# Patient Record
Sex: Male | Born: 1950 | Race: Black or African American | Hispanic: No | Smoking: Current some day smoker
Health system: Southern US, Community
[De-identification: ages and names within clinical notes are randomized; demographics above are authoritative.]

## PROBLEM LIST (undated history)

## (undated) DIAGNOSIS — G629 Polyneuropathy, unspecified: Secondary | ICD-10-CM

## (undated) DIAGNOSIS — D472 Monoclonal gammopathy: Secondary | ICD-10-CM

## (undated) DIAGNOSIS — K219 Gastro-esophageal reflux disease without esophagitis: Secondary | ICD-10-CM

## (undated) DIAGNOSIS — K759 Inflammatory liver disease, unspecified: Secondary | ICD-10-CM

## (undated) DIAGNOSIS — I10 Essential (primary) hypertension: Secondary | ICD-10-CM

## (undated) DIAGNOSIS — T4145XA Adverse effect of unspecified anesthetic, initial encounter: Secondary | ICD-10-CM

## (undated) DIAGNOSIS — F32A Depression, unspecified: Secondary | ICD-10-CM

## (undated) DIAGNOSIS — J449 Chronic obstructive pulmonary disease, unspecified: Secondary | ICD-10-CM

## (undated) DIAGNOSIS — E7800 Pure hypercholesterolemia, unspecified: Secondary | ICD-10-CM

## (undated) DIAGNOSIS — D649 Anemia, unspecified: Secondary | ICD-10-CM

## (undated) DIAGNOSIS — M1990 Unspecified osteoarthritis, unspecified site: Secondary | ICD-10-CM

## (undated) DIAGNOSIS — F329 Major depressive disorder, single episode, unspecified: Secondary | ICD-10-CM

## (undated) DIAGNOSIS — I82409 Acute embolism and thrombosis of unspecified deep veins of unspecified lower extremity: Secondary | ICD-10-CM

## (undated) DIAGNOSIS — T8859XA Other complications of anesthesia, initial encounter: Secondary | ICD-10-CM

## (undated) HISTORY — DX: Pure hypercholesterolemia, unspecified: E78.00

## (undated) HISTORY — PX: TONSILLECTOMY: SUR1361

## (undated) HISTORY — DX: Essential (primary) hypertension: I10

## (undated) HISTORY — DX: Polyneuropathy, unspecified: G62.9

---

## 2007-06-15 ENCOUNTER — Emergency Department (HOSPITAL_COMMUNITY): Admission: EM | Admit: 2007-06-15 | Discharge: 2007-06-15 | Payer: Self-pay | Admitting: Emergency Medicine

## 2007-08-16 ENCOUNTER — Ambulatory Visit: Payer: Self-pay | Admitting: Nurse Practitioner

## 2007-08-16 ENCOUNTER — Ambulatory Visit: Payer: Self-pay | Admitting: *Deleted

## 2007-08-16 DIAGNOSIS — F1021 Alcohol dependence, in remission: Secondary | ICD-10-CM

## 2007-08-16 DIAGNOSIS — G5790 Unspecified mononeuropathy of unspecified lower limb: Secondary | ICD-10-CM | POA: Insufficient documentation

## 2007-08-16 DIAGNOSIS — L84 Corns and callosities: Secondary | ICD-10-CM

## 2007-08-16 DIAGNOSIS — F17200 Nicotine dependence, unspecified, uncomplicated: Secondary | ICD-10-CM | POA: Insufficient documentation

## 2007-09-16 ENCOUNTER — Ambulatory Visit: Payer: Self-pay | Admitting: Nurse Practitioner

## 2007-10-04 ENCOUNTER — Ambulatory Visit: Payer: Self-pay | Admitting: Nurse Practitioner

## 2007-10-15 ENCOUNTER — Ambulatory Visit: Payer: Self-pay | Admitting: Nurse Practitioner

## 2007-10-15 DIAGNOSIS — F528 Other sexual dysfunction not due to a substance or known physiological condition: Secondary | ICD-10-CM | POA: Insufficient documentation

## 2008-01-13 ENCOUNTER — Ambulatory Visit: Payer: Self-pay | Admitting: Nurse Practitioner

## 2008-01-13 DIAGNOSIS — R609 Edema, unspecified: Secondary | ICD-10-CM | POA: Insufficient documentation

## 2008-04-19 ENCOUNTER — Encounter (INDEPENDENT_AMBULATORY_CARE_PROVIDER_SITE_OTHER): Payer: Self-pay | Admitting: Nurse Practitioner

## 2008-05-30 ENCOUNTER — Ambulatory Visit: Payer: Self-pay | Admitting: Nurse Practitioner

## 2008-07-13 ENCOUNTER — Encounter (INDEPENDENT_AMBULATORY_CARE_PROVIDER_SITE_OTHER): Payer: Self-pay | Admitting: Nurse Practitioner

## 2008-08-03 ENCOUNTER — Telehealth (INDEPENDENT_AMBULATORY_CARE_PROVIDER_SITE_OTHER): Payer: Self-pay | Admitting: Nurse Practitioner

## 2008-08-18 ENCOUNTER — Ambulatory Visit: Payer: Self-pay | Admitting: Nurse Practitioner

## 2008-08-18 DIAGNOSIS — T148XXA Other injury of unspecified body region, initial encounter: Secondary | ICD-10-CM | POA: Insufficient documentation

## 2008-08-30 ENCOUNTER — Ambulatory Visit: Payer: Self-pay | Admitting: Nurse Practitioner

## 2008-09-05 ENCOUNTER — Encounter (INDEPENDENT_AMBULATORY_CARE_PROVIDER_SITE_OTHER): Payer: Self-pay | Admitting: Nurse Practitioner

## 2008-09-05 DIAGNOSIS — B182 Chronic viral hepatitis C: Secondary | ICD-10-CM

## 2008-09-05 LAB — CONVERTED CEMR LAB
ALT: 24 units/L (ref 0–53)
Albumin: 4.3 g/dL (ref 3.5–5.2)
Basophils Absolute: 0 10*3/uL (ref 0.0–0.1)
CO2: 26 meq/L (ref 19–32)
Cholesterol: 177 mg/dL (ref 0–200)
Creatinine, Ser: 1.29 mg/dL (ref 0.40–1.50)
Glucose, Bld: 93 mg/dL (ref 70–99)
HCT: 42.6 % (ref 39.0–52.0)
HCV Ab: REACTIVE — AB
Hep B Core Total Ab: POSITIVE — AB
Hep B E Ab: NEGATIVE
Hep B S Ab: NEGATIVE
Lymphs Abs: 2.3 10*3/uL (ref 0.7–4.0)
MCHC: 32.9 g/dL (ref 30.0–36.0)
Monocytes Absolute: 0.4 10*3/uL (ref 0.1–1.0)
Neutro Abs: 2.3 10*3/uL (ref 1.7–7.7)
Neutrophils Relative %: 44 % (ref 43–77)
Platelets: 227 10*3/uL (ref 150–400)
Potassium: 4.4 meq/L (ref 3.5–5.3)
RBC: 4.7 M/uL (ref 4.22–5.81)
RDW: 13.8 % (ref 11.5–15.5)
Total Bilirubin: 1.1 mg/dL (ref 0.3–1.2)
Total Protein: 7 g/dL (ref 6.0–8.3)

## 2008-09-20 ENCOUNTER — Ambulatory Visit: Payer: Self-pay | Admitting: Nurse Practitioner

## 2008-09-21 ENCOUNTER — Encounter (INDEPENDENT_AMBULATORY_CARE_PROVIDER_SITE_OTHER): Payer: Self-pay | Admitting: Nurse Practitioner

## 2008-09-26 ENCOUNTER — Ambulatory Visit (HOSPITAL_COMMUNITY): Admission: RE | Admit: 2008-09-26 | Discharge: 2008-09-26 | Payer: Self-pay | Admitting: Family Medicine

## 2008-09-26 ENCOUNTER — Encounter (INDEPENDENT_AMBULATORY_CARE_PROVIDER_SITE_OTHER): Payer: Self-pay | Admitting: Nurse Practitioner

## 2008-09-27 ENCOUNTER — Encounter (INDEPENDENT_AMBULATORY_CARE_PROVIDER_SITE_OTHER): Payer: Self-pay | Admitting: Nurse Practitioner

## 2008-09-27 LAB — CONVERTED CEMR LAB: HCV Quantitative: 5890000 intl units/mL — ABNORMAL HIGH (ref <43)

## 2008-10-06 ENCOUNTER — Encounter (INDEPENDENT_AMBULATORY_CARE_PROVIDER_SITE_OTHER): Payer: Self-pay | Admitting: Nurse Practitioner

## 2008-10-26 ENCOUNTER — Encounter (INDEPENDENT_AMBULATORY_CARE_PROVIDER_SITE_OTHER): Payer: Self-pay | Admitting: Nurse Practitioner

## 2008-10-26 ENCOUNTER — Ambulatory Visit: Payer: Self-pay | Admitting: Gastroenterology

## 2008-11-09 ENCOUNTER — Encounter (INDEPENDENT_AMBULATORY_CARE_PROVIDER_SITE_OTHER): Payer: Self-pay | Admitting: Interventional Radiology

## 2008-11-09 ENCOUNTER — Ambulatory Visit (HOSPITAL_COMMUNITY): Admission: RE | Admit: 2008-11-09 | Discharge: 2008-11-09 | Payer: Self-pay | Admitting: Gastroenterology

## 2008-12-14 ENCOUNTER — Encounter (INDEPENDENT_AMBULATORY_CARE_PROVIDER_SITE_OTHER): Payer: Self-pay | Admitting: Nurse Practitioner

## 2008-12-14 ENCOUNTER — Ambulatory Visit: Payer: Self-pay | Admitting: Gastroenterology

## 2008-12-21 ENCOUNTER — Encounter (INDEPENDENT_AMBULATORY_CARE_PROVIDER_SITE_OTHER): Payer: Self-pay | Admitting: Nurse Practitioner

## 2009-01-26 ENCOUNTER — Ambulatory Visit: Payer: Self-pay | Admitting: Nurse Practitioner

## 2009-01-26 DIAGNOSIS — F329 Major depressive disorder, single episode, unspecified: Secondary | ICD-10-CM

## 2009-01-26 DIAGNOSIS — F3289 Other specified depressive episodes: Secondary | ICD-10-CM | POA: Insufficient documentation

## 2009-02-01 ENCOUNTER — Telehealth (INDEPENDENT_AMBULATORY_CARE_PROVIDER_SITE_OTHER): Payer: Self-pay | Admitting: *Deleted

## 2009-02-05 ENCOUNTER — Encounter (INDEPENDENT_AMBULATORY_CARE_PROVIDER_SITE_OTHER): Payer: Self-pay | Admitting: Nurse Practitioner

## 2009-06-22 ENCOUNTER — Ambulatory Visit: Payer: Self-pay | Admitting: Nurse Practitioner

## 2009-06-22 ENCOUNTER — Telehealth (INDEPENDENT_AMBULATORY_CARE_PROVIDER_SITE_OTHER): Payer: Self-pay | Admitting: Nurse Practitioner

## 2009-06-27 ENCOUNTER — Ambulatory Visit: Payer: Self-pay | Admitting: Nurse Practitioner

## 2009-06-27 DIAGNOSIS — M25569 Pain in unspecified knee: Secondary | ICD-10-CM | POA: Insufficient documentation

## 2009-06-27 DIAGNOSIS — R252 Cramp and spasm: Secondary | ICD-10-CM

## 2009-06-28 ENCOUNTER — Telehealth (INDEPENDENT_AMBULATORY_CARE_PROVIDER_SITE_OTHER): Payer: Self-pay | Admitting: Nurse Practitioner

## 2009-07-16 ENCOUNTER — Telehealth (INDEPENDENT_AMBULATORY_CARE_PROVIDER_SITE_OTHER): Payer: Self-pay | Admitting: *Deleted

## 2009-07-25 ENCOUNTER — Encounter (INDEPENDENT_AMBULATORY_CARE_PROVIDER_SITE_OTHER): Payer: Self-pay | Admitting: Nurse Practitioner

## 2009-10-26 ENCOUNTER — Emergency Department (HOSPITAL_COMMUNITY)
Admission: EM | Admit: 2009-10-26 | Discharge: 2009-10-26 | Payer: Self-pay | Source: Home / Self Care | Admitting: Family Medicine

## 2009-10-29 ENCOUNTER — Emergency Department (HOSPITAL_COMMUNITY): Admission: EM | Admit: 2009-10-29 | Discharge: 2009-10-29 | Payer: Self-pay | Admitting: Family Medicine

## 2009-10-31 ENCOUNTER — Encounter (HOSPITAL_BASED_OUTPATIENT_CLINIC_OR_DEPARTMENT_OTHER): Admission: RE | Admit: 2009-10-31 | Discharge: 2010-01-29 | Payer: Self-pay | Admitting: Internal Medicine

## 2009-11-02 ENCOUNTER — Ambulatory Visit (HOSPITAL_COMMUNITY): Admission: RE | Admit: 2009-11-02 | Discharge: 2009-11-02 | Payer: Self-pay | Admitting: Internal Medicine

## 2009-11-13 ENCOUNTER — Ambulatory Visit: Payer: Self-pay | Admitting: Vascular Surgery

## 2010-03-05 ENCOUNTER — Emergency Department (HOSPITAL_COMMUNITY): Admission: EM | Admit: 2010-03-05 | Discharge: 2010-03-05 | Payer: Self-pay | Admitting: Emergency Medicine

## 2010-05-09 ENCOUNTER — Telehealth (INDEPENDENT_AMBULATORY_CARE_PROVIDER_SITE_OTHER): Payer: Self-pay | Admitting: Nurse Practitioner

## 2010-05-09 ENCOUNTER — Ambulatory Visit: Payer: Self-pay | Admitting: Nurse Practitioner

## 2010-05-09 DIAGNOSIS — B351 Tinea unguium: Secondary | ICD-10-CM | POA: Insufficient documentation

## 2010-05-09 LAB — CONVERTED CEMR LAB
Bilirubin Urine: NEGATIVE
Glucose, Urine, Semiquant: NEGATIVE
Nitrite: NEGATIVE
OCCULT 1: NEGATIVE
Protein, U semiquant: NEGATIVE
Rapid HIV Screen: NEGATIVE

## 2010-05-13 LAB — CONVERTED CEMR LAB
ALT: 116 units/L — ABNORMAL HIGH (ref 0–53)
AST: 85 units/L — ABNORMAL HIGH (ref 0–37)
Albumin: 4.6 g/dL (ref 3.5–5.2)
BUN: 16 mg/dL (ref 6–23)
Basophils Absolute: 0 10*3/uL (ref 0.0–0.1)
Basophils Relative: 0 % (ref 0–1)
CO2: 28 meq/L (ref 19–32)
Creatinine, Ser: 1.28 mg/dL (ref 0.40–1.50)
Eosinophils Absolute: 0.4 10*3/uL (ref 0.0–0.7)
Eosinophils Relative: 5 % (ref 0–5)
HCT: 45.3 % (ref 39.0–52.0)
Lymphocytes Relative: 50 % — ABNORMAL HIGH (ref 12–46)
MCHC: 34.2 g/dL (ref 30.0–36.0)
MCV: 89.5 fL (ref 78.0–100.0)
Monocytes Relative: 7 % (ref 3–12)
Neutrophils Relative %: 38 % — ABNORMAL LOW (ref 43–77)
PSA: 1.71 ng/mL (ref <=4.00)
Potassium: 4.1 meq/L (ref 3.5–5.3)
RDW: 14 % (ref 11.5–15.5)
Sodium: 143 meq/L (ref 135–145)

## 2010-05-20 ENCOUNTER — Telehealth (INDEPENDENT_AMBULATORY_CARE_PROVIDER_SITE_OTHER): Payer: Self-pay | Admitting: Nurse Practitioner

## 2010-05-21 ENCOUNTER — Ambulatory Visit: Payer: Self-pay | Admitting: Nurse Practitioner

## 2010-05-21 DIAGNOSIS — M25519 Pain in unspecified shoulder: Secondary | ICD-10-CM | POA: Insufficient documentation

## 2010-05-27 ENCOUNTER — Ambulatory Visit: Payer: Self-pay | Admitting: Nurse Practitioner

## 2010-05-27 DIAGNOSIS — K219 Gastro-esophageal reflux disease without esophagitis: Secondary | ICD-10-CM | POA: Insufficient documentation

## 2010-06-04 ENCOUNTER — Ambulatory Visit
Admission: RE | Admit: 2010-06-04 | Discharge: 2010-06-04 | Payer: Self-pay | Source: Home / Self Care | Attending: Nurse Practitioner | Admitting: Nurse Practitioner

## 2010-06-05 ENCOUNTER — Encounter
Admission: RE | Admit: 2010-06-05 | Discharge: 2010-07-09 | Payer: Self-pay | Source: Home / Self Care | Attending: Internal Medicine | Admitting: Internal Medicine

## 2010-06-05 ENCOUNTER — Encounter (INDEPENDENT_AMBULATORY_CARE_PROVIDER_SITE_OTHER): Payer: Self-pay | Admitting: Nurse Practitioner

## 2010-06-21 ENCOUNTER — Ambulatory Visit
Admission: RE | Admit: 2010-06-21 | Discharge: 2010-06-21 | Payer: Self-pay | Source: Home / Self Care | Attending: Nurse Practitioner | Admitting: Nurse Practitioner

## 2010-06-30 ENCOUNTER — Encounter: Payer: Self-pay | Admitting: Family Medicine

## 2010-07-03 ENCOUNTER — Encounter: Payer: Self-pay | Admitting: Physical Therapy

## 2010-07-07 LAB — CONVERTED CEMR LAB
ALT: 26 units/L (ref 0–53)
AST: 27 units/L (ref 0–37)
Albumin: 4.3 g/dL (ref 3.5–5.2)
BUN: 14 mg/dL (ref 6–23)
Basophils Absolute: 0 10*3/uL (ref 0.0–0.1)
Basophils Relative: 1 % (ref 0–1)
CO2: 26 meq/L (ref 19–32)
Chloride: 110 meq/L (ref 96–112)
Eosinophils Absolute: 0.5 10*3/uL (ref 0.0–0.7)
Eosinophils Relative: 8 % — ABNORMAL HIGH (ref 0–5)
Glucose, Bld: 94 mg/dL (ref 70–99)
Hemoglobin: 15.6 g/dL (ref 13.0–17.0)
Neutro Abs: 3.2 10*3/uL (ref 1.7–7.7)
Neutrophils Relative %: 52 % (ref 43–77)
PSA: 2.04 ng/mL (ref 0.10–4.00)
Potassium: 4.4 meq/L (ref 3.5–5.3)
Saturation Ratios: 37 % (ref 20–55)
Sodium: 145 meq/L (ref 135–145)
Total Bilirubin: 1.1 mg/dL (ref 0.3–1.2)
Triglycerides: 134 mg/dL (ref <150)
UIBC: 186 ug/dL

## 2010-07-10 NOTE — Progress Notes (Signed)
Summary: medication refills  Phone Note Call from Patient   Summary of Call: pt is asking if he can get a Rx for vit b6, vit b12, and a multivitamin. He says he takes daily and wanted to know if it would be cheaper to get a [place] pharmacy. Initial call taken by: Levon Hedger,  June 22, 2009 12:05 PM  Follow-up for Phone Call        Will send RX for multivitamin to [facility] pharmacy. He will still need to get the others over the counter, however the multivitamin should include a daily dose of vitamin B6 and 12.  Follow-up by: Lehman Prom FNP,  June 22, 2009 12:20 PM  Additional Follow-up for Phone Call Additional follow up Details #1::        Pt informed of above information. Additional Follow-up by: Levon Hedger,  June 26, 2009 3:35 PM    Prescriptions: MULTIVITAMINS   TABS (MULTIPLE VITAMIN) 1 tablet by mouth daily  #30 x 11   Entered and Authorized by:   Lehman Prom FNP   Signed by:   Lehman Prom FNP on 06/22/2009   Method used:   Faxed to ...       [facility] (retail)       [address] [place], [place]  [address]       Ph: [phone]       Fax: [phone]   RxID:   [id number]
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
## 2010-07-10 NOTE — Progress Notes (Signed)
Summary: Form  Phone Note Call from Patient Call back at Home Phone [phone]   Summary of Call: Mr. Birdsall stopped by today and dropped off a form for the provider to filled out ([facility] Michel Bickers FNP  Initial call taken by: Manon Hilding,  July 16, 2009 3:24 PM  Follow-up for Phone Call        PUT IN YOUR REFILL SLOT Follow-up by: Arta Bruce,  July 16, 2009 3:29 PM  Additional Follow-up for Phone Call Additional follow up Details #1::        notify pt that form is completed make copy for the chart original to pt or it can be faxed back Additional Follow-up by: Lehman Prom FNP,  July 24, 2009 3:43 PM    Additional Follow-up for Phone Call Additional follow up Details #2::    CALLED PT FOR PICK UP Follow-up by: Arta Bruce,  July 25, 2009 9:47 AM

## 2010-07-10 NOTE — Progress Notes (Signed)
Summary: requesting refill  Phone Note Call from Patient Call back at [phone]   Summary of Call: Thomas Mcneil calling stating that yesterday Thomas Mcneil didn't write anything for his sinus problems. He stated that he talked about it with provider but he realized he only had 2 rx's. He statest he uses [employer] and he can be reached at [phone]. Initial call taken by: Mikey College CMA,  June 28, 2009 9:27 AM  Follow-up for Phone Call        pt is absolutely correct. I forgot. Rx for allegra has been sent to the [facility] pharmacy advise pt to call to check when it will be ready Follow-up by: Lehman Prom FNP,  June 28, 2009 10:27 AM  Additional Follow-up for Phone Call Additional follow up Details #1::        pt informed of above information. Additional Follow-up by: Levon Hedger,  June 28, 2009 10:40 AM    New/Updated Medications: ALLEGRA 180 MG TABS (FEXOFENADINE HCL) One tablet by mouth daily for allergies Prescriptions: ALLEGRA 180 MG TABS (FEXOFENADINE HCL) One tablet by mouth daily for allergies  #30 x 3   Entered and Authorized by:   Lehman Prom FNP   Signed by:   Lehman Prom FNP on 06/28/2009   Method used:   Faxed to ...       [facility] (retail)       [address] [place], [place]  [address]       Ph: [phone]       Fax: [phone]   RxID:   [id number]

## 2010-07-10 NOTE — Letter (Signed)
Summary: MENTAL RESIDUAL FUNCTIONAL CAPACITY FORM  MENTAL RESIDUAL FUNCTIONAL CAPACITY FORM   Imported By: Arta Bruce 07/25/2009 09:34:57  _____________________________________________________________________  External Attachment:    Type:   Image     Comment:   External Document

## 2010-07-10 NOTE — Letter (Signed)
Summary: PHYSICAL RESIDUAL FUNCTIONAL CAPACITY FORM  PHYSICAL RESIDUAL FUNCTIONAL CAPACITY FORM   Imported By: Arta Bruce 07/25/2009 09:38:17  _____________________________________________________________________  External Attachment:    Type:   Image     Comment:   External Document

## 2010-07-10 NOTE — Progress Notes (Signed)
Summary: Referrals  Phone Note Outgoing Call   Summary of Call: Pt needs 3 referrals: -Podiatry -Medical Specialty -GI for colonscopy Printed referrals  Initial call taken by: Lehman Prom FNP,  May 09, 2010 4:31 PM  Follow-up for Phone Call        1. PODIATRY: I SCHEDULE AN APPT 05-16-10 @ 3:30PM [facility] MAYER  2.MEDICAL SPECIALTY SEND REFERRAL WAITING FOR AN APPT. 3. GI SEND REFERRAL TO [facility] THEY WILL CALL THE PT TO SCHEDULE AN APPT Follow-up by: Cheryll Dessert,  May 10, 2010 4:26 PM

## 2010-07-10 NOTE — Assessment & Plan Note (Signed)
Summary: Peripheral Neuropathy   Vital Signs:  Patient profile:   60 year old male Weight:      209.8 pounds BMI:     28.56 BSA:     2.18 Temp:     97.8 degrees F oral Pulse rate:   93 / minute Pulse rhythm:   regular Resp:     20 per minute BP sitting:   127 / 81  (left arm) Cuff size:   regular  Vitals Entered By: Levon Hedger (June 27, 2009 2:26 PM) CC: sinus issues having headaches....pain in joints and knees has alot of pain squating and climbing stairs, Depression Is Patient Diabetic? No Pain Assessment Patient in pain? no       Does patient need assistance? Functional Status Self care Ambulation Normal  Diabetic Foot Exam Foot Inspection Are the toenails long?                Yes Are the toenails thick?                Yes  Diabetic Foot Care Education Pulse Check          Right Foot          Left Foot Dorsalis Pedis:        diminished            diminished    Primary Care Provider:  Lehman Prom  CC:  sinus issues having headaches....pain in joints and knees has alot of pain squating and climbing stairs and Depression.  History of Present Illness:  Pt into the office for follow up.  Pt has some questions about his multivitamin.  He has been ETOH free from 1 year and 4 months.  He was started on B6 and B12 and has been taking it for 12 months.  He is also taking a multivitamin Tobacco use - quit about 6 weeks ago  Cramps increasing in frequency.  starts from buttocks down to his toes.  Usually only at night. Pain in both his legs that has been increasing.  Never with ambulation or in calf muscles  Knees - aches.  If he walks up steps or hills then his knees hurt. Weight gain of 10 pounds since his last visit which has increased the pain in his knees.  Allergy symptoms -  +sneezing +nasal congestions +slight headache (but needs an eye exam) Used nasal spray in the past without resolution Comments:  still going to [facility].    Habits & Providers  Alcohol-Tobacco-Diet     Alcohol drinks/day: <1     Alcohol Counseling: to STOP drinking     Alcohol type: alcohol     Tobacco Status: quit < 6 months     Tobacco Counseling: to quit use of tobacco products  Exercise-Depression-Behavior     Does Patient Exercise: no     Type of exercise: ride bike     Drug Use: no     Sun Exposure: occasionally  Medications Prior to Update: 1)  Neurontin 300 Mg  Caps (Gabapentin) .... 2 Tablet By Mouth Two Times A Day 2)  Vitamin B-6 100 Mg  Tabs (Pyridoxine Hcl) .Marland Kitchen.. 1 Tablet By Mouth Daily 3)  Multivitamins   Tabs (Multiple Vitamin) .Marland Kitchen.. 1 Tablet By Mouth Daily 4)  Aspirin Ec Lo-Dose 81 Mg  Tbec (Aspirin) .Marland Kitchen.. 1 Tablet By Mouth Daily For Circulation 5)  Viagra 100 Mg  Tabs (Sildenafil Citrate) .Marland Kitchen.. 1 Tablet By Mouth 30 Minutes To Sexual  Activity 6)  Ultram 50 Mg  Tabs (Tramadol Hcl) .Marland Kitchen.. 1 Tablet By Mouth Two Times A Day As Needed For Pain 7)  Lexapro 10 Mg Tabs (Escitalopram Oxalate) .Marland Kitchen.. 1 Tablet By Mouth Daily  **rx By [facility]** 8)  Trazodone Hcl 100 Mg Tabs (Trazodone Hcl) .... One Tablet By Mouth At Bedtime **rx By [facility]**  Allergies (verified): No Known Drug Allergies  Social History: Smoking Status:  quit < 6 months  Review of Systems General:  Denies fever. ENT:  Complains of postnasal drainage. CV:  Complains of swelling of feet; denies chest pain or discomfort, leg cramps with exertion, and palpitations. Resp:  Denies cough. Neuro:  Complains of headaches.  Physical Exam  General:  alert.   Eyes:  glasses Lungs:  normal breath sounds.   Heart:  normal rate and regular rhythm.   Neurologic:  alert & oriented X3.     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 will continue gabapentin start lortab as needed   Problem # 2:  KNEE PAIN, BILATERAL  (ICD-719.46) anti-inflammatories. also increased due to weight gain The following medications were removed from the medication list:    Ultram 50 Mg Tabs (Tramadol hcl) .Marland Kitchen... 1 tablet by mouth two times a day as needed for pain His updated medication list for this problem includes:    Aspirin Ec Lo-dose 81 Mg Tbec (Aspirin) .Marland Kitchen... 1 tablet by mouth daily for circulation    Diclofenac Sodium 75 Mg Tbec (Diclofenac sodium) ..... One tablet by mouth two times a day for knees *take with food**    Lortab 5 5-500 Mg Tabs (Hydrocodone-acetaminophen) ..... One tablet by mouth nighty as needed for leg pain  Problem # 3:  Hx of TOBACCO ABUSE (ICD-305.1) congrats on cessation  Complete Medication List: 1)  Neurontin 300 Mg Caps (Gabapentin) .... 2 tablet by mouth two times a day 2)  Vitamin B-6 100 Mg Tabs (Pyridoxine hcl) .Marland Kitchen.. 1 tablet by mouth daily 3)  Multivitamins Tabs (Multiple vitamin) .Marland Kitchen.. 1 tablet by mouth daily 4)  Aspirin Ec Lo-dose 81 Mg Tbec (Aspirin) .Marland Kitchen.. 1 tablet by mouth daily for circulation 5)  Viagra 100 Mg Tabs (Sildenafil citrate) .Marland Kitchen.. 1 tablet by mouth 30 minutes to sexual activity 6)  Lexapro 10 Mg Tabs (Escitalopram oxalate) .Marland Kitchen.. 1 tablet by mouth daily  **rx by [facility]** 7)  Trazodone Hcl 100 Mg Tabs (Trazodone hcl) .... One tablet by mouth at bedtime **rx by [facility]** 8)  Diclofenac Sodium 75 Mg Tbec (Diclofenac sodium) .... One tablet by mouth two times a day for knees *take with food** 9)  Lortab 5 5-500 Mg Tabs (Hydrocodone-acetaminophen) .... One tablet by mouth nighty as needed for leg pain  Patient Instructions: 1)  Congrats on quitting smoking. 2)  [facility] [place] [phone] OR 3)  [facility] [phone] 4)  Knees - arthritis.  Start diclofenac 75mg  by mouth two times a day for knees.  Take with food. 5)  Neuropathy - continue to take the gabapentin at night. 6)  May take lortab as needed for pain (extreme) Prescriptions: DICLOFENAC  SODIUM 75 MG TBEC (DICLOFENAC SODIUM) One tablet by mouth two times a day for knees *Take with food**  #60 x 3   Entered and Authorized by:   Lehman Prom FNP   Signed by:   Lehman Prom FNP on 06/27/2009   Method used:  Print then Give to Patient   RxID:   [id number] LORTAB 5 5-500 MG TABS (HYDROCODONE-ACETAMINOPHEN) One tablet by mouth nighty as needed for leg pain  #30 x 0   Entered and Authorized by:   Lehman Prom FNP   Signed by:   Lehman Prom FNP on 06/27/2009   Method used:   Print then Give to Patient   RxID:   [id number]

## 2010-07-11 ENCOUNTER — Ambulatory Visit: Payer: Self-pay | Admitting: Physical Therapy
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
## 2010-07-11 NOTE — Assessment & Plan Note (Signed)
Summary: assess abdominal pain//[facility]  Nurse Visit   Vital Signs:  Patient profile:   60 year old male Weight:      200.2 pounds Temp:     97.1 degrees F oral Pulse rate:   72 / minute Pulse rhythm:   regular Resp:     20 per minute BP sitting:   146 / 84  (left arm) Cuff size:   regular  Vitals Entered By: Dutch Quint RN (June 04, 2010 9:17 AM)  Patient Instructions: 1)  Continue taking medications and following diet for your reflux. 2)  It's good that you've stopped drinking. 3)  Return in two weeks to see triage nurse to recheck abdominal pain. 4)  Keep schedule appointment in March with your Jeilyn Reznik. 5)  Call if anything changes of if you have any questions.   Primary Care Jahlil Ziller:  Lehman Prom  CC:  reassess abdominal pain.  History of Present Illness: 05/27/10 - f/u abdominal pain.  dx with GERD, started on protonix.  Started taking protonix the same day, has been taking daily since.  Has been effective.  No reoccurrence, has been following diet. States has stopped drinking.   Review of Systems       States he is always tired, energy level is down. GI:  States asymptomatic.Marland Kitchen   Physical Exam  General:  alert, well-developed, well-nourished, and well-hydrated.     Impression & Recommendations:  Problem # 1:  GERD (ICD-530.81) No further c/o abdominal pain Has stopped drinking Taking protonix Return in 2 weeks with triage nurse to reassess abdominal pain  His updated medication list for this problem includes:    Pantoprazole Sodium 40 Mg Tbec (Pantoprazole sodium) ..... One tablet by mouth daily before breakfast  Complete Medication List: 1)  Neurontin 300 Mg Caps (Gabapentin) .... 2 tablet by mouth two times a day 2)  Vitamin B-6 100 Mg Tabs (Pyridoxine hcl) .Marland Kitchen.. 1 tablet by mouth daily 3)  Multivitamins Tabs (Multiple vitamin) .Marland Kitchen.. 1 tablet by mouth daily 4)  Aspirin Ec Lo-dose 81 Mg Tbec (Aspirin) .Marland Kitchen.. 1 tablet by mouth daily for  circulation 5)  Lexapro 10 Mg Tabs (Escitalopram oxalate) .Marland Kitchen.. 1 tablet by mouth daily  **rx by [facility]** 6)  Trazodone Hcl 100 Mg Tabs (Trazodone hcl) .... One tablet by mouth at bedtime **rx by [facility]** 7)  Xyzal 5 Mg Tabs (Levocetirizine dihydrochloride) .... One tablet by mouth daily for allergies 8)  Naproxen 500 Mg Tabs (Naproxen) .... One tablet by mouth two times a day for pain 9)  Pantoprazole Sodium 40 Mg Tbec (Pantoprazole sodium) .... One tablet by mouth daily before breakfast  CC: reassess abdominal pain Is Patient Diabetic? No Pain Assessment Patient in pain? no       Does patient need assistance? Functional Status Self care Ambulation Normal   Allergies: No Known Drug Allergies  Orders Added: 1)  Est. Patient Level I [04540]

## 2010-07-11 NOTE — Assessment & Plan Note (Signed)
Summary: Acute - Gastritis   Vital Signs:  Patient profile:   60 year old male Weight:      201.56 pounds Temp:     97.3 degrees F oral Pulse rate:   80 / minute Pulse rhythm:   regular Resp:     16 per minute BP sitting:   138 / 84  (left arm) Cuff size:   regular  Vitals Entered By: Hale Drone CMA (May 27, 2010 3:38 PM) CC: Here for a f/u from [facility] urgent care. Stomach pains. Was diagnosed w/gastroenteritis. Out of meds. and would like more. , Abdominal Pain, Depression Is Patient Diabetic? No Pain Assessment Patient in pain? yes     Location: abdomen Intensity: 2 Type: dull/aching Onset of pain  Intermittent  Does patient need assistance? Functional Status Self care Ambulation Normal   Primary Care Provider:  Lehman Prom  CC:  Here for a f/u from [facility] urgent care. Stomach pains. Was diagnosed w/gastroenteritis. Out of meds. and would like more. , Abdominal Pain, and Depression.  History of Present Illness:  Pt into the office with c/o abdominal pain Reports that he started drinking a few month back (which he admitted to on last visit) Pt has since quit drinking He was given meds from Urgent care but he does not know the name of the medication.    The patient denies that he feels like life is not worth living, denies that he wishes that he were dead, and denies that he has thought about ending his life.         Dyspepsia History:      He has no alarm features of dyspepsia including no history of melena, hematochezia, dysphagia, persistent vomiting, or involuntary weight loss > 5%.  There is no prior history of GERD.  The patient does not have a prior history of documented ulcer disease.  The dominant symptom is heartburn or acid reflux.  An H-2 blocker medication is not currently being taken.  He has no history of a positive H. Pylori serology.  No previous upper endoscopy has been done.      Habits & Providers  Alcohol-Tobacco-Diet     Alcohol drinks/day:  <1     Alcohol Counseling: to STOP drinking     Alcohol type: alcohol     Tobacco Status: quit < 6 months     Tobacco Counseling: to quit use of tobacco products  Exercise-Depression-Behavior     Does Patient Exercise: no     Type of exercise: ride bike     Have you felt down or hopeless? yes     Have you felt little pleasure in things? yes     Depression Counseling: further diagnostic testing and/or other treatment is indicated     Drug Use: no     Sun Exposure: occasionally  Current Medications (verified): 1)  Neurontin 300 Mg  Caps (Gabapentin) .... 2 Tablet By Mouth Two Times A Day 2)  Vitamin B-6 100 Mg  Tabs (Pyridoxine Hcl) .Marland Kitchen.. 1 Tablet By Mouth Daily 3)  Multivitamins   Tabs (Multiple Vitamin) .Marland Kitchen.. 1 Tablet By Mouth Daily 4)  Aspirin Ec Lo-Dose 81 Mg  Tbec (Aspirin) .Marland Kitchen.. 1 Tablet By Mouth Daily For Circulation 5)  Lexapro 10 Mg Tabs (Escitalopram Oxalate) .Marland Kitchen.. 1 Tablet By Mouth Daily  **rx By [facility]** 6)  Trazodone Hcl 100 Mg Tabs (Trazodone Hcl) .... One Tablet By Mouth At Bedtime **rx By [facility]** 7)  Xyzal 5 Mg Tabs (  Levocetirizine Dihydrochloride) .... One Tablet By Mouth Daily For Allergies 8)  Naproxen 500 Mg Tabs (Naproxen) .... One Tablet By Mouth Two Times A Day For Pain  Allergies (verified): No Known Drug Allergies  Review of Systems CV:  Denies chest pain or discomfort. Resp:  Denies cough. GI:  Complains of abdominal pain and indigestion; denies nausea and vomiting.  Physical Exam  General:  alert.   Head:  normocephalic.   Eyes:  glasses Lungs:  normal breath sounds.   Heart:  normal rate and regular rhythm.   Abdomen:  midepigastric tenderness Msk:  normal ROM.   Neurologic:  alert & oriented X3.     Impression & Recommendations:  Problem # 1:  GERD (ICD-530.81)  handout given to pt will start protonix  His updated medication list for this problem includes:    Pantoprazole Sodium 40 Mg Tbec (Pantoprazole sodium) .....  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 He went 3 years sober and recently had a relapse but pt acknowledges the need for cessation  Complete Medication List: 1)  Neurontin 300 Mg Caps (Gabapentin) .... 2 tablet by mouth two times a day 2)  Vitamin B-6 100 Mg Tabs (Pyridoxine hcl) .Marland Kitchen.. 1 tablet by mouth daily 3)  Multivitamins Tabs (Multiple vitamin) .Marland Kitchen.. 1 tablet by mouth daily 4)  Aspirin Ec Lo-dose 81 Mg Tbec (Aspirin) .Marland Kitchen.. 1 tablet by mouth daily for circulation 5)  Lexapro 10 Mg Tabs (Escitalopram oxalate) .Marland Kitchen.. 1 tablet by mouth daily  **rx by [facility]** 6)  Trazodone Hcl 100 Mg Tabs (Trazodone hcl) .... One tablet by mouth at bedtime **rx by [facility]** 7)  Xyzal 5 Mg Tabs (Levocetirizine dihydrochloride) .... One tablet by mouth daily for allergies 8)  Naproxen 500 Mg Tabs (Naproxen) .... One tablet by mouth two times a day for pain 9)  Pantoprazole Sodium 40 Mg Tbec (Pantoprazole sodium) .... One tablet by mouth daily before breakfast  Patient Instructions: 1)  Read the handout to find out foods to avoid 2)  Start the protonix 40mg  by mouth daily before breakfast. 3)  Take this medication before food or your other medications. 4)  Keep your appointment for therapy on December 28th at 1:30PM 5)  Follow up in 1 week with Triage nurse - assess abdominal pain. Prescriptions: PANTOPRAZOLE SODIUM 40 MG TBEC (PANTOPRAZOLE SODIUM) One tablet by mouth daily before breakfast  #30 x 3   Entered and Authorized by:   Lehman Prom FNP   Signed by:   Lehman Prom FNP on 05/27/2010   Method used:   Print then Give to Patient   RxID:   [id number] PANTOPRAZOLE SODIUM 40 MG TBEC (PANTOPRAZOLE SODIUM) One tablet by mouth daily before breakfast  #30 x 3   Entered and Authorized by:   Lehman Prom FNP   Signed by:   Lehman Prom FNP on 05/27/2010   Method  used:   Print then Give to Patient   RxID:   [id number]    Orders Added: 1)  Est. Patient Level III [95284]

## 2010-07-11 NOTE — Assessment & Plan Note (Signed)
Summary: 2 WEEK FU FOR ABDOMIONAL PAIN/KT  Nurse Visit   Vital Signs:  Patient profile:   60 year old male Weight:      192.3 pounds Temp:     98.3 degrees F oral Pulse rate:   96 / minute Pulse rhythm:   regular Resp:     20 per minute BP sitting:   118 / 80  (left arm) Cuff size:   regular  Vitals Entered By: Dutch Quint RN (June 21, 2010 2:25 PM)  Patient Instructions: 1)  Continue to take protonix as ordered - glad you're doing so well. 2)  Continue monitoring diet and lifestyle changes. 3)  Return in one month for follow-up with triage nurse to assess your stomach pains. 4)  Call if anything changes or if you have any questions.  CC: f/u stomach pain Is Patient Diabetic? No Pain Assessment Patient in pain? no       Does patient need assistance? Functional Status Self care Ambulation Normal   Primary Care Provider:  Lehman Prom  CC:  f/u stomach pain.  History of Present Illness: 12/19 dx with GERD, started protonix, f/u 12/27, was doing well, had stopped drinking.  Continuing protonix as ordered, states no more stomach/abdominal pain.  Smokes cigars once in a while, cut back on junk food, trying to avoid spicy foods, no caffeine.  Not drinking.     Review of Systems GI:  States asymptomatic, denies reflux.Marland Kitchen   Physical Exam  General:  alert, well-developed, well-nourished, and well-hydrated.  Has lost 8 lbs. since last visit.   Impression & Recommendations:  Problem # 1:  GERD (ICD-530.81) Denies pain Still not drinking Monitoring diet, limiting contributing foods/liquids Has lost 8 lbs. Return in one month with triage nurse for reassessment.  His updated medication list for this problem includes:    Pantoprazole Sodium 40 Mg Tbec (Pantoprazole sodium) ..... One tablet by mouth daily before breakfast  Complete Medication List: 1)  Neurontin 300 Mg Caps (Gabapentin) .... 2 tablet by mouth two times a day 2)  Vitamin B-6 100 Mg Tabs  (Pyridoxine hcl) .Marland Kitchen.. 1 tablet by mouth daily 3)  Multivitamins Tabs (Multiple vitamin) .Marland Kitchen.. 1 tablet by mouth daily 4)  Aspirin Ec Lo-dose 81 Mg Tbec (Aspirin) .Marland Kitchen.. 1 tablet by mouth daily for circulation 5)  Lexapro 10 Mg Tabs (Escitalopram oxalate) .Marland Kitchen.. 1 tablet by mouth daily  **rx by [facility]** 6)  Trazodone Hcl 100 Mg Tabs (Trazodone hcl) .... One tablet by mouth at bedtime **rx by [facility]** 7)  Xyzal 5 Mg Tabs (Levocetirizine dihydrochloride) .... One tablet by mouth daily for allergies 8)  Naproxen 500 Mg Tabs (Naproxen) .... One tablet by mouth two times a day for pain 9)  Pantoprazole Sodium 40 Mg Tbec (Pantoprazole sodium) .... One tablet by mouth daily before breakfast   Allergies: No Known Drug Allergies  Orders Added: 1)  Est. Patient Level I [56213]

## 2010-07-11 NOTE — Letter (Signed)
Summary: Handout Printed  Printed Handout:  - Tendinitis 

## 2010-07-11 NOTE — Progress Notes (Signed)
Summary: need to speak with you  Phone Note Call from Patient   Summary of Call: please call patient he is calling today because his left shoulder got in a lot of pain he ask for an appointment but we don't have availiable appointment he is asking to see [facility] soon. please call him at [phone] Initial call taken by: Domenic Polite,  May 20, 2010 2:20 PM  Follow-up for Phone Call        Thomas Mcneil CALLED THIS MORNING AND GOT AN APPT TO COME IN TODAY. Follow-up by: Leodis Rains,  May 21, 2010 10:08 AM

## 2010-07-11 NOTE — Miscellaneous (Signed)
Summary: Rehab Report//INITIAL SUMMARY  Rehab Report//INITIAL SUMMARY   Imported By: Arta Bruce 06/21/2010 10:27:14  _____________________________________________________________________  External Attachment:    Type:   Image     Comment:   External Document

## 2010-07-15 ENCOUNTER — Encounter: Payer: Self-pay | Admitting: Physical Therapy

## 2010-07-23 ENCOUNTER — Encounter (INDEPENDENT_AMBULATORY_CARE_PROVIDER_SITE_OTHER): Payer: Self-pay | Admitting: Nurse Practitioner

## 2010-07-23 ENCOUNTER — Encounter: Payer: Self-pay | Admitting: Nurse Practitioner

## 2010-07-30 NOTE — Progress Notes (Signed)
  NAME:  Thomas Mcneil, Thomas Mcneil                 ACCOUNT NO.:  [id number]  MEDICAL RECORD NO.:  [id number]          PATIENT TYPE:  REC  LOCATION:  [facility]                         FACILITY:  [facility]  PHYSICIAN:  Ifeanyichukwu Wickham G. Renard Matter, MD   DATE OF BIRTH:  09/25/1950  DATE OF PROCEDURE:  07/22/2010 DATE OF DISCHARGE:  07/09/2010                                PROGRESS NOTE   SUBJECTIVE:  This patient was admitted with exacerbation of COPD.  He remains on BiPAP.  He is on antibiotics and steroids and inhaled bronchodilators.  OBJECTIVE:  VITAL SIGNS:  Blood pressure 143/69, respirations 26, pulse 86, temperature 97.0.  Current blood gases:  PH 7.312 with a pCO2 of 72.2, and pO2 60.7.  LUNGS:  Rhonchi bilaterally.  HEART:  Regular rhythm.  ABDOMEN:  No palpable organs or masses.  ASSESSMENT:  The patient was admitted to the hospital with exacerbation of chronic obstructive pulmonary disease.  Does have type 2 diabetes, acute maxillary sinusitis, and hyperlipidemia.  PLAN:  To continue current regimen.  The patient to remain on BiPAP. Continue IV steroids, Rocephin, and Zithromax intravenously.     Elmire Amrein G. Renard Matter, MD     AGM/MEDQ  D:  07/22/2010  T:  07/22/2010  Job:  [id number]  Electronically Signed by Butch Penny MD on 07/30/2010 06:42:13 AM

## 2010-07-31 NOTE — Assessment & Plan Note (Signed)
Summary: Assess stomach pain  Nurse Visit   Vital Signs:  Patient profile:   60 year old male Weight:      195.7 pounds Temp:     98.3 degrees F oral Pulse rate:   68 / minute Pulse rhythm:   regular Resp:     24 per minute BP sitting:   130 / 82  (left arm) Cuff size:   regular  Vitals Entered By: Dutch Quint RN (July 23, 2010 2:12 PM) CC: Reassess stomach/abdominal pain Is Patient Diabetic? No Pain Assessment Patient in pain? no       Does patient need assistance? Functional Status Self care Ambulation Normal   Primary Care Provider:  Lehman Prom  CC:  Reassess stomach/abdominal pain.  History of Present Illness: 07/09/10  BP 118/80  P96,  Came to have reassess of stomach pain s/p taking Protonix and change in lifestyle and diet.  Had stopped drinking, pain had gone.  Here to monitor changes, f/u pain.    Continuing with Protonix daily, still not drinking, states no further pain until he ran out of medication this month.  Had not taken for about a week, then restarted med and pain resolved.   Physical Exam  General:  alert, well-developed, well-nourished, and well-hydrated.     Review of Systems GI:  Denies nausea, vomiting, stomach/abdominal pain when on medication.  Denies change in bowel habits or in stools..   Patient Instructions: 1)  Continue taking medication as ordered -- make sure you call for refills in time to avoid running out again. 2)  Continue with your positive lifestyle and diet changes -- it's obviously working for you! 3)  Keep scheduled appointment with provider next month -- we will be  at the [facility]. location then. 4)  Call if anything changes or if you have any questions.   Impression & Recommendations:  Problem # 1:  GERD (ICD-530.81) Continuing med -- ran out briefly, pain returned, resolved after restarting Still not drinking, continuing positive lifestyle changes Keep f/u appt. with provider  His updated medication  list for this problem includes:    Pantoprazole Sodium 40 Mg Tbec (Pantoprazole sodium) ..... One tablet by mouth daily before breakfast  Complete Medication List: 1)  Neurontin 300 Mg Caps (Gabapentin) .... 2 tablet by mouth two times a day 2)  Vitamin B-6 100 Mg Tabs (Pyridoxine hcl) .Marland Kitchen.. 1 tablet by mouth daily 3)  Multivitamins Tabs (Multiple vitamin) .Marland Kitchen.. 1 tablet by mouth daily 4)  Aspirin Ec Lo-dose 81 Mg Tbec (Aspirin) .Marland Kitchen.. 1 tablet by mouth daily for circulation 5)  Lexapro 10 Mg Tabs (Escitalopram oxalate) .Marland Kitchen.. 1 tablet by mouth daily  **rx by [facility]** 6)  Trazodone Hcl 100 Mg Tabs (Trazodone hcl) .... One tablet by mouth at bedtime **rx by [facility]** 7)  Xyzal 5 Mg Tabs (Levocetirizine dihydrochloride) .... One tablet by mouth daily for allergies 8)  Naproxen 500 Mg Tabs (Naproxen) .... One tablet by mouth two times a day for pain 9)  Pantoprazole Sodium 40 Mg Tbec (Pantoprazole sodium) .... One tablet by mouth daily before breakfast   Allergies: No Known Drug Allergies  Orders Added: 1)  Est. Patient Level I [16109]

## 2010-08-09 ENCOUNTER — Telehealth (INDEPENDENT_AMBULATORY_CARE_PROVIDER_SITE_OTHER): Payer: Self-pay | Admitting: Nurse Practitioner

## 2010-08-16 ENCOUNTER — Encounter: Payer: Self-pay | Admitting: Nurse Practitioner

## 2010-08-16 ENCOUNTER — Encounter (INDEPENDENT_AMBULATORY_CARE_PROVIDER_SITE_OTHER): Payer: Self-pay | Admitting: Nurse Practitioner

## 2010-08-20 NOTE — Assessment & Plan Note (Signed)
Summary: GERD   Vital Signs:  Patient profile:   60 year old male Weight:      201.0 pounds Temp:     98.1 degrees F oral Pulse rate:   90 / minute Pulse rhythm:   regular Resp:     20 per minute BP sitting:   140 / 85  (left arm) Cuff size:   regular  Vitals Entered By: Levon Hedger (August 16, 2010 10:51 AM) CC: follow-up visit...pt states he needs treatment for Hep C , he never got in touch with [facility]. Is Patient Diabetic? No Pain Assessment Patient in pain? no       Does patient need assistance? Functional Status Self care Ambulation Normal   Primary Care Provider:  Lehman Prom  CC:  follow-up visit...pt states he needs treatment for Hep C  and he never got in touch with [facility].Marland Kitchen  History of Present Illness:  Pt into the office for routine f/u  Colonscopy - scheduled for next week. He already has the prep  Hepatitis C - pt had referral to Medical Special[facility] but he has not been contacted.   All medications present today during the visit.  No acute problems today  Habits & Providers  Alcohol-Tobacco-Diet     Alcohol drinks/day: <1     Alcohol Counseling: to STOP drinking     Alcohol type: alcohol     Tobacco Status: quit < 6 months     Tobacco Counseling: to quit use of tobacco products  Exercise-Depression-Behavior     Does Patient Exercise: no     Type of exercise: ride bike     Depression Counseling: further diagnostic testing and/or other treatment is indicated     Drug Use: no     Sun Exposure: occasionally  Comments: Pt is still going through treatment and is a program for alcohol  Diabetic Foot Exam Foot Inspection Is there a history of a foot ulcer?              No Is there a foot ulcer now?              No Can the patient see the bottom of their feet?          No Are the shoes appropriate in style and fit?          Yes Is there swelling or an abnormal foot shape?          No Are the toenails long?                Yes Are the toenails  thick?                Yes Are the toenails ingrown?              No Is there heavy callous build-up?              No Is there pain in the calf muscle (Intermittent claudication) when walking?    NoIs there a claw toe deformity?              No Is there elevated skin temperature?            No Is there limited ankle dorsiflexion?            No Is there foot or ankle muscle weakness?            No  Diabetic Foot Care Education Pulse Check  Right Foot          Left Foot Dorsalis Pedis:        normal            normal Comments: left great toe - s/p removal and is growing back  Medications Prior to Update: 1)  Neurontin 300 Mg  Caps (Gabapentin) .... 2 Tablet By Mouth Two Times A Day 2)  Vitamin B-6 100 Mg  Tabs (Pyridoxine Hcl) .Marland Kitchen.. 1 Tablet By Mouth Daily 3)  Multivitamins   Tabs (Multiple Vitamin) .Marland Kitchen.. 1 Tablet By Mouth Daily 4)  Aspirin Ec Lo-Dose 81 Mg  Tbec (Aspirin) .Marland Kitchen.. 1 Tablet By Mouth Daily For Circulation 5)  Lexapro 10 Mg Tabs (Escitalopram Oxalate) .Marland Kitchen.. 1 Tablet By Mouth Daily  **rx By [facility]** 6)  Trazodone Hcl 100 Mg Tabs (Trazodone Hcl) .... One Tablet By Mouth At Bedtime **rx By [facility]** 7)  Xyzal 5 Mg Tabs (Levocetirizine Dihydrochloride) .... One Tablet By Mouth Daily For Allergies 8)  Naproxen 500 Mg Tabs (Naproxen) .... One Tablet By Mouth Two Times A Day For Pain 9)  Pantoprazole Sodium 40 Mg Tbec (Pantoprazole Sodium) .... One Tablet By Mouth Daily Before Breakfast  Current Medications (verified): 1)  Neurontin 300 Mg  Caps (Gabapentin) .... 2 Tablet By Mouth Two Times A Day 2)  Vitamin B-6 100 Mg  Tabs (Pyridoxine Hcl) .Marland Kitchen.. 1 Tablet By Mouth Daily 3)  Multivitamins   Tabs (Multiple Vitamin) .Marland Kitchen.. 1 Tablet By Mouth Daily 4)  Aspirin Ec Lo-Dose 81 Mg  Tbec (Aspirin) .Marland Kitchen.. 1 Tablet By Mouth Daily For Circulation 5)  Lexapro 10 Mg Tabs (Escitalopram Oxalate) .Marland Kitchen.. 1 Tablet By Mouth Daily  **rx By [facility]** 6)  Trazodone Hcl 100 Mg  Tabs (Trazodone Hcl) .... One Tablet By Mouth At Bedtime **rx By [facility]** 7)  Xyzal 5 Mg Tabs (Levocetirizine Dihydrochloride) .... One Tablet By Mouth Daily For Allergies 8)  Naproxen 500 Mg Tabs (Naproxen) .... One Tablet By Mouth Two Times A Day For Pain 9)  Pantoprazole Sodium 40 Mg Tbec (Pantoprazole Sodium) .... One Tablet By Mouth Daily Before Breakfast  Allergies (verified): No Known Drug Allergies  Review of Systems General:  Denies fever. CV:  Denies chest pain or discomfort. Resp:  Denies cough. GI:  Denies abdominal pain, indigestion, nausea, and vomiting. Neuro:  Complains of tingling; bil feet - though stable with gabapentin.  Physical Exam  General:  alert.   Head:  normocephalic.   Ears:  ear piercing(s) noted.   Lungs:  normal breath sounds.   Heart:  normal rate and regular rhythm.   Abdomen:  normal bowel sounds.   Msk:  normal ROM.   up to the exam table Neurologic:  alert & oriented X3.   Skin:  color normal.   Psych:  Oriented X3.     Impression & Recommendations:  Problem # 1:  GERD (ICD-530.81) stable  now that pt has been off his protonix His updated medication list for this problem includes:    Pantoprazole Sodium 40 Mg Tbec (Pantoprazole sodium) ..... One tablet by mouth daily before breakfast  Problem # 2:  SCREENING, COLON CANCER (ICD-V76.51) pt will go for next week  Problem # 3:  PERIPHERAL NEUROPATHY, FEET (ICD-355.8) stable  pt to keep taking neurontin as ordered  Problem # 4:  HEPATITIS C (ICD-070.51) will f/u with [facility] and see where pt is in the referral process referral sent 05/2010  Problem # 5:  ALCOHOL ABUSE, HX OF (ICD-V11.3) advised pt continued cessation  Complete Medication List: 1)  Neurontin 300 Mg Caps (Gabapentin) .... 2 tablet by mouth two times a day 2)  Vitamin B-6 100 Mg Tabs (Pyridoxine hcl) .Marland Kitchen.. 1 tablet by mouth daily 3)  Multivitamins Tabs (Multiple vitamin) .Marland Kitchen.. 1 tablet by mouth daily 4)  Aspirin  Ec Lo-dose 81 Mg Tbec (Aspirin) .Marland Kitchen.. 1 tablet by mouth daily for circulation 5)  Lexapro 10 Mg Tabs (Escitalopram oxalate) .Marland Kitchen.. 1 tablet by mouth daily  **rx by [facility]** 6)  Trazodone Hcl 100 Mg Tabs (Trazodone hcl) .... One tablet by mouth at bedtime **rx by [facility]** 7)  Xyzal 5 Mg Tabs (Levocetirizine dihydrochloride) .... One tablet by mouth daily for allergies 8)  Naproxen 500 Mg Tabs (Naproxen) .... One tablet by mouth two times a day for pain 9)  Pantoprazole Sodium 40 Mg Tbec (Pantoprazole sodium) .... One tablet by mouth daily before breakfast  Patient Instructions: 1)  Schedule your appointment in 4 months for stomach and feet. 2)  Come fasting to this office for lipids.  No food after midnight before this visit 3)  This office will call medical specialty services and check on your referral to the liver specialist.   4)  Keep your appointment for a colonscopy as ordered Prescriptions: NEURONTIN 300 MG  CAPS (GABAPENTIN) 2 tablet by mouth two times a day  #120 x 11   Entered and Authorized by:   Lehman Prom FNP   Signed by:   Lehman Prom FNP on 08/16/2010   Method used:   Electronically to        [facility] (retail)       [address]       [place], [place]  [address]       Ph: [phone]       Fax: [phone]   RxID:   [id number]    Orders Added: 1)  Est. Patient Level III [44010]

## 2010-08-20 NOTE — Progress Notes (Signed)
Summary: REFILL ON NAPROXEN  Phone Note Call from Patient Call back at Home Phone [phone]   Reason for Call: Refill Medication Summary of Call: Thomas Mcneil PT. [name] CALLED TO SEE IF HE CAN GET A REFILL ON HIS NAPORXEN. HE [facility]. Initial call taken by: Leodis Rains,  August 09, 2010 2:39 PM  Follow-up for Phone Call        Out of pains, can't wait until Friday's appt.  [name],  August 12, 2010 9:10 AM  Naproxen refilled.  Pt. notified.  Dutch Quint RN  August 12, 2010 5:38 PM     Prescriptions: NAPROXEN 500 MG TABS (NAPROXEN) One tablet by mouth two times a day for pain  #50 x 0   Entered by:   Dutch Quint RN   Authorized by:   Lehman Prom FNP   Signed by:   Dutch Quint RN on 08/12/2010   Method used:   Electronically to        [facility] (retail)       [address]       [place], [place]  [address]       Ph: [phone]       Fax: [phone]   RxID:   [id number]

## 2010-08-22 LAB — HEMOCCULT GUIAC POC 1CARD (OFFICE): Fecal Occult Bld: NEGATIVE

## 2010-08-23 ENCOUNTER — Encounter (INDEPENDENT_AMBULATORY_CARE_PROVIDER_SITE_OTHER): Payer: Self-pay | Admitting: Nurse Practitioner

## 2010-08-27 NOTE — Miscellaneous (Signed)
Summary: Colonscopy Results  Clinical Lists Changes  Observations: Added new observation of COLONNXTDUE: 08/2020 (08/23/2010 17:41) Added new observation of COLONOSCOPY: no colorectal neoplasia diverticulosis - scattered, few intermal  hemorrhoids - medium (08/19/2010 17:42)      Colonoscopy  Procedure date:  08/19/2010  Findings:      no colorectal neoplasia diverticulosis - scattered, few intermal  hemorrhoids - medium  Procedures Next Due Date:    Colonoscopy: 08/2020

## 2010-09-05 NOTE — Letter (Signed)
Summary: [facility]  [facility]   Imported By: Arta Bruce 08/29/2010 16:52:42  _____________________________________________________________________  External Attachment:    Type:   Image     Comment:   External Document

## 2010-09-16 LAB — CBC
Hemoglobin: 14.5 g/dL (ref 13.0–17.0)
MCHC: 33 g/dL (ref 30.0–36.0)
MCV: 91.6 fL (ref 78.0–100.0)
Platelets: 208 10*3/uL (ref 150–400)
RDW: 13.1 % (ref 11.5–15.5)

## 2010-09-16 LAB — PROTIME-INR
INR: 1 (ref 0.00–1.49)
Prothrombin Time: 13.6 seconds (ref 11.6–15.2)

## 2010-10-22 NOTE — Procedures (Signed)
DUPLEX DEEP VENOUS EXAM - LOWER EXTREMITY   INDICATION:  Questionable venous insufficiency/ulcer.   HISTORY:  Edema:  No.  Trauma/Surgery:  Ulcer on right calf.  Pain:  No.  PE:  No.  Previous DVT:  No.  Anticoagulants:  No.  Other:  No.   DUPLEX EXAM:                CFV   SFV   PopV  PTV    GSV                R  L  R  L  R  L  R   L  R  L  Thrombosis    o  o  o  o  o  o  o   o  o  o  Spontaneous   +  +  +  +  +  +  +   +  +  +  Phasic        +  +  +  +  +  +  +   +  +  +  Augmentation  +  +  +  +  +  +  +   +  +  +  Compressible  +  +  +  +  +  +  +   +  +  +  Competent     +  +  +  +  +  +  +   +  +  +   Legend:  + - yes  o - no  p - partial  D - decreased   IMPRESSION:  There is no evidence suggesting deep venous thrombus in  bilateral legs.  There is no evidence of venous insufficiency.    _____________________________  Quita Skye Hart [place], M.D.   CB/MEDQ  D:  11/13/2009  T:  11/13/2009  Job:  [id number]

## 2010-12-06 ENCOUNTER — Emergency Department (HOSPITAL_COMMUNITY)
Admission: EM | Admit: 2010-12-06 | Discharge: 2010-12-06 | Disposition: A | Discharge status: Home / Self Care | Payer: Medicare Other | Attending: General Surgery | Admitting: General Surgery

## 2010-12-06 DIAGNOSIS — M79609 Pain in unspecified limb: Secondary | ICD-10-CM | POA: Insufficient documentation

## 2010-12-06 DIAGNOSIS — H5789 Other specified disorders of eye and adnexa: Secondary | ICD-10-CM | POA: Insufficient documentation

## 2010-12-06 DIAGNOSIS — G8929 Other chronic pain: Secondary | ICD-10-CM | POA: Insufficient documentation

## 2010-12-06 DIAGNOSIS — IMO0002 Reserved for concepts with insufficient information to code with codable children: Secondary | ICD-10-CM | POA: Insufficient documentation

## 2011-03-03 ENCOUNTER — Inpatient Hospital Stay (INDEPENDENT_AMBULATORY_CARE_PROVIDER_SITE_OTHER)
Admission: RE | Admit: 2011-03-03 | Discharge: 2011-03-03 | Disposition: A | Discharge status: Home / Self Care | Payer: Medicare Other | Source: Ambulatory Visit | Attending: Family Medicine | Admitting: Family Medicine

## 2011-03-03 DIAGNOSIS — M79609 Pain in unspecified limb: Secondary | ICD-10-CM

## 2011-03-03 DIAGNOSIS — M5430 Sciatica, unspecified side: Secondary | ICD-10-CM

## 2011-10-06 ENCOUNTER — Emergency Department (INDEPENDENT_AMBULATORY_CARE_PROVIDER_SITE_OTHER)
Admission: EM | Admit: 2011-10-06 | Discharge: 2011-10-06 | Disposition: A | Discharge status: Home / Self Care | Payer: Medicare Other | Source: Home / Self Care | Attending: Family Medicine | Admitting: Family Medicine

## 2011-10-06 ENCOUNTER — Encounter (HOSPITAL_COMMUNITY): Payer: Self-pay

## 2011-10-06 DIAGNOSIS — J040 Acute laryngitis: Secondary | ICD-10-CM

## 2011-10-06 HISTORY — DX: Polyneuropathy, unspecified: G62.9

## 2011-10-06 MED ORDER — PREDNISONE (PAK) 10 MG PO TABS: ORAL_TABLET | ORAL | Status: AC

## 2011-10-06 NOTE — Discharge Instructions (Signed)
Take medication as directed. Please follow up with Dr. Avel Sensor ENT practice as discussed. We have placed a referral. Please call the office to arrange an appointment, discussing your visit here and your symptoms. Return to care should your symptoms not improve, or worsen in any way, such as shortness of breath or inability to swallow liquids and food.

## 2011-10-06 NOTE — ED Provider Notes (Signed)
History     CSN: [id number]  Arrival date & time 10/06/11  4098   First MD Initiated Contact with Patient 10/06/11 1012      Chief Complaint  Patient presents with  . Hoarse    (Consider location/radiation/quality/duration/timing/severity/associated sxs/prior treatment) HPI Comments: Thomas Mcneil presents for evaluation of 2 months of hoarseness. He denies any other symptoms; he denies any cough, fever, nasal congestion, rhinorrhea, or ear fullness. He does report a history of seasonal allergies for which he takes an antihistamine which he cannot remember. He also reports a history of GERD for which he takes Nexium. He reports a social history of tobacco abuse, ethanol abuse, and other substance abuse. He reports that he has reduced his tobacco use from one pack a day to one pack every 3 days. He denies any trouble swallowing. Patient is a 61 y.o. male presenting with general illness.  Illness  The current episode started more than 2 weeks ago. The problem has been unchanged. The symptoms are relieved by nothing. The symptoms are aggravated by nothing. Pertinent negatives include no fever, no nausea, no vomiting, no congestion, no rhinorrhea, no sore throat, no swollen glands, no neck pain, no neck stiffness and no cough. He has been eating and drinking normally.    Past Medical History  Diagnosis Date  . Peripheral neuropathy     History reviewed. No pertinent past surgical history.  No family history on file.  History  Substance Use Topics  . Smoking status: Current Everyday Smoker  . Smokeless tobacco: Not on file  . Alcohol Use: Yes      Review of Systems  Constitutional: Negative.  Negative for fever.  HENT: Positive for voice change. Negative for congestion, sore throat, rhinorrhea, trouble swallowing and neck pain.   Eyes: Negative.   Respiratory: Negative.  Negative for cough.   Cardiovascular: Negative.   Gastrointestinal: Negative.  Negative for nausea and vomiting.    Genitourinary: Negative.   Musculoskeletal: Negative.   Skin: Negative.   Neurological: Negative.     Allergies  Review of patient's allergies indicates no known allergies.  Home Medications   Current Outpatient Rx  Name Route Sig Dispense Refill  . LEXAPRO PO Oral Take by mouth.    . NEURONTIN PO Oral Take by mouth.    Marland Kitchen NAPROXEN PO Oral Take by mouth.    . TRAZODONE HCL PO Oral Take by mouth.    Marland Kitchen PREDNISONE (PAK) 10 MG PO TABS  Take 6 tablets on day 1, 5 tablets on day 2, 4 tablets on day 3, 3 tablets on day 4, 2 tablets on day 5, 1 tablet on day 6 21 tablet 0    BP 139/85  Pulse 71  Temp(Src) 97.8 F (36.6 C) (Oral)  Resp 18  SpO2 97%  Physical Exam  Nursing note and vitals reviewed. Constitutional: He is oriented to person, place, and time. He appears well-developed and well-nourished.  HENT:  Head: Normocephalic and atraumatic.  Right Ear: Tympanic membrane normal.  Left Ear: Tympanic membrane normal.  Mouth/Throat: Uvula is midline, oropharynx is clear and moist and mucous membranes are normal.  Eyes: EOM are normal.  Neck: Normal range of motion.  Cardiovascular: Normal rate, regular rhythm, S1 normal, S2 normal and normal heart sounds.   Pulmonary/Chest: Effort normal and breath sounds normal. He has no decreased breath sounds. He has no wheezes. He has no rhonchi. He has no rales.  Musculoskeletal: Normal range of motion.  Lymphadenopathy:    He  has no cervical adenopathy.  Neurological: He is alert and oriented to person, place, and time.  Skin: Skin is warm and dry.  Psychiatric: His behavior is normal.    ED Course  Procedures (including critical care time)  Labs Reviewed - No data to display No results found.   1. Laryngitis acute       MDM  Given prednisone dosepak; referred to ENT on call, Dr. Illene Silver.        Renaee Munda, MD 10/06/11 1240

## 2011-10-06 NOTE — ED Notes (Signed)
C/o hoarseness for 2 months. Denies sore throat.  States he thought it would get better.

## 2011-12-19 ENCOUNTER — Other Ambulatory Visit (HOSPITAL_COMMUNITY): Payer: Self-pay | Admitting: Otolaryngology

## 2011-12-22 ENCOUNTER — Encounter (HOSPITAL_COMMUNITY): Payer: Self-pay | Admitting: Pharmacy Technician

## 2011-12-23 ENCOUNTER — Encounter (HOSPITAL_COMMUNITY)
Admission: RE | Admit: 2011-12-23 | Discharge: 2011-12-23 | Disposition: A | Discharge status: Home / Self Care | Payer: Medicare Other | Source: Ambulatory Visit | Attending: Otolaryngology | Admitting: Otolaryngology

## 2011-12-23 ENCOUNTER — Encounter (HOSPITAL_COMMUNITY): Payer: Self-pay

## 2011-12-23 HISTORY — DX: Major depressive disorder, single episode, unspecified: F32.9

## 2011-12-23 HISTORY — DX: Gastro-esophageal reflux disease without esophagitis: K21.9

## 2011-12-23 HISTORY — DX: Depression, unspecified: F32.A

## 2011-12-23 LAB — BASIC METABOLIC PANEL WITH GFR
BUN: 14 mg/dL (ref 6–23)
CO2: 26 meq/L (ref 19–32)
Calcium: 9.2 mg/dL (ref 8.4–10.5)
Chloride: 107 meq/L (ref 96–112)
Creatinine, Ser: 1.58 mg/dL — ABNORMAL HIGH (ref 0.50–1.35)
GFR calc Af Amer: 53 mL/min — ABNORMAL LOW
GFR calc non Af Amer: 46 mL/min — ABNORMAL LOW
Glucose, Bld: 126 mg/dL — ABNORMAL HIGH (ref 70–99)
Potassium: 4.2 meq/L (ref 3.5–5.1)
Sodium: 141 meq/L (ref 135–145)

## 2011-12-23 LAB — CBC
MCH: 30.9 pg (ref 26.0–34.0)
MCHC: 34.8 g/dL (ref 30.0–36.0)
MCV: 88.9 fL (ref 78.0–100.0)
RBC: 4.5 MIL/uL (ref 4.22–5.81)

## 2011-12-23 LAB — SURGICAL PCR SCREEN
MRSA, PCR: NEGATIVE
Staphylococcus aureus: POSITIVE — AB

## 2011-12-23 NOTE — Pre-Procedure Instructions (Signed)
20 Thomas Mcneil  12/23/2011   Your procedure is scheduled on:  12/26/11  Report to [facility] at 700 AM.  Call this number if you have problems the morning of surgery: [phone]   Remember:   Do not eat food:After Midnight.  May have clear liquids:until Midnight .  Clear liquids include soda, tea, black coffee, apple or grape juice, broth.  Take these medicines the morning of surgery with A SIP OF WATER: lexapro,flonase,neurontin,prilosec   Do not wear jewelry, make-up or nail polish.  Do not wear lotions, powders, or perfumes. You may wear deodorant.  Do not shave 48 hours prior to surgery. Men may shave face and neck.  Do not bring valuables to the hospital.  Contacts, dentures or bridgework may not be worn into surgery.  Leave suitcase in the car. After surgery it may be brought to your room.  For patients admitted to the hospital, checkout time is 11:00 AM the day of discharge.   Patients discharged the day of surgery will not be allowed to drive home.  Name and phone number of your driver: family  Special Instructions: CHG Shower Use Special Wash: 1/2 bottle night before surgery and 1/2 bottle morning of surgery.   Please read over the following fact sheets that you were given: Pain Booklet, Coughing and Deep Breathing, MRSA Information and Surgical Site Infection Prevention

## 2011-12-26 ENCOUNTER — Ambulatory Visit (HOSPITAL_COMMUNITY): Payer: Medicare Other | Admitting: Anesthesiology

## 2011-12-26 ENCOUNTER — Encounter (HOSPITAL_COMMUNITY): Payer: Self-pay | Admitting: *Deleted

## 2011-12-26 ENCOUNTER — Ambulatory Visit (HOSPITAL_COMMUNITY)
Admission: RE | Admit: 2011-12-26 | Discharge: 2011-12-26 | Disposition: A | Discharge status: Home / Self Care | Payer: Medicare Other | Source: Ambulatory Visit | Attending: Otolaryngology | Admitting: Otolaryngology

## 2011-12-26 ENCOUNTER — Encounter (HOSPITAL_COMMUNITY): Payer: Self-pay | Admitting: Anesthesiology

## 2011-12-26 ENCOUNTER — Encounter (HOSPITAL_COMMUNITY)
Admission: RE | Disposition: A | Discharge status: Home / Self Care | Payer: Self-pay | Source: Ambulatory Visit | Attending: Otolaryngology

## 2011-12-26 DIAGNOSIS — F329 Major depressive disorder, single episode, unspecified: Secondary | ICD-10-CM | POA: Insufficient documentation

## 2011-12-26 DIAGNOSIS — G609 Hereditary and idiopathic neuropathy, unspecified: Secondary | ICD-10-CM | POA: Insufficient documentation

## 2011-12-26 DIAGNOSIS — F17200 Nicotine dependence, unspecified, uncomplicated: Secondary | ICD-10-CM | POA: Insufficient documentation

## 2011-12-26 DIAGNOSIS — K219 Gastro-esophageal reflux disease without esophagitis: Secondary | ICD-10-CM | POA: Insufficient documentation

## 2011-12-26 DIAGNOSIS — F3289 Other specified depressive episodes: Secondary | ICD-10-CM | POA: Insufficient documentation

## 2011-12-26 DIAGNOSIS — Z01812 Encounter for preprocedural laboratory examination: Secondary | ICD-10-CM | POA: Insufficient documentation

## 2011-12-26 DIAGNOSIS — J383 Other diseases of vocal cords: Secondary | ICD-10-CM | POA: Insufficient documentation

## 2011-12-26 DIAGNOSIS — N289 Disorder of kidney and ureter, unspecified: Secondary | ICD-10-CM | POA: Insufficient documentation

## 2011-12-26 HISTORY — PX: DIRECT LARYNGOSCOPY: SHX5326

## 2011-12-26 SURGERY — LARYNGOSCOPY, DIRECT
Anesthesia: General | Site: Mouth | Wound class: Clean Contaminated

## 2011-12-26 MED ORDER — OXYMETAZOLINE HCL 0.05 % NA SOLN
NASAL | Status: AC
  Filled 2011-12-26: qty 15

## 2011-12-26 MED ORDER — OXYCODONE HCL 5 MG PO TABS: 5.0000 mg | ORAL_TABLET | Freq: Once | ORAL | Status: DC | PRN

## 2011-12-26 MED ORDER — LABETALOL HCL 5 MG/ML IV SOLN
INTRAVENOUS | Status: DC | PRN
  Administered 2011-12-26: 5 mg via INTRAVENOUS

## 2011-12-26 MED ORDER — OXYMETAZOLINE HCL 0.05 % NA SOLN
NASAL | Status: DC | PRN
  Administered 2011-12-26: 1

## 2011-12-26 MED ORDER — HYDROMORPHONE HCL PF 1 MG/ML IJ SOLN: 0.2500 mg | INTRAMUSCULAR | Status: DC | PRN

## 2011-12-26 MED ORDER — FENTANYL CITRATE 0.05 MG/ML IJ SOLN
INTRAMUSCULAR | Status: DC | PRN
  Administered 2011-12-26: 100 ug via INTRAVENOUS
  Administered 2011-12-26: 50 ug via INTRAVENOUS

## 2011-12-26 MED ORDER — 0.9 % SODIUM CHLORIDE (POUR BTL) OPTIME
TOPICAL | Status: DC | PRN
  Administered 2011-12-26: 1000 mL

## 2011-12-26 MED ORDER — PROMETHAZINE HCL 25 MG/ML IJ SOLN: 6.2500 mg | INTRAMUSCULAR | Status: DC | PRN

## 2011-12-26 MED ORDER — LACTATED RINGERS IV SOLN: INTRAVENOUS | Status: DC

## 2011-12-26 MED ORDER — KETOROLAC TROMETHAMINE 30 MG/ML IJ SOLN
INTRAMUSCULAR | Status: AC
  Administered 2011-12-26: 30 mg via INTRAVENOUS
  Filled 2011-12-26: qty 1

## 2011-12-26 MED ORDER — LACTATED RINGERS IV SOLN
INTRAVENOUS | Status: DC | PRN
  Administered 2011-12-26: 1000 mL
  Administered 2011-12-26: 08:00:00 via INTRAVENOUS

## 2011-12-26 MED ORDER — ARTIFICIAL TEARS OP OINT
TOPICAL_OINTMENT | OPHTHALMIC | Status: DC | PRN
  Administered 2011-12-26: 1 via OPHTHALMIC

## 2011-12-26 MED ORDER — LIDOCAINE HCL 4 % MT SOLN
OROMUCOSAL | Status: DC | PRN
  Administered 2011-12-26: 4 mL via TOPICAL

## 2011-12-26 MED ORDER — OXYCODONE HCL 5 MG/5ML PO SOLN: 5.0000 mg | Freq: Once | ORAL | Status: DC | PRN

## 2011-12-26 MED ORDER — PROPOFOL 10 MG/ML IV EMUL
INTRAVENOUS | Status: DC | PRN
  Administered 2011-12-26: 70 mg via INTRAVENOUS
  Administered 2011-12-26: 200 mg via INTRAVENOUS
  Administered 2011-12-26: 40 mg via INTRAVENOUS
  Administered 2011-12-26: 30 mg via INTRAVENOUS

## 2011-12-26 MED ORDER — DEXAMETHASONE SODIUM PHOSPHATE 10 MG/ML IJ SOLN
INTRAMUSCULAR | Status: DC | PRN
  Administered 2011-12-26: 10 mg via INTRAVENOUS

## 2011-12-26 MED ORDER — MIDAZOLAM HCL 5 MG/5ML IJ SOLN
INTRAMUSCULAR | Status: DC | PRN
  Administered 2011-12-26: 2 mg via INTRAVENOUS

## 2011-12-26 MED ORDER — SUCCINYLCHOLINE CHLORIDE 20 MG/ML IJ SOLN
INTRAMUSCULAR | Status: DC | PRN
  Administered 2011-12-26: 40 mg via INTRAVENOUS
  Administered 2011-12-26: 60 mg via INTRAVENOUS
  Administered 2011-12-26: 100 mg via INTRAVENOUS
  Administered 2011-12-26: 20 mg via INTRAVENOUS

## 2011-12-26 MED ORDER — KETOROLAC TROMETHAMINE 30 MG/ML IJ SOLN
15.0000 mg | Freq: Once | INTRAMUSCULAR | Status: AC | PRN
  Administered 2011-12-26: 30 mg via INTRAVENOUS

## 2011-12-26 MED ORDER — LIDOCAINE HCL 1 % IJ SOLN
INTRAMUSCULAR | Status: DC | PRN
  Administered 2011-12-26 (×2): 100 mg via INTRADERMAL

## 2011-12-26 MED ORDER — DEXTROSE 5 % IV SOLN
2.0000 g | Freq: Once | INTRAVENOUS | Status: AC
  Administered 2011-12-26: 2 g via INTRAVENOUS

## 2011-12-26 MED ORDER — ONDANSETRON HCL 4 MG/2ML IJ SOLN
INTRAMUSCULAR | Status: DC | PRN
  Administered 2011-12-26: 4 mg via INTRAVENOUS

## 2011-12-26 MED ORDER — DEXAMETHASONE SODIUM PHOSPHATE 10 MG/ML IJ SOLN: 10.0000 mg | Freq: Once | INTRAMUSCULAR | Status: DC

## 2011-12-26 SURGICAL SUPPLY — 25 items
BALLN PULM 15 16.5 18X75 (BALLOONS)
BALLOON PULM 15 16.5 18X75 (BALLOONS) IMPLANT
CANISTER SUCTION 2500CC (MISCELLANEOUS) ×2 IMPLANT
CONT SPEC 4OZ CLIKSEAL STRL BL (MISCELLANEOUS) ×6 IMPLANT
COVER TABLE BACK 60X90 (DRAPES) ×2 IMPLANT
DRAPE PROXIMA HALF (DRAPES) ×4 IMPLANT
GLOVE BIOGEL PI IND STRL 6.5 (GLOVE) ×1 IMPLANT
GLOVE BIOGEL PI INDICATOR 6.5 (GLOVE) ×1
GLOVE SURG SS PI 6.5 STRL IVOR (GLOVE) ×2 IMPLANT
GLOVE SURG SS PI 7.5 STRL IVOR (GLOVE) ×2 IMPLANT
GOWN STRL NON-REIN LRG LVL3 (GOWN DISPOSABLE) ×4 IMPLANT
GUARD TEETH (MISCELLANEOUS) ×2 IMPLANT
KIT BASIN OR (CUSTOM PROCEDURE TRAY) ×2 IMPLANT
MARKER SKIN DUAL TIP RULER LAB (MISCELLANEOUS) IMPLANT
NS IRRIG 1000ML POUR BTL (IV SOLUTION) ×2 IMPLANT
PAD ARMBOARD 7.5X6 YLW CONV (MISCELLANEOUS) ×4 IMPLANT
PATTIES SURGICAL .5 X1 (DISPOSABLE) ×2 IMPLANT
SOLUTION ANTI FOG 6CC (MISCELLANEOUS) ×2 IMPLANT
SPONGE GAUZE 4X4 12PLY (GAUZE/BANDAGES/DRESSINGS) IMPLANT
SURGILUBE 2OZ TUBE FLIPTOP (MISCELLANEOUS) IMPLANT
SYR BULB 3OZ (MISCELLANEOUS) ×2 IMPLANT
SYR INFLATE BILIARY GAUGE (MISCELLANEOUS) IMPLANT
TOWEL OR 17X24 6PK STRL BLUE (TOWEL DISPOSABLE) ×4 IMPLANT
TUBE CONNECTING 12X1/4 (SUCTIONS) ×2 IMPLANT
WATER STERILE IRR 1000ML POUR (IV SOLUTION) IMPLANT

## 2011-12-26 NOTE — Anesthesia Postprocedure Evaluation (Signed)
  Anesthesia Post-op Note  Patient: Thomas Mcneil  Procedure(s) Performed: Procedure(s) (LRB): DIRECT LARYNGOSCOPY (N/A)  Patient Location: PACU  Anesthesia Type: General  Level of Consciousness: awake  Airway and Oxygen Therapy: Patient Spontanous Breathing and Patient connected to face mask oxygen  Post-op Pain: mild  Post-op Assessment: Post-op Vital signs reviewed  Post-op Vital Signs: stable  Complications: No apparent anesthesia complications

## 2011-12-26 NOTE — H&P (Signed)
12/26/11  Thomas Mcneil  PREOPERATIVE HISTORY AND PHYSICAL  CHIEF COMPLAINT: true vocal fold lesion  HISTORY: This is a 61 year old smoker who presents with hoarseness and laryngoscopy showing irregular true vocal fold lesions.  He now presents for Direct laryngoscopy with biopsy.  Dr. Emeline Darling, [name] has discussed the risks(including dental injury, bleeding, infection, airway injury), benefits, and alternatives of this procedure. The patient understands the risks and would like to proceed with the procedure. The chances of success of the procedure are >50% and the patient understands this. I personally performed an examination of the patient within 24 hours of the procedure.  PAST MEDICAL HISTORY: Past Medical History  Diagnosis Date  . Peripheral neuropathy   . Cough   . Depression   . GERD (gastroesophageal reflux disease)     PAST SURGICAL HISTORY: Past Surgical History  Procedure Date  . Tonsillectomy     MEDICATIONS: No current facility-administered medications on file prior to encounter.   Current Outpatient Prescriptions on File Prior to Encounter  Medication Sig Dispense Refill  . fluticasone (FLONASE) 50 MCG/ACT nasal spray Place 2 sprays into the nose daily.      Marland Kitchen omeprazole (PRILOSEC) 40 MG capsule Take 40 mg by mouth daily.        ALLERGIES: No Known Allergies    SOCIAL HISTORY: History   Social History  . Marital Status: Widowed    Spouse Name: N/A    Number of Children: N/A  . Years of Education: N/A   Occupational History  . Not on file.   Social History Main Topics  . Smoking status: Current Everyday Smoker -- 1.0 packs/day for 45 years    Types: Cigarettes  . Smokeless tobacco: Not on file  . Alcohol Use: No     quit x 2 mos  . Drug Use: Yes     "Crack"  . Sexually Active:    Other Topics Concern  . Not on file   Social History Narrative  . No narrative on file    FAMILY HISTORY:No family history on file.  REVIEW OF SYSTEMS:    HEENT:+ hoarseness, otherwise negative x 10 systems except per HPI   PHYSICAL EXAM:  GENERAL:  Hoarse voice, NAD VITAL SIGNS:  There were no vitals filed for this visit.  SKIN:  Warm, dry HEENT:  Oral cavity clear, uvula midline NECK:  Supple, trachea midline LYMPH:  No LAD LUNGS:  Grossly clear CARDIOVASCULAR:  RRR ABDOMEN:  Soft , NT MUSCULOSKELETAL: normal strength PSYCH:  Awake, alert NEUROLOGIC:  CN 2-12 intact and symmetric   ASSESSMENT AND PLAN: Plan to proceed with direct laryngoscopy with biopsy. Patient understands the risks, benefits, and alternatives.  12/26/11 2:13 AM Thomas Mcneil

## 2011-12-26 NOTE — Anesthesia Procedure Notes (Signed)
Procedure Name: Intubation Date/Time: 12/26/2011 9:04 AM Performed by: Leona Singleton A Pre-anesthesia Checklist: Patient identified Patient Re-evaluated:Patient Re-evaluated prior to inductionOxygen Delivery Method: Circle system utilized Preoxygenation: Pre-oxygenation with 100% oxygen Intubation Type: IV induction Ventilation: Mask ventilation without difficulty and Oral airway inserted - appropriate to patient size Laryngoscope Size: Miller and 3 Grade View: Grade III Tube type: Oral Tube size: 7.5 mm Number of attempts: 2 Airway Equipment and Method: Stylet and LTA kit utilized Placement Confirmation: positive ETCO2 and breath sounds checked- equal and bilateral Secured at: 23 cm Tube secured with: Tape Dental Injury: Teeth and Oropharynx as per pre-operative assessment  Comments: DL x2; grade 3 view. Large amount of supraglottic tissue noted-difficultly with visualizing glottic opening. Dr Jacklynn Bue pass ETT blindly with stylet through glottic opening. AOI. +ETCO2, =BBS. VSS.

## 2011-12-26 NOTE — Op Note (Signed)
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  ESTIMATED BLOOD LOSS: Approximately 10 mL.  DRAINS: none SPECIMENS: anterior commissure biopsy and left false vocal fold biopsy for frozen INDICATIONS: The patient is a 60yo smoker with a history of hoarseness for 3 months and laryngeal edema and irregularity on flexible laryngoscopy. DESCRIPTION OF OPERATION: The patient was brought to the operating room and was placed in the supine position. Direct laryngoscopy was attempted by anesthesia which was unsuccessful on first attempt. He was then intubated by the anesthesiologist by blind transoral intubation. The tube was secured to the left lower lip. During the initial laryngoscopy with the Dedo laryngoscope the true vocal folds could not be visualized, so I switched to the anterior commissure scope. The tube backed out through the glottis and had to be advanced back through the glottis with the balloon below the true vocal folds.  Direct laryngoscopy was then performed using the anterior commissure laryngoscope. The false vocal folds and supraglottis were edematous and showed some redundant tissue. I advance the scope and with cricoid pressure I was abel to visualize the true vocal folds. No focal suspicious mass was seen. I suspended the patient with the suspension system and visualized the glottis using the 0 degree Hopkins Rod. Next I took biopsies of the left false vocal fold and the anterior commissure using the straight biopsy forceps and 0 degree Hopkins Rod. The frozen sections showed benign edematous squamous tissue for the false vocal fold and benign edematous squamous tissue with moderate dysplasia for the anterior  commissure of the true vocal folds. Once the frozen sections came back benign I removed the Hopkins Rod, laryngoscope, and tooth guard after placing Afrin pledgets for 5 minutes and confirming hemostasis of the biopsy sites.  The patient was turned back to anesthesia and awakened from anesthesia and extubated. He had some coughing and laryngospasm that anesthesia treated with positive pressure ventilation, succinylcholine, and IV lidocaine. The laryngospasm then resolved. The patient tolerated the procedure well with no immediate complications and was taken to the postoperative recovery area in stable condition.   Dr. Melvenia Beam was present and performed the entire procedure. 12/26/11 11:07 AM Melvenia Beam

## 2011-12-26 NOTE — Transfer of Care (Signed)
Immediate Anesthesia Transfer of Care Note  Patient: Thomas Mcneil  Procedure(s) Performed: Procedure(s) (LRB): DIRECT LARYNGOSCOPY (N/A)  Patient Location: PACU  Anesthesia Type: General  Level of Consciousness: awake, alert , oriented and patient cooperative  Airway & Oxygen Therapy: Patient Spontanous Breathing and Patient connected to face mask oxygen  Post-op Assessment: Report given to PACU RN and Post -op Vital signs reviewed and stable  Post vital signs: Reviewed and stable  Complications: No apparent anesthesia complications

## 2011-12-26 NOTE — Anesthesia Preprocedure Evaluation (Addendum)
Anesthesia Evaluation  Patient identified by MRN, date of birth, ID band Patient awake    Reviewed: Allergy & Precautions, H&P , NPO status , Patient's Chart, lab work & pertinent test results  History of Anesthesia Complications Negative for: history of anesthetic complications  Airway Mallampati: I  Neck ROM: Full    Dental  (+) Teeth Intact, Caps and Dental Advisory Given   Pulmonary Current Smoker,  breath sounds clear to auscultation        Cardiovascular negative cardio ROS  Rhythm:Regular Rate:Normal     Neuro/Psych PSYCHIATRIC DISORDERS Depression  Neuromuscular disease (peripheral neuropathy)    GI/Hepatic GERD-  Medicated and Controlled,(+)     substance abuse  alcohol use, Hepatitis -, C  Endo/Other  negative endocrine ROS  Renal/GU Renal InsufficiencyRenal disease (Cr 1.58)     Musculoskeletal negative musculoskeletal ROS (+)   Abdominal (+) + obese,   Peds  Hematology negative hematology ROS (+)   Anesthesia Other Findings   Reproductive/Obstetrics                         Anesthesia Physical Anesthesia Plan  ASA: II  Anesthesia Plan: General   Post-op Pain Management:    Induction:   Airway Management Planned: Oral ETT  Additional Equipment:   Intra-op Plan:   Post-operative Plan: Extubation in OR  Informed Consent: I have reviewed the patients History and Physical, chart, labs and discussed the procedure including the risks, benefits and alternatives for the proposed anesthesia with the patient or authorized representative who has indicated his/her understanding and acceptance.   Dental advisory given  Plan Discussed with: CRNA and Surgeon  Anesthesia Plan Comments:         Anesthesia Quick Evaluation

## 2011-12-26 NOTE — Preoperative (Signed)
Beta Blockers   Reason not to administer Beta Blockers:Not Applicable 

## 2011-12-29 ENCOUNTER — Encounter (HOSPITAL_COMMUNITY): Payer: Self-pay | Admitting: Otolaryngology

## 2012-10-04 ENCOUNTER — Other Ambulatory Visit (HOSPITAL_COMMUNITY): Payer: Self-pay | Admitting: Otolaryngology

## 2012-10-12 ENCOUNTER — Encounter (HOSPITAL_COMMUNITY): Payer: Self-pay | Admitting: Pharmacy Technician

## 2012-10-13 ENCOUNTER — Encounter (HOSPITAL_COMMUNITY): Payer: Self-pay

## 2012-10-13 ENCOUNTER — Encounter (HOSPITAL_COMMUNITY)
Admission: RE | Admit: 2012-10-13 | Discharge: 2012-10-13 | Disposition: A | Discharge status: Home / Self Care | Payer: Medicare Other | Source: Ambulatory Visit | Attending: Otolaryngology | Admitting: Otolaryngology

## 2012-10-13 HISTORY — DX: Other complications of anesthesia, initial encounter: T88.59XA

## 2012-10-13 HISTORY — DX: Adverse effect of unspecified anesthetic, initial encounter: T41.45XA

## 2012-10-13 HISTORY — DX: Inflammatory liver disease, unspecified: K75.9

## 2012-10-13 HISTORY — DX: Unspecified osteoarthritis, unspecified site: M19.90

## 2012-10-13 LAB — COMPREHENSIVE METABOLIC PANEL
AST: 26 U/L (ref 0–37)
Albumin: 3.7 g/dL (ref 3.5–5.2)
Alkaline Phosphatase: 93 U/L (ref 39–117)
Chloride: 108 mEq/L (ref 96–112)
Potassium: 4.1 mEq/L (ref 3.5–5.1)
Sodium: 142 mEq/L (ref 135–145)
Total Bilirubin: 0.8 mg/dL (ref 0.3–1.2)
Total Protein: 6.9 g/dL (ref 6.0–8.3)

## 2012-10-13 LAB — CBC
MCHC: 35.9 g/dL (ref 30.0–36.0)
Platelets: 175 10*3/uL (ref 150–400)
RDW: 13.5 % (ref 11.5–15.5)
WBC: 6 10*3/uL (ref 4.0–10.5)

## 2012-10-13 LAB — SURGICAL PCR SCREEN
MRSA, PCR: NEGATIVE
Staphylococcus aureus: POSITIVE — AB

## 2012-10-13 NOTE — Pre-Procedure Instructions (Signed)
Thomas Mcneil  10/13/2012   Your procedure is scheduled on:  10-21-2012  Thursday   Report to [facility] at 5:30 AM. ]  Call this number if you have problems the morning of surgery: [phone]   Remember:   Do not eat food or drink liquids after midnight.    Take these medicines the morning of surgery with A SIP OF WATER: lexapro,flonase nasal spray,omeprazole(Prilosec)   Do not wear jewelry  Do not wear lotions, powders, or perfumes.   Do not shave 48 hours prior to surgery. Men may shave face and neck.  Do not bring valuables to the hospital.  Contacts, dentures or bridgework may not be worn into surgery.      Patients discharged the day of surgery will not be allowed to drive home.   Name and phone number of your driver: ______________________   Special Instructions: Shower using CHG 2 nights before surgery and the night before surgery.  If you shower the day of surgery use CHG.  Use special wash - you have one bottle of CHG for all showers.  You should use approximately 1/3 of the bottle for each shower.   Please read over the following fact sheets that you were given: Pain Booklet and Surgical Site Infection Prevention

## 2012-10-21 ENCOUNTER — Ambulatory Visit (HOSPITAL_COMMUNITY): Payer: Medicare Other | Admitting: Anesthesiology

## 2012-10-21 ENCOUNTER — Ambulatory Visit (HOSPITAL_COMMUNITY)
Admission: RE | Admit: 2012-10-21 | Discharge: 2012-10-21 | Disposition: A | Discharge status: Home / Self Care | Payer: Medicare Other | Source: Ambulatory Visit | Attending: Otolaryngology | Admitting: Otolaryngology

## 2012-10-21 ENCOUNTER — Encounter (HOSPITAL_COMMUNITY): Payer: Self-pay | Admitting: *Deleted

## 2012-10-21 ENCOUNTER — Encounter (HOSPITAL_COMMUNITY)
Admission: RE | Disposition: A | Discharge status: Home / Self Care | Payer: Self-pay | Source: Ambulatory Visit | Attending: Otolaryngology

## 2012-10-21 ENCOUNTER — Encounter (HOSPITAL_COMMUNITY): Payer: Self-pay | Admitting: Anesthesiology

## 2012-10-21 DIAGNOSIS — K921 Melena: Secondary | ICD-10-CM | POA: Diagnosis not present

## 2012-10-21 DIAGNOSIS — F1021 Alcohol dependence, in remission: Secondary | ICD-10-CM | POA: Diagnosis present

## 2012-10-21 DIAGNOSIS — R490 Dysphonia: Secondary | ICD-10-CM | POA: Diagnosis present

## 2012-10-21 DIAGNOSIS — M129 Arthropathy, unspecified: Secondary | ICD-10-CM | POA: Diagnosis present

## 2012-10-21 DIAGNOSIS — F17200 Nicotine dependence, unspecified, uncomplicated: Secondary | ICD-10-CM | POA: Diagnosis present

## 2012-10-21 DIAGNOSIS — J381 Polyp of vocal cord and larynx: Principal | ICD-10-CM | POA: Diagnosis present

## 2012-10-21 DIAGNOSIS — F3289 Other specified depressive episodes: Secondary | ICD-10-CM | POA: Diagnosis present

## 2012-10-21 DIAGNOSIS — G609 Hereditary and idiopathic neuropathy, unspecified: Secondary | ICD-10-CM | POA: Diagnosis present

## 2012-10-21 DIAGNOSIS — R1013 Epigastric pain: Secondary | ICD-10-CM | POA: Diagnosis not present

## 2012-10-21 DIAGNOSIS — F329 Major depressive disorder, single episode, unspecified: Secondary | ICD-10-CM | POA: Diagnosis present

## 2012-10-21 DIAGNOSIS — B1920 Unspecified viral hepatitis C without hepatic coma: Secondary | ICD-10-CM | POA: Diagnosis present

## 2012-10-21 DIAGNOSIS — Z79899 Other long term (current) drug therapy: Secondary | ICD-10-CM

## 2012-10-21 DIAGNOSIS — N179 Acute kidney failure, unspecified: Secondary | ICD-10-CM | POA: Diagnosis not present

## 2012-10-21 DIAGNOSIS — Z01812 Encounter for preprocedural laboratory examination: Secondary | ICD-10-CM

## 2012-10-21 DIAGNOSIS — K219 Gastro-esophageal reflux disease without esophagitis: Secondary | ICD-10-CM | POA: Diagnosis present

## 2012-10-21 HISTORY — PX: DIRECT LARYNGOSCOPY: SHX5326

## 2012-10-21 SURGERY — LARYNGOSCOPY, DIRECT
Anesthesia: General | Site: Mouth | Wound class: Clean Contaminated

## 2012-10-21 MED ORDER — SUCCINYLCHOLINE CHLORIDE 20 MG/ML IJ SOLN: INTRAMUSCULAR | Status: DC | PRN

## 2012-10-21 MED ORDER — ONDANSETRON HCL 4 MG/2ML IJ SOLN: 4.0000 mg | Freq: Once | INTRAMUSCULAR | Status: DC | PRN

## 2012-10-21 MED ORDER — 0.9 % SODIUM CHLORIDE (POUR BTL) OPTIME
TOPICAL | Status: DC | PRN
  Administered 2012-10-21: 1000 mL

## 2012-10-21 MED ORDER — FENTANYL CITRATE 0.05 MG/ML IJ SOLN
INTRAMUSCULAR | Status: DC | PRN
  Administered 2012-10-21: 25 ug via INTRAVENOUS
  Administered 2012-10-21: 125 ug via INTRAVENOUS

## 2012-10-21 MED ORDER — MIDAZOLAM HCL 5 MG/5ML IJ SOLN
INTRAMUSCULAR | Status: DC | PRN
  Administered 2012-10-21: 2 mg via INTRAVENOUS

## 2012-10-21 MED ORDER — OXYCODONE HCL 5 MG/5ML PO SOLN: 5.0000 mg | Freq: Once | ORAL | Status: AC | PRN

## 2012-10-21 MED ORDER — HYDROMORPHONE HCL PF 1 MG/ML IJ SOLN
INTRAMUSCULAR | Status: AC
  Filled 2012-10-21: qty 1

## 2012-10-21 MED ORDER — OXYCODONE HCL 5 MG PO TABS
ORAL_TABLET | ORAL | Status: AC
  Filled 2012-10-21: qty 1

## 2012-10-21 MED ORDER — MEPERIDINE HCL 25 MG/ML IJ SOLN: 6.2500 mg | INTRAMUSCULAR | Status: DC | PRN

## 2012-10-21 MED ORDER — ONDANSETRON HCL 4 MG/2ML IJ SOLN
INTRAMUSCULAR | Status: DC | PRN
  Administered 2012-10-21: 4 mg via INTRAVENOUS

## 2012-10-21 MED ORDER — DEXTROSE 5 % IV SOLN
INTRAVENOUS | Status: DC | PRN
  Administered 2012-10-21: 08:00:00 via INTRAVENOUS

## 2012-10-21 MED ORDER — PROPOFOL 10 MG/ML IV BOLUS
INTRAVENOUS | Status: DC | PRN
  Administered 2012-10-21: 20 mg via INTRAVENOUS
  Administered 2012-10-21: 120 mg via INTRAVENOUS
  Administered 2012-10-21: 10 mg via INTRAVENOUS

## 2012-10-21 MED ORDER — CEFAZOLIN SODIUM-DEXTROSE 2-3 GM-% IV SOLR
2.0000 g | Freq: Once | INTRAVENOUS | Status: AC
  Administered 2012-10-21: 2 g via INTRAVENOUS
  Filled 2012-10-21: qty 50

## 2012-10-21 MED ORDER — DEXAMETHASONE SODIUM PHOSPHATE 10 MG/ML IJ SOLN
10.0000 mg | INTRAMUSCULAR | Status: AC
  Administered 2012-10-21: 10 mg via INTRAVENOUS
  Filled 2012-10-21: qty 1

## 2012-10-21 MED ORDER — OXYMETAZOLINE HCL 0.05 % NA SOLN
NASAL | Status: DC | PRN
  Administered 2012-10-21: 1 via NASAL

## 2012-10-21 MED ORDER — HYDROMORPHONE HCL PF 1 MG/ML IJ SOLN
0.2500 mg | INTRAMUSCULAR | Status: DC | PRN
  Administered 2012-10-21: 0.25 mg via INTRAVENOUS
  Administered 2012-10-21: 0.5 mg via INTRAVENOUS
  Administered 2012-10-21: 0.25 mg via INTRAVENOUS

## 2012-10-21 MED ORDER — MIDAZOLAM HCL 5 MG/5ML IJ SOLN: INTRAMUSCULAR | Status: DC | PRN

## 2012-10-21 MED ORDER — SUCCINYLCHOLINE CHLORIDE 20 MG/ML IJ SOLN
INTRAMUSCULAR | Status: DC | PRN
  Administered 2012-10-21: 120 mg via INTRAVENOUS

## 2012-10-21 MED ORDER — LIDOCAINE HCL (CARDIAC) 20 MG/ML IV SOLN
INTRAVENOUS | Status: DC | PRN
  Administered 2012-10-21: 100 mg via INTRAVENOUS

## 2012-10-21 MED ORDER — LACTATED RINGERS IV SOLN
INTRAVENOUS | Status: DC | PRN
  Administered 2012-10-21 (×2): via INTRAVENOUS

## 2012-10-21 MED ORDER — OXYCODONE HCL 5 MG PO TABS
5.0000 mg | ORAL_TABLET | Freq: Once | ORAL | Status: AC | PRN
  Administered 2012-10-21: 5 mg via ORAL

## 2012-10-21 MED ORDER — LIDOCAINE HCL (CARDIAC) 20 MG/ML IV SOLN: INTRAVENOUS | Status: DC | PRN

## 2012-10-21 MED ORDER — CEFAZOLIN SODIUM-DEXTROSE 2-3 GM-% IV SOLR
INTRAVENOUS | Status: AC
  Filled 2012-10-21: qty 50

## 2012-10-21 MED ORDER — OXYMETAZOLINE HCL 0.05 % NA SOLN
NASAL | Status: AC
  Filled 2012-10-21: qty 15

## 2012-10-21 SURGICAL SUPPLY — 20 items
CANISTER SUCTION 2500CC (MISCELLANEOUS) ×2 IMPLANT
CONT SPEC 4OZ CLIKSEAL STRL BL (MISCELLANEOUS) ×2 IMPLANT
COVER MAYO STAND STRL (DRAPES) ×2 IMPLANT
COVER TABLE BACK 60X90 (DRAPES) ×2 IMPLANT
DRAPE PROXIMA HALF (DRAPES) ×2 IMPLANT
GLOVE BIOGEL PI IND STRL 8 (GLOVE) ×1 IMPLANT
GLOVE BIOGEL PI INDICATOR 8 (GLOVE) ×1
GLOVE SURG SS PI 6.5 STRL IVOR (GLOVE) ×2 IMPLANT
GLOVE SURG SS PI 7.5 STRL IVOR (GLOVE) ×2 IMPLANT
GOWN STRL NON-REIN LRG LVL3 (GOWN DISPOSABLE) ×4 IMPLANT
GUARD TEETH (MISCELLANEOUS) ×2 IMPLANT
MARKER SKIN DUAL TIP RULER LAB (MISCELLANEOUS) ×2 IMPLANT
NS IRRIG 1000ML POUR BTL (IV SOLUTION) ×2 IMPLANT
PAD ARMBOARD 7.5X6 YLW CONV (MISCELLANEOUS) ×4 IMPLANT
PATTIES SURGICAL .5 X1 (DISPOSABLE) ×2 IMPLANT
SOLUTION ANTI FOG 6CC (MISCELLANEOUS) ×2 IMPLANT
SPONGE GAUZE 4X4 12PLY (GAUZE/BANDAGES/DRESSINGS) ×2 IMPLANT
TOWEL OR 17X24 6PK STRL BLUE (TOWEL DISPOSABLE) ×4 IMPLANT
TUBE CONNECTING 12X1/4 (SUCTIONS) ×2 IMPLANT
WATER STERILE IRR 1000ML POUR (IV SOLUTION) ×2 IMPLANT

## 2012-10-21 NOTE — H&P (Signed)
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  He now presents for direct laryngoscopy and biopsy of a laryngeal mass.  Dr. Emeline Darling, [name] has discussed the risks (tooth injury, bleeding, airway injury, risks of general anesthesia, etc.), benefits, and alternatives of this procedure. The patient understands the risks and would like to proceed with the procedure. The chances of success of the procedure are >50% and the patient understands this. I personally performed an examination of the patient within 24 hours of the procedure.  PAST MEDICAL HISTORY: Past Medical History  Diagnosis Date  . Depression   . GERD (gastroesophageal reflux disease)   . Complication of anesthesia     slow to wake up after last surgery  . Peripheral neuropathy     feet and hands  . Arthritis   . Hepatitis     hep c    PAST SURGICAL HISTORY: Past Surgical History  Procedure Laterality Date  . Tonsillectomy    . Direct laryngoscopy  12/26/2011    Procedure: DIRECT LARYNGOSCOPY;  Surgeon: Thomas Beam, MD;  Location: [facility] OR;  Service: ENT;  Laterality: N/A;  Directy Laryngoscopy with biopsy [phone]    MEDICATIONS: No current facility-administered medications on file prior to encounter.   Current Outpatient Prescriptions on File Prior to Encounter  Medication Sig Dispense Refill  . escitalopram (LEXAPRO) 20 MG tablet Take 20 mg by mouth daily.      . fluticasone (FLONASE) 50 MCG/ACT nasal spray Place 2 sprays into the nose daily.      Marland Kitchen gabapentin (NEURONTIN) 600 MG tablet Take 600 mg by mouth 3 (three) times daily.      . hydrOXYzine (ATARAX/VISTARIL) 25 MG tablet Take 25 mg by mouth daily as needed  for itching (for allergies). For allergies      . Multiple Vitamin (MULTIVITAMIN WITH MINERALS) TABS Take 1 tablet by mouth daily.      . naproxen (NAPROSYN) 500 MG tablet Take 500 mg by mouth 2 (two) times daily with a meal.      . omeprazole (PRILOSEC) 40 MG capsule Take 40 mg by mouth daily.      . traZODone (DESYREL) 100 MG tablet Take 200 mg by mouth at bedtime.        ALLERGIES: No Known Allergies   SOCIAL HISTORY: History   Social History  . Marital Status: Widowed    Spouse Name: N/A    Number of Children: N/A  . Years of Education: N/A   Occupational History  . Not on file.   Social History Main Topics  . Smoking status: Current Every Day Smoker -- 0.25 packs/day for 45 years    Types: Cigarettes  . Smokeless tobacco: Not on file  . Alcohol Use: No     Comment: quit   . Drug Use: Yes     Comment: "Crack" none in 1 year  . Sexually Active: Not on file   Other Topics Concern  . Not on file   Social History Narrative  . No narrative on file    FAMILY HISTORY:History reviewed. No pertinent family history.  REVIEW OF SYSTEMS:  HEENT: hoarseness/dysphonia, otherwise negative x 10 systems except per HPI   PHYSICAL EXAM:  GENERAL:  NAD, hoarse voice  VITAL SIGNS:   Filed Vitals:   10/21/12 0630  BP: 151/90  Pulse: 103  Temp: 97.3 F (36.3 C)  Resp: 20    SKIN:  Warm, dry HEENT:  Oral cavity clear NECK:  Supple, trachea midline LYMPH:  No LAD LUNGS:  Grossly clear CARDIOVASCULAR:  RRR ABDOMEN:  Soft, NT MUSCULOSKELETAL: normal strength PSYCH:  Normal affect NEUROLOGIC:  CN 2-12 intact and symmetric   ASSESSMENT AND PLAN: Plan to proceed with direct laryngoscopy with biopsy. Patient understands the risks, benefits, and alternatives. Informed written consent on chart. 10/21/2012  7:08 AM Thomas Mcneil

## 2012-10-21 NOTE — Anesthesia Preprocedure Evaluation (Addendum)
Anesthesia Evaluation  Patient identified by MRN, date of birth, ID band Patient awake    Reviewed: Allergy & Precautions, H&P , NPO status , Patient's Chart, lab work & pertinent test results, reviewed documented beta blocker date and time   History of Anesthesia Complications (+) PROLONGED EMERGENCE  Airway Mallampati: I TM Distance: >3 FB Neck ROM: Full    Dental  (+) Teeth Intact, Caps and Dental Advisory Given   Pulmonary Current Smoker,          Cardiovascular     Neuro/Psych    GI/Hepatic GERD-  Medicated and Controlled,(+) Hepatitis -, C  Endo/Other    Renal/GU      Musculoskeletal   Abdominal   Peds  Hematology   Anesthesia Other Findings   Reproductive/Obstetrics                          Anesthesia Physical Anesthesia Plan  ASA: III  Anesthesia Plan: General   Post-op Pain Management:    Induction: Intravenous  Airway Management Planned: Oral ETT  Additional Equipment:   Intra-op Plan:   Post-operative Plan: Extubation in OR  Informed Consent: I have reviewed the patients History and Physical, chart, labs and discussed the procedure including the risks, benefits and alternatives for the proposed anesthesia with the patient or authorized representative who has indicated his/her understanding and acceptance.     Plan Discussed with: CRNA and Surgeon  Anesthesia Plan Comments:         Anesthesia Quick Evaluation

## 2012-10-21 NOTE — Anesthesia Postprocedure Evaluation (Signed)
Anesthesia Post Note  Patient: Thomas Mcneil  Procedure(s) Performed: Procedure(s) (LRB): DIRECT LARYNGOSCOPY (N/A)  Anesthesia type: general  Patient location: PACU  Post pain: Pain level controlled  Post assessment: Patient's Cardiovascular Status Stable  Last Vitals:  Filed Vitals:   10/21/12 1028  BP: 164/94  Pulse: 62  Temp: 36.1 C  Resp: 18    Post vital signs: Reviewed and stable  Level of consciousness: sedated  Complications: No apparent anesthesia complications

## 2012-10-21 NOTE — Transfer of Care (Signed)
Immediate Anesthesia Transfer of Care Note  Patient: Thomas Mcneil  Procedure(s) Performed: Procedure(s): DIRECT LARYNGOSCOPY (N/A)  Patient Location: PACU  Anesthesia Type:General  Level of Consciousness: awake and patient cooperative  Airway & Oxygen Therapy: Patient Spontanous Breathing and Patient connected to face mask oxygen  Post-op Assessment: Report given to PACU RN, Post -op Vital signs reviewed and stable and Patient moving all extremities  Post vital signs: Reviewed and stable  Complications: No apparent anesthesia complications

## 2012-10-21 NOTE — Op Note (Signed)
DATE OF OPERATION: 10/21/2012 Surgeon: Melvenia Beam Procedure Performed: direct laryngoscopy with biopsy with telescope [id number]  PREOPERATIVE DIAGNOSIS: recurrent laryngeal mass POSTOPERATIVE DIAGNOSIS: benign right laryngeal mass  SURGEON: Melvenia Beam ANESTHESIA: General endotracheal.  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 DESCRIPTION OF OPERATION: The patient was brought to the operating room and was placed in the supine position and was placed under general endotracheal anesthesia by anesthesiology. The teeth were protected with a tooth guard.  Direct laryngoscopy was performed using the anterior commisure laryngoscope and the patient was suspended in the standard fashion from the [employer] stand. The tongue base and posterior pharynx were normal. The true vocal folds showed a pedunculated, smooth-walled ~0.5cm right anterior true vocal fold mass. Excisional biopsy of this was performed using the 3mm 0 degree telescope and camera and the upbiting biopsy forceps. Frozen section pathology demonstrated a benign laryngeal mass. Hemostasis was achieved with topical afrin pledgets which were then removed, and the subglottis and larynx were suctioned out. No additional masses or lesions were seen.  The anterior commissure scope and suspension and tooth guard were removed and the patient was turned back to anesthesiology.   The patient was turned back to anesthesia and awakened from anesthesia and extubated without difficulty. The patient tolerated the procedure well with no immediate complications and was taken to the postoperative recovery area in good  condition.   Dr. Melvenia Beam was present and performed the entire procedure. 10/21/2012  8:34 AM Melvenia Beam

## 2012-10-21 NOTE — Preoperative (Signed)
Beta Blockers   Reason not to administer Beta Blockers:Not Applicable 

## 2012-10-22 ENCOUNTER — Encounter (HOSPITAL_COMMUNITY): Payer: Self-pay | Admitting: Otolaryngology

## 2012-10-23 ENCOUNTER — Encounter (HOSPITAL_COMMUNITY): Payer: Self-pay | Admitting: Adult Health

## 2012-10-23 ENCOUNTER — Inpatient Hospital Stay (HOSPITAL_COMMUNITY)
Admission: RE | Admit: 2012-10-23 | Discharge: 2012-10-26 | DRG: 133 | Disposition: A | Discharge status: Home / Self Care | Payer: Medicare Other | Source: Ambulatory Visit | Attending: Otolaryngology | Admitting: Otolaryngology

## 2012-10-23 DIAGNOSIS — B1710 Acute hepatitis C without hepatic coma: Secondary | ICD-10-CM

## 2012-10-23 DIAGNOSIS — K922 Gastrointestinal hemorrhage, unspecified: Secondary | ICD-10-CM

## 2012-10-23 DIAGNOSIS — G609 Hereditary and idiopathic neuropathy, unspecified: Secondary | ICD-10-CM | POA: Diagnosis present

## 2012-10-23 DIAGNOSIS — K219 Gastro-esophageal reflux disease without esophagitis: Secondary | ICD-10-CM

## 2012-10-23 DIAGNOSIS — M129 Arthropathy, unspecified: Secondary | ICD-10-CM | POA: Diagnosis present

## 2012-10-23 DIAGNOSIS — F17200 Nicotine dependence, unspecified, uncomplicated: Secondary | ICD-10-CM | POA: Diagnosis present

## 2012-10-23 DIAGNOSIS — F329 Major depressive disorder, single episode, unspecified: Secondary | ICD-10-CM | POA: Diagnosis present

## 2012-10-23 DIAGNOSIS — K449 Diaphragmatic hernia without obstruction or gangrene: Secondary | ICD-10-CM | POA: Diagnosis present

## 2012-10-23 DIAGNOSIS — N179 Acute kidney failure, unspecified: Secondary | ICD-10-CM | POA: Diagnosis not present

## 2012-10-23 DIAGNOSIS — B1920 Unspecified viral hepatitis C without hepatic coma: Secondary | ICD-10-CM | POA: Diagnosis present

## 2012-10-23 DIAGNOSIS — F3289 Other specified depressive episodes: Secondary | ICD-10-CM | POA: Diagnosis present

## 2012-10-23 DIAGNOSIS — K921 Melena: Secondary | ICD-10-CM | POA: Diagnosis present

## 2012-10-23 DIAGNOSIS — F1020 Alcohol dependence, uncomplicated: Secondary | ICD-10-CM | POA: Diagnosis present

## 2012-10-23 DIAGNOSIS — K625 Hemorrhage of anus and rectum: Secondary | ICD-10-CM | POA: Diagnosis present

## 2012-10-23 DIAGNOSIS — K2980 Duodenitis without bleeding: Secondary | ICD-10-CM | POA: Diagnosis present

## 2012-10-23 DIAGNOSIS — B182 Chronic viral hepatitis C: Secondary | ICD-10-CM | POA: Diagnosis present

## 2012-10-23 DIAGNOSIS — K2970 Gastritis, unspecified, without bleeding: Secondary | ICD-10-CM | POA: Diagnosis present

## 2012-10-23 LAB — CBC
HCT: 42.5 % (ref 39.0–52.0)
MCH: 30.2 pg (ref 26.0–34.0)
MCHC: 34.8 g/dL (ref 30.0–36.0)
MCV: 86.7 fL (ref 78.0–100.0)
RDW: 13.1 % (ref 11.5–15.5)

## 2012-10-23 LAB — COMPREHENSIVE METABOLIC PANEL
Albumin: 4 g/dL (ref 3.5–5.2)
BUN: 27 mg/dL — ABNORMAL HIGH (ref 6–23)
Calcium: 10 mg/dL (ref 8.4–10.5)
Creatinine, Ser: 1.5 mg/dL — ABNORMAL HIGH (ref 0.50–1.35)
Total Protein: 7.5 g/dL (ref 6.0–8.3)

## 2012-10-23 LAB — PROTIME-INR
INR: 1 (ref 0.00–1.49)
Prothrombin Time: 13.1 seconds (ref 11.6–15.2)

## 2012-10-23 LAB — TYPE AND SCREEN
ABO/RH(D): A NEG
Antibody Screen: NEGATIVE

## 2012-10-23 MED ORDER — MORPHINE SULFATE 4 MG/ML IJ SOLN
4.0000 mg | Freq: Once | INTRAMUSCULAR | Status: AC
  Administered 2012-10-23: 4 mg via INTRAVENOUS
  Filled 2012-10-23: qty 1

## 2012-10-23 MED ORDER — SODIUM CHLORIDE 0.9 % IV SOLN: INTRAVENOUS | Status: DC

## 2012-10-23 MED ORDER — PANTOPRAZOLE SODIUM 40 MG IV SOLR
40.0000 mg | Freq: Once | INTRAVENOUS | Status: AC
  Administered 2012-10-23: 40 mg via INTRAVENOUS
  Filled 2012-10-23: qty 40

## 2012-10-23 MED ORDER — SODIUM CHLORIDE 0.9 % IV SOLN
INTRAVENOUS | Status: DC
  Administered 2012-10-23: 20:00:00 via INTRAVENOUS

## 2012-10-23 NOTE — ED Notes (Signed)
New and old EKG given to Dr. Jacubowitz. Copies placed in pt chart. 

## 2012-10-23 NOTE — ED Notes (Addendum)
Presents post biopsy from vocal chords yesterday, today began feeling "worse and worse. I can't stop shaking and trembling" pt is diaphoretic. Reports 3-4 episodes of black tarry stools today and weakness all over. Denies use of blood thinners. Last BM at 7 pm tonight, dark tarry stools

## 2012-10-23 NOTE — H&P (Signed)
PCP: Vertell [facility] Melvenia Beam, MD    Chief Complaint:  Black stool  HPI: Thomas Mcneil is a 62 y.o. male   has a past medical history of Depression; GERD (gastroesophageal reflux disease); Complication of anesthesia; Peripheral neuropathy; Arthritis; and Hepatitis.   Presented with  2 days ago patient had vocal chord polyps removal. After the procedure he started to have melena. He have been feeling weak and unwell reports some epigastric pain as well. He denies any nausea or vomiting. Have never had any GI bleeding in the past. He has hx of Hep C but have not required any treatment his LFT's, albumin and  INR wnl. He has been taking naproxen twice a day for 6-7 years for chronic arthritis and neuropathy pain, also has been on chronic PPI as well.  Denies any lightheadedness, no SOB, no Chest pain. No bright red blood per rectum no history of hematemesis.  He states that he had a colonoscopy done 1 year ago but he is not sure who did that.    Review of Systems:     Pertinent positives include: fatigue,  melena,  Constitutional:  No weight loss, night sweats, Fevers, chills,  weight loss  HEENT:  No headaches, Difficulty swallowing,Tooth/dental problems,Sore throat,  No sneezing, itching, ear ache, nasal congestion, post nasal drip,  Cardio-vascular:  No chest pain, Orthopnea, PND, anasarca, dizziness, palpitations.no Bilateral lower extremity swelling  GI:  No heartburn, indigestion, abdominal pain, nausea, vomiting, diarrhea, change in bowel habits, loss of appetite, blood in stool, hematemesis Resp:  no shortness of breath at rest. No dyspnea on exertion, No excess mucus, no productive cough, No non-productive cough, No coughing up of blood.No change in color of mucus.No wheezing. Skin:  no rash or lesions. No jaundice GU:  no dysuria, change in color of urine, no urgency or frequency. No straining to urinate.  No flank pain.  Musculoskeletal:  No joint  pain or no joint swelling. No decreased range of motion. No back pain.  Psych:  No change in mood or affect. No depression or anxiety. No memory loss.  Neuro: no localizing neurological complaints, no tingling, no weakness, no double vision, no gait abnormality, no slurred speech, no confusion  Otherwise ROS are negative except for above, 10 systems were reviewed  Past Medical History: Past Medical History  Diagnosis Date  . Depression   . GERD (gastroesophageal reflux disease)   . Complication of anesthesia     slow to wake up after last surgery  . Peripheral neuropathy     feet and hands  . Arthritis   . Hepatitis     hep c   Past Surgical History  Procedure Laterality Date  . Tonsillectomy    . Direct laryngoscopy  12/26/2011    Procedure: DIRECT LARYNGOSCOPY;  Surgeon: Melvenia Beam, MD;  Location: [facility] OR;  Service: ENT;  Laterality: N/A;  Directy Laryngoscopy with biopsy [phone]  . Direct laryngoscopy N/A 10/21/2012    Procedure: DIRECT LARYNGOSCOPY;  Surgeon: Melvenia Beam, MD;  Location: [facility] OR;  Service: ENT;  Laterality: N/A;     Medications: Prior to Admission medications   Medication Sig Start Date End Date Taking? Authorizing Provider  escitalopram (LEXAPRO) 20 MG tablet Take 20 mg by mouth daily.   Yes Historical Provider, MD  fluticasone (FLONASE) 50 MCG/ACT nasal spray Place 2 sprays into the nose daily.   Yes Historical Provider, MD  gabapentin (NEURONTIN) 600 MG tablet Take 600 mg by mouth  3 (three) times daily.   Yes Historical Provider, MD  hydrOXYzine (ATARAX/VISTARIL) 25 MG tablet Take 25 mg by mouth daily as needed for itching (for allergies). For allergies   Yes Historical Provider, MD  lovastatin (MEVACOR) 20 MG tablet Take 20 mg by mouth at bedtime.   Yes Historical Provider, MD  Multiple Vitamin (MULTIVITAMIN WITH MINERALS) TABS Take 1 tablet by mouth daily.   Yes Historical Provider, MD  naproxen (NAPROSYN) 500 MG tablet Take 500 mg by mouth 2 (two) times  daily with a meal.   Yes Historical Provider, MD  omeprazole (PRILOSEC) 40 MG capsule Take 40 mg by mouth daily.   Yes Historical Provider, MD  traZODone (DESYREL) 100 MG tablet Take 200 mg by mouth at bedtime.   Yes Historical Provider, MD  vitamin B-12 (CYANOCOBALAMIN) 1000 MCG tablet Take 1,000 mcg by mouth daily.   Yes Historical Provider, MD    Allergies:  No Known Allergies  Social History:  Ambulatory  independently   Lives at home alone   reports that he has been smoking Cigarettes.  He has a 11.25 pack-year smoking history. He does not have any smokeless tobacco history on file. He reports that he uses illicit drugs. He reports that he does not drink alcohol.   Family History: family history includes Hypertension in his mother.    Physical Exam: Patient Vitals for the past 24 hrs:  BP Temp Temp src Pulse Resp SpO2  10/23/12 2230 162/99 mmHg - - 65 19 95 %  10/23/12 2047 165/108 mmHg - - 92 - -  10/23/12 2045 173/107 mmHg - - 71 - -  10/23/12 2042 173/95 mmHg - - 63 - -  10/23/12 1933 181/106 mmHg 98 F (36.7 C) Oral 76 18 97 %    1. General:  in No Acute distress 2. Psychological: Alert and   Oriented 3. Head/ENT:   Moist   Mucous Membranes                          Head Non traumatic, neck supple                          Normal  Dentition 4. SKIN:   decreased Skin turgor,  Skin clean Dry and intact no rash 5. Heart: Regular rate and rhythm no Murmur, Rub or gallop 6. Lungs:   no wheezes occasional mild crackles   7. Abdomen: Soft, non-tender, Non distended, no epigastric tenderness. 8. Lower extremities: no clubbing, cyanosis, or edema 9. Neurologically Grossly intact, moving all 4 extremities equally 10. MSK: Normal range of motion  body mass index is unknown because there is no weight on file.   Labs on Admission:   Recent Labs  10/23/12 1941  NA 142  K 4.0  CL 107  CO2 26  GLUCOSE 121*  BUN 27*  CREATININE 1.50*  CALCIUM 10.0    Recent Labs   10/23/12 1941  AST 20  ALT 18  ALKPHOS 93  BILITOT 0.8  PROT 7.5  ALBUMIN 4.0   No results found for this basename: LIPASE, AMYLASE,  in the last 72 hours  Recent Labs  10/23/12 1941  WBC 9.1  HGB 14.8  HCT 42.5  MCV 86.7  PLT 197   No results found for this basename: CKTOTAL, CKMB, CKMBINDEX, TROPONINI,  in the last 72 hours No results found for this basename: TSH, T4TOTAL, FREET3, T3FREE, THYROIDAB,  in the last 72 hours No results found for this basename: VITAMINB12, FOLATE, FERRITIN, TIBC, IRON, RETICCTPCT,  in the last 72 hours No results found for this basename: HGBA1C    The CrCl is unknown because both a height and weight (above a minimum accepted value) are required for this calculation. ABG No results found for this basename: phart, pco2, po2, hco3, tco2, acidbasedef, o2sat     No results found for this basename: DDIMER     Other results:  I have pearsonaly reviewed this: ECG REPORT  Rate:62  Rhythm: NSR ST&T Change: no ischemia Fecal Occult Bld POSITIVE (A)   Cultures: No results found for this basename: sdes, specrequest, cult, reptstatus       Radiological Exams on Admission: No results found.  Chart has been reviewed  Assessment/Plan  62 year old gentleman with history of hepatitis C and alcoholism but no evidence of liver failure presents with melena x3 days stable hemoglobin and vitals.  Present on Admission:   . Melena -  Possible upper Gi bleed but given recent procedure ENT should be notified.  Monitor in step down, Gi is aware and will see in AM. Protonix gtt. Serial CBC. Monitor vitals.  Tobacco abuse - nicotine patch.  Hx of Hepatitis C - no evidence of liver disease.    Prophylaxis: SCD   Protonix  CODE STATUS: FULL CODE  Other plan as per orders.  I have spent a total of 65 min on this admission - time taken to discuss care with GI  Eira Alpert 10/23/2012, 10:45 PM

## 2012-10-23 NOTE — ED Provider Notes (Signed)
History     CSN: [id number]  Arrival date & time 10/23/12  Thomas Mcneil   First MD Initiated Contact with Patient 10/23/12 1941      Chief Complaint  Patient presents with  . Rectal Bleeding    (Consider location/radiation/quality/duration/timing/severity/associated sxs/prior treatment) HPI Complains of rectal bleeding onset proximate 2 days ago accompanied by diffuse chest pain and diffuse abdominal pain. Pain is moderate Nothing makes symptoms better or worse. Stools been black for 2 days. Denies shortness of breath denies other complaint. No treatment prior to coming here. No fever no other associated symptoms. Past Medical History  Diagnosis Date  . Depression   . GERD (gastroesophageal reflux disease)   . Complication of anesthesia     slow to wake up after last surgery  . Peripheral neuropathy     feet and hands  . Arthritis   . Hepatitis     hep c    Past Surgical History  Procedure Laterality Date  . Tonsillectomy    . Direct laryngoscopy  12/26/2011    Procedure: DIRECT LARYNGOSCOPY;  Surgeon: Melvenia Beam, MD;  Location: [facility] OR;  Service: ENT;  Laterality: N/A;  Directy Laryngoscopy with biopsy [phone]  . Direct laryngoscopy N/A 10/21/2012    Procedure: DIRECT LARYNGOSCOPY;  Surgeon: Melvenia Beam, MD;  Location: [facility] OR;  Service: ENT;  Laterality: N/A;    History reviewed. No pertinent family history.  History  Substance Use Topics  . Smoking status: Current Every Day Smoker -- 0.25 packs/day for 45 years    Types: Cigarettes  . Smokeless tobacco: Not on file  . Alcohol Use: No     Comment: quit       Review of Systems  Constitutional: Negative.   HENT: Negative.   Respiratory: Negative.   Cardiovascular: Positive for chest pain.  Gastrointestinal: Positive for abdominal pain and blood in stool.  Musculoskeletal: Negative.   Skin: Negative.   Neurological: Negative.   Psychiatric/Behavioral: Negative.   All other systems reviewed and are  negative.    Allergies  Review of patient's allergies indicates no known allergies.  Home Medications   Current Outpatient Rx  Name  Route  Sig  Dispense  Refill  . escitalopram (LEXAPRO) 20 MG tablet   Oral   Take 20 mg by mouth daily.         . fluticasone (FLONASE) 50 MCG/ACT nasal spray   Nasal   Place 2 sprays into the nose daily.         Marland Kitchen gabapentin (NEURONTIN) 600 MG tablet   Oral   Take 600 mg by mouth 3 (three) times daily.         . hydrOXYzine (ATARAX/VISTARIL) 25 MG tablet   Oral   Take 25 mg by mouth daily as needed for itching (for allergies). For allergies         . lovastatin (MEVACOR) 20 MG tablet   Oral   Take 20 mg by mouth at bedtime.         . Multiple Vitamin (MULTIVITAMIN WITH MINERALS) TABS   Oral   Take 1 tablet by mouth daily.         . naproxen (NAPROSYN) 500 MG tablet   Oral   Take 500 mg by mouth 2 (two) times daily with a meal.         . omeprazole (PRILOSEC) 40 MG capsule   Oral   Take 40 mg by mouth daily.         Marland Kitchen  traZODone (DESYREL) 100 MG tablet   Oral   Take 200 mg by mouth at bedtime.         . vitamin B-12 (CYANOCOBALAMIN) 1000 MCG tablet   Oral   Take 1,000 mcg by mouth daily.           BP 181/106  Pulse 76  Temp(Src) 98 F (36.7 C) (Oral)  Resp 18  SpO2 97%  Physical Exam  Nursing note and vitals reviewed. Constitutional: He appears well-developed and well-nourished.  HENT:  Head: Normocephalic and atraumatic.  Eyes: Conjunctivae are normal. Pupils are equal, round, and reactive to light.  Neck: Neck supple. No tracheal deviation present. No thyromegaly present.  Cardiovascular: Normal rate and regular rhythm.   No murmur heard. Pulmonary/Chest: Effort normal and breath sounds normal.  Abdominal: Soft. Bowel sounds are normal. He exhibits no distension. There is no tenderness.  Genitourinary: Guaiac positive stool.  Normal tone black stool Hemoccult positive  Musculoskeletal: Normal  range of motion. He exhibits no edema and no tenderness.  Neurological: He is alert. Coordination normal.  Skin: Skin is warm and dry. No rash noted.  Psychiatric: He has a normal mood and affect.    ED Course  Procedures (including critical care time)  Labs Reviewed  CBC  COMPREHENSIVE METABOLIC PANEL  TYPE AND SCREEN   No results found.   No diagnosis found.  Date: 10/23/2012  Rate: 65  Rhythm: normal sinus rhythm  QRS Axis: normal  Intervals: normal  ST/T Wave abnormalities: early repolarization  Conduction Disutrbances:none  Narrative Interpretation:   Old EKG Reviewed: Unchanged from 03/05/2010 interpreted by me  Results for orders placed during the hospital encounter of 10/23/12  CBC      Result Value Range   WBC 9.1  4.0 - 10.5 K/uL   RBC 4.90  4.22 - 5.81 MIL/uL   Hemoglobin 14.8  13.0 - 17.0 g/dL   HCT 40.9  81.1 - 91.4 %   MCV 86.7  78.0 - 100.0 fL   MCH 30.2  26.0 - 34.0 pg   MCHC 34.8  30.0 - 36.0 g/dL   RDW 78.2  95.6 - 21.3 %   Platelets 197  150 - 400 K/uL  COMPREHENSIVE METABOLIC PANEL      Result Value Range   Sodium 142  135 - 145 mEq/L   Potassium 4.0  3.5 - 5.1 mEq/L   Chloride 107  96 - 112 mEq/L   CO2 26  19 - 32 mEq/L   Glucose, Bld 121 (*) 70 - 99 mg/dL   BUN 27 (*) 6 - 23 mg/dL   Creatinine, Ser 0.86 (*) 0.50 - 1.35 mg/dL   Calcium 57.8  8.4 - 46.9 mg/dL   Total Protein 7.5  6.0 - 8.3 g/dL   Albumin 4.0  3.5 - 5.2 g/dL   AST 20  0 - 37 U/L   ALT 18  0 - 53 U/L   Alkaline Phosphatase 93  39 - 117 U/L   Total Bilirubin 0.8  0.3 - 1.2 mg/dL   GFR calc non Af Amer 48 (*) >90 mL/min   GFR calc Af Amer 56 (*) >90 mL/min  PROTIME-INR      Result Value Range   Prothrombin Time 13.1  11.6 - 15.2 seconds   INR 1.00  0.00 - 1.49  OCCULT BLOOD, POC DEVICE      Result Value Range   Fecal Occult Bld POSITIVE (*) NEGATIVE  POCT I-STAT TROPONIN I  Result Value Range   Troponin i, poc 0.00  0.00 - 0.08 ng/mL   Comment 3           TYPE  AND SCREEN      Result Value Range   ABO/RH(D) A NEG     Antibody Screen NEG     Sample Expiration 10/26/2012    ABO/RH      Result Value Range   ABO/RH(D) A NEG     No results found.  21:20 PM pain improved after treatment with intravenous morphine. Additional morphine ordered patient alert Glasgow Coma Score 15. MDM  Case discussed with Dr.Doutova plan admit step down unit Diagnosis #1 acute gastrointestinal bleed 2 chest pain 3 abdominal pain 4 renal insufficiency        Doug Sou, MD 10/23/12 2210

## 2012-10-24 DIAGNOSIS — B1710 Acute hepatitis C without hepatic coma: Secondary | ICD-10-CM

## 2012-10-24 DIAGNOSIS — K921 Melena: Secondary | ICD-10-CM

## 2012-10-24 DIAGNOSIS — K922 Gastrointestinal hemorrhage, unspecified: Secondary | ICD-10-CM

## 2012-10-24 DIAGNOSIS — K219 Gastro-esophageal reflux disease without esophagitis: Secondary | ICD-10-CM

## 2012-10-24 LAB — CBC
MCH: 30.6 pg (ref 26.0–34.0)
MCHC: 35.8 g/dL (ref 30.0–36.0)
Platelets: 168 10*3/uL (ref 150–400)
Platelets: 172 10*3/uL (ref 150–400)
RDW: 13.1 % (ref 11.5–15.5)
WBC: 8.7 10*3/uL (ref 4.0–10.5)

## 2012-10-24 LAB — COMPREHENSIVE METABOLIC PANEL
Albumin: 3.4 g/dL — ABNORMAL LOW (ref 3.5–5.2)
BUN: 21 mg/dL (ref 6–23)
Calcium: 9.4 mg/dL (ref 8.4–10.5)
GFR calc non Af Amer: 50 mL/min — ABNORMAL LOW (ref >90)
Glucose, Bld: 97 mg/dL (ref 70–99)
Potassium: 3.9 mEq/L (ref 3.5–5.1)
Total Bilirubin: 1 mg/dL (ref 0.3–1.2)
Total Protein: 6.7 g/dL (ref 6.0–8.3)

## 2012-10-24 LAB — PHOSPHORUS: Phosphorus: 3.3 mg/dL (ref 2.3–4.6)

## 2012-10-24 LAB — MAGNESIUM: Magnesium: 1.9 mg/dL (ref 1.5–2.5)

## 2012-10-24 MED ORDER — ACETAMINOPHEN 325 MG PO TABS: 650.0000 mg | ORAL_TABLET | Freq: Four times a day (QID) | ORAL | Status: DC | PRN

## 2012-10-24 MED ORDER — SODIUM CHLORIDE 0.9 % IV SOLN
INTRAVENOUS | Status: AC
  Administered 2012-10-24: 01:00:00 via INTRAVENOUS

## 2012-10-24 MED ORDER — HYDROXYZINE HCL 25 MG PO TABS
25.0000 mg | ORAL_TABLET | Freq: Every day | ORAL | Status: DC | PRN
  Filled 2012-10-24: qty 1

## 2012-10-24 MED ORDER — ONDANSETRON HCL 4 MG/2ML IJ SOLN: 4.0000 mg | Freq: Four times a day (QID) | INTRAMUSCULAR | Status: DC | PRN

## 2012-10-24 MED ORDER — TRAZODONE HCL 100 MG PO TABS
200.0000 mg | ORAL_TABLET | Freq: Every day | ORAL | Status: DC
  Administered 2012-10-24 – 2012-10-25 (×3): 200 mg via ORAL
  Filled 2012-10-24 (×4): qty 2

## 2012-10-24 MED ORDER — SODIUM CHLORIDE 0.9 % IV SOLN
8.0000 mg/h | INTRAVENOUS | Status: DC
  Administered 2012-10-24: 8 mg/h via INTRAVENOUS
  Filled 2012-10-24 (×3): qty 80

## 2012-10-24 MED ORDER — SODIUM CHLORIDE 0.9 % IJ SOLN
3.0000 mL | Freq: Two times a day (BID) | INTRAMUSCULAR | Status: DC
  Administered 2012-10-24 – 2012-10-25 (×4): 3 mL via INTRAVENOUS

## 2012-10-24 MED ORDER — SODIUM CHLORIDE 0.9 % IV SOLN
INTRAVENOUS | Status: DC
  Administered 2012-10-24 (×2): via INTRAVENOUS

## 2012-10-24 MED ORDER — PANTOPRAZOLE SODIUM 40 MG IV SOLR
40.0000 mg | Freq: Once | INTRAVENOUS | Status: AC
  Administered 2012-10-24: 40 mg via INTRAVENOUS
  Filled 2012-10-24: qty 40

## 2012-10-24 MED ORDER — PANTOPRAZOLE SODIUM 40 MG IV SOLR
40.0000 mg | Freq: Two times a day (BID) | INTRAVENOUS | Status: DC
  Administered 2012-10-24 – 2012-10-26 (×5): 40 mg via INTRAVENOUS
  Filled 2012-10-24 (×6): qty 40

## 2012-10-24 MED ORDER — HYDROCODONE-ACETAMINOPHEN 5-325 MG PO TABS
1.0000 | ORAL_TABLET | ORAL | Status: DC | PRN
  Administered 2012-10-24: 2 via ORAL
  Filled 2012-10-24: qty 2

## 2012-10-24 MED ORDER — ESCITALOPRAM OXALATE 20 MG PO TABS
20.0000 mg | ORAL_TABLET | Freq: Every day | ORAL | Status: DC
  Administered 2012-10-24 – 2012-10-26 (×3): 20 mg via ORAL
  Filled 2012-10-24 (×3): qty 1

## 2012-10-24 MED ORDER — DOCUSATE SODIUM 100 MG PO CAPS
100.0000 mg | ORAL_CAPSULE | Freq: Two times a day (BID) | ORAL | Status: DC
  Administered 2012-10-24 – 2012-10-26 (×5): 100 mg via ORAL
  Filled 2012-10-24 (×6): qty 1

## 2012-10-24 MED ORDER — NICOTINE 21 MG/24HR TD PT24
21.0000 mg | MEDICATED_PATCH | Freq: Every day | TRANSDERMAL | Status: DC
  Administered 2012-10-24 – 2012-10-26 (×3): 21 mg via TRANSDERMAL
  Filled 2012-10-24 (×3): qty 1

## 2012-10-24 MED ORDER — MORPHINE SULFATE 4 MG/ML IJ SOLN: 4.0000 mg | INTRAMUSCULAR | Status: DC | PRN

## 2012-10-24 MED ORDER — ONDANSETRON HCL 4 MG PO TABS: 4.0000 mg | ORAL_TABLET | Freq: Four times a day (QID) | ORAL | Status: DC | PRN

## 2012-10-24 MED ORDER — ACETAMINOPHEN 650 MG RE SUPP: 650.0000 mg | Freq: Four times a day (QID) | RECTAL | Status: DC | PRN

## 2012-10-24 MED ORDER — SIMVASTATIN 10 MG PO TABS
10.0000 mg | ORAL_TABLET | Freq: Every day | ORAL | Status: DC
  Administered 2012-10-24: 10 mg via ORAL
  Filled 2012-10-24 (×3): qty 1

## 2012-10-24 MED ORDER — FLUTICASONE PROPIONATE 50 MCG/ACT NA SUSP
2.0000 | Freq: Every day | NASAL | Status: DC
  Administered 2012-10-24 – 2012-10-26 (×3): 2 via NASAL
  Filled 2012-10-24: qty 16

## 2012-10-24 MED ORDER — GABAPENTIN 600 MG PO TABS
600.0000 mg | ORAL_TABLET | Freq: Three times a day (TID) | ORAL | Status: DC
  Administered 2012-10-24 – 2012-10-26 (×6): 600 mg via ORAL
  Filled 2012-10-24 (×9): qty 1

## 2012-10-24 NOTE — Consult Note (Signed)
[facility] Consultation Covering for Drs. Mann/Hung  Referring Provider:   [facility]   Primary Care Physician:  Melvenia Beam, MD Primary Gastroenterologist:    Unknown      Reason for Consultation :    GI bleed          HPI:   Patient is a 62 year old male presenting admitted last night with melena and epigastric pain. He has been taking Naproxen for arthritis and neuropathic pain but takes a PPI (for GERD) as well. Patient had a vocal cord biopsy a few days ago. Following that he developed black stools x 3. He reports more of "hunger feeling" in upper abdomen rather than actual pain and loss of appetite. No nausea.   Past Medical History  Diagnosis Date  . Depression   . GERD (gastroesophageal reflux disease)   . Complication of anesthesia     slow to wake up after last surgery  . Peripheral neuropathy     feet and hands  . Arthritis   . Hepatitis     hep c    Past Surgical History  Procedure Laterality Date  . Tonsillectomy    . Direct laryngoscopy  12/26/2011    Procedure: DIRECT LARYNGOSCOPY;  Surgeon: Melvenia Beam, MD;  Location: [facility] OR;  Service: ENT;  Laterality: N/A;  Directy Laryngoscopy with biopsy [phone]  . Direct laryngoscopy N/A 10/21/2012    Procedure: DIRECT LARYNGOSCOPY;  Surgeon: Melvenia Beam, MD;  Location: [facility] OR;  Service: ENT;  Laterality: N/A;    Family History  Problem Relation Age of Onset  . Hypertension Mother    No colon cancer or other GI illnesssess  History  Substance Use Topics  . Smoking status: Current Every Day Smoker -- 0.25 packs/day for 45 years    Types: Cigarettes  . Smokeless tobacco: Not on file  . Alcohol Use: No     Comment: Quit EtOH 1 year ago, used to be a heavy drinker    Prior to Admission medications   Medication Sig Start Date End Date Taking? Authorizing Provider  escitalopram (LEXAPRO) 20 MG tablet Take 20 mg by mouth daily.   Yes Historical Provider, MD  fluticasone (FLONASE) 50 MCG/ACT nasal  spray Place 2 sprays into the nose daily.   Yes Historical Provider, MD  gabapentin (NEURONTIN) 600 MG tablet Take 600 mg by mouth 3 (three) times daily.   Yes Historical Provider, MD  hydrOXYzine (ATARAX/VISTARIL) 25 MG tablet Take 25 mg by mouth daily as needed for itching (for allergies). For allergies   Yes Historical Provider, MD  lovastatin (MEVACOR) 20 MG tablet Take 20 mg by mouth at bedtime.   Yes Historical Provider, MD  Multiple Vitamin (MULTIVITAMIN WITH MINERALS) TABS Take 1 tablet by mouth daily.   Yes Historical Provider, MD  naproxen (NAPROSYN) 500 MG tablet Take 500 mg by mouth 2 (two) times daily with a meal.   Yes Historical Provider, MD  omeprazole (PRILOSEC) 40 MG capsule Take 40 mg by mouth daily.   Yes Historical Provider, MD  traZODone (DESYREL) 100 MG tablet Take 200 mg by mouth at bedtime.   Yes Historical Provider, MD  vitamin B-12 (CYANOCOBALAMIN) 1000 MCG tablet Take 1,000 mcg by mouth daily.   Yes Historical Provider, MD    Current Facility-Administered Medications  Medication Dose Route Frequency Provider Last Rate Last Dose  . 0.9 %  sodium chloride infusion   Intravenous Continuous Therisa Doyne, MD 75 mL/hr at 10/24/12 0047    .  acetaminophen (TYLENOL) tablet 650 mg  650 mg Oral Q6H PRN Therisa Doyne, MD       Or  . acetaminophen (TYLENOL) suppository 650 mg  650 mg Rectal Q6H PRN Therisa Doyne, MD      . docusate sodium (COLACE) capsule 100 mg  100 mg Oral BID Therisa Doyne, MD      . escitalopram (LEXAPRO) tablet 20 mg  20 mg Oral Daily Therisa Doyne, MD      . fluticasone (FLONASE) 50 MCG/ACT nasal spray 2 spray  2 spray Each Nare Daily Therisa Doyne, MD      . gabapentin (NEURONTIN) tablet 600 mg  600 mg Oral TID Therisa Doyne, MD      . HYDROcodone-acetaminophen (NORCO/VICODIN) 5-325 MG per tablet 1-2 tablet  1-2 tablet Oral Q4H PRN Therisa Doyne, MD   2 tablet at 10/24/12 0051  . hydrOXYzine (ATARAX/VISTARIL) tablet  25 mg  25 mg Oral Daily PRN Therisa Doyne, MD      . morphine 4 MG/ML injection 4 mg  4 mg Intravenous Q4H PRN Therisa Doyne, MD      . nicotine (NICODERM CQ - dosed in mg/24 hours) patch 21 mg  21 mg Transdermal Daily Therisa Doyne, MD   21 mg at 10/24/12 0235  . ondansetron (ZOFRAN) tablet 4 mg  4 mg Oral Q6H PRN Therisa Doyne, MD       Or  . ondansetron (ZOFRAN) injection 4 mg  4 mg Intravenous Q6H PRN Therisa Doyne, MD      . pantoprazole (PROTONIX) 80 mg in sodium chloride 0.9 % 250 mL infusion  8 mg/hr Intravenous Continuous Therisa Doyne, MD 25 mL/hr at 10/24/12 0102 8 mg/hr at 10/24/12 0102  . simvastatin (ZOCOR) tablet 10 mg  10 mg Oral q1800 Therisa Doyne, MD      . sodium chloride 0.9 % injection 3 mL  3 mL Intravenous Q12H Therisa Doyne, MD   3 mL at 10/24/12 0047  . traZODone (DESYREL) tablet 200 mg  200 mg Oral QHS Therisa Doyne, MD   200 mg at 10/24/12 0106    Allergies as of 10/23/2012  . (No Known Allergies)   Review of Systems:    All systems reviewed and negative except where noted in HPI.    Physical Exam:  Vital signs in last 24 hours: Temp:  [97.9 F (36.6 C)-98.6 F (37 C)] 97.9 F (36.6 C) (05/18 0713) Pulse Rate:  [57-92] 57 (05/18 0713) Resp:  [12-20] 18 (05/18 0713) BP: (137-195)/(71-108) 140/77 mmHg (05/18 0713) SpO2:  [93 %-98 %] 96 % (05/18 0713) Weight:  [199 lb 8.3 oz (90.5 kg)] 199 lb 8.3 oz (90.5 kg) (05/17 2345) Last BM Date: 10/23/12 General:   Pleasant black male in NAD Head:  Normocephalic and atraumatic. Eyes:   No icterus.   Conjunctiva pink. Ears:  Normal auditory acuity. Neck:  Supple; no masses felt Lungs:  Respirations even and unlabored. Lungs clear to auscultation bilaterally.   No wheezes, crackles, or rhonchi.  Heart:  Regular rate and rhythm. Abdomen:  Soft, nondistended, nontender. Normal bowel sounds. No appreciable masses or hepatomegaly.  Rectal:  Flecks of black debris, heme  positiveNot  Msk:  Symmetrical without gross deformities.  Extremities:  Without edema. Neurologic:  Alert and  oriented x4;  grossly normal neurologically. Skin:  Intact without significant lesions or rashes. Extensive tatoos Cervical Nodes:  No significant cervical adenopathy. Psych:  Alert and cooperative. Normal affect.  LAB RESULTS:  Recent Labs  10/23/12 1941  10/24/12 0125 10/24/12 0550  WBC 9.1 9.1 8.7  HGB 14.8 13.8 13.4  HCT 42.5 38.6* 37.3*  PLT 197 168 172   BMET  Recent Labs  10/23/12 1941 10/24/12 0550  NA 142 144  K 4.0 3.9  CL 107 110  CO2 26 23  GLUCOSE 121* 97  BUN 27* 21  CREATININE 1.50* 1.45*  CALCIUM 10.0 9.4   LFT  Recent Labs  10/24/12 0550  PROT 6.7  ALBUMIN 3.4*  AST 16  ALT 13  ALKPHOS 82  BILITOT 1.0   PT/INR  Recent Labs  10/23/12 2010  LABPROT 13.1  INR 1.00    PREVIOUS ENDOSCOPIES:            reports normal colonoscopy one year ago with unknown group    Impression / Plan:   1. Melena. Bleeding possibly from erosive disease or PUD from NSAIDS. Bleeding started after vocal cord biopsy but not sure that would be source of bleeding.  His hgb is stable at13.4 with normal BUN and he hasn't had BM today. GI bleed seems to be low volume at this point. Will schedule for EGD tomorrow. For now he can have clears, will d/c PPI drip and give IV BID PPI.    2. History of HCV, untreated. LFTs , INR okay. Platelets normal. Liver biopsy 2010 c/w minimal activity, Grade I, no increased fibrosis, stage 0, 1+ siderosis.  Thanks   LOS: 1 day   Willette Cluster  10/24/2012, 8:54 AM    Attending physician's note   I have taken a history, examined the patient and reviewed the chart. I agree with the Advanced Practitioner's note, impression and recommendations. Melena and anorexia in a patient on naprosyn. R/O ulcer, gastritis. IV PPI, DC NSAIDs, clear liquids today. Monitor Hb. EGD tomorrow with Drs Mann/Hung.  Meryl Dare, MD  Clementeen Graham

## 2012-10-24 NOTE — Progress Notes (Signed)
[facility] Progress Note [facility] TEAM 1 - Stepdown/ICU TEAM   Celeste Candelas XLK:[id number] DOB: 19-Jul-1950 DOA: 10/23/2012 PCP: Melvenia Beam, MD  Brief narrative: 62 y.o. male w/ medical history of Depression; GERD; Peripheral neuropathy; Arthritis; and Hepatitis who presented with c/o melena.  2 days prior to presentation (10/21/2012) the patient had a vocal chord polyp removed (path was benign). He had been feeling weak and unwell adn reported some epigastric pain. He denied nausea or vomiting. Had never had any GI bleeding in the past. He hx of Hep C but had not required any treatment - his LFT's, albumin and INR wnl.  He had been taking naproxen twice a day for 6-7 years for chronic arthritis and neuropathy pain, along with chronic PPI.  Denies any lightheadedness, no SOB, no Chest pain. No bright red blood per rectum no history of hematemesis.  He stated he had a colonoscopy done 1 year ago but he is not sure of the results or the performing MD.  Assessment/Plan:  Melena w/ epigastric pain  Despite melena, pt is NOT anemic - he is HD stable - doubt ENT procedure linked as pt has had not hemoptysis - suspect NSAID related ulcer or gastritis or even esoph disease - GI to pursue EGD in AM - agree protonix gtt is not indicated  Mild acute renal failure Most c/w pre-renal etiolgy - hydrate and follow trend - noted baseline crt in July 2013 was elevated at 1.6  Hepatitis C Liver biopsy 2010 c/w minimal activity, Grade I, no increased fibrosis, stage 0, 1+ siderosis  Tobacco abuse Counseled on need for abstinence  Prior hx of EtOH abuse Not at risk for DTs as last drink was >1 year ago  Code Status: FULL Family Communication: no family present at time of exam Disposition Plan: stable for transfer to medical bed  Consultants: GI - [facility]  Procedures: none  Antibiotics: none  DVT prophylaxis: SCDs  HPI/Subjective: Pt is resting comfortably in bed.  He has not had a  BM since his admit.  He denies hemoptysis or hematemesis.  His epigastric pain has resolved.  He is very hungry.   Objective: Blood pressure 140/77, pulse 57, temperature 97.9 F (36.6 C), temperature source Oral, resp. rate 18, height 6' (1.829 m), weight 90.5 kg (199 lb 8.3 oz), SpO2 96.00%.  Intake/Output Summary (Last 24 hours) at 10/24/12 1108 Last data filed at 10/24/12 0600  Gross per 24 hour  Intake 575.42 ml  Output    600 ml  Net -24.58 ml   Exam: General: No acute respiratory distress Lungs: Clear to auscultation bilaterally without wheezes or crackles Cardiovascular: Regular rate and rhythm without murmur gallop or rub normal S1 and S2 Abdomen: Nontender, nondistended, soft, bowel sounds positive, no rebound, no ascites, no appreciable mass Extremities: No significant cyanosis, clubbing, or edema bilateral lower extremities  Data Reviewed: Basic Metabolic Panel:  Recent Labs Lab 10/23/12 1941 10/24/12 0550  NA 142 144  K 4.0 3.9  CL 107 110  CO2 26 23  GLUCOSE 121* 97  BUN 27* 21  CREATININE 1.50* 1.45*  CALCIUM 10.0 9.4  MG  --  1.9  PHOS  --  3.3   Liver Function Tests:  Recent Labs Lab 10/23/12 1941 10/24/12 0550  AST 20 16  ALT 18 13  ALKPHOS 93 82  BILITOT 0.8 1.0  PROT 7.5 6.7  ALBUMIN 4.0 3.4*   CBC:  Recent Labs Lab 10/23/12 1941 10/24/12 0125 10/24/12 0550  WBC  9.1 9.1 8.7  HGB 14.8 13.8 13.4  HCT 42.5 38.6* 37.3*  MCV 86.7 85.6 85.9  PLT 197 168 172    Recent Results (from the past 240 hour(s))  MRSA PCR SCREENING     Status: None   Collection Time    10/23/12 11:33 PM      Result Value Range Status   MRSA by PCR NEGATIVE  NEGATIVE Final   Comment:            The GeneXpert MRSA Assay (FDA     approved for NASAL specimens     only), is one component of a     comprehensive MRSA colonization     surveillance program. It is not     intended to diagnose MRSA     infection nor to guide or     monitor treatment for      MRSA infections.     Studies:  Recent x-ray studies have been reviewed in detail by the Attending Physician  Scheduled Meds:  Scheduled Meds: . docusate sodium  100 mg Oral BID  . escitalopram  20 mg Oral Daily  . fluticasone  2 spray Each Nare Daily  . gabapentin  600 mg Oral TID  . nicotine  21 mg Transdermal Daily  . pantoprazole (PROTONIX) IV  40 mg Intravenous Q12H  . simvastatin  10 mg Oral q1800  . sodium chloride  3 mL Intravenous Q12H  . traZODone  200 mg Oral QHS     Time spent on care of this patient:   [facility] T  [facility] Office  [phone] Pager - Text Page per Loretha Stapler as per below:  On-Call/Text Page:      [email or web address]      password [email or web address]  If 7PM-7AM, please contact night-coverage [email or web address] Password [email or web address] 10/24/2012, 11:08 AM   LOS: 1 day

## 2012-10-24 NOTE — Progress Notes (Signed)
NURSING PROGRESS NOTE  Thomas Mcneil [id number] Transfer Data: 10/24/2012 3:38 PM Attending Provider: Lonia Blood, MD WUJ:WJXB, [name], MD Code Status: full   Thomas Mcneil is a 62 y.o. male patient transferred from 2900  -No acute distress noted.  -No complaints of shortness of breath.  -No complaints of chest pain.    Blood pressure 154/96, pulse 58, temperature 98.1 F (36.7 C), temperature source Oral, resp. rate 18, height 6' (1.829 m), weight 90.5 kg (199 lb 8.3 oz), SpO2 98.00%.   IV Fluids:  IV in place, occlusive dsg intact without redness, IV cath forearm left, condition patent and no redness and antecubital left, condition patent and no redness normal saline.   Allergies:  Review of patient's allergies indicates no known allergies.  Past Medical History:   has a past medical history of Depression; GERD (gastroesophageal reflux disease); Complication of anesthesia; Peripheral neuropathy; Arthritis; and Hepatitis.  Past Surgical History:   has past surgical history that includes Tonsillectomy; Direct laryngoscopy (12/26/2011); and Direct laryngoscopy (N/A, 10/21/2012).  Social History:   reports that he has been smoking Cigarettes.  He has a 11.25 pack-year smoking history. He does not have any smokeless tobacco history on file. He reports that he uses illicit drugs. He reports that he does not drink alcohol.  Skin: intact  Patient/Family orientated to room. Information packet given to patient/family. Admission inpatient armband information verified with patient/family to include name and date of birth and placed on patient arm. Side rails up x 2, fall assessment and education completed with patient/family. Patient/family able to verbalize understanding of risk associated with falls and verbalized understanding to call for assistance before getting out of bed. Call light within reach. Patient/family able to voice and demonstrate understanding of unit orientation instructions.     Will continue to evaluate and treat per MD orders.  Madelin Rear, MSN, RN, [employer]

## 2012-10-25 ENCOUNTER — Encounter (HOSPITAL_COMMUNITY): Payer: Self-pay | Admitting: *Deleted

## 2012-10-25 ENCOUNTER — Encounter (HOSPITAL_COMMUNITY)
Admission: RE | Disposition: A | Discharge status: Home / Self Care | Payer: Self-pay | Source: Ambulatory Visit | Attending: Internal Medicine

## 2012-10-25 HISTORY — PX: ESOPHAGOGASTRODUODENOSCOPY: SHX5428

## 2012-10-25 LAB — CBC
Hemoglobin: 13.4 g/dL (ref 13.0–17.0)
MCH: 30 pg (ref 26.0–34.0)
MCHC: 34.5 g/dL (ref 30.0–36.0)

## 2012-10-25 LAB — BASIC METABOLIC PANEL
Calcium: 9.3 mg/dL (ref 8.4–10.5)
GFR calc non Af Amer: 48 mL/min — ABNORMAL LOW (ref >90)
Glucose, Bld: 91 mg/dL (ref 70–99)
Sodium: 140 mEq/L (ref 135–145)

## 2012-10-25 SURGERY — EGD (ESOPHAGOGASTRODUODENOSCOPY)
Anesthesia: Moderate Sedation

## 2012-10-25 MED ORDER — MIDAZOLAM HCL 10 MG/2ML IJ SOLN
INTRAMUSCULAR | Status: DC | PRN
  Administered 2012-10-25 (×3): 2 mg via INTRAVENOUS

## 2012-10-25 MED ORDER — LIDOCAINE VISCOUS 2 % MT SOLN
OROMUCOSAL | Status: DC | PRN
  Administered 2012-10-25: 10 mL via OROMUCOSAL

## 2012-10-25 MED ORDER — BIOTENE DRY MOUTH MT LIQD: 15.0000 mL | Freq: Two times a day (BID) | OROMUCOSAL | Status: DC

## 2012-10-25 MED ORDER — SODIUM CHLORIDE 0.9 % IV SOLN
INTRAVENOUS | Status: DC
  Administered 2012-10-25: 500 mL via INTRAVENOUS

## 2012-10-25 MED ORDER — MIDAZOLAM HCL 5 MG/ML IJ SOLN
INTRAMUSCULAR | Status: AC
  Filled 2012-10-25: qty 2

## 2012-10-25 MED ORDER — LIDOCAINE VISCOUS 2 % MT SOLN
OROMUCOSAL | Status: AC
  Filled 2012-10-25: qty 15

## 2012-10-25 MED ORDER — FENTANYL CITRATE 0.05 MG/ML IJ SOLN
INTRAMUSCULAR | Status: DC | PRN
  Administered 2012-10-25 (×3): 25 ug via INTRAVENOUS

## 2012-10-25 MED ORDER — CHLORHEXIDINE GLUCONATE 0.12 % MT SOLN
15.0000 mL | Freq: Two times a day (BID) | OROMUCOSAL | Status: DC
  Administered 2012-10-25: 15 mL via OROMUCOSAL
  Filled 2012-10-25 (×3): qty 15

## 2012-10-25 MED ORDER — FENTANYL CITRATE 0.05 MG/ML IJ SOLN
INTRAMUSCULAR | Status: AC
  Filled 2012-10-25: qty 4

## 2012-10-25 NOTE — Op Note (Addendum)
[facility] [address] [place], [address]   OPERATIVE PROCEDURE REPORT  PATIENT :Thomas, Mcneil  MR#: [id number] BIRTHDATE :November 24, 1950 GENDER: Male ENDOSCOPIST: Dr.  Lorenza Burton, MD ASSISTANT:   Beryle Beams, technician Jamal Maes, RN PROCEDURE DATE: 11-14-12 PRE-PROCEDURE PREPERATION: Patient fasted for 4 hours prior to procedure. PRE-PROCEDURE PHYSICAL: Patient has stable vital signs.  Neck is supple.  There is no JVD, thyromegaly or LAD.  Chest clear to auscultation.  S1 and S2 regular.  Abdomen soft, non-distended, non-tender with NABS. PROCEDURE:     EGD w/ biopsy ASA CLASS:     Class III INDICATIONS:     Melena, GERD, Hepatiotis C. MEDICATIONS:     Fentanyl 75 mcg IV and Versed 5 mg IV TOPICAL ANESTHETIC:   Viscous Xylocaine-15 cc PO.  DESCRIPTION OF PROCEDURE: After the risks benefits and alternatives of the procedure were thoroughly explained, informed consent was obtained.  The Pentax Gastroscope [id number]  was introduced through the mouth and advanced to the second portion of the duodenum , without limitations. The instrument was slowly withdrawn as the mucosa was fully examined.   The esophagus and the GEJ appeared normal. Moderate diffuse gastritis was noted with a fleshy antral fold that was biopseid x 3. Antral gastritis was noted as well. Mild duodenitis was also noted in the proximal small bowel. There were no ulcers, masses or polyps noted. Retroflexed views revealed a small hiatl hernia. The scope was then withdrawn from the patient and the procedure terminated. The patient tolerated the procedure without immediate complications.  IMPRESSION:  1) Normal esophagus and GEJ. 2) Moderate diffuse gastritis. 3) Small hiatal hernia. 4) Fleshy antral fold with antral gastritis. 5) Mild duodenitis in the bulb.  RECOMMENDATIONS      1.  Await pathology results 2.  Avoid NSAIDS for two weeks 3.  Anti-reflux regimen to be  follow 4.  Continue PPI 's.  REPEAT EXAM:  No recall planned for now.  DISCHARGE INSTRUCTIONS: standard discharge _______________________________ eSigned:  Dr. Lorenza Burton, MD Nov 14, 2012 [id number] Revised: 2012-11-14 18:10:25.943  CPT CODES:     [phone], EGD with biopsy  DIAGNOSIS CODES: ,      792.1 Heme Positive Stool 530.81 GERD, Hepatitis C   CC: THP  PATIENT NAME:  Thomas, Mcneil MR#: [id number]

## 2012-10-25 NOTE — Care Management Note (Signed)
    Page 1 of 1   10/26/2012     12:19:46 PM   CARE MANAGEMENT NOTE 10/26/2012  Patient:  Thomas Mcneil   Account Number:  [id number]  Date Initiated:  10/25/2012  Documentation initiated by:  Letha Cape  Subjective/Objective Assessment:   dx hepatitis, melantic stool  admit-lives alone, pta indep.     Action/Plan:   Anticipated DC Date:  10/26/2012   Anticipated DC Plan:  HOME/SELF CARE      DC Planning Services  CM consult      Choice offered to / List presented to:             Status of service:  Completed, signed off Medicare Important Message given?   (If response is "NO", the following Medicare IM given date fields will be blank) Date Medicare IM given:   Date Additional Medicare IM given:    Discharge Disposition:  HOME/SELF CARE  Per UR Regulation:  Reviewed for med. necessity/level of care/duration of stay  If discussed at Long Length of Stay Meetings, dates discussed:    Comments:  10/26/12 12:18 Letha Cape RN, BSN [phone] patient is for dc today, no needs anticipated.  10/25/12 12:00 Letha Cape RN, BSN [phone] patient lives alone, pta indep.  Patient had medication coverage and transportation.  No needs anticipated.

## 2012-10-25 NOTE — Progress Notes (Signed)
[facility] Progress Note    Shanon Becvar ZOX:[id number] DOB: 1950-07-18 DOA: 10/23/2012 PCP: Melvenia Beam, MD  HPI/Subjective: For EGD today.  Brief narrative: 62 y.o. male w/ medical history of Depression; GERD; Peripheral neuropathy; Arthritis; and Hepatitis who presented with c/o melena.  2 days prior to presentation (10/21/2012) the patient had a vocal chord polyp removed (path was benign). He had been feeling weak and unwell adn reported some epigastric pain. He denied nausea or vomiting. Had never had any GI bleeding in the past. He hx of Hep C but had not required any treatment - his LFT's, albumin and INR wnl.  He had been taking naproxen twice a day for 6-7 years for chronic arthritis and neuropathy pain, along with chronic PPI.  Denies any lightheadedness, no SOB, no Chest pain. No bright red blood per rectum no history of hematemesis.  He stated he had a colonoscopy done 1 year ago but he is not sure of the results or the performing MD.  Assessment/Plan:  Melena w/ epigastric pain  Despite melena, pt is NOT anemic - he is HD stable - doubt ENT procedure linked as pt has had not hemoptysis - suspect NSAID related ulcer or gastritis or even esoph disease - GI to pursue EGD in AM - agree protonix gtt is not indicated  Mild acute renal failure Most c/w pre-renal etiolgy - hydrate and follow trend - noted baseline crt in July 2013 was elevated at 1.6  Hepatitis C Liver biopsy 2010 c/w minimal activity, Grade I, no increased fibrosis, stage 0, 1+ siderosis  Tobacco abuse Counseled on need for abstinence  Prior hx of EtOH abuse Not at risk for DTs as last drink was >1 year ago  Code Status: FULL Family Communication: no family present at time of exam Disposition Plan: stable for transfer to medical bed  Consultants: GI - [place]  Procedures: none  Antibiotics: none  DVT prophylaxis: SCDs     Objective: Blood pressure 143/88, pulse 76, temperature 97.4 F  (36.3 C), temperature source Oral, resp. rate 18, height 6' (1.829 m), weight 90.5 kg (199 lb 8.3 oz), SpO2 95.00%.  Intake/Output Summary (Last 24 hours) at 10/25/12 1317 Last data filed at 10/24/12 2150  Gross per 24 hour  Intake    203 ml  Output      0 ml  Net    203 ml   Exam: General: No acute respiratory distress Lungs: Clear to auscultation bilaterally without wheezes or crackles Cardiovascular: Regular rate and rhythm without murmur gallop or rub normal S1 and S2 Abdomen: Nontender, nondistended, soft, bowel sounds positive, no rebound, no ascites, no appreciable mass Extremities: No significant cyanosis, clubbing, or edema bilateral lower extremities  Data Reviewed: Basic Metabolic Panel:  Recent Labs Lab 10/23/12 1941 10/24/12 0550 10/25/12 0500  NA 142 144 140  K 4.0 3.9 3.5  CL 107 110 107  CO2 26 23 25   GLUCOSE 121* 97 91  BUN 27* 21 15  CREATININE 1.50* 1.45* 1.50*  CALCIUM 10.0 9.4 9.3  MG  --  1.9  --   PHOS  --  3.3  --    Liver Function Tests:  Recent Labs Lab 10/23/12 1941 10/24/12 0550  AST 20 16  ALT 18 13  ALKPHOS 93 82  BILITOT 0.8 1.0  PROT 7.5 6.7  ALBUMIN 4.0 3.4*   CBC:  Recent Labs Lab 10/23/12 1941 10/24/12 0125 10/24/12 0550 10/25/12 0500  WBC 9.1 9.1 8.7 6.8  HGB 14.8 13.8  13.4 13.4  HCT 42.5 38.6* 37.3* 38.8*  MCV 86.7 85.6 85.9 86.8  PLT 197 168 172 164    Recent Results (from the past 240 hour(s))  MRSA PCR SCREENING     Status: None   Collection Time    10/23/12 11:33 PM      Result Value Range Status   MRSA by PCR NEGATIVE  NEGATIVE Final   Comment:            The GeneXpert MRSA Assay (FDA     approved for NASAL specimens     only), is one component of a     comprehensive MRSA colonization     surveillance program. It is not     intended to diagnose MRSA     infection nor to guide or     monitor treatment for     MRSA infections.     Studies:  Recent x-ray studies have been reviewed in detail by  the Attending Physician  Scheduled Meds:  Scheduled Meds: . antiseptic oral rinse  15 mL Mouth Rinse q12n4p  . chlorhexidine  15 mL Mouth Rinse BID  . docusate sodium  100 mg Oral BID  . escitalopram  20 mg Oral Daily  . fluticasone  2 spray Each Nare Daily  . gabapentin  600 mg Oral TID  . nicotine  21 mg Transdermal Daily  . pantoprazole (PROTONIX) IV  40 mg Intravenous Q12H  . simvastatin  10 mg Oral q1800  . sodium chloride  3 mL Intravenous Q12H  . traZODone  200 mg Oral QHS     Time spent on care of this patient:   [facility]  [facility] Office  [phone] Pager - Text Page per Loretha Stapler as per below:  On-Call/Text Page:      [email or web address]      password [email or web address]  If 7PM-7AM, please contact night-coverage [email or web address] Password [email or web address] 10/25/2012, 1:17 PM   LOS: 2 days

## 2012-10-26 ENCOUNTER — Encounter (HOSPITAL_COMMUNITY): Payer: Self-pay | Admitting: Gastroenterology

## 2012-10-26 LAB — BASIC METABOLIC PANEL
BUN: 16 mg/dL (ref 6–23)
Creatinine, Ser: 1.56 mg/dL — ABNORMAL HIGH (ref 0.50–1.35)
GFR calc Af Amer: 53 mL/min — ABNORMAL LOW (ref >90)
GFR calc non Af Amer: 46 mL/min — ABNORMAL LOW (ref >90)
Potassium: 4 mEq/L (ref 3.5–5.1)

## 2012-10-26 LAB — CBC
Hemoglobin: 12.9 g/dL — ABNORMAL LOW (ref 13.0–17.0)
MCHC: 34.9 g/dL (ref 30.0–36.0)
Platelets: 180 10*3/uL (ref 150–400)
RDW: 13.2 % (ref 11.5–15.5)

## 2012-10-26 NOTE — Discharge Summary (Signed)
Physician Discharge Summary  Thomas Mcneil EAV:[id number] DOB: 1950-07-05 DOA: 10/23/2012  PCP: Quitman Livings, MD  Admit date: 10/23/2012 Discharge date: 10/26/2012  Time spent: 40 minutes  Recommendations for Outpatient Follow-up:  1. Followup with primary care physician.  Discharge Diagnoses:  Principal Problem:   Melena Active Problems:   HEPATITIS C   TOBACCO ABUSE   Discharge Condition: Stable  Diet recommendation: Regular diet  Filed Weights   10/23/12 2345 10/25/12 1650  Weight: 90.5 kg (199 lb 8.3 oz) 90.719 kg (200 lb)    History of present illness:  Thomas Mcneil is a 62 y.o. male  has a past medical history of Depression; GERD (gastroesophageal reflux disease); Complication of anesthesia; Peripheral neuropathy; Arthritis; and Hepatitis.  Presented with  2 days ago patient had vocal chord polyps removal. After the procedure he started to have melena. He have been feeling weak and unwell reports some epigastric pain as well. He denies any nausea or vomiting. Have never had any GI bleeding in the past. He has hx of Hep C but have not required any treatment his LFT's, albumin and INR wnl.  He has been taking naproxen twice a day for 6-7 years for chronic arthritis and neuropathy pain, also has been on chronic PPI as well.  Denies any lightheadedness, no SOB, no Chest pain. No bright red blood per rectum no history of hematemesis.  He states that he had a colonoscopy done 1 year ago but he is not sure who did that.   Hospital Course:  1. Melena: Despite the melena patient is not anemic, please note that patient has recent ENT procedure with focal cord polyp resection. Patient probably had some minor bleeding after that and his will the blood which resulted in the melena. Gastroenterology was called because patient has history of GERD and NSAID use, EGD was done and showed moderate diffuse gastritis but no evidence of bleeding. GI recommended to continue PPIs, avoid acidic food and  followup as needed as outpatient. For blood counts see CBC at the end of this report.  2. Mild renal sufficiency: The patient presented with creatinine of 1.45, he has normal BUN. Hydration with IV fluids did not change the creatinine very much, on the day of discharge is 1.56, this is suggesting chronicity of his renal insufficiency. Patient will follow as his primary care physician.  3. Hepatitis C: Liver biopsy in 2010 consistent with minimal activity, grade 1 with no increased fibrosis stage 0 cirrhosis.  4. Tobacco abuse: Counseled.  Procedures:  EGD done by Dr. Loreta Ave showed moderate diffuse gastritis, small hiatal hernia with flexion to fold with antral gastritis. Patient has mild duodenitis apparent on the duodenal bulb  Consultations:  Dr. Loreta Ave of gastroenterology service.  Discharge Exam: Filed Vitals:   10/25/12 1830 10/25/12 1858 10/25/12 2042 10/26/12 0500  BP: 131/77 167/88 177/98 124/78  Pulse:  68 78 87  Temp:  97.7 F (36.5 C) 99 F (37.2 C) 98.2 F (36.8 C)  TempSrc:  Oral Oral Oral  Resp: 16 18 19 20   Height:      Weight:      SpO2: 96% 99% 100% 97%   General: Alert and awake, oriented x3, not in any acute distress. HEENT: anicteric sclera, pupils reactive to light and accommodation, EOMI CVS: S1-S2 clear, no murmur rubs or gallops Chest: clear to auscultation bilaterally, no wheezing, rales or rhonchi Abdomen: soft nontender, nondistended, normal bowel sounds, no organomegaly Extremities: no cyanosis, clubbing or edema noted bilaterally Neuro: Cranial nerves  II-XII intact, no focal neurological deficits  Discharge Instructions  Discharge Orders   Future Orders Complete By Expires     Increase activity slowly  As directed         Medication List    STOP taking these medications       naproxen 500 MG tablet  Commonly known as:  NAPROSYN      TAKE these medications       escitalopram 20 MG tablet  Commonly known as:  LEXAPRO  Take 20 mg by  mouth daily.     fluticasone 50 MCG/ACT nasal spray  Commonly known as:  FLONASE  Place 2 sprays into the nose daily.     gabapentin 600 MG tablet  Commonly known as:  NEURONTIN  Take 600 mg by mouth 3 (three) times daily.     hydrOXYzine 25 MG tablet  Commonly known as:  ATARAX/VISTARIL  Take 25 mg by mouth daily as needed for itching (for allergies). For allergies     lovastatin 20 MG tablet  Commonly known as:  MEVACOR  Take 20 mg by mouth at bedtime.     multivitamin with minerals Tabs  Take 1 tablet by mouth daily.     omeprazole 40 MG capsule  Commonly known as:  PRILOSEC  Take 40 mg by mouth daily.     traZODone 100 MG tablet  Commonly known as:  DESYREL  Take 200 mg by mouth at bedtime.     vitamin B-12 1000 MCG tablet  Commonly known as:  CYANOCOBALAMIN  Take 1,000 mcg by mouth daily.       No Known Allergies    The results of significant diagnostics from this hospitalization (including imaging, microbiology, ancillary and laboratory) are listed below for reference.    Significant Diagnostic Studies: No results found.  Microbiology: Recent Results (from the past 240 hour(s))  MRSA PCR SCREENING     Status: None   Collection Time    10/23/12 11:33 PM      Result Value Range Status   MRSA by PCR NEGATIVE  NEGATIVE Final   Comment:            The GeneXpert MRSA Assay (FDA     approved for NASAL specimens     only), is one component of a     comprehensive MRSA colonization     surveillance program. It is not     intended to diagnose MRSA     infection nor to guide or     monitor treatment for     MRSA infections.     Labs: Basic Metabolic Panel:  Recent Labs Lab 10/23/12 1941 10/24/12 0550 10/25/12 0500 10/26/12 0430  NA 142 144 140 141  K 4.0 3.9 3.5 4.0  CL 107 110 107 108  CO2 26 23 25 23   GLUCOSE 121* 97 91 96  BUN 27* 21 15 16   CREATININE 1.50* 1.45* 1.50* 1.56*  CALCIUM 10.0 9.4 9.3 9.1  MG  --  1.9  --   --   PHOS  --  3.3   --   --    Liver Function Tests:  Recent Labs Lab 10/23/12 1941 10/24/12 0550  AST 20 16  ALT 18 13  ALKPHOS 93 82  BILITOT 0.8 1.0  PROT 7.5 6.7  ALBUMIN 4.0 3.4*   No results found for this basename: LIPASE, AMYLASE,  in the last 168 hours No results found for this basename: AMMONIA,  in the last 168  hours CBC:  Recent Labs Lab 10/23/12 1941 10/24/12 0125 10/24/12 0550 10/25/12 0500 10/26/12 0430  WBC 9.1 9.1 8.7 6.8 6.8  HGB 14.8 13.8 13.4 13.4 12.9*  HCT 42.5 38.6* 37.3* 38.8* 37.0*  MCV 86.7 85.6 85.9 86.8 86.2  PLT 197 168 172 164 180   Cardiac Enzymes: No results found for this basename: CKTOTAL, CKMB, CKMBINDEX, TROPONINI,  in the last 168 hours BNP: BNP (last 3 results) No results found for this basename: PROBNP,  in the last 8760 hours CBG: No results found for this basename: GLUCAP,  in the last 168 hours     Signed:  Seydina Holliman A  [facility] 10/26/2012, 11:12 AM

## 2012-10-26 NOTE — Progress Notes (Signed)
Thomas Mcneil to be D/C'd Home per MD order.  Discussed with the patient and all questions fully answered.    Medication List    STOP taking these medications       naproxen 500 MG tablet  Commonly known as:  NAPROSYN      TAKE these medications       escitalopram 20 MG tablet  Commonly known as:  LEXAPRO  Take 20 mg by mouth daily.     fluticasone 50 MCG/ACT nasal spray  Commonly known as:  FLONASE  Place 2 sprays into the nose daily.     gabapentin 600 MG tablet  Commonly known as:  NEURONTIN  Take 600 mg by mouth 3 (three) times daily.     hydrOXYzine 25 MG tablet  Commonly known as:  ATARAX/VISTARIL  Take 25 mg by mouth daily as needed for itching (for allergies). For allergies     lovastatin 20 MG tablet  Commonly known as:  MEVACOR  Take 20 mg by mouth at bedtime.     multivitamin with minerals Tabs  Take 1 tablet by mouth daily.     omeprazole 40 MG capsule  Commonly known as:  PRILOSEC  Take 40 mg by mouth daily.     traZODone 100 MG tablet  Commonly known as:  DESYREL  Take 200 mg by mouth at bedtime.     vitamin B-12 1000 MCG tablet  Commonly known as:  CYANOCOBALAMIN  Take 1,000 mcg by mouth daily.        VVS, Skin clean, dry and intact without evidence of skin break down, no evidence of skin tears noted. IV catheter discontinued intact. Site without signs and symptoms of complications. Dressing and pressure applied.  An After Visit Summary was printed and given to the patient. Follow up appointments , new prescriptions and medication administration times given. Printouts on gastritis and GI bleed given and pt teachback Patient escorted via WC, and D/C home via private auto.  Cindra Eves, RN 10/26/2012 4:15 PM

## 2012-11-17 ENCOUNTER — Ambulatory Visit: Payer: Medicare Other | Attending: Otolaryngology

## 2012-11-17 DIAGNOSIS — R490 Dysphonia: Secondary | ICD-10-CM | POA: Insufficient documentation

## 2012-11-17 DIAGNOSIS — IMO0001 Reserved for inherently not codable concepts without codable children: Secondary | ICD-10-CM | POA: Insufficient documentation

## 2012-11-24 ENCOUNTER — Ambulatory Visit: Payer: Medicare Other | Admitting: Speech Pathology

## 2012-12-06 ENCOUNTER — Ambulatory Visit: Payer: Medicare Other

## 2012-12-13 ENCOUNTER — Ambulatory Visit: Payer: Medicare Other | Attending: Otolaryngology | Admitting: Speech Pathology

## 2012-12-13 DIAGNOSIS — IMO0001 Reserved for inherently not codable concepts without codable children: Secondary | ICD-10-CM | POA: Insufficient documentation

## 2012-12-13 DIAGNOSIS — R490 Dysphonia: Secondary | ICD-10-CM | POA: Insufficient documentation

## 2012-12-20 ENCOUNTER — Ambulatory Visit: Payer: Medicare Other

## 2013-03-17 ENCOUNTER — Encounter: Payer: Self-pay | Admitting: Neurology

## 2013-03-17 ENCOUNTER — Ambulatory Visit (INDEPENDENT_AMBULATORY_CARE_PROVIDER_SITE_OTHER): Payer: 59 | Admitting: Neurology

## 2013-03-17 VITALS — BP 176/97 | HR 66 | Temp 98.5°F | Ht 73.0 in | Wt 232.0 lb

## 2013-03-17 DIAGNOSIS — R259 Unspecified abnormal involuntary movements: Secondary | ICD-10-CM

## 2013-03-17 DIAGNOSIS — R251 Tremor, unspecified: Secondary | ICD-10-CM

## 2013-03-17 DIAGNOSIS — G5790 Unspecified mononeuropathy of unspecified lower limb: Secondary | ICD-10-CM

## 2013-03-17 NOTE — Progress Notes (Signed)
Subjective:    Patient ID: Thomas Mcneil is a 62 y.o. male.  HPI  Huston Foley, MD, PhD [facility] [address], [address] [address] [place], [place] [address]  Dear [name],   I saw your patient, [name], upon your kind request in my neurologic clinic today for initial consultation of his tremors. The patient is unaccompanied today. As you know, [name] is a very pleasant 62 year old right-handed gentleman with an underlying medical history of reflux disease, hepatitis C, hypertension, smoking, prior alcohol abuse and neuropathy (deemed secondary to alcohol abuse), depression and insomnia, who has been experiencing a bilateral upper extremity tremor for the past year, worse in the last 6 months. He quit smoking a few months ago on 10/31/12 and he quit alcohol 18 months. He had no recent medication changes and he has never been treated with a neuroleptic medication nor lithium or VPA. He has been on Lexapro for the past few years. He notices primarily a postural and action tremor, which impairs his handwriting and his fine motor skills. He was started on propranolol on 02/02/13, which has helped the tremor a little bit.  He had blood work through your office on 12/23/2012 which I reviewed. He had a normal CBC, CMP was normal with the exception of a creatinine of 1.52, LDL was 99, triglycerides were 113, HDL was 37, TSH was normal, free T4 was normal, PSA was normal, vitamin D was on the low end of the spectrum at 39. There is no FHx of PD, or ET.  His Past Medical History Is Significant For: Past Medical History  Diagnosis Date  . Depression   . GERD (gastroesophageal reflux disease)   . Complication of anesthesia     slow to wake up after last surgery  . Peripheral neuropathy     feet and hands  . Arthritis   . Hepatitis     hep c    His Past Surgical History Is Significant For: Past Surgical History  Procedure Laterality Date  . Tonsillectomy    .  Direct laryngoscopy  12/26/2011    Procedure: DIRECT LARYNGOSCOPY;  Surgeon: Melvenia Beam, MD;  Location: [facility] OR;  Service: ENT;  Laterality: N/A;  Directy Laryngoscopy with biopsy [phone]  . Direct laryngoscopy N/A 10/21/2012    Procedure: DIRECT LARYNGOSCOPY;  Surgeon: Melvenia Beam, MD;  Location: [facility] OR;  Service: ENT;  Laterality: N/A;  . Esophagogastroduodenoscopy N/A 10/25/2012    Procedure: ESOPHAGOGASTRODUODENOSCOPY (EGD);  Surgeon: Charna Elizabeth, MD;  Location: [facility] ENDOSCOPY;  Service: Endoscopy;  Laterality: N/A;    His Family History Is Significant For: Family History  Problem Relation Age of Onset  . Hypertension Mother     His Social History Is Significant For: History   Social History  . Marital Status: Widowed    Spouse Name: N/A    Number of Children: N/A  . Years of Education: N/A   Social History Main Topics  . Smoking status: Current Every Day Smoker -- 0.25 packs/day for 45 years    Types: Cigarettes  . Smokeless tobacco: None  . Alcohol Use: No     Comment: Quit EtOH 1 year ago, used to be a heavy drinker  . Drug Use: Yes     Comment: "Crack" none in 1 year  . Sexual Activity: None   Other Topics Concern  . None   Social History Narrative  . None    His Allergies Are:  No Known Allergies:   His  Current Medications Are:  Outpatient Encounter Prescriptions as of 03/17/2013  Medication Sig Dispense Refill  . escitalopram (LEXAPRO) 20 MG tablet Take 20 mg by mouth daily.      . fluticasone (FLONASE) 50 MCG/ACT nasal spray Place 2 sprays into the nose daily.      Marland Kitchen gabapentin (NEURONTIN) 600 MG tablet Take 600 mg by mouth 3 (three) times daily.      . hydrOXYzine (ATARAX/VISTARIL) 25 MG tablet Take 25 mg by mouth daily as needed for itching (for allergies). For allergies      . Multiple Vitamin (MULTIVITAMIN WITH MINERALS) TABS Take 1 tablet by mouth daily.      . naproxen (NAPROSYN) 500 MG tablet Take 500 mg by mouth 2 (two) times daily with a meal.      .  omeprazole (PRILOSEC) 40 MG capsule Take 40 mg by mouth daily.      . traZODone (DESYREL) 100 MG tablet Take 200 mg by mouth at bedtime.      . lovastatin (MEVACOR) 20 MG tablet Take 20 mg by mouth at bedtime.      . propranolol (INDERAL) 40 MG tablet Take 1 tablet by mouth 2 (two) times daily.      . vitamin B-12 (CYANOCOBALAMIN) 1000 MCG tablet Take 1,000 mcg by mouth daily.       No facility-administered encounter medications on file as of 03/17/2013.   Review of Systems:  Out of a complete 14 point review of systems, all are reviewed and negative with the exception of these symptoms as listed below:  Review of Systems  Constitutional: Positive for activity change and unexpected weight change (gain).  Respiratory: Positive for cough and wheezing.   Cardiovascular: Positive for leg swelling.  Genitourinary:       Impotence   Musculoskeletal: Positive for arthralgias and myalgias.  Neurological: Positive for tremors and numbness.  Psychiatric/Behavioral: Positive for sleep disturbance.    Objective:  Neurologic Exam  Physical Exam Physical Examination:   Filed Vitals:   03/17/13 1402  BP: 176/97  Pulse: 66  Temp: 98.5 F (36.9 C)    General Examination: The patient is a very pleasant 62 y.o. male in no acute distress. He appears well-developed and well-nourished and adequately groomed.   HEENT: Normocephalic, atraumatic, pupils are equal, round and reactive to light and accommodation. Extraocular tracking is good without limitation to gaze excursion or nystagmus noted. Normal smooth pursuit is noted. Hearing is grossly intact. Face is symmetric with normal facial animation and normal facial sensation. Speech is clear with no dysarthria noted. There is no hypophonia. There is no lip, neck/head, jaw or voice tremor. Neck is supple with full range of passive and active motion. There are no carotid bruits on auscultation. Oropharynx exam reveals: moderate mouth dryness, adequate  dental hygiene and moderate airway crowding, due to large tongue. Mallampati is class III. Tongue protrudes centrally and palate elevates symmetrically.   Chest: Clear to auscultation without wheezing, rhonchi or crackles noted.  Heart: S1+S2+0, regular and normal without murmurs, rubs or gallops noted.   Abdomen: Soft, non-tender and non-distended with normal bowel sounds appreciated on auscultation.  Extremities: There is trace pitting edema in the distal lower extremities bilaterally. Pedal pulses are intact.  Skin: Warm and dry without trophic changes noted. There are no varicose veins.  Musculoskeletal: exam reveals no obvious joint deformities, tenderness or joint swelling or erythema.   Neurologically:  Mental status: The patient is awake, alert and oriented in all 4 spheres.  His memory, attention, language and knowledge are appropriate. There is no aphasia, agnosia, apraxia or anomia. Speech is clear with normal prosody and enunciation. Thought process is linear. Mood is congruent and affect is normal.  Cranial nerves are as described above under HEENT exam. In addition, shoulder shrug is normal with equal shoulder height noted. Motor exam: Normal bulk, strength and tone is noted. There is no drift, or rebound. There is no resting tremor. There is a bilateral upper extremity postural and action tremor, which is minimal in degree. There tremor frequency is fairly fast and the amplitude is small. On Archimedes spiral drawing there is moderate tremulousness noted. Handwriting is moderately tremulousness, but legible. There is no evidence of micrographia.  Romberg is positive. Reflexes are trace in the UEs and absent in the LEs. Fine motor skills are mildly impaired in the UEs, and mild to moderately impaired in the LEs.   Cerebellar testing shows no dysmetria or intention tremor on finger to nose testing. Heel to shin is unremarkable bilaterally. There is no truncal ataxia.  Sensory exam is  intact to light touch, pinprick, vibration, temperature sense in the upper extremities, but decreased to all modalities in the lower extremities up to mid-calves.  Gait, station and balance: He stands up with mild difficulty and stands wide based. No veering to one side is noted. No leaning to one side is noted. Posture is age-appropriate. He walks wide based insecurity with turning. Tandem walk is not possible.   Assessment and Plan:   In summary, Lamount Bankson is a very pleasant 62 y.o.-year old male with a history of reflux disease, hepatitis C, hypertension, smoking, prior alcohol abuse, neuropathy, depression and insomnia, who has been experiencing a bilateral upper extremity tremor for the past year, worse in the last 6 months. His physical exam is c/w with a isolated UE tremor with a DDx of ET vs. Medication induced tremor (from the SSRI) vs. Enhanced physiological tremor. He does not have parkinsonian findings and I reassured the patient in that regard. Furthermore, he has evidence of neuropathy and sensory ataxia.  I had a long chat with the patient about my findings and his symptoms the diagnosis of tremor, the prognosis and treatment options. He is advised that there is no cure for tremors unless it is medication induced. We talked about medical treatments and non-pharmacological approaches. We talked about a healthy lifestyle in general. I encouraged the patient to eat healthy, exercise daily and keep well hydrated, to keep a scheduled bedtime and wake time routine, to not skip any meals and eat healthy snacks in between meals and to have protein with every meal. As far as symptomatic treatment, I would recommend continuation of propranolol, which you can increase gradually.   As far as further diagnostic testing is concerned, I suggested the following today: if you have not checked recently, I would advise B12 check, ammonia level, LFTs and monitor HbA1c and TSH.  I also explained common tremor  triggers: tremors may be exacerbated by anxiety, anger, nervousness, excitement, dehydration, sleep deprivation, by caffeine, and low blood sugar values or blood sugar fluctuations. Some medications, especially some antidepressants and lithium can cause or exacerbate tremors.  I answered all his questions today and the patient was in agreement with the above outlined plan. I can see him back on an as needed basis.   Thank you very much for allowing me to participate in the care of this nice patient. If I can be of any further  assistance to you please do not hesitate to call me at [phone].  Sincerely,   Star Age, MD, PhD

## 2013-03-17 NOTE — Patient Instructions (Addendum)
  Please remember, that any kind of tremor may be exacerbated by anxiety, anger, nervousness, excitement, dehydration, sleep deprivation, by caffeine, and low blood sugar values or blood sugar fluctuations. Some medications, especially some antidepressants and lithium can cause or exacerbate tremors. Tremors may temporarily calm down her subside with the use of a benzodiazepine such as Valium or related medications and with alcohol. Be aware however that drinking alcohol is not an approved treatment or appropriate treatment for tremor control and long-term use of benzodiazepines such as Valium, lorazepam, alprazolam, or clonazepam can cause habit formation, physical and psychological addiction.  I recommend, that you talk to your PCP about potentially increasing your new medication, propranolol gradually. There is room to increase. I do not need to follow you on a regular basis and will give my recommendation to your referring provider.

## 2013-03-18 ENCOUNTER — Other Ambulatory Visit (HOSPITAL_COMMUNITY): Payer: Self-pay | Admitting: Nurse Practitioner

## 2013-03-18 ENCOUNTER — Ambulatory Visit (HOSPITAL_COMMUNITY)
Admission: RE | Admit: 2013-03-18 | Discharge: 2013-03-18 | Disposition: A | Discharge status: Home / Self Care | Payer: Medicare HMO | Source: Ambulatory Visit | Attending: Nurse Practitioner | Admitting: Nurse Practitioner

## 2013-03-18 DIAGNOSIS — R05 Cough: Secondary | ICD-10-CM

## 2013-03-18 DIAGNOSIS — R0602 Shortness of breath: Secondary | ICD-10-CM | POA: Insufficient documentation

## 2013-03-18 DIAGNOSIS — R059 Cough, unspecified: Secondary | ICD-10-CM | POA: Insufficient documentation

## 2013-04-14 ENCOUNTER — Other Ambulatory Visit: Payer: Self-pay

## 2013-04-18 ENCOUNTER — Ambulatory Visit (HOSPITAL_COMMUNITY)
Admission: RE | Admit: 2013-04-18 | Discharge: 2013-04-18 | Disposition: A | Discharge status: Home / Self Care | Payer: Medicare HMO | Source: Ambulatory Visit | Attending: Nurse Practitioner | Admitting: Nurse Practitioner

## 2013-04-18 ENCOUNTER — Other Ambulatory Visit (HOSPITAL_COMMUNITY): Payer: Self-pay | Admitting: Nurse Practitioner

## 2013-04-18 DIAGNOSIS — R059 Cough, unspecified: Secondary | ICD-10-CM | POA: Insufficient documentation

## 2013-04-18 DIAGNOSIS — R05 Cough: Secondary | ICD-10-CM

## 2013-05-19 ENCOUNTER — Ambulatory Visit: Payer: Self-pay | Admitting: Internal Medicine

## 2014-01-04 ENCOUNTER — Ambulatory Visit (INDEPENDENT_AMBULATORY_CARE_PROVIDER_SITE_OTHER): Payer: Medicare HMO | Admitting: Internal Medicine

## 2014-01-04 ENCOUNTER — Encounter: Payer: Self-pay | Admitting: Internal Medicine

## 2014-01-04 VITALS — BP 126/82 | HR 58 | Temp 97.8°F | Resp 10 | Ht 73.5 in | Wt 202.0 lb

## 2014-01-04 DIAGNOSIS — B1710 Acute hepatitis C without hepatic coma: Secondary | ICD-10-CM

## 2014-01-04 DIAGNOSIS — F329 Major depressive disorder, single episode, unspecified: Secondary | ICD-10-CM

## 2014-01-04 DIAGNOSIS — G252 Other specified forms of tremor: Secondary | ICD-10-CM

## 2014-01-04 DIAGNOSIS — K219 Gastro-esophageal reflux disease without esophagitis: Secondary | ICD-10-CM

## 2014-01-04 DIAGNOSIS — G5790 Unspecified mononeuropathy of unspecified lower limb: Secondary | ICD-10-CM

## 2014-01-04 DIAGNOSIS — E785 Hyperlipidemia, unspecified: Secondary | ICD-10-CM

## 2014-01-04 DIAGNOSIS — F3289 Other specified depressive episodes: Secondary | ICD-10-CM

## 2014-01-04 DIAGNOSIS — F1021 Alcohol dependence, in remission: Secondary | ICD-10-CM

## 2014-01-04 DIAGNOSIS — F17200 Nicotine dependence, unspecified, uncomplicated: Secondary | ICD-10-CM

## 2014-01-04 DIAGNOSIS — G250 Essential tremor: Secondary | ICD-10-CM | POA: Insufficient documentation

## 2014-01-04 DIAGNOSIS — E538 Deficiency of other specified B group vitamins: Secondary | ICD-10-CM

## 2014-01-04 MED ORDER — PRIMIDONE 50 MG PO TABS: 50.0000 mg | ORAL_TABLET | Freq: Every day | ORAL | Status: DC

## 2014-01-04 NOTE — Progress Notes (Signed)
Patient ID: Thomas Mcneil, male   DOB: May 10, 1951, 63 y.o.   MRN: [id number]    Chief Complaint  Patient presents with  . Establish Care    New Patient Establish care   . Tremors    x 1 year patient with tremors    No Known Allergies  HPI 63 y/o male pt is here to establish care He has history of chronic hepatitis c, GERD, peripheral neuropathy, alcohol abuse (stopped over 2 years back), tobacco use (stopped a year back) and depression He has been on propranolol for almost a year for his tremor and this has not helped him- on 120 mg bid He has lost desire to do things he liked. He feels tired easily and has lost pleasure in things. He feels he is just doing things to get done He is frustrated with the tremors as this is affecting his daily activities like tying shoelaces, buttoning shirts, holding pen/ glasses/ mugs, writing has been limited. He sees a psychiatrist but is lost to follow up. He does not want any of his psych meds changes at present  Review of Systems  Constitutional: Negative for fever, chills, weight loss, diaphoresis.  HENT: Negative for congestion, hearing loss and sore throat.   Eyes: Negative for blurred vision, double vision and discharge. wears glasses Respiratory: Negative for cough, sputum production, shortness of breath and wheezing.   Cardiovascular: Negative for chest pain, palpitations, orthopnea and leg swelling.  Gastrointestinal: Negative for heartburn, nausea, vomiting, abdominal pain, diarrhea, melena, rectal bleed and constipation.  Genitourinary: Negative for dysuria, urgency, frequency and flank pain.  Musculoskeletal: Negative for back pain, falls. Has pain in his knee joint occasionally Skin: Negative for itching and rash.  Neurological: Negative for dizziness, tingling, focal weakness and headaches. tends to lose balance at times Psychiatric/Behavioral: Negative for memory loss. The patient is not nervous/anxious.  negative for insomnia  Wt  Readings from Last 3 Encounters:  01/04/14 202 lb (91.627 kg)  03/17/13 232 lb (105.235 kg)  10/25/12 200 lb (90.719 kg)   Past Medical History  Diagnosis Date  . Depression   . GERD (gastroesophageal reflux disease)   . Complication of anesthesia     slow to wake up after last surgery  . Peripheral neuropathy     feet and hands  . Arthritis   . Hepatitis     hep c  . High cholesterol   . High blood pressure    Past Surgical History  Procedure Laterality Date  . Tonsillectomy    . Direct laryngoscopy  12/26/2011    Procedure: DIRECT LARYNGOSCOPY;  Surgeon: Melvenia Beam, MD;  Location: [facility] OR;  Service: ENT;  Laterality: N/A;  Directy Laryngoscopy with biopsy [phone]  . Direct laryngoscopy N/A 10/21/2012    Procedure: DIRECT LARYNGOSCOPY;  Surgeon: Melvenia Beam, MD;  Location: [facility] OR;  Service: ENT;  Laterality: N/A;  . Esophagogastroduodenoscopy N/A 10/25/2012    Procedure: ESOPHAGOGASTRODUODENOSCOPY (EGD);  Surgeon: Charna Elizabeth, MD;  Location: [facility] ENDOSCOPY;  Service: Endoscopy;  Laterality: N/A;   Current Outpatient Prescriptions on File Prior to Visit  Medication Sig Dispense Refill  . escitalopram (LEXAPRO) 20 MG tablet Take 20 mg by mouth daily.      . fluticasone (FLONASE) 50 MCG/ACT nasal spray Place 2 sprays into the nose daily.      . hydrOXYzine (ATARAX/VISTARIL) 25 MG tablet Take 25 mg by mouth daily as needed for itching (for allergies). For allergies      . lovastatin (MEVACOR) 20  MG tablet Take 20 mg by mouth at bedtime. For High Cholesterol      . Multiple Vitamin (MULTIVITAMIN WITH MINERALS) TABS Take 1 tablet by mouth daily.      Marland Kitchen omeprazole (PRILOSEC) 40 MG capsule Take 40 mg by mouth daily.      . traZODone (DESYREL) 100 MG tablet Take 200 mg by mouth at bedtime.      . vitamin B-12 (CYANOCOBALAMIN) 1000 MCG tablet Take 1,000 mcg by mouth daily.       No current facility-administered medications on file prior to visit.   Family History  Problem Relation Age of  Onset  . Hypertension Mother   . Diverticulosis Mother   . GER disease Mother   . Lung disease Sister   . Polymyositis Sister    History   Social History  . Marital Status: Widowed    Spouse Name: N/A    Number of Children: N/A  . Years of Education: N/A   Occupational History  . Not on file.   Social History Main Topics  . Smoking status: Former Smoker -- 0.25 packs/day for 45 years    Types: Cigarettes    Start date: 06/09/2012  . Smokeless tobacco: Not on file  . Alcohol Use: No     Comment: Quit EtOH 1 year ago, used to be a heavy drinker  . Drug Use: Yes     Comment: "Crack" none in 1 year  . Sexual Activity: Not on file   Other Topics Concern  . Not on file   Social History Narrative  . No narrative on file   Physical exam BP 126/82  Pulse 58  Temp(Src) 97.8 F (36.6 C) (Oral)  Resp 10  Ht 6' 1.5" (1.867 m)  Wt 202 lb (91.627 kg)  BMI 26.29 kg/m2  SpO2 95%  General- elderly male in no acute distress Head- atraumatic, normocephalic Eyes- PERRLA, EOMI, no pallor, no icterus, no discharge Neck- no lymphadenopathy, no thyromegaly, no jugular vein distension, no carotid bruit Nose- normal nasal mucosa, no maxillary sinus tenderness, no frontal sinus tenderness Mouth- normal mucus membrane, no oral thrush, normal oropharynx Chest- no chest wall deformities, no chest wall tenderness Cardiovascular- normal s1,s2, no murmurs/ rubs/ gallops, normal distal pulses Respiratory- bilateral clear to auscultation, no wheeze, no rhonchi, no crackles Abdomen- bowel sounds present, soft, non tender, no organomegaly, no guarding or rigidity, no CVA tenderness Musculoskeletal- able to move all 4 extremities, no spinal and paraspinal tenderness, wide based gait, normal range of motion, no leg edema Neurological- no focal deficit,upper extremity tremors with activities, no resting tremors Skin- warm and dry, tattoo in both arms Psychiatry- alert and oriented to person, place  and time, normal mood and affect  Labs- CBC Latest Ref Rng 10/26/2012 10/25/2012 10/24/2012  WBC 4.0 - 10.5 K/uL 6.8 6.8 8.7  Hemoglobin 13.0 - 17.0 g/dL 12.9(L) 13.4 13.4  Hematocrit 39.0 - 52.0 % 37.0(L) 38.8(L) 37.3(L)  Platelets 150 - 400 K/uL 180 164 172   CMP     Component Value Date/Time   NA 141 10/26/2012 0430   K 4.0 10/26/2012 0430   CL 108 10/26/2012 0430   CO2 23 10/26/2012 0430   GLUCOSE 96 10/26/2012 0430   BUN 16 10/26/2012 0430   CREATININE 1.56* 10/26/2012 0430   CALCIUM 9.1 10/26/2012 0430   PROT 6.7 10/24/2012 0550   ALBUMIN 3.4* 10/24/2012 0550   AST 16 10/24/2012 0550   ALT 13 10/24/2012 0550   ALKPHOS 82 10/24/2012  0550   BILITOT 1.0 10/24/2012 0550   GFRNONAA 46* 10/26/2012 0430   GFRAA 53* 10/26/2012 0430   Lipid Panel     Component Value Date/Time   CHOL 177 08/30/2008 2201   TRIG 91 08/30/2008 2201   HDL 51 08/30/2008 2201   CHOLHDL 3.5 Ratio 08/30/2008 2201   VLDL 18 08/30/2008 2201   LDLCALC 108* 08/30/2008 2201   Lab Results  Component Value Date   TSH 1.115 10/24/2012   Assessment/plan  1. HEPATITIS C - Hep C Antibody; Future  2. Gastroesophageal reflux disease, esophagitis presence not specified Continue prilosec current regimen - CMP; Future - TSH; Future  3. PERIPHERAL NEUROPATHY, FEET Continue gabapentin current regimen  4. ALCOHOL ABUSE, HX OF Not taking alcohol at present, has neuropathy and is on gabapentin for now. Fall precautions  5. DEPRESSION Continue lexapro and abilify for now with his trazodone current regimen. Pt to set a follow up with psychiatrist  6. TOBACCO ABUSE Not smoking currently, encouraged to abstain  7. Essential tremor Check thyroid function. Will decrease propranolol to 80 mg bid for now and introduce mysoline 50 mg daily, reassess - TSH; Future  8. Other and unspecified hyperlipidemia On lovastatin, check lipid prior to next visit - Lipid Panel; Future  9. B12 deficiency Continue current b12 supplement -  CBC with Differential; Future - TSH; Future

## 2014-03-07 ENCOUNTER — Ambulatory Visit (INDEPENDENT_AMBULATORY_CARE_PROVIDER_SITE_OTHER): Payer: Medicare HMO

## 2014-03-07 DIAGNOSIS — Z23 Encounter for immunization: Secondary | ICD-10-CM

## 2014-03-29 ENCOUNTER — Other Ambulatory Visit: Payer: Medicare HMO

## 2014-03-29 ENCOUNTER — Other Ambulatory Visit: Payer: Self-pay | Admitting: *Deleted

## 2014-03-29 DIAGNOSIS — Z1329 Encounter for screening for other suspected endocrine disorder: Secondary | ICD-10-CM

## 2014-03-29 DIAGNOSIS — R609 Edema, unspecified: Secondary | ICD-10-CM

## 2014-03-29 DIAGNOSIS — E785 Hyperlipidemia, unspecified: Secondary | ICD-10-CM

## 2014-03-30 LAB — CBC WITH DIFFERENTIAL/PLATELET
BASOS ABS: 0 10*3/uL (ref 0.0–0.2)
Basos: 0 %
EOS: 3 %
Eosinophils Absolute: 0.2 10*3/uL (ref 0.0–0.4)
HEMATOCRIT: 47.3 % (ref 37.5–51.0)
Hemoglobin: 16.8 g/dL (ref 12.6–17.7)
IMMATURE GRANS (ABS): 0 10*3/uL (ref 0.0–0.1)
IMMATURE GRANULOCYTES: 0 %
LYMPHS: 49 %
Lymphocytes Absolute: 3.3 10*3/uL — ABNORMAL HIGH (ref 0.7–3.1)
MCH: 32.1 pg (ref 26.6–33.0)
MCHC: 35.5 g/dL (ref 31.5–35.7)
MCV: 90 fL (ref 79–97)
MONOCYTES: 7 %
Monocytes Absolute: 0.5 10*3/uL (ref 0.1–0.9)
NEUTROS PCT: 41 %
Neutrophils Absolute: 2.9 10*3/uL (ref 1.4–7.0)
RBC: 5.24 x10E6/uL (ref 4.14–5.80)
RDW: 14 % (ref 12.3–15.4)
WBC: 6.9 10*3/uL (ref 3.4–10.8)

## 2014-03-30 LAB — COMPREHENSIVE METABOLIC PANEL
ALK PHOS: 74 IU/L (ref 39–117)
ALT: 16 IU/L (ref 0–44)
AST: 21 IU/L (ref 0–40)
Albumin/Globulin Ratio: 1.3 (ref 1.1–2.5)
Albumin: 4.1 g/dL (ref 3.6–4.8)
BILIRUBIN TOTAL: 1.2 mg/dL (ref 0.0–1.2)
BUN / CREAT RATIO: 8 — AB (ref 10–22)
BUN: 10 mg/dL (ref 8–27)
CHLORIDE: 103 mmol/L (ref 97–108)
CO2: 23 mmol/L (ref 18–29)
Calcium: 9.8 mg/dL (ref 8.6–10.2)
Creatinine, Ser: 1.25 mg/dL (ref 0.76–1.27)
GFR calc non Af Amer: 60 mL/min/{1.73_m2} (ref >59)
GFR, EST AFRICAN AMERICAN: 70 mL/min/{1.73_m2} (ref >59)
GLUCOSE: 97 mg/dL (ref 65–99)
Globulin, Total: 3.1 g/dL (ref 1.5–4.5)
POTASSIUM: 4.2 mmol/L (ref 3.5–5.2)
Sodium: 142 mmol/L (ref 134–144)
TOTAL PROTEIN: 7.2 g/dL (ref 6.0–8.5)

## 2014-03-30 LAB — LIPID PANEL
CHOL/HDL RATIO: 3.6 ratio (ref 0.0–5.0)
Cholesterol, Total: 146 mg/dL (ref 100–199)
HDL: 41 mg/dL (ref >39)
LDL CALC: 86 mg/dL (ref 0–99)
Triglycerides: 97 mg/dL (ref 0–149)
VLDL CHOLESTEROL CAL: 19 mg/dL (ref 5–40)

## 2014-03-30 LAB — TSH: TSH: 1.28 u[IU]/mL (ref 0.450–4.500)

## 2014-03-30 LAB — HEPATITIS C ANTIBODY: Hep C Virus Ab: >11 s/co ratio — ABNORMAL HIGH (ref 0.0–0.9)

## 2014-04-05 ENCOUNTER — Encounter: Payer: Self-pay | Admitting: Internal Medicine

## 2014-04-05 ENCOUNTER — Ambulatory Visit (INDEPENDENT_AMBULATORY_CARE_PROVIDER_SITE_OTHER): Payer: Medicare HMO | Admitting: Internal Medicine

## 2014-04-05 VITALS — BP 130/82 | HR 70 | Temp 99.1°F | Resp 10 | Ht 73.5 in | Wt 190.0 lb

## 2014-04-05 DIAGNOSIS — F1010 Alcohol abuse, uncomplicated: Secondary | ICD-10-CM

## 2014-04-05 DIAGNOSIS — E785 Hyperlipidemia, unspecified: Secondary | ICD-10-CM

## 2014-04-05 DIAGNOSIS — F17200 Nicotine dependence, unspecified, uncomplicated: Secondary | ICD-10-CM

## 2014-04-05 DIAGNOSIS — G5790 Unspecified mononeuropathy of unspecified lower limb: Secondary | ICD-10-CM

## 2014-04-05 DIAGNOSIS — B351 Tinea unguium: Secondary | ICD-10-CM

## 2014-04-05 DIAGNOSIS — Z720 Tobacco use: Secondary | ICD-10-CM

## 2014-04-05 DIAGNOSIS — K219 Gastro-esophageal reflux disease without esophagitis: Secondary | ICD-10-CM

## 2014-04-05 DIAGNOSIS — Z1211 Encounter for screening for malignant neoplasm of colon: Secondary | ICD-10-CM

## 2014-04-05 DIAGNOSIS — Z136 Encounter for screening for cardiovascular disorders: Secondary | ICD-10-CM

## 2014-04-05 DIAGNOSIS — Z0000 Encounter for general adult medical examination without abnormal findings: Secondary | ICD-10-CM

## 2014-04-05 DIAGNOSIS — G250 Essential tremor: Secondary | ICD-10-CM

## 2014-04-05 DIAGNOSIS — R768 Other specified abnormal immunological findings in serum: Secondary | ICD-10-CM

## 2014-04-05 DIAGNOSIS — Z23 Encounter for immunization: Secondary | ICD-10-CM

## 2014-04-05 DIAGNOSIS — R894 Abnormal immunological findings in specimens from other organs, systems and tissues: Secondary | ICD-10-CM

## 2014-04-05 MED ORDER — TETANUS-DIPHTH-ACELL PERTUSSIS 5-2.5-18.5 LF-MCG/0.5 IM SUSP: 0.5000 mL | Freq: Once | INTRAMUSCULAR | Status: DC

## 2014-04-05 MED ORDER — FOLIC ACID 1 MG PO TABS: 1.0000 mg | ORAL_TABLET | Freq: Every day | ORAL | Status: DC

## 2014-04-05 MED ORDER — VITAMIN B-1 100 MG PO TABS: 100.0000 mg | ORAL_TABLET | Freq: Every day | ORAL | Status: DC

## 2014-04-05 NOTE — Progress Notes (Signed)
Patient ID: Thomas Mcneil, male   DOB: 07/06/1950, 63 y.o.   MRN: [id number]    Chief Complaint  Patient presents with  . Annual Exam    Yearly check-up, discuss labs (copy printed)   . Tremors    Ongoing concern   . MMSE    27/30, passed clock drawing   No Known Allergies  HPI 63 y/o male patient is here for annual exam. He has hx of reflux disease, hep c, alcohol and tobacco use, tremors, peripheral neuropathy among others. He has thickened and ingrown toe nails in left foot and would like this removed. His tremor is persistent, he is seen by neurology and is on medications without improvement. He would like to get a second opinion He has started smoking and drinking alcohol Complaint with his medications No other concerns this visit  Review of Systems  Constitutional: Negative for fever, chills, malaise/fatigue and diaphoresis.  HENT: Negative for congestion, hearing loss and sore throat.   Eyes: Negative for blurred vision, double vision and discharge. wears glasses. Has not seen an eye doctor in 3 years Respiratory: Negative for cough, sputum production, shortness of breath and wheezing. Has started smoking 2-3 cigarettes a day  Cardiovascular: Negative for chest pain, palpitations, orthopnea and leg swelling.  Gastrointestinal: Negative for heartburn, nausea, vomiting, abdominal pain, diarrhea, melena, rectal bleed and constipation.  Genitourinary: Negative for dysuria, urgency, frequency, nocturia, hematuria and flank pain.  Musculoskeletal: Negative for back pain, falls, joint pain and myalgias.  Skin: Negative for itching and rash.  Neurological: Negative for dizziness, tingling, focal weakness and headaches. has ongoing tremors Psychiatric/Behavioral: Negative for depression and memory loss. The patient is not nervous/anxious.    Past Medical History  Diagnosis Date  . Depression   . GERD (gastroesophageal reflux disease)   . Complication of anesthesia     slow to wake  up after last surgery  . Peripheral neuropathy     feet and hands  . Arthritis   . Hepatitis     hep c  . High cholesterol   . High blood pressure    Past Surgical History  Procedure Laterality Date  . Tonsillectomy    . Direct laryngoscopy  12/26/2011    Procedure: DIRECT LARYNGOSCOPY;  Surgeon: Melvenia BeamMitchell Gore, MD;  Location[facility] OR;  Service: ENT;  Laterality: N/A;  Directy Laryngoscopy with biopsy [phone]  . Direct laryngoscopy N/A 10/21/2012    Procedure: DIRECT LARYNGOSCOPY;  Surgeon: Melvenia BeamMitchell Gore, MD;  [facility] OR;  Service: ENT;  Laterality: N/A;  . Esophagogastroduodenoscopy N/A 10/25/2012    Procedure: ESOPHAGOGASTRODUODENOSCOPY (EGD);  Surgeon: Charna ElizabethJyothi Mann, MD;  Location[facility] ENDOSCOPY;  Service: Endoscopy;  Laterality: N/A;   Immunization History  Administered Date(s) Administered  . H1N1 05/30/2008  . Influenza Whole 05/30/2008, 06/25/2009, 05/09/2010  . Influenza,inj,Quad PF,36+ Mos 03/07/2014  . Td 01/26/2009   Current Outpatient Prescriptions on File Prior to Visit  Medication Sig Dispense Refill  . AMBULATORY NON FORMULARY MEDICATION Medication Name: Kre-Alkalyn 1500 mg 2 by mouth daily      . ARIPiprazole (ABILIFY) 2 MG tablet Take 2 mg by mouth daily.       . Ascorbic Acid (VITAMIN C) 1000 MG tablet Take 1,000 mg by mouth daily.      Marland Kitchen. escitalopram (LEXAPRO) 20 MG tablet Take 20 mg by mouth daily.      . fluticasone (FLONASE) 50 MCG/ACT nasal spray Place 2 sprays into the nose daily.      Marland Kitchen. gabapentin (NEURONTIN)  300 MG capsule Take 300 mg by mouth 2 (two) times daily.      . hydrOXYzine (ATARAX/VISTARIL) 25 MG tablet Take 25 mg by mouth daily as needed for itching (for allergies). For allergies      . lovastatin (MEVACOR) 20 MG tablet Take 20 mg by mouth at bedtime. For High Cholesterol      . Multiple Vitamin (MULTIVITAMIN WITH MINERALS) TABS Take 1 tablet by mouth daily.      Marland Kitchen. omeprazole (PRILOSEC) 40 MG capsule Take 40 mg by mouth daily.      . primidone  (MYSOLINE) 50 MG tablet Take 1 tablet (50 mg total) by mouth at bedtime.  30 tablet  3  . propranolol (INDERAL) 80 MG tablet Take 80 mg by mouth 2 (two) times daily.      . traZODone (DESYREL) 100 MG tablet Take 200 mg by mouth at bedtime.      . vitamin B-12 (CYANOCOBALAMIN) 1000 MCG tablet Take 1,000 mcg by mouth daily.       No current facility-administered medications on file prior to visit.   History   Social History  . Marital Status: Widowed    Spouse Name: N/A    Number of Children: N/A  . Years of Education: N/A   Occupational History  . Not on file.   Social History Main Topics  . Smoking status: Current Some Day Smoker -- 0.25 packs/day for 45 years    Types: Cigarettes    Start date: 06/09/2012  . Smokeless tobacco: Not on file  . Alcohol Use: Yes     Comment: Relaspe  . Drug Use: Yes     Comment: "Crack" none in 1 year  . Sexual Activity: Not on file   Other Topics Concern  . Not on file   Social History Narrative  . No narrative on file   Family History  Problem Relation Age of Onset  . Hypertension Mother   . Diverticulosis Mother   . GER disease Mother   . Lung disease Sister   . Polymyositis Sister    Physical exam  BP 130/82  Pulse 70  Temp(Src) 99.1 F (37.3 C) (Oral)  Resp 10  Ht 6' 1.5" (1.867 m)  Wt 190 lb (86.183 kg)  BMI 24.72 kg/m2  SpO2 95%  General- elderly male in no acute distress Head- atraumatic, normocephalic Eyes- PERRLA, EOMI, no pallor, no icterus, no discharge ENT- has cerumen in both ears, normal tympanic membrane Neck- no lymphadenopathy, no thyromegaly, no jugular vein distension, no carotid bruit Nose- normal nasal mucosa, no maxillary sinus tenderness, no frontal sinus tenderness Mouth- normal mucus membrane, no oral thrush, normal oropharynx, poor dentition Chest- no chest wall deformities, no chest wall tenderness Cardiovascular- normal s1,s2, no murmurs/ rubs/ gallops, normal distal pulses Respiratory-  bilateral clear to auscultation, no wheeze, no rhonchi, no crackles Abdomen- bowel sounds present, soft, non tender, no organomegaly, no guarding or rigidity, no CVA tenderness Musculoskeletal- able to move all 4 extremities, no spinal and paraspinal tenderness, wide based gait, normal range of motion, no leg edema Neurological- no focal deficit,upper extremity tremors with activities, no resting tremors, has decreased microfilament sensation, normal vibration sensation, normal reflexes Skin- warm and dry, tattoo in both arms Nails- loss of right great toe nail, has hypertrophied mycotic left great toe nail Psychiatry- alert and oriented to person, place and time, normal mood and affect   Labs-  CBC Latest Ref Rng 03/29/2014 10/26/2012 10/25/2012  WBC 3.4 - 10.8 x10E3/uL  6.9 6.8 6.8  Hemoglobin 12.6 - 17.7 g/dL 09.8 12.9(L) 13.4  Hematocrit 37.5 - 51.0 % 47.3 37.0(L) 38.8(L)  Platelets 150 - 400 K/uL - 180 164    CMP     Component Value Date/Time   NA 142 03/29/2014 0958   NA 141 10/26/2012 0430   K 4.2 03/29/2014 0958   CL 103 03/29/2014 0958   CO2 23 03/29/2014 0958   GLUCOSE 97 03/29/2014 0958   GLUCOSE 96 10/26/2012 0430   BUN 10 03/29/2014 0958   BUN 16 10/26/2012 0430   CREATININE 1.25 03/29/2014 0958   CALCIUM 9.8 03/29/2014 0958   PROT 7.2 03/29/2014 0958   PROT 6.7 10/24/2012 0550   ALBUMIN 3.4* 10/24/2012 0550   AST 21 03/29/2014 0958   ALT 16 03/29/2014 0958   ALKPHOS 74 03/29/2014 0958   BILITOT 1.2 03/29/2014 0958   GFRNONAA 60 03/29/2014 0958   GFRAA 70 03/29/2014 0958   Lipid Panel     Component Value Date/Time   CHOL 177 08/30/2008 2201   TRIG 97 03/29/2014 0958   HDL 41 03/29/2014 0958   HDL 51 08/30/2008 2201   CHOLHDL 3.6 03/29/2014 0958   CHOLHDL 3.5 Ratio 08/30/2008 2201   VLDL 18 08/30/2008 2201   LDLCALC 86 03/29/2014 0958   LDLCALC 108* 08/30/2008 2201   Lab Results  Component Value Date   TSH 1.280 03/29/2014   hepC virus ab  >11.0  Assessment/plan  1. Colon cancer screening Pt does not want colonoscopy - Fecal occult blood, imunochemical; Future  2. Need for prophylactic vaccination against Streptococcus pneumoniae (pneumococcus) - Pneumococcal conjugate vaccine 13-valent  3. Gastroesophageal reflux disease, esophagitis presence not specified Continue prilosec and monitor  4. Essential tremor Has been seen by neurology, has ET along with possible med induced tremor and from alcohol intake. Continue primidone and propranolol  5. 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 continue neurontin for now. Add thiamine and folic acid supplement with hx of alcoholism  6. ONYCHOMYCOSIS, TOENAILS - Ambulatory referral to Podiatry  7. TOBACCO ABUSE Screening for AAA, smoking cessation counselling provided - US Aorta; Future  8. Alcohol abuse counselling provided. Start thiamine and folic acid. Has cognitive impairment on MMSE likely from alcohol abuse and could be having some vascular component. Advised on abstainence  9. Hyperlipidemia Continue lovastatin, normal lipid panel  10. Hepatitis C antibody test positive - Ambulatory referral to Gastroenterology as pt prefers treatment. Discussed with patient about needing to abstain from etoh  11. Annual physical exam the patient was counseled regarding the appropriate use of alcohol,prevention of dental and periodontal disease, diet, regular sustained exercise for at least 30 minutes 5 times per week, the proper use of sunscreen and protective clothing, tobacco use, and recommended schedule for GI hemoccult testing, colonoscopy, cholesterol, thyroid and diabetes screening. Vaccination provided. Advance directive literature provided. FOBT card provided  12. Screening for AAA (abdominal aortic aneurysm) - US Aorta; Future

## 2014-04-05 NOTE — Progress Notes (Signed)
Passed clock drawing 

## 2014-04-06 ENCOUNTER — Telehealth: Payer: Self-pay | Admitting: Internal Medicine

## 2014-04-06 NOTE — Telephone Encounter (Signed)
You entered a Gi referral for patient for Hep C treatment, GI says they do not treat Hep C patients. Synetta Failnita said you could try referring patient to the Hepatitis Clinic but I would need a new order for that.     There is an issue with the patients insurance however, so I really can't do anything for the patient until he gets you changed to his PCP with [employer]. I have tried to contact the patient several times and the phone message states he is not taking calls at this time and to try back later.

## 2014-04-07 NOTE — Telephone Encounter (Signed)
Noted, can you send out a letter asking him to change his pcp. thanks

## 2014-04-11 ENCOUNTER — Other Ambulatory Visit: Payer: Medicare Other

## 2014-04-18 NOTE — Telephone Encounter (Signed)
Spoke with patient, patient is aware that he needs contact his insurance company. Once patient contacts insurance company he will call and let us know.

## 2014-04-24 ENCOUNTER — Ambulatory Visit: Payer: 59 | Admitting: Internal Medicine

## 2014-04-27 ENCOUNTER — Other Ambulatory Visit: Payer: Medicare Other

## 2014-05-01 ENCOUNTER — Ambulatory Visit
Admission: RE | Admit: 2014-05-01 | Discharge: 2014-05-01 | Disposition: A | Discharge status: Home / Self Care | Payer: Medicare PPO | Source: Ambulatory Visit | Attending: Internal Medicine | Admitting: Internal Medicine

## 2014-05-01 DIAGNOSIS — F17200 Nicotine dependence, unspecified, uncomplicated: Secondary | ICD-10-CM

## 2014-05-01 DIAGNOSIS — Z136 Encounter for screening for cardiovascular disorders: Secondary | ICD-10-CM

## 2014-05-03 ENCOUNTER — Encounter: Payer: Self-pay | Admitting: *Deleted

## 2014-05-16 ENCOUNTER — Ambulatory Visit: Payer: Medicare HMO | Admitting: Podiatry

## 2014-06-20 ENCOUNTER — Ambulatory Visit: Payer: Medicare HMO | Admitting: Podiatry

## 2014-06-23 ENCOUNTER — Other Ambulatory Visit: Payer: Self-pay | Admitting: Internal Medicine

## 2014-07-04 ENCOUNTER — Telehealth: Payer: Self-pay | Admitting: Internal Medicine

## 2014-07-04 DIAGNOSIS — K759 Inflammatory liver disease, unspecified: Secondary | ICD-10-CM

## 2014-07-04 NOTE — Telephone Encounter (Signed)
Yes please provide one

## 2014-07-04 NOTE — Telephone Encounter (Signed)
Patient called and now has Dr[facility] Glade LloydPandey [facility] and would like to know if he can get his referral for the Hep C Clinic.

## 2014-07-05 DIAGNOSIS — F1420 Cocaine dependence, uncomplicated: Secondary | ICD-10-CM | POA: Diagnosis not present

## 2014-07-05 DIAGNOSIS — F1020 Alcohol dependence, uncomplicated: Secondary | ICD-10-CM | POA: Diagnosis not present

## 2014-07-05 NOTE — Telephone Encounter (Signed)
Referral Placed 

## 2014-07-07 ENCOUNTER — Telehealth: Payer: Self-pay | Admitting: *Deleted

## 2014-07-07 NOTE — Telephone Encounter (Signed)
Patient notified of lab appointment on 07/13/14 at 2:30 pm. Provided address and phone number. Thomas Mcneil

## 2014-07-13 ENCOUNTER — Other Ambulatory Visit: Payer: Medicare PPO

## 2014-07-13 DIAGNOSIS — B182 Chronic viral hepatitis C: Secondary | ICD-10-CM | POA: Diagnosis not present

## 2014-07-13 LAB — PROTIME-INR
INR: 1.11 (ref <1.50)
Prothrombin Time: 14.3 seconds (ref 11.6–15.2)

## 2014-07-13 LAB — IRON: Iron: 146 ug/dL (ref 42–165)

## 2014-07-14 DIAGNOSIS — F1020 Alcohol dependence, uncomplicated: Secondary | ICD-10-CM | POA: Diagnosis not present

## 2014-07-14 DIAGNOSIS — F1420 Cocaine dependence, uncomplicated: Secondary | ICD-10-CM | POA: Diagnosis not present

## 2014-07-14 LAB — HIV ANTIBODY (ROUTINE TESTING W REFLEX): HIV 1&2 Ab, 4th Generation: NONREACTIVE

## 2014-07-14 LAB — HEPATITIS A ANTIBODY, TOTAL: HEP A TOTAL AB: NONREACTIVE

## 2014-07-14 LAB — ANA: Anti Nuclear Antibody(ANA): NEGATIVE

## 2014-07-14 LAB — HEPATITIS B SURFACE ANTIGEN: HEP B S AG: NEGATIVE

## 2014-07-14 LAB — HEPATITIS B CORE ANTIBODY, TOTAL: Hep B Core Total Ab: REACTIVE — AB

## 2014-07-14 LAB — HEPATITIS B SURFACE ANTIBODY,QUALITATIVE: Hep B S Ab: NEGATIVE

## 2014-07-18 LAB — HEPATITIS C RNA QUANTITATIVE
HCV QUANT LOG: 6.58 {Log} — AB (ref <1.18)
HCV QUANT: 3809018 [IU]/mL — AB (ref <15)

## 2014-07-19 LAB — HEPATITIS C GENOTYPE

## 2014-07-25 ENCOUNTER — Encounter: Payer: Self-pay | Admitting: Podiatry

## 2014-07-25 ENCOUNTER — Ambulatory Visit (INDEPENDENT_AMBULATORY_CARE_PROVIDER_SITE_OTHER): Payer: Commercial Managed Care - HMO | Admitting: Podiatry

## 2014-07-25 VITALS — BP 118/64 | HR 61 | Resp 16 | Ht 73.5 in | Wt 202.0 lb

## 2014-07-25 DIAGNOSIS — G629 Polyneuropathy, unspecified: Secondary | ICD-10-CM | POA: Diagnosis not present

## 2014-07-25 DIAGNOSIS — L600 Ingrowing nail: Secondary | ICD-10-CM

## 2014-07-25 MED ORDER — NEOMYCIN-POLYMYXIN-HC 3.5-10000-1 OT SOLN: OTIC | Status: DC

## 2014-07-25 NOTE — Progress Notes (Signed)
   Subjective:    Patient ID: Thomas Mcneil, male    DOB: 11/18/1950, 64 y.o.   MRN: [id number]  HPI Comments: Left great toenail , thick and painful, have had cut in past it just grows back the same way      Review of Systems  All other systems reviewed and are negative.      Objective:   Physical Exam I have reviewed his past medical history medications allergies surgery social history and review of systems. Pulses are strongly palpable left foot. Neurologic sensorium is decreased persons once the monofilament to the level of the ankles. Deep tendon reflexes are intact bilateral muscle strength is 5 over 5 dorsiflexion plantar flexors and inverters everters all just musculatures intact. Orthopedic evaluation of straight pes planus bilateral otherwise all joints distal to the ankle for range of motion without crepitation. Cutaneous evaluation demonstrates supple well-hydrated cutis severely thickened dystrophic painful nail hallux left the remainder of the nail plates appear to be perfectly normal with exception of the hallux right which has been removed in the past.        Assessment & Plan:  Assessment: Ingrown nail paronychia painful dystrophic nail hallux left.  Plan: Total matrixectomy hallux left after total nail avulsion. He tolerated this procedure well with local anesthetic hallux left. He was given both oral and written home-going instructions for the care of his toe we also dispensed NuRemedy for the neuropathy. I will follow-up with him in 1 week

## 2014-07-25 NOTE — Patient Instructions (Signed)

## 2014-07-31 DIAGNOSIS — F1120 Opioid dependence, uncomplicated: Secondary | ICD-10-CM | POA: Diagnosis not present

## 2014-08-01 ENCOUNTER — Encounter: Payer: Self-pay | Admitting: Internal Medicine

## 2014-08-01 DIAGNOSIS — F1020 Alcohol dependence, uncomplicated: Secondary | ICD-10-CM | POA: Diagnosis not present

## 2014-08-03 ENCOUNTER — Encounter: Payer: Self-pay | Admitting: Podiatry

## 2014-08-03 ENCOUNTER — Ambulatory Visit (INDEPENDENT_AMBULATORY_CARE_PROVIDER_SITE_OTHER): Payer: Medicare HMO | Admitting: Podiatry

## 2014-08-03 VITALS — BP 140/93 | HR 63 | Resp 18

## 2014-08-03 DIAGNOSIS — L600 Ingrowing nail: Secondary | ICD-10-CM

## 2014-08-04 NOTE — Progress Notes (Signed)
He presents today for 1 week follow-up of total nail avulsion hallux left. He continues to soak twice daily and Betadine and warm water and cover the toe with a Band-Aid.  Objective: Vital signs are stable he is alert and oriented 3. Hallux appears to be healing uneventfully without erythema cellulitis drainage or odor.  Assessment: Well healing surgical to hallux left.  Plan: Discontinue Betadine and sterile with Epsom salts and warm water soaks. Continue to soak twice daily and covered during the day and leave open at night until completely healed. I will follow-up with him with any questions or concerns.

## 2014-08-09 ENCOUNTER — Telehealth: Payer: Self-pay | Admitting: *Deleted

## 2014-08-09 ENCOUNTER — Ambulatory Visit (INDEPENDENT_AMBULATORY_CARE_PROVIDER_SITE_OTHER): Payer: Commercial Managed Care - HMO | Admitting: Internal Medicine

## 2014-08-09 ENCOUNTER — Encounter: Payer: Self-pay | Admitting: Internal Medicine

## 2014-08-09 VITALS — BP 146/83 | HR 62 | Temp 98.1°F | Ht 72.0 in | Wt 193.0 lb

## 2014-08-09 DIAGNOSIS — F1021 Alcohol dependence, in remission: Secondary | ICD-10-CM | POA: Diagnosis not present

## 2014-08-09 DIAGNOSIS — Z23 Encounter for immunization: Secondary | ICD-10-CM

## 2014-08-09 DIAGNOSIS — B182 Chronic viral hepatitis C: Secondary | ICD-10-CM

## 2014-08-09 MED ORDER — LEDIPASVIR-SOFOSBUVIR 90-400 MG PO TABS: 1.0000 | ORAL_TABLET | Freq: Every day | ORAL | Status: DC

## 2014-08-09 NOTE — Telephone Encounter (Signed)
Due to patient's insurance, order for Ultrasound abdomen complete with elastography needs to come from PCP, Dr. Volney PresserPandey's office.  This RN spoke with referral coordinator, Nicole CellaDorothy to relay the information.  CPT code for the test is 91200.  Dr. Luciana Axeomer would prefer for the patient to have this study completed at [facility].  Patient is to be NPO (nothing to eat/drink) for 6 hours prior to the procedure. RN left message with this information on Dorothy's voicemail, asked her to call back and confirm. Andree CossHowell, Jayde Mcallister M, RN

## 2014-08-09 NOTE — Progress Notes (Signed)
+Thomas Mcneil is a 64 y.o. male who presents for initial evaluation and management of a positive Hepatitis C antibody test.  Patient tested positive more than 10 years ago. Hepatitis C risk factors present are: IV drug abuse (details: remote, more than 10 years ago). Patient denies history of blood transfusion, sexual contact with person with liver disease, tattoos. Patient has had other studies performed. Results: hepatitis C RNA by PCR, result: positive. Patient has not had prior treatment for Hepatitis C. Patient does not have a past history of liver disease. Patient does not have a family history of liver disease.   HPI: He likely acquired years ago from IV drugs but no IV drugs now.  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  No current drug use.     He did have a liver biopsy somewhere else before and did not have cirrhosis by his report, though some liver damage he was unsure of.    Patient does not have documented immunity to Hepatitis A. Patient does have documented immunity to Hepatitis B.     Review of Systems A comprehensive review of systems was negative.   Past Medical History  Diagnosis Date  . Depression   . GERD (gastroesophageal reflux disease)   . Complication of anesthesia     slow to wake up after last surgery  . Peripheral neuropathy     feet and hands  . Arthritis   . Hepatitis     hep c  . High cholesterol   . High blood pressure   . Neuropathy     Prior to Admission medications   Medication Sig Start Date End Date Taking? Authorizing Provider  AMBULATORY NON FORMULARY MEDICATION Medication Name: Kre-Alkalyn 1500 mg 2 by mouth daily   Yes Historical Provider, MD  Ascorbic Acid (VITAMIN C) 1000 MG tablet Take 1,000 mg by mouth daily.   Yes Historical Provider, MD  escitalopram (LEXAPRO) 20 MG tablet Take 20 mg by mouth daily.   Yes Historical Provider, MD   fluticasone (FLONASE) 50 MCG/ACT nasal spray Place 2 sprays into the nose daily.   Yes Historical Provider, MD  folic acid (FOLVITE) 1 MG tablet Take 1 tablet (1 mg total) by mouth daily. 04/05/14  Yes Mahima Pandey, MD  gabapentin (NEURONTIN) 300 MG capsule TAKE 1 CAPSULE (300 MG) BY ORAL ROUTE 2 TIMES PER DAY 06/23/14  Yes Kimber RelicArthur G Green, MD  hydrOXYzine (ATARAX/VISTARIL) 25 MG tablet Take 25 mg by mouth daily as needed for itching (for allergies). For allergies   Yes Historical Provider, MD  lovastatin (MEVACOR) 20 MG tablet Take 20 mg by mouth at bedtime. For High Cholesterol   Yes Historical Provider, MD  Multiple Vitamin (MULTIVITAMIN WITH MINERALS) TABS Take 1 tablet by mouth daily.   Yes Historical Provider, MD  neomycin-polymyxin-hydrocortisone (CORTISPORIN) otic solution 1- 2  Drops to the toe after soaking twice daily 07/25/14  Yes Max T Hyatt, DPM  omeprazole (PRILOSEC) 40 MG capsule Take 40 mg by mouth daily.   Yes Historical Provider, MD  primidone (MYSOLINE) 50 MG tablet Take 1 tablet (50 mg total) by mouth at bedtime. 01/04/14  Yes Mahima Glade LloydPandey, MD  thiamine (VITAMIN B-1) 100 MG tablet Take 1 tablet (100 mg total) by mouth daily. 04/05/14  Yes Mahima Glade LloydPandey, MD  traZODone (DESYREL) 50 MG tablet Take 50 mg by mouth at bedtime. 07/04/14  Yes Historical Provider, MD  vitamin B-12 (CYANOCOBALAMIN) 1000 MCG tablet Take 1,000 mcg by mouth daily.   Yes Historical Provider, MD  propranolol (INDERAL) 80 MG tablet Take 80 mg by mouth 2 (two) times daily.    Historical Provider, MD    No Known Allergies  History  Substance Use Topics  . Smoking status: Current Some Day Smoker -- 0.25 packs/day for 45 years    Types: Cigarettes    Start date: 06/09/2012  . Smokeless tobacco: Not on file     Comment: quitting, using e-cig  . Alcohol Use: 0.0 oz/week    0 Standard drinks or equivalent per week     Comment: Relaspe - currently in [facility] for counseling/rehab. last drink 06/29/14    Family  History  Problem Relation Age of Onset  . Hypertension Mother   . Diverticulosis Mother   . GER disease Mother   . Lung disease Sister   . Polymyositis Sister       Objective:   Filed Vitals:   08/09/14 1450  BP: 146/83  Pulse: 62  Temp: 98.1 F (36.7 C)   in no apparent distress and alert HEENT: anicteric Cor RRR and No murmurs clear Bowel sounds are normal, liver is not enlarged, spleen is not enlarged peripheral pulses normal, no pedal edema, no clubbing or cyanosis negative for - jaundice, spider hemangioma, telangiectasia, palmar erythema, ecchymosis and atrophy  Laboratory Genotype:  Lab Results  Component Value Date   HCVGENOTYPE 1a 07/13/2014   HCV viral load:  Lab Results  Component Value Date   HCVQUANT [id number]* 07/13/2014   Lab Results  Component Value Date   WBC 6.9 03/29/2014   HGB 16.8 03/29/2014   HCT 47.3 03/29/2014   MCV 90 03/29/2014   PLT 180 10/26/2012    Lab Results  Component Value Date   CREATININE 1.25 03/29/2014   BUN 10 03/29/2014   NA 142 03/29/2014   K 4.2 03/29/2014   CL 103 03/29/2014   CO2 23 03/29/2014    Lab Results  Component Value Date   ALT 16 03/29/2014   AST 21 03/29/2014   ALKPHOS 74 03/29/2014   BILITOT 1.2 03/29/2014   INR 1.11 07/13/2014      Assessment: Chronic Hepatitis C genotype 1a  Plan: 1) Patient counseled extensively on limiting acetaminophen to no more than 2 grams daily, avoidance of alcohol. 2) Transmission discussed with patient including sexual transmission, sharing razors and toothbrush.   3) Will need referral to gastroenterology if concern for cirrhosis 4) Will need referral for substance abuse counseling: No. He already is in a program and doing well.  Will check UDS as well.   5) Will prescribe Harvoni for 12 weeks once work up complete 6) Hepatitis A vaccine Yes.   7) Hepatitis B vaccine No. 8) Pneumovax vaccine if concern for cirrhosis 9) will follow up after starting medication  or in 1 year if denied

## 2014-08-09 NOTE — Patient Instructions (Signed)
Date 08/09/2014  Dear Thomas Mcneil, As discussed in the ID Clinic, your hepatitis C therapy will include the following medications:          Harvoni 90mg /400mg  tablet:           Take 1 tablet by mouth once daily   Please note that ALL MEDICATIONS WILL START ON THE SAME DATE for a total of 12 weeks. ---------------------------------------------------------------- Your HCV Treatment Start Date: TBA   Your HCV genotype:  1a    Liver Fibrosis: TBD    ---------------------------------------------------------------- YOUR Mcneil CONTACT:   Thomas Mcneil [address] Phone: [phone] Hours: Monday to Friday 7:30 am to 6:00 pm   Please always contact your Mcneil at least 3-4 business days before you run out of medications to ensure your next month's medication is ready or 1 week prior to running out if you receive it by mail.  Remember, each prescription is for 28 days. ---------------------------------------------------------------- GENERAL NOTES REGARDING YOUR HEPATITIS C MEDICATION:  SOFOSBUVIR/LEDIPASVIR (HARVONI): - Harvoni tablet is taken daily with OR without food. - The tablets are orange. - The tablets should be stored at room temperature.  - Acid reducing agents such as H2 blockers (ie. Pepcid (famotidine), Zantac (ranitidine), Tagamet (cimetidine), Axid (nizatidine) and proton pump inhibitors (ie. Prilosec (omeprazole), Protonix (pantoprazole), Nexium (esomeprazole), or Aciphex (rabeprazole)) can decrease effectiveness of Harvoni. Do not take until you have discussed with a health care provider.    -Antacids that contain magnesium and/or aluminum hydroxide (ie. Milk of Magensia, Rolaids, Gaviscon, Maalox, Mylanta, an dArthritis Pain Formula)can reduce absorption of Harvoni, so take them at least 4 hours before or after Harvoni.  -Calcium carbonate (calcium supplements or antacids such as Tums, Caltrate,  Os-Cal)needs to be taken at least 4 hours hours before or after Harvoni.  -Thomas Mcneil or any products that contain Thomas Mcneil like some herbal supplements  Please inform the office prior to starting any of these medications.  - The common side effects with Harvoni:      1. Fatigue      2. Headache      3. Nausea      4. Diarrhea      5. Insomnia   Support Path is a suite of resources designed to help patients start with HARVONI and move toward treatment completion GETTING STARTED Support Path helps patients access therapy and get off to an efficient start  Benefits investigation and prior authorization support Co-pay and other financial assistance A specialty Mcneil finder CO-PAY COUPON The HARVONI co-pay coupon may help eligible patients lower their out-of-pocket costs. With a co-pay coupon, most eligible patients may pay no more than $5 per co-pay (restrictions apply) www.harvoni.com call [phone] Not valid for patients enrolled in government healthcare prescription drug programs, such as Medicare Part D and Medicaid. Patients in the coverage gap known as the "donut hole" also are not eligible The HARVONI co-pay coupon program will cover the out-of-pocket costs for HARVONI prescriptions up to a maximum of 25% of the catalog price of a 12-week regimen of HARVONI  Please note that this only lists the most common side effects and is NOT a comprehensive list of the potential side effects of these medications. For more information, please review the drug information sheets that come with your medication package from the Mcneil.  ---------------------------------------------------------------- GENERAL HELPFUL HINTS ON HCV THERAPY: 1. No alcohol. 2. Protect against sun-sensitivity/sunburns (wear sunglasses, hat, long sleeves, pants  and sunscreen). 3. Stay well-hydrated/well-moisturized. 4. Notify the ID Clinic of any changes in your other over-the-counter/herbal or  prescription medications. 5. If you miss a dose of your medication, take the missed dose as soon as you remember. Return to your regular time/dose schedule the next day.  6.  Do not stop taking your medications without first talking with your healthcare provider. 7.  You may take Tylenol (acetaminophen), as long as the dose is less than 2000 mg (OR no more than 4 tablets of the Tylenol Extra Strengths 500mg  tablet) in 24 hours. 8.  You will need to obtain routine labs and/or office visits at [facility] at weeks 2, 4, 8,  and 12 as well as 12 and 24 weeks after completion of treatment.   Scharlene Gloss, [place] for [address] [place] [place], [place]  [phone]

## 2014-08-10 ENCOUNTER — Encounter: Payer: Self-pay | Admitting: Internal Medicine

## 2014-08-10 ENCOUNTER — Ambulatory Visit: Payer: Medicare HMO | Admitting: Podiatry

## 2014-08-10 LAB — DRUG SCREEN, URINE
Amphetamine Screen, Ur: NEGATIVE
BARBITURATE QUANT UR: POSITIVE — AB
Benzodiazepines.: NEGATIVE
Cocaine Metabolites: NEGATIVE
Creatinine,U: 176.11 mg/dL
METHADONE: NEGATIVE
Marijuana Metabolite: NEGATIVE
Opiates: NEGATIVE
PROPOXYPHENE: NEGATIVE
Phencyclidine (PCP): NEGATIVE

## 2014-08-10 NOTE — Addendum Note (Signed)
Addended by: Andree CossHOWELL, Chou Busler M on: 08/10/2014 04:21 PM   Modules accepted: Orders

## 2014-08-11 NOTE — Telephone Encounter (Signed)
Per Nicole Cellaorothy, AT[employer]patient's insurance does not require preauthorization for elastography.  Ok to schedule.

## 2014-08-14 ENCOUNTER — Ambulatory Visit (HOSPITAL_COMMUNITY)
Admission: RE | Admit: 2014-08-14 | Discharge: 2014-08-14 | Disposition: A | Discharge status: Home / Self Care | Payer: Commercial Managed Care - HMO | Source: Ambulatory Visit | Attending: Internal Medicine | Admitting: Internal Medicine

## 2014-08-14 DIAGNOSIS — B182 Chronic viral hepatitis C: Secondary | ICD-10-CM

## 2014-08-14 NOTE — Telephone Encounter (Signed)
Does he need follow up scheduled? Thanks, Marcelino DusterMichelle

## 2014-08-14 NOTE — Telephone Encounter (Signed)
Patient had elastography 3/7.

## 2014-08-15 ENCOUNTER — Ambulatory Visit (INDEPENDENT_AMBULATORY_CARE_PROVIDER_SITE_OTHER): Payer: Commercial Managed Care - HMO | Admitting: Podiatry

## 2014-08-15 ENCOUNTER — Encounter: Payer: Self-pay | Admitting: Podiatry

## 2014-08-15 DIAGNOSIS — L600 Ingrowing nail: Secondary | ICD-10-CM | POA: Diagnosis not present

## 2014-08-15 MED ORDER — SULFAMETHOXAZOLE-TRIMETHOPRIM 800-160 MG PO TABS: ORAL_TABLET | ORAL | Status: DC

## 2014-08-15 NOTE — Progress Notes (Signed)
He presents today 3 weeks status post nail avulsion hallux left. He states this seems to be doing better and is no longer painful.  Objective: Vital signs are stable he is alert and oriented 3 total nail avulsion matrixectomy hallux left demonstrates mild erythema surrounding the surgical site mild granulation tissue and fibrin deposition. Pulses remain palpable.  Assessment: Possible superficial cellulitic infection hallux left.  Plan: Continue all conservative therapies including soaking in Epsom salts and warm water. I also encouraged him to cover during the day and leave open at night time. I also wrote him a prescription for Bactrim DS 2 tablets twice daily. Follow-up with him in 2 weeks.

## 2014-08-15 NOTE — Patient Instructions (Signed)

## 2014-08-16 DIAGNOSIS — F1020 Alcohol dependence, uncomplicated: Secondary | ICD-10-CM | POA: Diagnosis not present

## 2014-08-18 ENCOUNTER — Other Ambulatory Visit: Payer: Self-pay | Admitting: *Deleted

## 2014-08-18 MED ORDER — PROPRANOLOL HCL 80 MG PO TABS: 80.0000 mg | ORAL_TABLET | Freq: Two times a day (BID) | ORAL | Status: DC

## 2014-08-18 NOTE — Telephone Encounter (Signed)
Patient called and requested to be faxed to pharmacy. Confirmed with patient that is was 80mg  twice daily. Agreed.

## 2014-08-24 ENCOUNTER — Telehealth: Payer: Self-pay | Admitting: Pharmacist Clinician (PhC)/ Clinical Pharmacy Specialist

## 2014-08-24 NOTE — Telephone Encounter (Signed)
HPI: Thomas Mcneil is a 64 y.o. male who started on Harvoni on 3/15. Call to f/u  Lab Results  Component Value Date   HCVGENOTYPE 1a 07/13/2014    Allergies: No Known Allergies  Vitals:    Past Medical History: Past Medical History  Diagnosis Date  . Depression   . GERD (gastroesophageal reflux disease)   . Complication of anesthesia     slow to wake up after last surgery  . Peripheral neuropathy     feet and hands  . Arthritis   . Hepatitis     hep c  . High cholesterol   . High blood pressure   . Neuropathy     Social History: History   Social History  . Marital Status: Widowed    Spouse Name: N/A  . Number of Children: N/A  . Years of Education: N/A   Social History Main Topics  . Smoking status: Current Some Day Smoker -- 0.25 packs/day for 45 years    Types: Cigarettes    Start date: 06/09/2012  . Smokeless tobacco: Not on file     Comment: quitting, using e-cig  . Alcohol Use: 0.0 oz/week    0 Standard drinks or equivalent per week     Comment: Relas[facility] - currently in [facility] for counseling/rehab. last drink 06/29/14  . Drug Use: Yes     Comment: "Crack" quit 06/29/14  . Sexual Activity: Not on file   Other Topics Concern  . Not on file   Social History Narrative    Labs: HEP B S AB (no units)  Date Value  07/13/2014 NEG   HEPATITIS B SURFACE AG (no units)  Date Value  07/13/2014 NEGATIVE   HCV AB (no units)  Date Value  08/30/2008 REACTIVE*    Lab Results  Component Value Date   HCVGENOTYPE 1a 07/13/2014    Hepatitis C RNA quantitative Latest Ref Rng 07/13/2014 09/21/2008  HCV Quantitative <15 IU/mL 3809018(H) 5890000(H)  HCV Quantitative Log <1.18 log 10 6.58(H) -    AST  Date Value  03/29/2014 21 IU/L  10/24/2012 16 U/L  10/23/2012 20 U/L   ALT  Date Value  03/29/2014 16 IU/L  10/24/2012 13 U/L  10/23/2012 18 U/L   INR (no units)  Date Value  07/13/2014 1.11  10/23/2012 1.00  11/09/2008 1.0    CrCl: CrCl cannot be  calculated (Patient has no serum creatinine result on file.).  Fibrosis Score: F2/3 as assessed by [facility]  Child-Pugh Score: Class A  Previous Treatment Regimen: Naive  Assessment: 64 yo who has never been treated for hep C. He was recently eval by Dr. Luciana Axeomer and [facility] He recently picked it up from outpt pharmacy has started on it on 3/15. Doing well on it. He has stopped his prilosec 40mg . Told him to call back if he has issue with his reflux. We can then limit the dose to 20mg /day if needed.   Recommendations:  Cont harvoni 1 PO qday Stop prilosec See back in 1 month for labs  Ulyses [place], Alesia Oshields [place] Gres[place], VermontPharm.D., BCPS, AAHIVP Clinical Infectious Disease Pharmacist [facility] 08/24/2014, 1:37 PM

## 2014-08-25 ENCOUNTER — Telehealth: Payer: Self-pay | Admitting: *Deleted

## 2014-08-25 ENCOUNTER — Other Ambulatory Visit: Payer: Self-pay | Admitting: *Deleted

## 2014-08-25 MED ORDER — PRIMIDONE 50 MG PO TABS: 50.0000 mg | ORAL_TABLET | Freq: Every day | ORAL | Status: DC

## 2014-08-25 MED ORDER — PROPRANOLOL HCL 80 MG PO TABS: 80.0000 mg | ORAL_TABLET | Freq: Two times a day (BID) | ORAL | Status: DC

## 2014-08-25 MED ORDER — OMEPRAZOLE 40 MG PO CPDR: DELAYED_RELEASE_CAPSULE | ORAL | Status: DC

## 2014-08-25 MED ORDER — LOVASTATIN 20 MG PO TABS: 20.0000 mg | ORAL_TABLET | Freq: Every day | ORAL | Status: DC

## 2014-08-25 MED ORDER — GABAPENTIN 300 MG PO CAPS: ORAL_CAPSULE | ORAL | Status: DC

## 2014-08-25 NOTE — Telephone Encounter (Signed)
Called patient and left a voice mail that we were notified that [facility] and he needs to call the clinic to schedule a follow up for labs in 4 weeks and to see Dr. Luciana Axeomer in 5-6 weeks.

## 2014-08-25 NOTE — Telephone Encounter (Signed)
[facility] 

## 2014-08-28 DIAGNOSIS — F1020 Alcohol dependence, uncomplicated: Secondary | ICD-10-CM | POA: Diagnosis not present

## 2014-08-29 ENCOUNTER — Encounter: Payer: Self-pay | Admitting: Podiatry

## 2014-08-29 ENCOUNTER — Ambulatory Visit (INDEPENDENT_AMBULATORY_CARE_PROVIDER_SITE_OTHER): Payer: Commercial Managed Care - HMO | Admitting: Podiatry

## 2014-08-29 ENCOUNTER — Other Ambulatory Visit: Payer: Self-pay | Admitting: *Deleted

## 2014-08-29 DIAGNOSIS — L600 Ingrowing nail: Secondary | ICD-10-CM | POA: Diagnosis not present

## 2014-08-29 MED ORDER — LOVASTATIN 20 MG PO TABS: 20.0000 mg | ORAL_TABLET | Freq: Every day | ORAL | Status: DC

## 2014-08-29 MED ORDER — GABAPENTIN 300 MG PO CAPS: ORAL_CAPSULE | ORAL | Status: DC

## 2014-08-29 MED ORDER — OMEPRAZOLE 40 MG PO CPDR: DELAYED_RELEASE_CAPSULE | ORAL | Status: DC

## 2014-08-29 MED ORDER — PROPRANOLOL HCL 80 MG PO TABS: 80.0000 mg | ORAL_TABLET | Freq: Two times a day (BID) | ORAL | Status: DC

## 2014-08-29 MED ORDER — PRIMIDONE 50 MG PO TABS: 50.0000 mg | ORAL_TABLET | Freq: Every day | ORAL | Status: DC

## 2014-08-29 NOTE — Telephone Encounter (Signed)
[facility] fax order 

## 2014-08-29 NOTE — Progress Notes (Signed)
He presents today for follow-up of a nail avulsion hallux right. He states that he really doesn't seem to be getting much better to me.  Objective: He states that he continues to soak on a regular basis however he has fibrin deposition over the toe with minimal granulation tissue and minimal epithelialization at this point. The toe does not appear to be infected there is no purulence or malodor. There is no signs of cellulitis. There is no probing of the wound deep.  Assessment: Open wound hallux right minimal healing fibrin deposition.  Plan: I cleaned up the wound today removing all fibrin deposition encouraged soaking Epsom salts and warm water and cleaning the wound daily as I instructed him on how to do this. I will follow-up with him in a couple weeks to make sure he is doing this.

## 2014-09-12 ENCOUNTER — Ambulatory Visit: Payer: Commercial Managed Care - HMO | Admitting: Podiatry

## 2014-09-12 ENCOUNTER — Ambulatory Visit: Payer: Medicare HMO | Admitting: Internal Medicine

## 2014-09-12 ENCOUNTER — Other Ambulatory Visit: Payer: Self-pay | Admitting: *Deleted

## 2014-09-12 MED ORDER — PRIMIDONE 50 MG PO TABS: 50.0000 mg | ORAL_TABLET | Freq: Every day | ORAL | Status: DC

## 2014-09-12 MED ORDER — OMEPRAZOLE 40 MG PO CPDR: DELAYED_RELEASE_CAPSULE | ORAL | Status: DC

## 2014-09-12 MED ORDER — LOVASTATIN 20 MG PO TABS: 20.0000 mg | ORAL_TABLET | Freq: Every day | ORAL | Status: DC

## 2014-09-12 MED ORDER — GABAPENTIN 300 MG PO CAPS: ORAL_CAPSULE | ORAL | Status: DC

## 2014-09-12 MED ORDER — PROPRANOLOL HCL 80 MG PO TABS: 80.0000 mg | ORAL_TABLET | Freq: Two times a day (BID) | ORAL | Status: DC

## 2014-09-12 NOTE — Telephone Encounter (Signed)
[facility] Fax Order

## 2014-09-14 ENCOUNTER — Ambulatory Visit: Payer: Medicare HMO | Admitting: Nurse Practitioner

## 2014-09-14 ENCOUNTER — Encounter: Payer: Self-pay | Admitting: Internal Medicine

## 2014-09-14 DIAGNOSIS — Z0289 Encounter for other administrative examinations: Secondary | ICD-10-CM

## 2014-09-19 ENCOUNTER — Other Ambulatory Visit: Payer: Commercial Managed Care - HMO

## 2014-10-12 ENCOUNTER — Ambulatory Visit: Payer: Commercial Managed Care - HMO | Admitting: Nurse Practitioner

## 2014-10-17 ENCOUNTER — Ambulatory Visit: Payer: Commercial Managed Care - HMO | Admitting: Internal Medicine

## 2014-11-16 ENCOUNTER — Ambulatory Visit: Payer: Commercial Managed Care - HMO | Admitting: Nurse Practitioner

## 2014-11-28 ENCOUNTER — Ambulatory Visit: Payer: Commercial Managed Care - HMO | Admitting: Nurse Practitioner

## 2014-12-14 ENCOUNTER — Ambulatory Visit: Payer: Commercial Managed Care - HMO | Admitting: Internal Medicine

## 2014-12-19 ENCOUNTER — Encounter: Payer: Self-pay | Admitting: Nurse Practitioner

## 2014-12-19 ENCOUNTER — Ambulatory Visit: Payer: Commercial Managed Care - HMO | Admitting: Nurse Practitioner

## 2014-12-19 DIAGNOSIS — Z0289 Encounter for other administrative examinations: Secondary | ICD-10-CM

## 2014-12-20 ENCOUNTER — Telehealth: Payer: Self-pay | Admitting: Nurse Practitioner

## 2014-12-20 NOTE — Telephone Encounter (Signed)
Called patient on 12/19/2014 and 12/20/2014 and left message for patient call and reschedule missed appointment from 12/19/2014.

## 2015-01-16 ENCOUNTER — Telehealth: Payer: Self-pay | Admitting: Nurse Practitioner

## 2015-01-16 NOTE — Telephone Encounter (Signed)
Patient missed his appointment and we have place many calls trying to get patient to reschedule. Patient was called on 12/20/2014, 12/26/2014 and 01/16/2015 with no response from the patient. Voicemails were left for patient to call back.

## 2015-01-24 NOTE — Telephone Encounter (Signed)
Patient returned call. Patient states he is on a fixed income and is unable to come in because he does not have 10 dollar co-pay at this time. Patient was informed that we do encourage co-payments at the time of visit, yet that should not be his determining factor to be proactive in his health care needs. Patient informed we can bill for co-pay. Patient states he would of scheduled a while ago if he had known he could be billed.

## 2015-01-30 ENCOUNTER — Encounter: Payer: Self-pay | Admitting: Nurse Practitioner

## 2015-01-30 ENCOUNTER — Ambulatory Visit: Payer: Self-pay | Admitting: Nurse Practitioner

## 2015-02-06 ENCOUNTER — Ambulatory Visit: Payer: Commercial Managed Care - HMO | Admitting: Nurse Practitioner

## 2015-02-06 ENCOUNTER — Encounter: Payer: Self-pay | Admitting: Nurse Practitioner

## 2015-02-06 DIAGNOSIS — Z0289 Encounter for other administrative examinations: Secondary | ICD-10-CM

## 2015-03-28 DIAGNOSIS — F331 Major depressive disorder, recurrent, moderate: Secondary | ICD-10-CM | POA: Diagnosis not present

## 2015-04-26 IMAGING — CR DG CHEST 2V
2 series · 2 of 2 positions shown · non-contrast
Comparison: 03/18/2013

CLINICAL DATA: Cough.

EXAM:
CHEST  2 VIEW

[w chest pa]
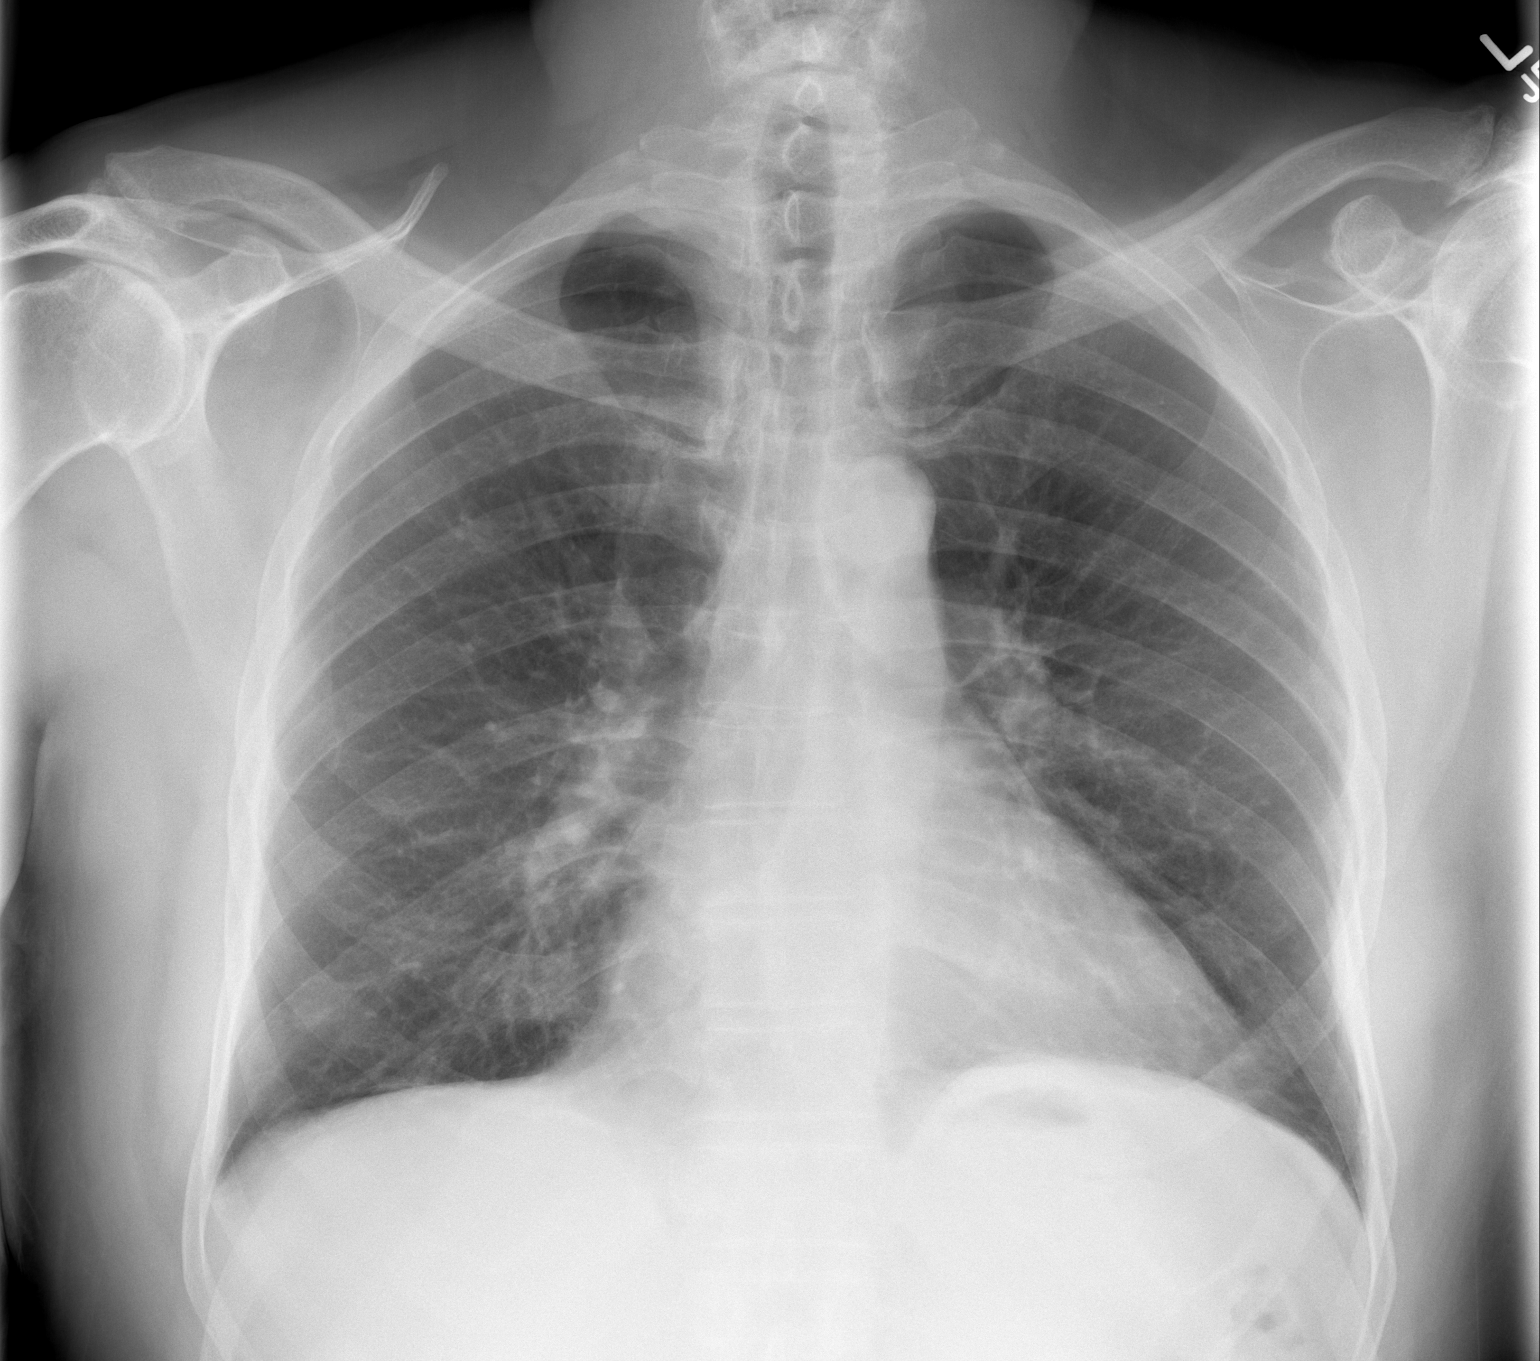

[w chest lat]
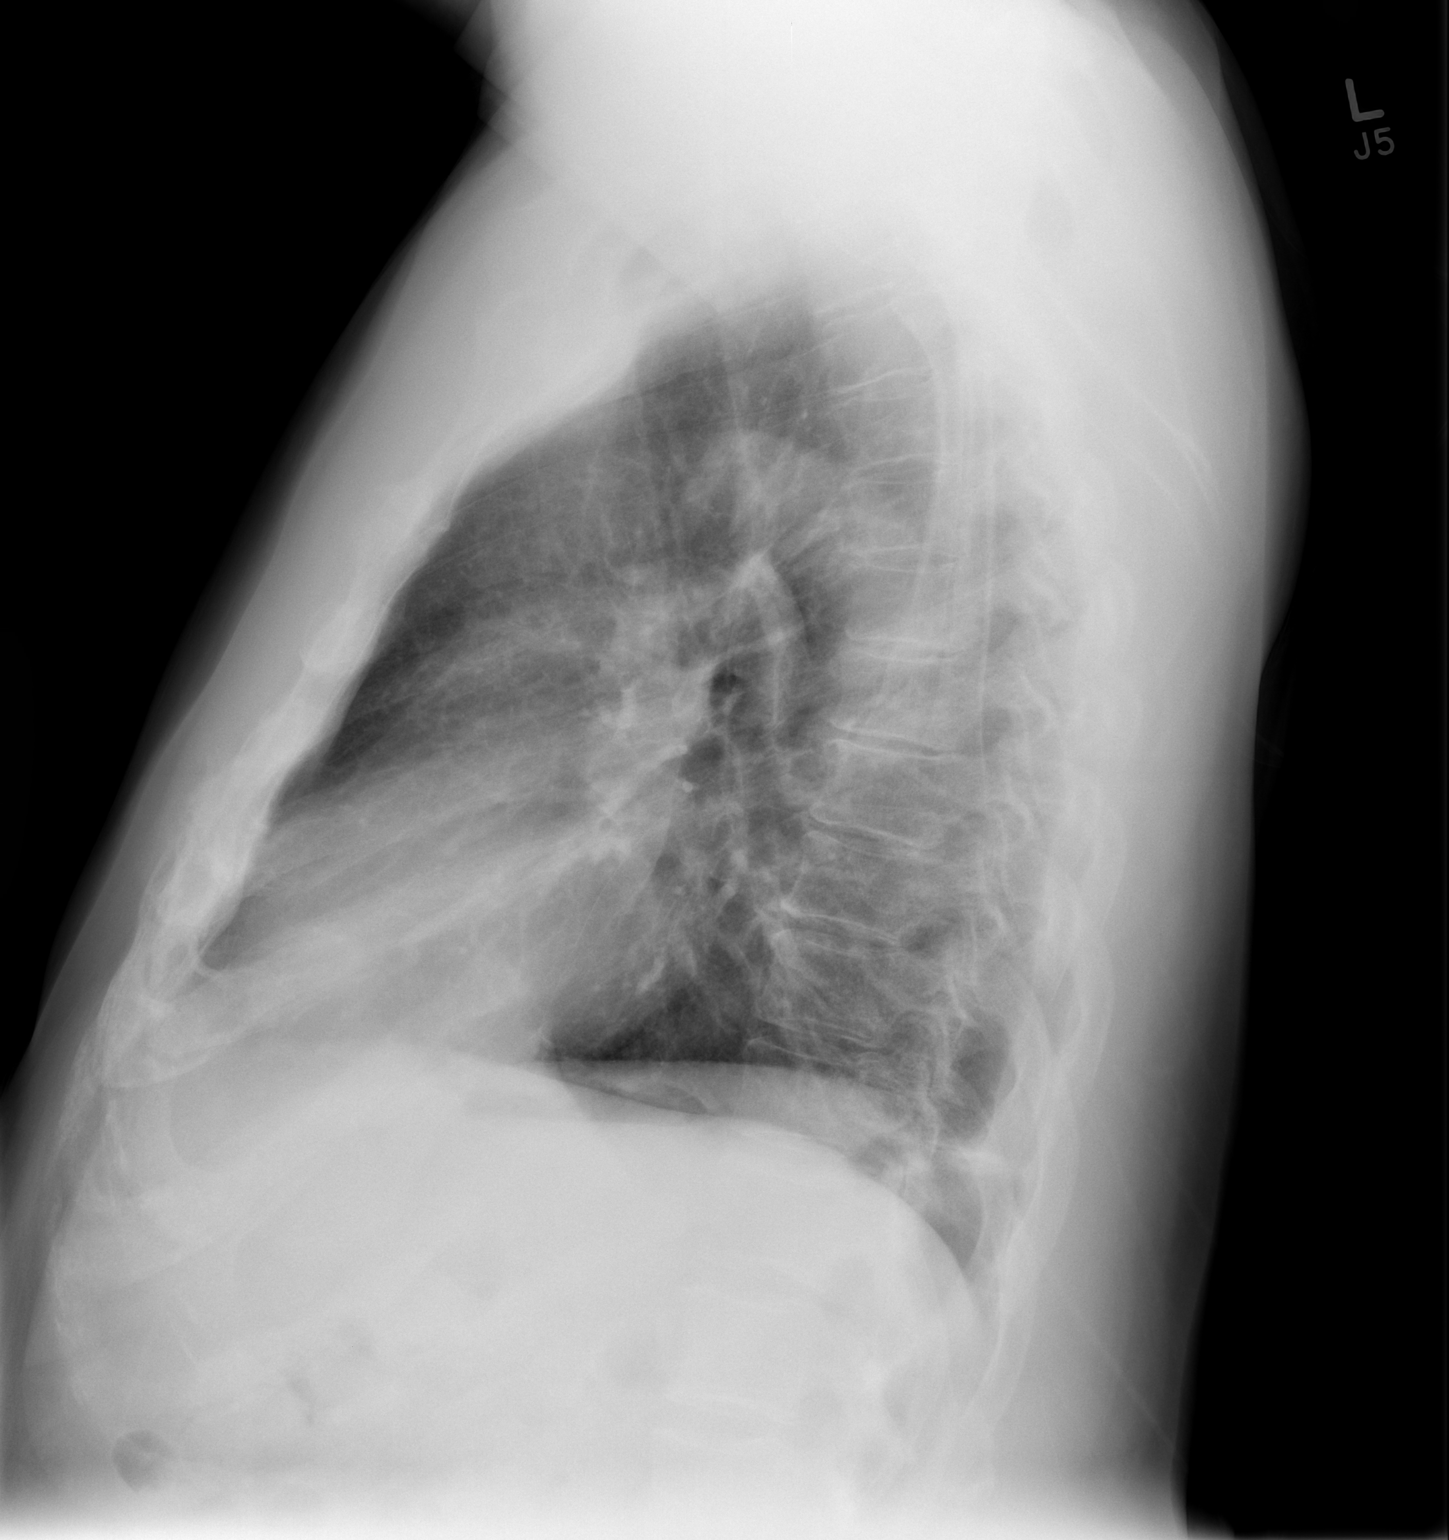

[2 of 2 positions shown; findings below may reference images not displayed]

FINDINGS: The heart size and mediastinal contours are within normal limits.

The lungs are clear. No pleural effusion or pneumothorax.

Bony thorax is intact.
IMPRESSION: No active cardiopulmonary disease.

## 2015-04-30 DIAGNOSIS — K219 Gastro-esophageal reflux disease without esophagitis: Secondary | ICD-10-CM | POA: Diagnosis not present

## 2015-04-30 DIAGNOSIS — I10 Essential (primary) hypertension: Secondary | ICD-10-CM | POA: Diagnosis not present

## 2015-04-30 DIAGNOSIS — Z6825 Body mass index (BMI) 25.0-25.9, adult: Secondary | ICD-10-CM | POA: Diagnosis not present

## 2015-04-30 DIAGNOSIS — F331 Major depressive disorder, recurrent, moderate: Secondary | ICD-10-CM | POA: Diagnosis not present

## 2015-04-30 DIAGNOSIS — E782 Mixed hyperlipidemia: Secondary | ICD-10-CM | POA: Diagnosis not present

## 2015-04-30 DIAGNOSIS — G629 Polyneuropathy, unspecified: Secondary | ICD-10-CM | POA: Diagnosis not present

## 2015-04-30 DIAGNOSIS — Z0000 Encounter for general adult medical examination without abnormal findings: Secondary | ICD-10-CM | POA: Diagnosis not present

## 2015-08-31 ENCOUNTER — Emergency Department (HOSPITAL_COMMUNITY)
Admission: EM | Admit: 2015-08-31 | Discharge: 2015-09-02 | Disposition: A | Discharge status: Other Acute Inpatient Hospital | Payer: Commercial Managed Care - HMO | Attending: Emergency Medicine | Admitting: Emergency Medicine

## 2015-08-31 ENCOUNTER — Encounter (HOSPITAL_COMMUNITY): Payer: Self-pay | Admitting: Emergency Medicine

## 2015-08-31 DIAGNOSIS — Z79899 Other long term (current) drug therapy: Secondary | ICD-10-CM | POA: Insufficient documentation

## 2015-08-31 DIAGNOSIS — R45851 Suicidal ideations: Secondary | ICD-10-CM

## 2015-08-31 DIAGNOSIS — F1094 Alcohol use, unspecified with alcohol-induced mood disorder: Secondary | ICD-10-CM | POA: Diagnosis present

## 2015-08-31 DIAGNOSIS — F329 Major depressive disorder, single episode, unspecified: Secondary | ICD-10-CM | POA: Insufficient documentation

## 2015-08-31 DIAGNOSIS — R45850 Homicidal ideations: Secondary | ICD-10-CM | POA: Diagnosis not present

## 2015-08-31 DIAGNOSIS — F29 Unspecified psychosis not due to a substance or known physiological condition: Secondary | ICD-10-CM | POA: Diagnosis not present

## 2015-08-31 DIAGNOSIS — E7800 Pure hypercholesterolemia, unspecified: Secondary | ICD-10-CM | POA: Insufficient documentation

## 2015-08-31 DIAGNOSIS — F1410 Cocaine abuse, uncomplicated: Secondary | ICD-10-CM | POA: Diagnosis present

## 2015-08-31 DIAGNOSIS — F17210 Nicotine dependence, cigarettes, uncomplicated: Secondary | ICD-10-CM | POA: Insufficient documentation

## 2015-08-31 DIAGNOSIS — G629 Polyneuropathy, unspecified: Secondary | ICD-10-CM | POA: Insufficient documentation

## 2015-08-31 DIAGNOSIS — F1024 Alcohol dependence with alcohol-induced mood disorder: Secondary | ICD-10-CM | POA: Insufficient documentation

## 2015-08-31 DIAGNOSIS — F99 Mental disorder, not otherwise specified: Secondary | ICD-10-CM | POA: Diagnosis not present

## 2015-08-31 DIAGNOSIS — Z8739 Personal history of other diseases of the musculoskeletal system and connective tissue: Secondary | ICD-10-CM | POA: Diagnosis not present

## 2015-08-31 DIAGNOSIS — I10 Essential (primary) hypertension: Secondary | ICD-10-CM | POA: Diagnosis not present

## 2015-08-31 DIAGNOSIS — Z8619 Personal history of other infectious and parasitic diseases: Secondary | ICD-10-CM | POA: Diagnosis not present

## 2015-08-31 DIAGNOSIS — F1490 Cocaine use, unspecified, uncomplicated: Secondary | ICD-10-CM

## 2015-08-31 DIAGNOSIS — F1010 Alcohol abuse, uncomplicated: Secondary | ICD-10-CM

## 2015-08-31 DIAGNOSIS — F1020 Alcohol dependence, uncomplicated: Secondary | ICD-10-CM

## 2015-08-31 DIAGNOSIS — K219 Gastro-esophageal reflux disease without esophagitis: Secondary | ICD-10-CM | POA: Diagnosis not present

## 2015-08-31 DIAGNOSIS — Y907 Blood alcohol level of 200-239 mg/100 ml: Secondary | ICD-10-CM | POA: Diagnosis not present

## 2015-08-31 LAB — ACETAMINOPHEN LEVEL

## 2015-08-31 LAB — COMPREHENSIVE METABOLIC PANEL
ALK PHOS: 63 U/L (ref 38–126)
ALT: 12 U/L — AB (ref 17–63)
ANION GAP: 11 (ref 5–15)
AST: 20 U/L (ref 15–41)
Albumin: 4.5 g/dL (ref 3.5–5.0)
BILIRUBIN TOTAL: 0.5 mg/dL (ref 0.3–1.2)
BUN: 13 mg/dL (ref 6–20)
CO2: 27 mmol/L (ref 22–32)
CREATININE: 1.14 mg/dL (ref 0.61–1.24)
Calcium: 9.1 mg/dL (ref 8.9–10.3)
Chloride: 110 mmol/L (ref 101–111)
GFR calc Af Amer: >60 mL/min (ref >60)
Glucose, Bld: 108 mg/dL — ABNORMAL HIGH (ref 65–99)
Potassium: 4.1 mmol/L (ref 3.5–5.1)
SODIUM: 148 mmol/L — AB (ref 135–145)
TOTAL PROTEIN: 8 g/dL (ref 6.5–8.1)

## 2015-08-31 LAB — CBC
HCT: 45.2 % (ref 39.0–52.0)
HEMOGLOBIN: 16 g/dL (ref 13.0–17.0)
MCH: 32.9 pg (ref 26.0–34.0)
MCHC: 35.4 g/dL (ref 30.0–36.0)
MCV: 93 fL (ref 78.0–100.0)
Platelets: 222 10*3/uL (ref 150–400)
RBC: 4.86 MIL/uL (ref 4.22–5.81)
RDW: 15 % (ref 11.5–15.5)
WBC: 6.6 10*3/uL (ref 4.0–10.5)

## 2015-08-31 LAB — RAPID URINE DRUG SCREEN, HOSP PERFORMED
AMPHETAMINES: NOT DETECTED
Barbiturates: NOT DETECTED
Benzodiazepines: NOT DETECTED
COCAINE: POSITIVE — AB
Opiates: NOT DETECTED
TETRAHYDROCANNABINOL: NOT DETECTED

## 2015-08-31 LAB — ETHANOL: ALCOHOL ETHYL (B): 231 mg/dL — AB (ref <5)

## 2015-08-31 LAB — SALICYLATE LEVEL: Salicylate Lvl: <4 mg/dL (ref 2.8–30.0)

## 2015-08-31 MED ORDER — ESCITALOPRAM OXALATE 10 MG PO TABS
20.0000 mg | ORAL_TABLET | Freq: Every day | ORAL | Status: DC
  Administered 2015-08-31 – 2015-09-02 (×3): 20 mg via ORAL
  Filled 2015-08-31 (×3): qty 2

## 2015-08-31 MED ORDER — NICOTINE 21 MG/24HR TD PT24
21.0000 mg | MEDICATED_PATCH | Freq: Every day | TRANSDERMAL | Status: DC
  Administered 2015-08-31 – 2015-09-01 (×2): 21 mg via TRANSDERMAL
  Filled 2015-08-31 (×2): qty 1

## 2015-08-31 MED ORDER — VITAMIN B-1 100 MG PO TABS
100.0000 mg | ORAL_TABLET | Freq: Every day | ORAL | Status: DC
  Administered 2015-08-31 – 2015-09-02 (×3): 100 mg via ORAL
  Filled 2015-08-31 (×3): qty 1

## 2015-08-31 MED ORDER — LORAZEPAM 1 MG PO TABS
0.0000 mg | ORAL_TABLET | Freq: Four times a day (QID) | ORAL | Status: DC
  Administered 2015-08-31 – 2015-09-01 (×2): 1 mg via ORAL
  Administered 2015-09-01: 2 mg via ORAL
  Administered 2015-09-01 (×2): 1 mg via ORAL
  Filled 2015-08-31 (×3): qty 1
  Filled 2015-08-31: qty 2
  Filled 2015-08-31 (×2): qty 1

## 2015-08-31 MED ORDER — ALUM & MAG HYDROXIDE-SIMETH 200-200-20 MG/5ML PO SUSP: 30.0000 mL | ORAL | Status: DC | PRN

## 2015-08-31 MED ORDER — PROPRANOLOL HCL 80 MG PO TABS
80.0000 mg | ORAL_TABLET | Freq: Two times a day (BID) | ORAL | Status: DC
  Administered 2015-08-31 – 2015-09-02 (×4): 80 mg via ORAL
  Filled 2015-08-31 (×5): qty 1

## 2015-08-31 MED ORDER — ACETAMINOPHEN 325 MG PO TABS: 650.0000 mg | ORAL_TABLET | ORAL | Status: DC | PRN

## 2015-08-31 MED ORDER — IBUPROFEN 200 MG PO TABS: 600.0000 mg | ORAL_TABLET | Freq: Three times a day (TID) | ORAL | Status: DC | PRN

## 2015-08-31 MED ORDER — PANTOPRAZOLE SODIUM 40 MG PO TBEC
40.0000 mg | DELAYED_RELEASE_TABLET | Freq: Every day | ORAL | Status: DC
  Administered 2015-08-31 – 2015-09-02 (×3): 40 mg via ORAL
  Filled 2015-08-31 (×3): qty 1

## 2015-08-31 MED ORDER — GABAPENTIN 300 MG PO CAPS: 300.0000 mg | ORAL_CAPSULE | Freq: Two times a day (BID) | ORAL | Status: DC | PRN

## 2015-08-31 MED ORDER — PRIMIDONE 50 MG PO TABS: 50.0000 mg | ORAL_TABLET | Freq: Every day | ORAL | Status: DC

## 2015-08-31 MED ORDER — TRAZODONE HCL 50 MG PO TABS
50.0000 mg | ORAL_TABLET | Freq: Every day | ORAL | Status: DC
  Administered 2015-08-31 – 2015-09-01 (×2): 50 mg via ORAL
  Filled 2015-08-31 (×2): qty 1

## 2015-08-31 MED ORDER — PRAVASTATIN SODIUM 40 MG PO TABS
40.0000 mg | ORAL_TABLET | Freq: Every day | ORAL | Status: DC
  Administered 2015-09-01: 40 mg via ORAL
  Filled 2015-08-31 (×2): qty 1

## 2015-08-31 MED ORDER — ONDANSETRON HCL 4 MG PO TABS: 4.0000 mg | ORAL_TABLET | Freq: Three times a day (TID) | ORAL | Status: DC | PRN

## 2015-08-31 MED ORDER — LORAZEPAM 1 MG PO TABS: 0.0000 mg | ORAL_TABLET | Freq: Two times a day (BID) | ORAL | Status: DC

## 2015-08-31 MED ORDER — FLUTICASONE PROPIONATE 50 MCG/ACT NA SUSP
2.0000 | Freq: Every day | NASAL | Status: DC | PRN
  Filled 2015-08-31: qty 16

## 2015-08-31 MED ORDER — ZOLPIDEM TARTRATE 5 MG PO TABS: 5.0000 mg | ORAL_TABLET | Freq: Every evening | ORAL | Status: DC | PRN

## 2015-08-31 MED ORDER — THIAMINE HCL 100 MG/ML IJ SOLN: 100.0000 mg | Freq: Every day | INTRAMUSCULAR | Status: DC

## 2015-08-31 MED ORDER — LEDIPASVIR-SOFOSBUVIR 90-400 MG PO TABS: 1.0000 | ORAL_TABLET | Freq: Every day | ORAL | Status: DC

## 2015-08-31 NOTE — ED Notes (Signed)
Lab draw completed.

## 2015-08-31 NOTE — ED Notes (Signed)
Pt reports intermittent SI. Pt has depression that is worsened by living by himself. Sometimes has thoughts of hurting others, but feels he is incapable. Has not seen MD in sometime. Has used alcohol and cocaine for pain and depression. Last drink etoh this am. Last use of cocaine this am as well.

## 2015-08-31 NOTE — ED Notes (Signed)
Per EMS. Pt told dispatch he had a plan for suicide attempt. When EMS arrived at pt's home, pt refused to open door, had to get maintenance to open door. Pt told EMS he was upset he did not have his weapons and that he was tired. Would not directly answer that he was suicidal with EMS. Pt had labile personality with EMS, and would go from laughing to posturing violence.

## 2015-08-31 NOTE — ED Notes (Addendum)
[facility] and denies HI and AVH at this time. Plan of care discussed with patient voices no complaints or concerns at this time. Encouragement and support provided and safety maintain. Q 15 min safety checks remain in place.

## 2015-08-31 NOTE — ED Notes (Signed)
Patient arrive to unit via ambulatory escorted by PCT.

## 2015-08-31 NOTE — ED Provider Notes (Signed)
CSN: [id number]     Arrival date & time 08/31/15  1650 History   First MD Initiated Contact with Patient 08/31/15 1730     Chief Complaint  Patient presents with  . Suicidal     (Consider location/radiation/quality/duration/timing/severity/associated sxs/prior Treatment) The history is provided by the patient and medical records.    65 year old male with history of depression, GERD, peripheral neuropathy, arthritis, hepatitis C, hyperlipidemia, hypertension, presenting to the ED for suicidal thoughts.  Patient called EMS because he has been having suicidal thoughts. He initially would not open the door and barricaded himself inside.  He states he lives alone and this allows him to much time to think. He states he has plans in his head of how he would do this, does not disclose them to me.  He states he is not sure if he would actually harm himself but states "you never know what you might do if you get to that point". He states he often has thoughts of hurting others as well, none today. He denies any arch or visual hallucinations. Patient uses alcohol daily, reports at least a fifth of liquor plus a few beers or glasses of wine. He also uses crack cocaine when he has "extra paper".  He states he did drink today and used cocaine earlier today. Patient denies having a psychiatrist that he sees regularly.  Denies any chest pain, SOB, abdominal pain, nausea, vomiting, diarrhea.   Past Medical History  Diagnosis Date  . Depression   . GERD (gastroesophageal reflux disease)   . Complication of anesthesia     slow to wake up after last surgery  . Peripheral neuropathy (HCC)     feet and hands  . Arthritis   . Hepatitis     hep c  . High cholesterol   . High blood pressure   . Neuropathy [facility])    Past Surgical History  Procedure Laterality Date  . Tonsillectomy    . Direct laryngoscopy  12/26/2011    Procedure: DIRECT LARYNGOSCOPY;  Surgeon: Melvenia Beam, MD;  Location: [facility] OR;  Service:  ENT;  Laterality: N/A;  Directy Laryngoscopy with biopsy [phone]  . Direct laryngoscopy N/A 10/21/2012    Procedure: DIRECT LARYNGOSCOPY;  Surgeon: Melvenia Beam, MD;  Location: [facility] OR;  Service: ENT;  Laterality: N/A;  . Esophagogastroduodenoscopy N/A 10/25/2012    Procedure: ESOPHAGOGASTRODUODENOSCOPY (EGD);  Surgeon: Charna Elizabeth, MD;  Location: [facility] ENDOSCOPY;  Service: Endoscopy;  Laterality: N/A;   Family History  Problem Relation Age of Onset  . Hypertension Mother   . Diverticulosis Mother   . GER disease Mother   . Lung disease Sister   . Polymyositis Sister    Social History  Substance Use Topics  . Smoking status: Current Some Day Smoker -- 0.25 packs/day for 45 years    Types: Cigarettes    Start date: 06/09/2012  . Smokeless tobacco: None     Comment: quitting, using e-cig  . Alcohol Use: 0.0 oz/week    0 Standard drinks or equivalent per week     Comment: Relas[employer] - currently in [facility] for counseling/rehab. last drink 06/29/14    Review of Systems  Psychiatric/Behavioral: Positive for suicidal ideas.  All other systems reviewed and are negative.     Allergies  Review of patient's allergies indicates no known allergies.  Home Medications   Prior to Admission medications   Medication Sig Start Date End Date Taking? Authorizing Provider  Ascorbic Acid (VITAMIN C) 1000 MG tablet Take 1,000  mg by mouth daily.   Yes Historical Provider, MD  escitalopram (LEXAPRO) 20 MG tablet Take 20 mg by mouth daily.   Yes Historical Provider, MD  fluticasone (FLONASE) 50 MCG/ACT nasal spray Place 2 sprays into the nose daily as needed for allergies.    Yes Historical Provider, MD  gabapentin (NEURONTIN) 300 MG capsule Take one capsule by mouth twice daily for pains Patient taking differently: Take 300 mg by mouth 2 (two) times daily as needed (feet pain.). Take one capsule by mouth twice daily for pains 09/12/14  Yes Sharon Seller, NP  lovastatin (MEVACOR) 20 MG tablet Take 1 tablet (20  mg total) by mouth at bedtime. For High Cholesterol 09/12/14  Yes Sharon Seller, NP  omeprazole (PRILOSEC) 40 MG capsule Take one tablet by mouth once daily for stomach 09/12/14  Yes Sharon Seller, NP  primidone (MYSOLINE) 50 MG tablet Take 1 tablet (50 mg total) by mouth at bedtime. 09/12/14  Yes Sharon Seller, NP  propranolol (INDERAL) 80 MG tablet Take 1 tablet (80 mg total) by mouth 2 (two) times daily. 09/12/14  Yes Sharon Seller, NP  traZODone (DESYREL) 50 MG tablet Take 50 mg by mouth at bedtime. 07/04/14  Yes Historical Provider, MD  folic acid (FOLVITE) 1 MG tablet Take 1 tablet (1 mg total) by mouth daily. Patient not taking: Reported on 08/31/2015 04/05/14   Oneal Grout, MD  Ledipasvir-Sofosbuvir (HARVONI) 90-400 MG TABS Take 1 tablet by mouth daily. 08/09/14   Gardiner Barefoot, MD  neomycin-polymyxin-hydrocortisone (CORTISPORIN) otic solution 1- 2  Drops to the toe after soaking twice daily Patient not taking: Reported on 08/31/2015 07/25/14   Max T Hyatt, DPM  thiamine (VITAMIN B-1) 100 MG tablet Take 1 tablet (100 mg total) by mouth daily. Patient not taking: Reported on 08/31/2015 04/05/14   Oneal Grout, MD   BP 132/88 mmHg  Pulse 77  Temp(Src) 98.1 F (36.7 C) (Oral)  Resp 18  SpO2 95%   Physical Exam  Constitutional: He is oriented to person, place, and time. He appears well-developed and well-nourished. No distress.  Appears intoxicated  HENT:  Head: Normocephalic and atraumatic.  Mouth/Throat: Oropharynx is clear and moist.  Eyes: Conjunctivae and EOM are normal. Pupils are equal, round, and reactive to light.  Neck: Normal range of motion. Neck supple.  Cardiovascular: Normal rate, regular rhythm and normal heart sounds.   Pulmonary/Chest: Effort normal and breath sounds normal. No respiratory distress. He has no wheezes.  Abdominal: Soft. Bowel sounds are normal. There is no tenderness. There is no guarding.  Musculoskeletal: Normal range of motion. He exhibits  no edema.  Neurological: He is alert and oriented to person, place, and time.  Skin: Skin is warm and dry. He is not diaphoretic.  Psychiatric: He has a normal mood and affect. He expresses homicidal and suicidal ideation. He expresses suicidal plans (unknown plan). He expresses no homicidal plans.  Nursing note and vitals reviewed.   ED Course  Procedures (including critical care time) Labs Review Labs Reviewed  COMPREHENSIVE METABOLIC PANEL - Abnormal; Notable for the following:    Sodium 148 (*)    Glucose, Bld 108 (*)    ALT 12 (*)    All other components within normal limits  ETHANOL - Abnormal; Notable for the following:    Alcohol, Ethyl (B) 231 (*)    All other components within normal limits  ACETAMINOPHEN LEVEL - Abnormal; Notable for the following:    Acetaminophen (Tylenol),  Serum <10 (*)    All other components within normal limits  URINE RAPID DRUG SCREEN, HOSP PERFORMED - Abnormal; Notable for the following:    Cocaine POSITIVE (*)    All other components within normal limits  SALICYLATE LEVEL  CBC  HCV RNA QUANT    Imaging Review No results found. I have personally reviewed and evaluated these images and lab results as part of my medical decision-making.   EKG Interpretation None      MDM   Final diagnoses:  Alcohol abuse  Cocaine use  Suicidal ideation   65 y.o. M here with suicidal ideation.  Has plan but does not disclose this to me.  Endorses passive HI without plan, denies this today.  No hallucinations.  Patient does appear intoxicated, admits to EtOH and cocaine today.  No physical complaints, specifically no chest pain, SOB, abdominal pain, N/V/D.  Labs reviewed-- overall reassuring, Na+ minimally elevated at 148.  Ethanol 231.  Patient medically cleared, will need TTS eval.  8:01 PM Received call from pharmacy-- [facility], recommend to discontinue patient's harvoni because he last took this medication in 2016 and never  followed up in clinic.  Does want hcv quant to follow-up on in clinic.  meds discontinued, additional labs ordered.  TTS has evaluated patient-- recommend monitor in ED overnight, reassess by psych in morning.  Garlon HatchetLisa M Kasee Hantz, PA-C 08/31/15 2330  Lorre NickAnthony Allen, MD 08/31/15 [phone]
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
## 2015-09-01 DIAGNOSIS — F1410 Cocaine abuse, uncomplicated: Secondary | ICD-10-CM | POA: Diagnosis present

## 2015-09-01 DIAGNOSIS — F1094 Alcohol use, unspecified with alcohol-induced mood disorder: Secondary | ICD-10-CM | POA: Diagnosis not present

## 2015-09-01 DIAGNOSIS — F1020 Alcohol dependence, uncomplicated: Secondary | ICD-10-CM | POA: Diagnosis present

## 2015-09-01 MED ORDER — LORAZEPAM 1 MG PO TABS
2.0000 mg | ORAL_TABLET | Freq: Once | ORAL | Status: AC
  Administered 2015-09-01: 2 mg via ORAL
  Filled 2015-09-01: qty 2

## 2015-09-01 NOTE — ED Notes (Signed)
Pt stayed in bed all day sleeping. He would wake up when called or touched, and took his medication. He has hand tremors. Denies SI/HI, does not appear to be delusional or have AVH.
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
## 2015-09-01 NOTE — Progress Notes (Signed)
Disposition CSW completed patient referrals for the following inpatient Geropsychiatry facilities:  [facility]   CSW will continue to follow patient for placement needs.  Seward SpeckLeo Shawna Kiener [facility] FriscoCSW,LCAS Behavioral Health Disposition CSW [phone]

## 2015-09-02 ENCOUNTER — Inpatient Hospital Stay (HOSPITAL_COMMUNITY)
Admission: AD | Admit: 2015-09-02 | Discharge: 2015-09-13 | DRG: 885 | Disposition: A | Discharge status: Home / Self Care | Payer: Commercial Managed Care - HMO | Source: Intra-hospital | Attending: Psychiatry | Admitting: Psychiatry

## 2015-09-02 ENCOUNTER — Encounter (HOSPITAL_COMMUNITY): Payer: Self-pay | Admitting: *Deleted

## 2015-09-02 DIAGNOSIS — G4700 Insomnia, unspecified: Secondary | ICD-10-CM | POA: Diagnosis present

## 2015-09-02 DIAGNOSIS — K219 Gastro-esophageal reflux disease without esophagitis: Secondary | ICD-10-CM | POA: Diagnosis present

## 2015-09-02 DIAGNOSIS — Z23 Encounter for immunization: Secondary | ICD-10-CM | POA: Diagnosis not present

## 2015-09-02 DIAGNOSIS — F419 Anxiety disorder, unspecified: Secondary | ICD-10-CM | POA: Diagnosis present

## 2015-09-02 DIAGNOSIS — Z8249 Family history of ischemic heart disease and other diseases of the circulatory system: Secondary | ICD-10-CM

## 2015-09-02 DIAGNOSIS — F17210 Nicotine dependence, cigarettes, uncomplicated: Secondary | ICD-10-CM | POA: Diagnosis present

## 2015-09-02 DIAGNOSIS — F1020 Alcohol dependence, uncomplicated: Secondary | ICD-10-CM | POA: Diagnosis not present

## 2015-09-02 DIAGNOSIS — F329 Major depressive disorder, single episode, unspecified: Secondary | ICD-10-CM | POA: Diagnosis not present

## 2015-09-02 DIAGNOSIS — G629 Polyneuropathy, unspecified: Secondary | ICD-10-CM | POA: Diagnosis present

## 2015-09-02 DIAGNOSIS — F1024 Alcohol dependence with alcohol-induced mood disorder: Secondary | ICD-10-CM | POA: Diagnosis present

## 2015-09-02 DIAGNOSIS — F1410 Cocaine abuse, uncomplicated: Secondary | ICD-10-CM | POA: Diagnosis present

## 2015-09-02 DIAGNOSIS — R45851 Suicidal ideations: Secondary | ICD-10-CM | POA: Diagnosis present

## 2015-09-02 DIAGNOSIS — B182 Chronic viral hepatitis C: Secondary | ICD-10-CM | POA: Diagnosis present

## 2015-09-02 DIAGNOSIS — E785 Hyperlipidemia, unspecified: Secondary | ICD-10-CM | POA: Diagnosis present

## 2015-09-02 DIAGNOSIS — Z79899 Other long term (current) drug therapy: Secondary | ICD-10-CM

## 2015-09-02 DIAGNOSIS — I10 Essential (primary) hypertension: Secondary | ICD-10-CM | POA: Diagnosis present

## 2015-09-02 DIAGNOSIS — E7800 Pure hypercholesterolemia, unspecified: Secondary | ICD-10-CM | POA: Diagnosis present

## 2015-09-02 DIAGNOSIS — F332 Major depressive disorder, recurrent severe without psychotic features: Secondary | ICD-10-CM | POA: Diagnosis present

## 2015-09-02 DIAGNOSIS — F1094 Alcohol use, unspecified with alcohol-induced mood disorder: Secondary | ICD-10-CM | POA: Diagnosis present

## 2015-09-02 DIAGNOSIS — R45850 Homicidal ideations: Secondary | ICD-10-CM | POA: Diagnosis not present

## 2015-09-02 MED ORDER — GABAPENTIN 300 MG PO CAPS
300.0000 mg | ORAL_CAPSULE | Freq: Two times a day (BID) | ORAL | Status: DC | PRN
  Administered 2015-09-03: 300 mg via ORAL
  Filled 2015-09-02: qty 1

## 2015-09-02 MED ORDER — MAGNESIUM HYDROXIDE 400 MG/5ML PO SUSP: 30.0000 mL | Freq: Every day | ORAL | Status: DC | PRN

## 2015-09-02 MED ORDER — ALUM & MAG HYDROXIDE-SIMETH 200-200-20 MG/5ML PO SUSP: 30.0000 mL | ORAL | Status: DC | PRN

## 2015-09-02 MED ORDER — PANTOPRAZOLE SODIUM 40 MG PO TBEC
40.0000 mg | DELAYED_RELEASE_TABLET | Freq: Every day | ORAL | Status: DC
  Administered 2015-09-03 – 2015-09-13 (×11): 40 mg via ORAL
  Filled 2015-09-02 (×13): qty 1

## 2015-09-02 MED ORDER — ZOLPIDEM TARTRATE 5 MG PO TABS
5.0000 mg | ORAL_TABLET | Freq: Every evening | ORAL | Status: DC | PRN
  Administered 2015-09-03 – 2015-09-12 (×9): 5 mg via ORAL
  Filled 2015-09-02 (×9): qty 1

## 2015-09-02 MED ORDER — VITAMIN B-1 100 MG PO TABS
100.0000 mg | ORAL_TABLET | Freq: Every day | ORAL | Status: DC
  Administered 2015-09-03: 100 mg via ORAL
  Filled 2015-09-02 (×2): qty 1

## 2015-09-02 MED ORDER — NICOTINE 21 MG/24HR TD PT24
21.0000 mg | MEDICATED_PATCH | Freq: Every day | TRANSDERMAL | Status: DC
  Administered 2015-09-03 – 2015-09-13 (×11): 21 mg via TRANSDERMAL
  Filled 2015-09-02 (×13): qty 1

## 2015-09-02 MED ORDER — IBUPROFEN 600 MG PO TABS
600.0000 mg | ORAL_TABLET | Freq: Three times a day (TID) | ORAL | Status: DC | PRN
  Administered 2015-09-02 – 2015-09-13 (×7): 600 mg via ORAL
  Filled 2015-09-02 (×7): qty 1

## 2015-09-02 MED ORDER — PROPRANOLOL HCL 80 MG PO TABS
80.0000 mg | ORAL_TABLET | Freq: Two times a day (BID) | ORAL | Status: DC
  Administered 2015-09-02 – 2015-09-13 (×21): 80 mg via ORAL
  Filled 2015-09-02 (×5): qty 1
  Filled 2015-09-02: qty 8
  Filled 2015-09-02 (×11): qty 1
  Filled 2015-09-02: qty 8
  Filled 2015-09-02 (×9): qty 1

## 2015-09-02 MED ORDER — THIAMINE HCL 100 MG/ML IJ SOLN: 100.0000 mg | Freq: Every day | INTRAMUSCULAR | Status: DC

## 2015-09-02 MED ORDER — LORAZEPAM 1 MG PO TABS
0.0000 mg | ORAL_TABLET | Freq: Two times a day (BID) | ORAL | Status: DC
  Administered 2015-09-02: 1 mg via ORAL
  Filled 2015-09-02: qty 1

## 2015-09-02 MED ORDER — ENSURE ENLIVE PO LIQD
237.0000 mL | Freq: Two times a day (BID) | ORAL | Status: DC
  Administered 2015-09-02 – 2015-09-13 (×22): 237 mL via ORAL

## 2015-09-02 MED ORDER — ACETAMINOPHEN 325 MG PO TABS: 650.0000 mg | ORAL_TABLET | ORAL | Status: DC | PRN

## 2015-09-02 MED ORDER — ESCITALOPRAM OXALATE 10 MG PO TABS
10.0000 mg | ORAL_TABLET | Freq: Every day | ORAL | Status: DC
  Administered 2015-09-03 – 2015-09-08 (×6): 10 mg via ORAL
  Filled 2015-09-02 (×7): qty 1

## 2015-09-02 MED ORDER — TRAZODONE HCL 50 MG PO TABS
50.0000 mg | ORAL_TABLET | Freq: Every day | ORAL | Status: DC
  Administered 2015-09-02 – 2015-09-08 (×7): 50 mg via ORAL
  Filled 2015-09-02 (×12): qty 1

## 2015-09-02 MED ORDER — LORAZEPAM 1 MG PO TABS
0.0000 mg | ORAL_TABLET | Freq: Four times a day (QID) | ORAL | Status: AC
  Administered 2015-09-02: 1 mg via ORAL
  Filled 2015-09-02: qty 1

## 2015-09-02 MED ORDER — ONDANSETRON HCL 4 MG PO TABS: 4.0000 mg | ORAL_TABLET | Freq: Three times a day (TID) | ORAL | Status: DC | PRN

## 2015-09-02 MED ORDER — ESCITALOPRAM OXALATE 10 MG PO TABS: 20.0000 mg | ORAL_TABLET | Freq: Every day | ORAL | Status: DC

## 2015-09-02 MED ORDER — ACETAMINOPHEN 325 MG PO TABS: 650.0000 mg | ORAL_TABLET | Freq: Four times a day (QID) | ORAL | Status: DC | PRN

## 2015-09-02 MED ORDER — PRAVASTATIN SODIUM 40 MG PO TABS
40.0000 mg | ORAL_TABLET | Freq: Every day | ORAL | Status: DC
  Administered 2015-09-02 – 2015-09-12 (×11): 40 mg via ORAL
  Filled 2015-09-02 (×17): qty 1

## 2015-09-02 MED ORDER — FLUTICASONE PROPIONATE 50 MCG/ACT NA SUSP
2.0000 | Freq: Every day | NASAL | Status: DC | PRN
  Filled 2015-09-02: qty 16

## 2015-09-02 MED ORDER — PNEUMOCOCCAL VAC POLYVALENT 25 MCG/0.5ML IJ INJ: 0.5000 mL | INJECTION | INTRAMUSCULAR | Status: DC

## 2015-09-02 NOTE — [facility] Counselor (Signed)
[facility], waiting fax machine for next attempt. Demiya Magno K. Sherlon HandingHarris, LCAS-A, LPC-A, [facility]  Counselor 09/02/2015 6:59 PM

## 2015-09-02 NOTE — ED Notes (Signed)
Pt discharged ambulatory with [facility] driver.  All belongings were returned to pt. 

## 2015-09-02 NOTE — [facility] Counselor (Signed)
Patient has been accepted to Observation bed 1 per [facility]Leo, [facility]. Patient reviewed and signed voluntary consent to treat and ROI to his mother. Patient denies any questions or concerns at this time. [facility] and provided to his nurse to transport with him. Psychiatrist informed of patients acceptance. Call report to [phone] and [facility] for transportation.   Davina PokeJoVea Tris Howell, LCSW Therapeutic Triage Specialist [facility] 09/02/2015 10:25 AM

## 2015-09-02 NOTE — H&P (Signed)
[facility] -Observation Unit H & P   Patient Identification: Thomas Mcneil MRN: [id number] Principal Diagnosis: Alcohol-induced mood disorder [facility]) Diagnosis:  Patient Active Problem List   Diagnosis Date Noted  . Alcohol dependence, uncomplicated ([place]) [I94.85] 09/01/2015    Priority: High  . Alcohol-induced mood disorder ([place]) [F10.94] 09/01/2015    Priority: High  . Cocaine abuse [F14.10] 09/01/2015    Priority: High  . Alcohol abuse [F10.10] 04/05/2014  . Essential tremor [G25.0] 01/04/2014  . Other and unspecified hyperlipidemia [E78.5] 01/04/2014  . B12 deficiency [E53.8] 01/04/2014  . Melena [K92.1] 10/23/2012  . GERD [K21.9] 05/27/2010  . SHOULDER PAIN, LEFT [M25.519] 05/21/2010  . ONYCHOMYCOSIS, TOENAILS [B35.1] 05/09/2010  . KNEE PAIN, BILATERAL [M25.569] 06/27/2009  . LEG CRAMPS [R25.2] 06/27/2009  . Chronic hepatitis C without hepatic coma ([place]) [B18.2] 09/05/2008  . CONTUSION OF UNSPECIFIED SITE [T14.8] 08/18/2008  . LEG EDEMA, BILATERAL [R60.9] 01/13/2008  . ERECTILE DYSFUNCTION [F52.8] 10/15/2007  . TOBACCO ABUSE [F17.200] 08/16/2007  . Mononeuritis of lower limb [G57.90] 08/16/2007  . CORNS AND CALLUSES [L84] 08/16/2007  . ALCOHOL ABUSE, HX OF [F10.21] 08/16/2007    Total Time spent with patient: 45 minutes  Subjective:  Thomas Mcneil is a 65 y.o. male patient admitted with depression and suicidal ideations, alcohol/cocaine abuse.Marland Kitchen  HPI: Thomas Mcneil is a 65 yo male who presented to the ED after calling 911 threatening suicide and then not allowing the police in his house. Patient told TTS that he has been feeling depressed and is not taking his psych meds, which he gets from [facility], because he has missed his appointments. Patient also reports that he has been drinking 1-3 fifths of wine/liquor daily for up to 2 1/2 years, along with weekly cocaine use. Patient reports he drinks enough  that "It's the first thing I do in the morning. I do it all day beer and wine. I have lost fifty pounds over the last two years." Patient continues to be vague about his suicidal ideation but is denying these thoughts during assessment today. Patient would not elaborate on a plan or intent when asked about SI by other clinicians. He denies having any access to weapons or having formulated a plan. Patient reports he gets the shakes when he stops drinking.   Today: Patient is calm but continues to endorse depression, history of depression. He is an outpatient at [facility] and take Lexapro and Trazodone. He reports using alcohol daily and cocaine when he can. Currently denies suicidal ideations but will monitor his withdrawal symptoms. Patient is interested in having a referral made to a residential program such as [facility] or [facility] due to his severe drug abuse. Nursing staff in Observation Unit noted that the patient is currently unsteady on his feet.   Past Psychiatric History: Alcohol and cocaine abuse  Risk to Self: Suicidal Ideation: Yes-Currently Present Suicidal Intent: (unknown) Is patient at risk for suicide?: Yes Suicidal Plan?: (unknown) What has been your use of drugs/alcohol within the last 12 months?: current significant use Intentional Self Injurious Behavior: None Risk to Others: Homicidal Ideation: Yes-Currently Present Thoughts of Harm to Others: Yes-Currently Present Comment - Thoughts of Harm to Others: pt reports he has been thinking of "homicide/suicide" Current Homicidal Intent: (unknown) Current Homicidal Plan: (unknown) Access to Homicidal Means: Yes Describe Access to Homicidal Means: has guns in home Identified Victim: none reported History of harm to others?: Yes Assessment of Violence: In distant past Violent Behavior Description: fights, "cut people" Does patient have access  to weapons?: Yes (Comment) Criminal Charges Pending?: No Does patient have a court  date: No Prior Inpatient Therapy: Prior Inpatient Therapy: Yes Prior Therapy Dates: 1990s [facility] Reason for [facility]: substance abuse residential several times Prior Outpatient Therapy: Prior Outpatient Therapy: Yes Prior Therapy Dates: recent Prior Therapy Facilty/Provider(s): [facility] Reason for Treatment: meds Does patient have an ACCT team?: No Does patient have Intensive In-House Services? : No Does patient have [facility] services? : Yes Does patient have P4CC services?: No  Past Medical History:  Past Medical History  Diagnosis Date  . Depression   . GERD (gastroesophageal reflux disease)   . Complication of anesthesia     slow to wake up after last surgery  . Peripheral neuropathy (HCC)     feet and hands  . Arthritis   . Hepatitis     hep c  . High cholesterol   . High blood pressure   . Neuropathy [facility])     Past Surgical History  Procedure Laterality Date  . Tonsillectomy    . Direct laryngoscopy  12/26/2011    Procedure: DIRECT LARYNGOSCOPY; Surgeon: Ruby Cola, MD; Location: [facility]; Service: ENT; Laterality: N/A; Directy Laryngoscopy with biopsy [phone]  . Direct laryngoscopy N/A 10/21/2012    Procedure: DIRECT LARYNGOSCOPY; Surgeon: Ruby Cola, MD; Location: [facility] OR; Service: ENT; Laterality: N/A;  . Esophagogastroduodenoscopy N/A 10/25/2012    Procedure: ESOPHAGOGASTRODUODENOSCOPY (EGD); Surgeon: Juanita Craver, MD; Location: [facility] ENDOSCOPY; Service: Endoscopy; Laterality: N/A;   Family History:  Family History  Problem Relation Age of Onset  . Hypertension Mother   . Diverticulosis Mother   . GER disease Mother   . Lung disease Sister   . Polymyositis Sister    Family Psychiatric History: None Social History:  History  Alcohol Use  . 0.0 oz/week  . 0 Standard drinks or equivalent per week    Comment: [facility] -  currently in [facility] for counseling/rehab. last drink 06/29/14    History  Drug Use  . Yes    Comment: "Crack" quit 06/29/14    Social History   Social History  . Marital Status: Widowed    Spouse Name: N/A  . Number of Children: N/A  . Years of Education: N/A   Social History Main Topics  . Smoking status: Current Some Day Smoker -- 0.25 packs/day for 45 years    Types: Cigarettes    Start date: 06/09/2012  . Smokeless tobacco: None     Comment: quitting, using e-cig  . Alcohol Use: 0.0 oz/week    0 Standard drinks or equivalent per week     Comment: [facility] - currently in [facility] for counseling/rehab. last drink 06/29/14  . Drug Use: Yes     Comment: "Crack" quit 06/29/14  . Sexual Activity: Not Asked   Other Topics Concern  . None   Social History Narrative   Additional Social History:    Allergies: No Known Allergies  Labs:   Lab Results Last 48 Hours    Results for orders placed or performed during the hospital encounter of 08/31/15 (from the past 48 hour(s))  Comprehensive metabolic panel Status: Abnormal   Collection Time: 08/31/15 5:28 PM  Result Value Ref Range   Sodium 148 (H) 135 - 145 mmol/L   Potassium 4.1 3.5 - 5.1 mmol/L   Chloride 110 101 - 111 mmol/L   CO2 27 22 - 32 mmol/L   Glucose, Bld 108 (H) 65 - 99 mg/dL   BUN 13 6 - 20 mg/dL  Creatinine, Ser 1.14 0.61 - 1.24 mg/dL   Calcium 9.1 8.9 - 10.3 mg/dL   Total Protein 8.0 6.5 - 8.1 g/dL   Albumin 4.5 3.5 - 5.0 g/dL   AST 20 15 - 41 U/L   ALT 12 (L) 17 - 63 U/L   Alkaline Phosphatase 63 38 - 126 U/L   Total Bilirubin 0.5 0.3 - 1.2 mg/dL   GFR calc non Af Amer >60 >60 mL/min   GFR calc Af Amer >60 >60 mL/min    Comment: (NOTE) The eGFR has been calculated using the CKD EPI equation. This calculation has not been validated in all clinical  situations. eGFR's persistently <60 mL/min signify possible Chronic Kidney Disease.    Anion gap 11 5 - 15  Ethanol (ETOH) Status: Abnormal   Collection Time: 08/31/15 5:28 PM  Result Value Ref Range   Alcohol, Ethyl (B) 231 (H) <5 mg/dL    Comment:   LOWEST DETECTABLE LIMIT FOR SERUM ALCOHOL IS 5 mg/dL FOR MEDICAL PURPOSES ONLY   Salicylate level Status: None   Collection Time: 08/31/15 5:28 PM  Result Value Ref Range   Salicylate Lvl <4.1 2.8 - 30.0 mg/dL  Acetaminophen level Status: Abnormal   Collection Time: 08/31/15 5:28 PM  Result Value Ref Range   Acetaminophen (Tylenol), Serum <10 (L) 10 - 30 ug/mL    Comment:   THERAPEUTIC CONCENTRATIONS VARY SIGNIFICANTLY. A RANGE OF 10-30 ug/mL MAY BE AN EFFECTIVE CONCENTRATION FOR MANY PATIENTS. HOWEVER, SOME ARE BEST TREATED AT CONCENTRATIONS OUTSIDE THIS RANGE. ACETAMINOPHEN CONCENTRATIONS >150 ug/mL AT 4 HOURS AFTER INGESTION AND >50 ug/mL AT 12 HOURS AFTER INGESTION ARE OFTEN ASSOCIATED WITH TOXIC REACTIONS.   CBC Status: None   Collection Time: 08/31/15 5:28 PM  Result Value Ref Range   WBC 6.6 4.0 - 10.5 K/uL   RBC 4.86 4.22 - 5.81 MIL/uL   Hemoglobin 16.0 13.0 - 17.0 g/dL   HCT 45.2 39.0 - 52.0 %   MCV 93.0 78.0 - 100.0 fL   MCH 32.9 26.0 - 34.0 pg   MCHC 35.4 30.0 - 36.0 g/dL   RDW 15.0 11.5 - 15.5 %   Platelets 222 150 - 400 K/uL  Urine rapid drug screen (hosp performed) (Not at [facility]) Status: Abnormal   Collection Time: 08/31/15 7:00 PM  Result Value Ref Range   Opiates NONE DETECTED NONE DETECTED   Cocaine POSITIVE (A) NONE DETECTED   Benzodiazepines NONE DETECTED NONE DETECTED   Amphetamines NONE DETECTED NONE DETECTED   Tetrahydrocannabinol NONE DETECTED NONE DETECTED   Barbiturates NONE DETECTED NONE DETECTED    Comment:   DRUG SCREEN FOR  MEDICAL PURPOSES ONLY. IF CONFIRMATION IS NEEDED FOR ANY PURPOSE, NOTIFY LAB WITHIN 5 DAYS.   LOWEST DETECTABLE LIMITS FOR URINE DRUG SCREEN Drug Class Cutoff (ng/mL) Amphetamine 1000 Barbiturate 200 Benzodiazepine 660 Tricyclics 630 Opiates 160 Cocaine 300 THC 50       Current Facility-Administered Medications  Medication Dose Route Frequency Provider Last Rate Last Dose  . acetaminophen (TYLENOL) tablet 650 mg 650 mg Oral Q4H PRN Larene Pickett, PA-C    . alum & mag hydroxide-simeth (MAALOX/MYLANTA) 200-200-20 MG/5ML suspension 30 mL 30 mL Oral PRN Larene Pickett, PA-C    . escitalopram (LEXAPRO) tablet 20 mg 20 mg Oral Daily Larene Pickett, PA-C  20 mg at 09/01/15 1054  . fluticasone (FLONASE) 50 MCG/ACT nasal spray 2 spray 2 spray Each Nare Daily PRN Larene Pickett, PA-C    . gabapentin (NEURONTIN) capsule  300 mg 300 mg Oral BID PRN Larene Pickett, PA-C    . ibuprofen (ADVIL,MOTRIN) tablet 600 mg 600 mg Oral Q8H PRN Larene Pickett, PA-C    . LORazepam (ATIVAN) tablet 0-4 mg 0-4 mg Oral 4 times per day Larene Pickett, PA-C  1 mg at 09/01/15 1300   Followed by  . [START ON 09/02/2015] LORazepam (ATIVAN) tablet 0-4 mg 0-4 mg Oral Q12H Larene Pickett, PA-C    . nicotine (NICODERM CQ - dosed in mg/24 hours) patch 21 mg 21 mg Transdermal Daily Larene Pickett, PA-C  21 mg at 09/01/15 1056  . ondansetron (ZOFRAN) tablet 4 mg 4 mg Oral Q8H PRN Larene Pickett, PA-C    . pantoprazole (PROTONIX) EC tablet 40 mg 40 mg Oral Daily Larene Pickett, PA-C  40 mg at 09/01/15 1100  . pravastatin (PRAVACHOL) tablet 40 mg 40 mg Oral q1800 Larene Pickett, PA-C    . propranolol (INDERAL) tablet 80 mg 80 mg Oral BID Larene Pickett, PA-C  80 mg at 09/01/15 1055  . thiamine (VITAMIN B-1) tablet  100 mg 100 mg Oral Daily Larene Pickett, PA-C  100 mg at 09/01/15 1054   Or  . thiamine (B-1) injection 100 mg 100 mg Intravenous Daily Larene Pickett, PA-C    . traZODone (DESYREL) tablet 50 mg 50 mg Oral QHS Larene Pickett, PA-C  50 mg at 08/31/15 2234  . zolpidem (AMBIEN) tablet 5 mg 5 mg Oral QHS PRN Larene Pickett, PA-C     Current Outpatient Prescriptions  Medication Sig Dispense Refill  . Ascorbic Acid (VITAMIN C) 1000 MG tablet Take 1,000 mg by mouth daily.    Marland Kitchen escitalopram (LEXAPRO) 20 MG tablet Take 20 mg by mouth daily.    . fluticasone (FLONASE) 50 MCG/ACT nasal spray Place 2 sprays into the nose daily as needed for allergies.     Marland Kitchen gabapentin (NEURONTIN) 300 MG capsule Take one capsule by mouth twice daily for pains (Patient taking differently: Take 300 mg by mouth 2 (two) times daily as needed (feet pain.). Take one capsule by mouth twice daily for pains) 180 capsule 3  . lovastatin (MEVACOR) 20 MG tablet Take 1 tablet (20 mg total) by mouth at bedtime. For High Cholesterol 90 tablet 3  . omeprazole (PRILOSEC) 40 MG capsule Take one tablet by mouth once daily for stomach 90 capsule 3  . primidone (MYSOLINE) 50 MG tablet Take 1 tablet (50 mg total) by mouth at bedtime. 90 tablet 3  . propranolol (INDERAL) 80 MG tablet Take 1 tablet (80 mg total) by mouth 2 (two) times daily. 180 tablet 3  . traZODone (DESYREL) 50 MG tablet Take 50 mg by mouth at bedtime.  0  . folic acid (FOLVITE) 1 MG tablet Take 1 tablet (1 mg total) by mouth daily. (Patient not taking: Reported on 08/31/2015) 30 tablet 6  . Ledipasvir-Sofosbuvir (HARVONI) 90-400 MG TABS Take 1 tablet by mouth daily. 28 tablet 2  . neomycin-polymyxin-hydrocortisone (CORTISPORIN) otic solution 1- 2 Drops to the toe after soaking twice daily (Patient not taking: Reported on 08/31/2015) 10 mL 1  . thiamine (VITAMIN B-1) 100  MG tablet Take 1 tablet (100 mg total) by mouth daily. (Patient not taking: Reported on 08/31/2015) 30 tablet 6    Musculoskeletal: Strength & Muscle Tone: within normal limits Gait & Station: unsteady Patient leans: N/A  Psychiatric Specialty Exam: Review of Systems  Constitutional: Negative.  HENT: Negative.  Eyes: Negative.  Respiratory: Negative.  Cardiovascular: Negative.  Gastrointestinal: Negative.  Genitourinary: Negative.  Musculoskeletal: Negative.  Skin: Negative.  Neurological: Negative.  Endo/Heme/Allergies: Negative.  Psychiatric/Behavioral: Positive for depression, suicidal ideas and substance abuse.    Blood pressure 120/79, pulse 75, temperature 98 F (36.7 C), temperature source Oral, resp. rate 18, SpO2 98 %.There is no weight on file to calculate BMI.  General Appearance: Casual  Eye Contact:: Good  Speech: Normal Rate  Volume: Normal  Mood: Anxious and Depressed  Affect: Congruent  Thought Process: Coherent  Orientation: Full (Time, Place, and Person)  Thought Content: WDL  Suicidal Thoughts: Passive SI with no plan  Homicidal Thoughts: No  Memory: Immediate; Fair Recent; Fair Remote; Fair  Judgement: Fair  Insight: Fair  Psychomotor Activity:Tremor   Concentration: Fair  Recall: [place] of Knowledge:Good  Language: Good  Akathisia: No  Handed: Right  AIMS (if indicated):    Assets: Housing Leisure Time Physical Health Resilience Social Support  ADL's: Intact  Cognition: WNL  Sleep:     Treatment Plan Summary: Daily contact with patient to assess and evaluate symptoms and progress in treatment, Medication management and Plan alcohol withdrawal and induced mood disorder:  -Crisis stabilization through admission to the [facility] -Medication management: CIWA protocol in place along with his Lexapro 10 mg daily for depression and Trazodone 50 mg at bedtime  for sleep -Individual and substance abuse counseling.  Disposition: Supportive therapy provided about ongoing stressors.  Elmarie Shiley NP-C 09/02/2015 3:52 PM I agree with plan.

## 2015-09-02 NOTE — ED Notes (Signed)
Patient denies SI, HI and AVH at this time. Patient calm and cooperative at this time. Encouragement and support provided and safety maintain. Q 15 min safety checks remain in place. 

## 2015-09-02 NOTE — ED Notes (Signed)
Patient remained in bed during night shift and slept. Patient only woke up when touch and took medications. Patient continues to have hand tremors but does not have any other signs of withdrawals symptoms. Q 15 min safety checks remain in place.

## 2015-09-02 NOTE — B[facility] Counselor (Signed)
Spoke with opt. With Vernona RiegerLaura, NP. Pt to be under observation overnight, and on CIWA protocol.  Pt. Tx. Discussed. Pt. states would like outpt.. This Clinical research associatewriter discussed briefly/ counseling on importance of inpt. Residential tx. Pt. Stated that he wakes up in the a.m. And drinks alcohol and has for at least 1.5 years. Pt.agreed to inpt. Residential tx. Possibility and signed ROI I[ note pt. Was experiencing excessive shake/ tremor]. Pt. [facility].  Pt does have Medicare, so other facilities may be needed if pt. Is still in need of detox. Follow-up needed if so due to Medicare acceptance for detox [pending observation and pt. Needs in a.m.]. However, residential should not be a problem. Note pt. Is a veteran, so [facility]. [facility] for [facility] since pt. Is a veteran and may be accepted under veteran care.  This writer was currently unable to obtain ROI for additional referrals due to pt. Current state and ability to sign/ consent.    Pt. states [facility] Physician on April 13th 2017. This Clinical research associatewriter spoke with pt. And recommended pt. Speak with [facility] for referral and additional resources for residential treatment. [facility] [place] [facility] [place] for substance abuse inpt.   Joziyah Roblero K. Sherlon HandingHarris, LCAS-A, LPC-A, [facility]  Counselor 09/02/2015 4:03 PM

## 2015-09-02 NOTE — Progress Notes (Signed)
Nursing Admission Note: Pt is a 65 year old African-American male admitted to OBS for SI and nonspecific HI (which he currently passively endorses but is able to contract for safety) and chronic substance abuse.  He lives alone and drinks at least 10 alcoholic beverages (beer, liquor) daily and reports that ADL's take much longer than normal due to his essential tremors..  He states that he also uses crack cocaine occasionally.  Pt reports weight loss of approximately 40 lbs over the last 6 months (Ensure protocol initiated).   A: Pt. Searched, and admitted to OBS without incident.  R: Safety maintained, will continue to monitor.

## 2015-09-03 DIAGNOSIS — R45851 Suicidal ideations: Secondary | ICD-10-CM | POA: Diagnosis present

## 2015-09-03 DIAGNOSIS — F1020 Alcohol dependence, uncomplicated: Secondary | ICD-10-CM | POA: Diagnosis not present

## 2015-09-03 DIAGNOSIS — G629 Polyneuropathy, unspecified: Secondary | ICD-10-CM | POA: Diagnosis present

## 2015-09-03 DIAGNOSIS — Z79899 Other long term (current) drug therapy: Secondary | ICD-10-CM | POA: Diagnosis not present

## 2015-09-03 DIAGNOSIS — B182 Chronic viral hepatitis C: Secondary | ICD-10-CM | POA: Diagnosis present

## 2015-09-03 DIAGNOSIS — G4700 Insomnia, unspecified: Secondary | ICD-10-CM | POA: Diagnosis present

## 2015-09-03 DIAGNOSIS — F1024 Alcohol dependence with alcohol-induced mood disorder: Secondary | ICD-10-CM | POA: Diagnosis present

## 2015-09-03 DIAGNOSIS — F1410 Cocaine abuse, uncomplicated: Secondary | ICD-10-CM | POA: Diagnosis present

## 2015-09-03 DIAGNOSIS — F332 Major depressive disorder, recurrent severe without psychotic features: Secondary | ICD-10-CM | POA: Diagnosis present

## 2015-09-03 DIAGNOSIS — E785 Hyperlipidemia, unspecified: Secondary | ICD-10-CM | POA: Diagnosis present

## 2015-09-03 DIAGNOSIS — I10 Essential (primary) hypertension: Secondary | ICD-10-CM | POA: Diagnosis present

## 2015-09-03 DIAGNOSIS — F419 Anxiety disorder, unspecified: Secondary | ICD-10-CM | POA: Diagnosis present

## 2015-09-03 DIAGNOSIS — E7800 Pure hypercholesterolemia, unspecified: Secondary | ICD-10-CM | POA: Diagnosis present

## 2015-09-03 DIAGNOSIS — Z23 Encounter for immunization: Secondary | ICD-10-CM | POA: Diagnosis not present

## 2015-09-03 DIAGNOSIS — F17210 Nicotine dependence, cigarettes, uncomplicated: Secondary | ICD-10-CM | POA: Diagnosis present

## 2015-09-03 DIAGNOSIS — Z8249 Family history of ischemic heart disease and other diseases of the circulatory system: Secondary | ICD-10-CM | POA: Diagnosis not present

## 2015-09-03 DIAGNOSIS — K219 Gastro-esophageal reflux disease without esophagitis: Secondary | ICD-10-CM | POA: Diagnosis present

## 2015-09-03 MED ORDER — VITAMIN B-1 100 MG PO TABS: 100.0000 mg | ORAL_TABLET | Freq: Every day | ORAL | Status: DC

## 2015-09-03 MED ORDER — ADULT MULTIVITAMIN W/MINERALS CH
1.0000 | ORAL_TABLET | Freq: Every day | ORAL | Status: DC
  Administered 2015-09-03 – 2015-09-13 (×11): 1 via ORAL
  Filled 2015-09-03 (×13): qty 1

## 2015-09-03 MED ORDER — ACETAMINOPHEN 325 MG PO TABS: 650.0000 mg | ORAL_TABLET | Freq: Four times a day (QID) | ORAL | Status: DC | PRN

## 2015-09-03 MED ORDER — LORAZEPAM 1 MG PO TABS: 1.0000 mg | ORAL_TABLET | Freq: Four times a day (QID) | ORAL | Status: DC | PRN

## 2015-09-03 MED ORDER — LORAZEPAM 1 MG PO TABS: 1.0000 mg | ORAL_TABLET | Freq: Every day | ORAL | Status: DC

## 2015-09-03 MED ORDER — ONDANSETRON 4 MG PO TBDP
4.0000 mg | ORAL_TABLET | Freq: Four times a day (QID) | ORAL | Status: AC | PRN
Start: 2015-09-03 — End: 2015-09-06

## 2015-09-03 MED ORDER — CHLORDIAZEPOXIDE HCL 25 MG PO CAPS
25.0000 mg | ORAL_CAPSULE | Freq: Three times a day (TID) | ORAL | Status: AC
  Administered 2015-09-05 (×3): 25 mg via ORAL
  Filled 2015-09-03 (×2): qty 1

## 2015-09-03 MED ORDER — HYDROXYZINE HCL 25 MG PO TABS
25.0000 mg | ORAL_TABLET | Freq: Four times a day (QID) | ORAL | Status: AC | PRN
  Filled 2015-09-03: qty 1

## 2015-09-03 MED ORDER — LOPERAMIDE HCL 2 MG PO CAPS: 2.0000 mg | ORAL_CAPSULE | ORAL | Status: DC | PRN

## 2015-09-03 MED ORDER — ADULT MULTIVITAMIN W/MINERALS CH: 1.0000 | ORAL_TABLET | Freq: Every day | ORAL | Status: DC

## 2015-09-03 MED ORDER — CHLORDIAZEPOXIDE HCL 25 MG PO CAPS
25.0000 mg | ORAL_CAPSULE | Freq: Four times a day (QID) | ORAL | Status: AC
  Administered 2015-09-03 – 2015-09-04 (×6): 25 mg via ORAL
  Filled 2015-09-03 (×6): qty 1

## 2015-09-03 MED ORDER — CHLORDIAZEPOXIDE HCL 25 MG PO CAPS
25.0000 mg | ORAL_CAPSULE | Freq: Once | ORAL | Status: AC
  Administered 2015-09-03: 25 mg via ORAL
  Filled 2015-09-03: qty 1

## 2015-09-03 MED ORDER — ONDANSETRON 4 MG PO TBDP: 4.0000 mg | ORAL_TABLET | Freq: Four times a day (QID) | ORAL | Status: AC | PRN

## 2015-09-03 MED ORDER — THIAMINE HCL 100 MG/ML IJ SOLN
100.0000 mg | Freq: Once | INTRAMUSCULAR | Status: AC
Start: 2015-09-03 — End: 2015-09-03
  Administered 2015-09-03: 100 mg via INTRAMUSCULAR
  Filled 2015-09-03: qty 2

## 2015-09-03 MED ORDER — LORAZEPAM 1 MG PO TABS
1.0000 mg | ORAL_TABLET | Freq: Two times a day (BID) | ORAL | Status: DC
  Administered 2015-09-03: 1 mg via ORAL
  Filled 2015-09-03: qty 1

## 2015-09-03 MED ORDER — MAGNESIUM HYDROXIDE 400 MG/5ML PO SUSP: 30.0000 mL | Freq: Every day | ORAL | Status: DC | PRN

## 2015-09-03 MED ORDER — CHLORDIAZEPOXIDE HCL 25 MG PO CAPS
25.0000 mg | ORAL_CAPSULE | Freq: Every day | ORAL | Status: AC
  Administered 2015-09-07: 25 mg via ORAL
  Filled 2015-09-03: qty 1

## 2015-09-03 MED ORDER — LOPERAMIDE HCL 2 MG PO CAPS: 2.0000 mg | ORAL_CAPSULE | ORAL | Status: AC | PRN

## 2015-09-03 MED ORDER — ALUM & MAG HYDROXIDE-SIMETH 200-200-20 MG/5ML PO SUSP: 30.0000 mL | ORAL | Status: DC | PRN

## 2015-09-03 MED ORDER — CHLORDIAZEPOXIDE HCL 25 MG PO CAPS
25.0000 mg | ORAL_CAPSULE | Freq: Four times a day (QID) | ORAL | Status: AC | PRN
  Administered 2015-09-05: 25 mg via ORAL
  Filled 2015-09-03: qty 1

## 2015-09-03 MED ORDER — HYDROXYZINE HCL 25 MG PO TABS
25.0000 mg | ORAL_TABLET | Freq: Four times a day (QID) | ORAL | Status: AC | PRN
  Administered 2015-09-03 – 2015-09-05 (×2): 25 mg via ORAL

## 2015-09-03 MED ORDER — THIAMINE HCL 100 MG/ML IJ SOLN: 100.0000 mg | Freq: Once | INTRAMUSCULAR | Status: DC

## 2015-09-03 MED ORDER — CHLORDIAZEPOXIDE HCL 25 MG PO CAPS
25.0000 mg | ORAL_CAPSULE | ORAL | Status: AC
  Administered 2015-09-06 (×2): 25 mg via ORAL
  Filled 2015-09-03 (×3): qty 1

## 2015-09-03 NOTE — Progress Notes (Signed)
Patient transferred from observation to inpatient adult unit voluntarily. Patient up with assist, using walker, unsteady gait. Patient oriented to unit/room.

## 2015-09-03 NOTE — Progress Notes (Signed)
  NUTRITION ASSESSMENT  Pt identified as at risk on the Malnutrition Screen Tool  INTERVENTION: 1. Educated patient on the importance of nutrition and encouraged intake of food and beverages. 2. Discussed weight goals. 3. Supplements: continue Ensure Enlive BID, each supplement provides 350 kcal and 20 grams of protein   NUTRITION DIAGNOSIS: Unintentional weight loss related to sub-optimal intake as evidenced by pt report.   Goal: Pt to meet >/= 90% of their estimated nutrition needs.  Monitor:  PO intake  Assessment:  Pt admitted with SI. Pt with hx of alcohol and cocaine abuse. Per review, pt has lost 28 lbs (14.5% body weight) in the past 24 days which is significant for time frame. Ensure Shiela Mayernlive has already been ordered BID.  65 y.o. male  Height: Ht Readings from Last 1 Encounters:  09/02/15 6\' 1"  (1.854 m)    Weight: Wt Readings from Last 1 Encounters:  09/02/15 165 lb (74.844 kg)    Weight Hx: Wt Readings from Last 10 Encounters:  09/02/15 165 lb (74.844 kg)  08/09/14 193 lb (87.544 kg)  07/25/14 202 lb (91.627 kg)  04/05/14 190 lb (86.183 kg)  01/04/14 202 lb (91.627 kg)  03/17/13 232 lb (105.235 kg)  10/25/12 200 lb (90.719 kg)  10/13/12 221 lb 4.8 oz (100.381 kg)  12/23/11 202 lb 1 oz (91.655 kg)  08/16/10 201 lb (91.173 kg)    BMI:  Body mass index is 21.77 kg/(m^2). Pt meets criteria for normal weight based on current BMI.  Estimated Nutritional Needs: Kcal: 25-30 kcal/kg Protein: > 1 gram protein/kg Fluid: 1 ml/kcal  Diet Order: Diet Heart Room service appropriate?: Yes; Fluid consistency:: Thin Pt is also offered choice of unit snacks mid-morning and mid-afternoon.  Pt is eating as desired.   Lab results and medications reviewed.     Trenton GammonJessica Arnesha Schiraldi, RD, LDN Inpatient Clinical Dietitian Pager # [phone] After hours/weekend pager # [phone]

## 2015-09-03 NOTE — Progress Notes (Signed)
Admit note:  Patient is a 65 [facility] depression/suicidal ideation/alcohol/cocaine abuse.  Patient presented to the ED after calling 911 threatening suicide.  When the police arrived, patient would not let them in his home.  [facility] and has missed appointments and is not taking his medications.  He has been drinking 1-3 fifths of wine/liquor daily for approximately 2 1/2 years.  He has also been using cocaine weekly.  He states he drinks first thing in the morning and continues to drink all day.  Patient states he does not have a specific plan and denies having access to firearms.  Patient is very tremulous and unsteady on his feet.  He is ambulating with walker/wheelchair.  Patient [facility] due to his severe abuse.  Patient has medical hx of essential tremor, B12 deficiency, chronic Hep C.  He denies HI/AVH.  He contracts for safety on the unit.  Patient was assessed and transferred from OBS to Adult unit.

## 2015-09-03 NOTE — Progress Notes (Signed)
Thomas AhmadiCarl Gwynn is a 65 yo male who presented to the ED after calling 911 threatening suicide and then not allowing the police in his house. Patient told TTS that he has been feeling depressed and is not taking his psych meds, which [facility], because he has missed his appointments. Patient also reports that he has been drinking 1-3 fifths of wine/liquor daily for up to 2 1/2 years, along with weekly cocaine use.[facility] and started on an ativan taper. [facility]. [name] continues to report withdrawal symptoms such as sweats and tremor due to his prolonged pattern of alcohol abuse over the last year. [facility] contacted staff to inform them that the patient would need detox for three days before he can be admitted to their facility. A male bed on the 300 hall was secured for the patient to safely continue his detox from alcohol. The patient has also been using cocaine at least twice weekly. Patient continues to have a depressed mood and has been restarted on his antidepressant.

## 2015-09-03 NOTE — Progress Notes (Signed)
1:1 note     Pt is very shakey and unsteady    He needs assistance with ambulation and eating    He has difficulty holding a cup to drink from and cannot grasp anything   He is a 1:1 for safety   Medications administered and effectiveness monitored    Pt safe at present

## 2015-09-03 NOTE — Progress Notes (Signed)
Patient complained of headache of 7/10. Sleeping on/off. Woke up only for meds and snacks time. Endorses depression 6/10. Denies active SI, AH/VH. Minimal conversation. No distress noted. Staff offered support and encouragement. Offered due medication. Constant monitoring and observation maintained for safety.  Patient remains safe.

## 2015-09-03 NOTE — Tx Team (Signed)
Initial Interdisciplinary Treatment Plan   PATIENT STRESSORS: Financial difficulties Health problems Medication change or noncompliance Substance abuse   PATIENT STRENGTHS: Average or above average intelligence Capable of independent living Communication skills Motivation for treatment/growth   PROBLEM LIST: Problem List/Patient Goals Date to be addressed Date deferred Reason deferred Estimated date of resolution  Alcohol Abuse 09/03/2015     Cocaine Abue 09/03/2015     Depression 09/03/2015     Suicidal Ideation 09/03/2015                                    DISCHARGE CRITERIA:  Improved stabilization in mood, thinking, and/or behavior Motivation to continue treatment in a less acute level of care Reduction of life-threatening or endangering symptoms to within safe limits Withdrawal symptoms are absent or subacute and managed without 24-hour nursing intervention  PRELIMINARY DISCHARGE PLAN: Attend 12-step recovery group Outpatient therapy Return to previous living arrangement  PATIENT/FAMIILY INVOLVEMENT: This treatment plan has been presented to and reviewed with the patient, Thomas Mcneil.  The patient and family have been given the opportunity to ask questions and make suggestions.  Cranford MonBeaudry, Deo Mehringer Evans 09/03/2015, 4:28 PM

## 2015-09-03 NOTE — Progress Notes (Signed)
Nursing Shift Note from 0820  Patient is sleeping but easily awakened, is cooperative with medication administration and currently denies any SI/HI/AVH, rates depression and anxiety at a 5 on a 1 to 10 scale, and agrees to contract for safety on the Unit. Nurse administering medications as ordered and providing emotional support and therapeutic commuication as well as ensuring continuous observation of patient for safety except when in the bathroom. Patient remains safe on Unit.

## 2015-09-03 NOTE — Progress Notes (Signed)
Pt attended AA meeting this evening.  

## 2015-09-04 DIAGNOSIS — F332 Major depressive disorder, recurrent severe without psychotic features: Principal | ICD-10-CM

## 2015-09-04 DIAGNOSIS — F1020 Alcohol dependence, uncomplicated: Secondary | ICD-10-CM | POA: Diagnosis present

## 2015-09-04 MED ORDER — NICOTINE POLACRILEX 2 MG MT GUM: 2.0000 mg | CHEWING_GUM | OROMUCOSAL | Status: DC | PRN

## 2015-09-04 MED ORDER — VITAMIN C 500 MG PO TABS
1000.0000 mg | ORAL_TABLET | Freq: Every day | ORAL | Status: DC
  Administered 2015-09-04 – 2015-09-13 (×10): 1000 mg via ORAL
  Filled 2015-09-04 (×12): qty 2

## 2015-09-04 MED ORDER — GABAPENTIN 300 MG PO CAPS
300.0000 mg | ORAL_CAPSULE | Freq: Three times a day (TID) | ORAL | Status: DC
  Administered 2015-09-04 – 2015-09-13 (×27): 300 mg via ORAL
  Filled 2015-09-04 (×32): qty 1

## 2015-09-04 NOTE — Progress Notes (Signed)
1:1  1800 Patient sitting up in bed.  Patient denied SI and HI, contracts for safety.  Denied A/V hallucinations.  Respirations even and unlabored.  No signs/symptoms of pain/distress noted on patient's face/body movements.  Patient very happy that he has neurotin at this time.  Safety maintained with 1:1 present for safety.

## 2015-09-04 NOTE — Tx Team (Signed)
Interdisciplinary Treatment Plan Update (Adult)  Date:  09/04/2015  Time Reviewed:  8:57 AM   Progress in Treatment: Attending groups: No. New to unit. Continuing to assess.  Participating in groups:  No. Taking medication as prescribed:  Yes. Tolerating medication:  Yes. Family/Significant othe contact made:  SPE required for this pt.  Patient understands diagnosis:  Yes. AEB seeking treatment for SI, depression, alcohol/cocaine abuse, and for medication stabilization. Discussing patient identified problems/goals with staff:  Yes. Medical problems stabilized or resolved:  Yes. Denies suicidal/homicidal ideation: Yes. Issues/concerns per patient self-inventory:  Other:  Discharge Plan or Barriers: CSW assessing for appropriate referrals. Pt states that he goes to [facility] for medication management but has not been going and has missed several appts.   Reason for Continuation of Hospitalization: Depression Medication stabilization Withdrawal symptoms  Comments:  Thomas Mcneil is an 65 y.o. male. Pt called 911 this morning and made reference to suicide. See previous notes: pt would not open door and behaved oddly, per EMS. Pt tells TTS that he has been feeling depressed and is not taking his psych meds, which he gets from [place], because he has missed his appointments. Pt also reports that he has been drinking 1-3 fifths of wine/liquor daily for up to 2 1/2 years, along with weekly cocaine use. Pt reports he drinks enough that "I don't feel anything." Pt initially denied SI but then said "if I was suicidal, I wouldn't tell you."Pt was agitated when asked about guns and said he had guns but then said they were not at his house. Pt did report he gets the shakes when he stops drinking. TTS spoke with pt's mother who was with pt at [facility]. She reported that pt called her this morning as he normally does but did not report anything unusual. Pt then called her later in the day and told her  that he was in the hospital. Pt has mentioned feeling depressed to his mother and she has noticed that when she sees him he usually smells of alcohol. Pt has not made any mention of SI or HI to her. Diagnosis: depression, alcohol use  Estimated length of stay:  3-5 days   New goal(s): to develop effective aftercare plan.   Additional Comments:  Patient and CSW reviewed pt's identified goals and treatment plan. Patient verbalized understanding and agreed to treatment plan. CSW reviewed [facility] "Discharge Process and Patient Involvement" Form. Pt verbalized understanding of information provided and signed form.    Review of initial/current patient goals per problem list:  1. Goal(s): Patient will participate in aftercare plan  Met: no.   Target date: at discharge  As evidenced by: Patient will participate within aftercare plan AEB aftercare provider and housing plan at discharge being identified.  3/28: CSW assessing for appropriate referrals.   2. Goal (s): Patient will exhibit decreased depressive symptoms and suicidal ideations.  Met: No.    Target date: at discharge  As evidenced by: Patient will utilize self rating of depression at 3 or below and demonstrate decreased signs of depression or be deemed stable for discharge by MD.  3/28: Pt rates depression as high but currently denies SI/HI/AVH.   3. Goal(s): Patient will demonstrate decreased signs of withdrawal due to substance abuse  Met:No.   Target date:at discharge   As evidenced by: Patient will produce a CIWA/COWS score of 0, have stable vitals signs, and no symptoms of withdrawal.  3/28: Pt reports moderate/severe withdrawals with latest CIWA score of 11  and stable vitals.   Attendees: Patient:   09/04/2015 8:57 AM   Family:   09/04/2015 8:57 AM   Physician:  Dr. Carlton Adam, MD 09/04/2015 8:57 AM   Nursing:   Parthenia Ames RN 09/04/2015 8:57 AM   Clinical Social Worker: Maxie Better, LCSW 09/04/2015 8:57 AM    Clinical Social Worker: Erasmo Downer Drinkard LCSWA; Peri Maris LCSWA 09/04/2015 8:57 AM   Other:  Gerline Legacy Nurse Case Manager 09/04/2015 8:57 AM   Other:  Agustina Caroli NP 09/04/2015 8:57 AM   Other:   09/04/2015 8:57 AM   Other:  09/04/2015 8:57 AM   Other:  09/04/2015 8:57 AM   Other:  09/04/2015 8:57 AM    09/04/2015 8:57 AM    09/04/2015 8:57 AM    09/04/2015 8:57 AM    09/04/2015 8:57 AM    Scribe for Treatment Team:   Maxie Better, LCSW 09/04/2015 8:57 AM

## 2015-09-04 NOTE — Progress Notes (Signed)
Pt did not attend AA meeting this evening.  

## 2015-09-04 NOTE — Progress Notes (Signed)
No change from previous assessment   pt remains unsteady and a high fall risk   Pt is on 1:1 precautions and remains safe

## 2015-09-04 NOTE — Progress Notes (Addendum)
1:1  1250  Patient ate 100% lunch.  Patient has been in bed with 1:1 present for safety.  Patient continues to deny SI and HI, contracts for safety.  Denied A/V hallucinations.  Respirations even and unlabored.  No signs/symptoms of pain/distress noted on patient's face/body movements.  1:1 continues for safety per MD order.  Patient's self inventory sheet, patient has poor sleep, sleep medication is not helpful.  Fair appetite, low energy level, poor concentration.  Rated depression and hopeless 8, denied anxiety.  Withdrawals, tremors, sedation.  Denied SI.  Physical problems, pain, worst pain in past 24 hours is #7, feet/legs/knees.  Goal is to feel better.  Plans to talk to MD.  No discharge plans. Patient will discuss medications with MD, would like to talk to MD about neurotin.  1525  Patient in bed sleeping with 1:1 present for safety.  Respirations even and unlabored.  No signs/symptoms of pain/distress noted on patient's face/body movements.   Informed MD that patient voided.  Safety maintained with 1:1 present.

## 2015-09-04 NOTE — Progress Notes (Signed)
Recreation Therapy Notes  Animal-Assisted Activity (AAA) Program Checklist/Progress Notes Patient Eligibility Criteria Checklist & Daily Group note for Rec Tx Intervention  Date: 03.28.2017 Time: 2:45pm [address]   AAA/T Program Assumption of Risk Form signed by Engineer, productionatient/ or Parent Legal Guardian Yes.    Patient is free of allergies or sever asthma Yes.    Patient reports no fear of animals Yes.    Patient reports no history of cruelty to animals Yes.    Patient understands his/her participation is voluntary Yes.    Behavioral Response: Did not attend.    Marykay Lexenise L Danisa Kopec, LRT/CTRS        Jearl KlinefelterBlanchfield, Shaindy Reader L 09/04/2015 3:16 PM

## 2015-09-04 NOTE — [facility] Group Notes (Signed)
The focus of this group is to educate the patient on the purpose and policies of crisis stabilization and provide a format to answer questions about their admission.  The group details unit policies and expectations of patients while admitted.  Patient did not attend 0900 nurse education orientation this morning.  Patient stayed in bed. 1:1 present with patient.

## 2015-09-04 NOTE — [facility] Counselor (Signed)
CSW attempted to meet with pt today to complete PsychoSocial Assessment and discuss aftercare. Pt remains sleeping in bed.   Trula SladeHeather Smart, MSW, LCSW Clinical Social Worker 09/04/2015 3:48 PM

## 2015-09-04 NOTE — Plan of Care (Signed)
Problem: Consults Goal: Depression Patient Education See Patient Education Module for education specifics.  Outcome: Progressing Nurse discussed depression/coping skills in patient.

## 2015-09-04 NOTE — H&P (Signed)
Psychiatric Admission Assessment Adult  Patient Identification: Thomas Mcneil  MRN:  [id number]  Date of Evaluation:  09/04/2015  Chief Complaint:  ALCOHOL INDUCED MOOD DISORDER  Principal Diagnosis: Alcohol-induced mood disorder (HCC)  Diagnosis:   Patient Active Problem List   Diagnosis Date Noted  . Alcohol dependence, uncomplicated (HCC) [F10.20] 09/01/2015  . Alcohol-induced mood disorder (HCC) [F10.94] 09/01/2015  . Cocaine abuse [F14.10] 09/01/2015  . Alcohol abuse [F10.10] 04/05/2014  . Essential tremor [G25.0] 01/04/2014  . Other and unspecified hyperlipidemia [E78.5] 01/04/2014  . B12 deficiency [E53.8] 01/04/2014  . Melena [K92.1] 10/23/2012  . GERD [K21.9] 05/27/2010  . SHOULDER PAIN, LEFT [M25.519] 05/21/2010  . ONYCHOMYCOSIS, TOENAILS [B35.1] 05/09/2010  . KNEE PAIN, BILATERAL [M25.569] 06/27/2009  . LEG CRAMPS [R25.2] 06/27/2009  . Chronic hepatitis C without hepatic coma (HCC) [B18.2] 09/05/2008  . CONTUSION OF UNSPECIFIED SITE [T14.8] 08/18/2008  . LEG EDEMA, BILATERAL [R60.9] 01/13/2008  . ERECTILE DYSFUNCTION [F52.8] 10/15/2007  . TOBACCO ABUSE [F17.200] 08/16/2007  . Mononeuritis of lower limb [G57.90] 08/16/2007  . CORNS AND CALLUSES [L84] 08/16/2007  . ALCOHOL ABUSE, HX OF [F10.21] 08/16/2007   History of Present Illness: This is an admission assessment for this 65 year old AAM. Thomas Mcneil is [facility] with complaints of increased alcohol consumption, cocaine use & suicidal ideations. Thomas Mcneil's blood alcohol level upon arrival to the ED was 231. During this assessment, he reports, "I called the ambulance on the 23rd day of March to take me to the hospital. I drank too much alcohol on this day. I have been drinking heavily x 50 years. The drinking worsened in the last 2 years. I drink because I like the taste of alcohol. I was drinking 2 of the 1/5th daily. I also use cocaine as well for 20 years. I have been depressed  for 6 years, not knowing why I'm so depressed. I take medicine for depression, only that it is not very effective. I have had suicidal thoughts off & on for many years. I have not actually attempted suicide. I do not think my depression medicine is helping me at this point. I'm a disable Veteran. Would like to go to residential treatment center after discharge".  Objective: Thomas Mcneil is alert, oriented x 3 & aware of situation. He is verbally responsive. He is lying down in his bed on a 1:1 supervision for safety. Thomas Mcneil appears weak & frail. He says his gait is unsteady due to peripheral neuropathy. He is currently using a walker or wheel chair to mobilize within the unit. He says he drinks a lot to stop himself from shaking. He reported poor appetite & a wt loss of 55 in 3 months. 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 He says he has appointment at the [facility] on 09-20-15. He would like to go to a residential treatment center after discharge. He prefers [facility].  Associated Signs/Symptoms:  Depression Symptoms:  depressed mood, insomnia, anxiety, weight loss,  (Hypo) Manic Symptoms:  Impulsivity,  Anxiety Symptoms:  Excessive Worry,  Psychotic Symptoms:  Denies any psychotic symptoms  PTSD Symptoms: Denies  Total Time spent with patient: 1 hour  Past Psychiatric History: Alcoholism, chronic  Is the patient at risk to self? Yes.    Has the patient been a risk to self in the past 6 months? No.  Has the patient been a risk to self within the distant past? No.  Is the patient a risk to  others? No.  Has the patient been a risk to others in the past 6 months? No.  Has the patient been a risk to others within the distant past? No.   [facility] Prior Outpatient Therapy: Yes [name]  Alcohol Screening: 1. How often do you have a drink containing alcohol?: 4 or more times a week 2. How many drinks containing alcohol do you have on a typical day when you are  drinking?: 10 or more 3. How often do you have six or more drinks on one occasion?: Daily or almost daily Preliminary Score: 8 4. How often during the last year have you found that you were not able to stop drinking once you had started?: Never 5. How often during the last year have you failed to do what was normally expected from you becasue of drinking?: Never 6. How often during the last year have you needed a first drink in the morning to get yourself going after a heavy drinking session?: Daily or almost daily 7. How often during the last year have you had a feeling of guilt of remorse after drinking?: Monthly 8. How often during the last year have you been unable to remember what happened the night before because you had been drinking?: Less than monthly 9. Have you or someone else been injured as a result of your drinking?: Yes, but not in the last year 10. Has a relative or friend or a doctor or another health worker been concerned about your drinking or suggested you cut down?: Yes, during the last year Alcohol Use Disorder Identification Test Final Score (AUDIT): 25 Brief Intervention: Yes  Substance Abuse History in the last 12 months:  Yes.    Consequences of Substance Abuse: Medical Consequences:  Liver damage, Possible death by overdose Legal Consequences:  Arrests, jail time, Loss of driving privilege. Family Consequences:  Family discord, divorce and or separation.  Previous Psychotropic Medications: Yes (Lexapro)  Psychological Evaluations: Yes   Past Medical History:  Past Medical History  Diagnosis Date  . Depression   . GERD (gastroesophageal reflux disease)   . Complication of anesthesia     slow to wake up after last surgery  . Peripheral neuropathy (HCC)     feet and hands  . Arthritis   . Hepatitis     hep c  . High cholesterol   . High blood pressure   . Neuropathy [facility])     Past Surgical History  Procedure Laterality Date  . Tonsillectomy    .  Direct laryngoscopy  12/26/2011    Procedure: DIRECT LARYNGOSCOPY;  Surgeon: Melvenia Beam, MD;  Location: [facility] OR;  Service: ENT;  Laterality: N/A;  Directy Laryngoscopy with biopsy [phone]  . Direct laryngoscopy N/A 10/21/2012    Procedure: DIRECT LARYNGOSCOPY;  Surgeon: Melvenia Beam, MD;  Location: [facility] OR;  Service: ENT;  Laterality: N/A;  . Esophagogastroduodenoscopy N/A 10/25/2012    Procedure: ESOPHAGOGASTRODUODENOSCOPY (EGD);  Surgeon: Charna Elizabeth, MD;  Location: [facility] ENDOSCOPY;  Service: Endoscopy;  Laterality: N/A;   Family History:  Family History  Problem Relation Age of Onset  . Hypertension Mother   . Diverticulosis Mother   . GER disease Mother   . Lung disease Sister   . Polymyositis Sister    Family Psychiatric  History: Denies any familial hx of depression or substance abuse.  Tobacco Screening: Smokes 1/2 pack of cigarettes daily.  Social History:  History  Alcohol Use  . 42.0 oz/week  .  0 Standard drinks or equivalent, 70 Cans of beer per week    Comment: [place] - currently in [facility] for counseling/rehab. last drink 06/29/14     History  Drug Use  . 0.20 per week    Comment: "Crack" quit 06/29/14    Additional Social History:  Allergies:  No Known Allergies  Lab Results: No results found for this or any previous visit (from the past 48 hour(s)).  Blood Alcohol level:  Lab Results  Component Value Date   [facility] 231* 08/31/2015   Metabolic Disorder Labs:  No results found for: HGBA1C, MPG No results found for: PROLACTIN Lab Results  Component Value Date   CHOL 146 03/29/2014   TRIG 97 03/29/2014   HDL 41 03/29/2014   CHOLHDL 3.6 03/29/2014   VLDL 18 08/30/2008   LDLCALC 86 03/29/2014   LDLCALC 108* 08/30/2008   Current Medications: Current Facility-Administered Medications  Medication Dose Route Frequency Provider Last Rate Last Dose  . acetaminophen (TYLENOL) tablet 650 mg  650 mg Oral Q6H PRN Thermon Leyland, NP      . alum & mag hydroxide-simeth  (MAALOX/MYLANTA) 200-200-20 MG/5ML suspension 30 mL  30 mL Oral Q4H PRN Thermon Leyland, NP      . chlordiazePOXIDE (LIBRIUM) capsule 25 mg  25 mg Oral Q6H PRN Adonis Brook, NP      . chlordiazePOXIDE (LIBRIUM) capsule 25 mg  25 mg Oral QID Adonis Brook, NP   25 mg at 09/04/15 0834   Followed by  . [START ON 09/05/2015] chlordiazePOXIDE (LIBRIUM) capsule 25 mg  25 mg Oral TID Adonis Brook, NP       Followed by  . [START ON 09/06/2015] chlordiazePOXIDE (LIBRIUM) capsule 25 mg  25 mg Oral BH-qamhs Adonis Brook, NP       Followed by  . [START ON 09/07/2015] chlordiazePOXIDE (LIBRIUM) capsule 25 mg  25 mg Oral Daily Adonis Brook, NP      . escitalopram (LEXAPRO) tablet 10 mg  10 mg Oral Daily Thermon Leyland, NP   10 mg at 09/04/15 0834  . feeding supplement (ENSURE ENLIVE) (ENSURE ENLIVE) liquid 237 mL  237 mL Oral BID BM Thermon Leyland, NP   237 mL at 09/03/15 1523  . fluticasone (FLONASE) 50 MCG/ACT nasal spray 2 spray  2 spray Each Nare Daily PRN Charm Rings, NP      . gabapentin (NEURONTIN) capsule 300 mg  300 mg Oral BID PRN Charm Rings, NP   300 mg at 09/03/15 0836  . hydrOXYzine (ATARAX/VISTARIL) tablet 25 mg  25 mg Oral Q6H PRN Adonis Brook, NP      . hydrOXYzine (ATARAX/VISTARIL) tablet 25 mg  25 mg Oral Q6H PRN Adonis Brook, NP   25 mg at 09/03/15 2309  . ibuprofen (ADVIL,MOTRIN) tablet 600 mg  600 mg Oral Q8H PRN Charm Rings, NP   600 mg at 09/02/15 2133  . loperamide (IMODIUM) capsule 2-4 mg  2-4 mg Oral PRN Adonis Brook, NP      . magnesium hydroxide (MILK OF MAGNESIA) suspension 30 mL  30 mL Oral Daily PRN Thermon Leyland, NP      . multivitamin with minerals tablet 1 tablet  1 tablet Oral Daily Adonis Brook, NP   1 tablet at 09/04/15 0834  . nicotine (NICODERM CQ - dosed in mg/24 hours) patch 21 mg  21 mg Transdermal Daily Charm Rings, NP   21 mg at 09/04/15 0834  . ondansetron (ZOFRAN) tablet  4 mg  4 mg Oral Q8H PRN Charm Rings, NP      . ondansetron  (ZOFRAN-ODT) disintegrating tablet 4 mg  4 mg Oral Q6H PRN Adonis Brook, NP      . ondansetron (ZOFRAN-ODT) disintegrating tablet 4 mg  4 mg Oral Q6H PRN Adonis Brook, NP      . pantoprazole (PROTONIX) EC tablet 40 mg  40 mg Oral Daily Charm Rings, NP   40 mg at 09/04/15 1610  . pneumococcal 23 valent vaccine (PNU-IMMUNE) injection 0.5 mL  0.5 mL Intramuscular Tomorrow-1000 Thermon Leyland, NP   0.5 mL at 09/03/15 1517  . pravastatin (PRAVACHOL) tablet 40 mg  40 mg Oral q1800 Charm Rings, NP   40 mg at 09/03/15 2306  . propranolol (INDERAL) tablet 80 mg  80 mg Oral BID Charm Rings, NP   80 mg at 09/04/15 9604  . traZODone (DESYREL) tablet 50 mg  50 mg Oral QHS Charm Rings, NP   50 mg at 09/03/15 2200  . zolpidem (AMBIEN) tablet 5 mg  5 mg Oral QHS PRN Charm Rings, NP   5 mg at 09/03/15 2309   PTA Medications: Prescriptions prior to admission  Medication Sig Dispense Refill Last Dose  . Ascorbic Acid (VITAMIN C) 1000 MG tablet Take 1,000 mg by mouth daily.   unknown at Unknown time  . escitalopram (LEXAPRO) 20 MG tablet Take 20 mg by mouth daily.   unkonwn  . fluticasone (FLONASE) 50 MCG/ACT nasal spray Place 2 sprays into the nose daily as needed for allergies.    unknown  . folic acid (FOLVITE) 1 MG tablet Take 1 tablet (1 mg total) by mouth daily. (Patient not taking: Reported on 08/31/2015) 30 tablet 6 Taking  . gabapentin (NEURONTIN) 300 MG capsule Take one capsule by mouth twice daily for pains (Patient taking differently: Take 300 mg by mouth 2 (two) times daily as needed (feet pain.). Take one capsule by mouth twice daily for pains) 180 capsule 3 unknown at Unknown time  . Ledipasvir-Sofosbuvir (HARVONI) 90-400 MG TABS Take 1 tablet by mouth daily. 28 tablet 2   . lovastatin (MEVACOR) 20 MG tablet Take 1 tablet (20 mg total) by mouth at bedtime. For High Cholesterol 90 tablet 3 unknown at Unknown time  . neomycin-polymyxin-hydrocortisone (CORTISPORIN) otic solution 1- 2   Drops to the toe after soaking twice daily (Patient not taking: Reported on 08/31/2015) 10 mL 1 Taking  . omeprazole (PRILOSEC) 40 MG capsule Take one tablet by mouth once daily for stomach 90 capsule 3 unknown at Unknown time  . primidone (MYSOLINE) 50 MG tablet Take 1 tablet (50 mg total) by mouth at bedtime. 90 tablet 3 unkonwn at Unknown time  . propranolol (INDERAL) 80 MG tablet Take 1 tablet (80 mg total) by mouth 2 (two) times daily. 180 tablet 3 unkknown at Unknown time  . thiamine (VITAMIN B-1) 100 MG tablet Take 1 tablet (100 mg total) by mouth daily. (Patient not taking: Reported on 08/31/2015) 30 tablet 6 Taking  . traZODone (DESYREL) 50 MG tablet Take 50 mg by mouth at bedtime.  0 unknown at Unknown time   Musculoskeletal: Strength & Muscle Tone: within normal limits Gait & Station: normal Patient leans: N/A  Psychiatric Specialty Exam: Physical Exam  Constitutional: He is oriented to person, place, and time. He appears well-developed.  HENT:  Head: Normocephalic.  Eyes: Pupils are equal, round, and reactive to light.  Neck: Normal range of  motion.  Cardiovascular: Normal rate.   Respiratory: Effort normal.  GI: Soft.  Genitourinary:  Denies any issues in this area  Musculoskeletal: Normal range of motion.  Neurological: He is alert and oriented to person, place, and time.  Skin: Skin is warm and dry.  Psychiatric: His speech is normal and behavior is normal. Thought content normal. His mood appears not anxious. His affect is not angry, not blunt, not labile and not inappropriate. Cognition and memory are normal. He expresses impulsivity. He exhibits a depressed mood (Rates #8).    Review of Systems  Constitutional: Positive for malaise/fatigue.  HENT: Negative.   Eyes: Negative.   Respiratory: Negative.   Cardiovascular: Negative.   Gastrointestinal: Negative.   Genitourinary: Negative.   Musculoskeletal: Positive for myalgias, joint pain and falls (Risk for fall).   Skin: Negative.   Neurological: Positive for dizziness and weakness.  Endo/Heme/Allergies: Negative.   Psychiatric/Behavioral: Positive for depression (Rates #8) and substance abuse (Alcohol & Cocaine dependence). Negative for suicidal ideas, hallucinations and memory loss. The patient has insomnia. The patient is not nervous/anxious.     Blood pressure 131/74, pulse 61, temperature 98.7 F (37.1 C), temperature source Oral, resp. rate 20, height 6\' 1"  (1.854 m), weight 74.844 kg (165 lb), SpO2 98 %.Body mass index is 21.77 kg/(m^2).  General Appearance: Disheveled, appears weak & sluggish  Eye Contact::  Fair  Speech:  Clear and Coherent and Normal Rate  Volume:  Decreased  Mood:  Depressed, rated depression #8, denies anxiety symptoms.  Affect:  Flat  Thought Process:  Coherent, Goal Directed and Intact  Orientation:  Full (Time, Place, and Person)  Thought Content:  Rumination, denies any hallucinations, delusions or paranoia.  Suicidal Thoughts:  Denies  Homicidal Thoughts:  Denies  Memory:  Immediate;   Good Recent;   Good Remote;   Good  Judgement:  Fair  Insight:  Present  Psychomotor Activity:  Decreased  Concentration:  Fair  Recall:  [employer] of Knowledge:Fair  Language: Good  Akathisia:  No  Handed:  Right  AIMS (if indicated):     Assets:  Communication Skills Desire for Improvement  ADL's:  Intact  Cognition: WNL  Sleep:  Number of Hours: 4.75   Treatment Plan/Recommendations: 1. Admit for crisis management and stabilization, estimated length of stay 3-5 days.  2. Medication management to reduce current symptoms to base line and improve the patient's overall level of functioning; Continue Librium  Detox protocols for alcohol detox, Lexapro 10 mg for depression, Gabapentin 300 mg for agitation/Neuropathic pain, Trazodone 50 mg for insomnia, Ambien 5 mg for insomnia, 3. Treat health problems as indicated.  4. Develop treatment plan to decrease risk of relapse  upon discharge and the need for readmission.  5. Psycho-social education regarding relapse prevention and self care.  6. Health care follow up as needed for medical problems.  7. Review, reconcile, and reinstate any pertinent home medications for other health issues where appropriate; Propranolol 80 mg for HTN, Protonix 40 mg for GERD, Pravachol 40 mg for high cholesterol, Vitamin C. 1,000 mg for Vit. C supplementation,  8. Call for consults with hospitalist for any additional specialty patient care services as needed.  Observation Level/Precautions:  15 minute checks  Laboratory:  Per ED, BAL 231, (+) Cocaine  Psychotherapy: Group sessions   Medications: Continue Librium  Detox protocols for alcohol detox, Lexapro 10 mg for depression, Gabapentin 300 mg for agitation/Neuropathic pain, Trazodone 50 mg for insomnia, Ambien 5 mg for insomnia,  Propranolol 80 mg for HTN, Protonix 40 mg for GERD, Pravachol 40 mg for high cholesterol, Vitamin C. 1,000 mg for Vit. C supplementation,   Consultations: As needed   Discharge Concerns: A safety, maintaining sobriety   Estimated LOS: 2-4 days  Other: Admit to [facility]    I certify that inpatient services furnished can reasonably be expected to improve the patient's condition.    Sanjuana Kava, NP, PMHNP, FNP-BC 3/28/201710:40 AM I personally assessed the patient, reviewed the physical exam and labs and formulated the treatment plan Madie Reno A. Dub Mikes, M.D.

## 2015-09-04 NOTE — Progress Notes (Signed)
Pt has been in bed this shift and did not go to group   He does not shake as bad as yesterday and is able to hold his beverage with some assistance   He continues to be weak and unsteady and needs assistance with ADLs and ambulation     Pt on 1:1 for safety and is presently safe

## 2015-09-04 NOTE — [facility] Group Notes (Signed)
[facility] LCSW Group Therapy  09/04/2015 1:20 PM  Type of Therapy:  Group Therapy  Participation Level:  Did Not Attend-pt invited. Chose to remain in bed to rest.  Summary of Progress/Problems: [facility] Speaker came to talk about his personal journey with substance abuse and addiction.   Smart, Taahir Grisby LCSW 09/04/2015, 1:20 PM

## 2015-09-04 NOTE — Progress Notes (Signed)
Pt in bed asleep  No distress noted   He continues to be unsteady and weak and has severe tremors   Pt on a 1:1 for safety   Safety maintained

## 2015-09-04 NOTE — Progress Notes (Addendum)
1:1   0840  Patient laying in bed with eyes closed.  Patient denied SI and HI, contracts for safety. Denied A/V hallucinations.  Respirations even and unlabored.  No signs/symptoms of pain/distress noted on patient's face/body movements.  Patient continues to have tremors.  Night shift stated blood was in his urinal.  Patient stated he needs to discuss medications with MD, concerned about his neurotin.  Walker and wheelchair in his room with 1:1 present for safety per MD order.    1:1  1025  Patient in bed sleeping.  Respirations even and unlabored.  No signs/symptoms of pain/distress noted on patient's face/body movements.  1:1 present with patient for safety.  Safety maintained.

## 2015-09-04 NOTE — [facility] Suicide Risk Assessment (Signed)
[facility]   Nursing information obtained from:  Patient, Review of record Demographic factors:  Male, Age 65 or older, Low socioeconomic status, Living alone, Unemployed, Access to firearms Current Mental Status:  Suicidal ideation indicated by patient Loss Factors:  Decline in physical health Historical Factors:  Family history of mental illness or substance abuse Risk Reduction Factors:  Positive coping skills or problem solving skills  Total Time spent with patient: 45 minutes Principal Problem: <principal problem not specified> Diagnosis:   Patient Active Problem List   Diagnosis Date Noted  . Alcohol dependence, uncomplicated (HCC) [F10.20] 09/01/2015  . Alcohol-induced mood disorder (HCC) [F10.94] 09/01/2015  . Cocaine abuse [F14.10] 09/01/2015  . Alcohol abuse [F10.10] 04/05/2014  . Essential tremor [G25.0] 01/04/2014  . Other and unspecified hyperlipidemia [E78.5] 01/04/2014  . B12 deficiency [E53.8] 01/04/2014  . Melena [K92.1] 10/23/2012  . GERD [K21.9] 05/27/2010  . SHOULDER PAIN, LEFT [M25.519] 05/21/2010  . ONYCHOMYCOSIS, TOENAILS [B35.1] 05/09/2010  . KNEE PAIN, BILATERAL [M25.569] 06/27/2009  . LEG CRAMPS [R25.2] 06/27/2009  . Chronic hepatitis C without hepatic coma (HCC) [B18.2] 09/05/2008  . CONTUSION OF UNSPECIFIED SITE [T14.8] 08/18/2008  . LEG EDEMA, BILATERAL [R60.9] 01/13/2008  . ERECTILE DYSFUNCTION [F52.8] 10/15/2007  . TOBACCO ABUSE [F17.200] 08/16/2007  . Mononeuritis of lower limb [G57.90] 08/16/2007  . CORNS AND CALLUSES [L84] 08/16/2007  . ALCOHOL ABUSE, HX OF [F10.21] 08/16/2007   Subjective Data: 65 Y/O male with history of alcohol dependence and major depression. He is currently using Lexapro but he feels it is not helping. He has been on Lexapro for 4-5 years. He drinks every day and uses cocaine on occasion Past Psych; has [facility] to [facility] as well as [facility]. He has a current follow up appointment at the [facility].  Single  has one 10645 Y/O son. 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  World Science writerHealth Organization [facility](WHO). Score between 0-7:  no or low risk or alcohol related problems. Score between 8-15:  moderate risk of alcohol related problems. Score between 16-19:  high risk of alcohol related problems. Score 20 or above:  warrants further diagnostic evaluation for alcohol dependence and treatment.   CLINICAL FACTORS:   Depression:   Comorbid alcohol abuse/dependence Alcohol/Substance Abuse/Dependencies   Musculoskeletal: Strength & Muscle Tone: within normal limits Gait & Station: unsteady Patient leans: normal  Psychiatric Specialty Exam: Review of Systems  Constitutional: Positive for weight loss and malaise/fatigue.  HENT: Negative.   Eyes: Negative.   Respiratory:       Smokes pack q 3 days  Cardiovascular: Negative.   Gastrointestinal: Positive for heartburn, nausea and diarrhea.  Genitourinary: Negative.   Musculoskeletal: Positive for back pain and joint pain.  Skin: Negative.   Neurological: Positive for dizziness and weakness.  Endo/Heme/Allergies: Negative.   Psychiatric/Behavioral: Positive for depression and substance abuse. The patient is nervous/anxious and has insomnia.     Blood pressure 120/82, pulse 66, temperature 98.6 F (37 C), temperature source Oral, resp. rate 16, height 6\' 1"  (1.854 m), weight 74.844 kg (165 lb), SpO2 98 %.Body mass index is 21.77 kg/(m^2).  General Appearance: Disheveled  Eye SolicitorContact::  Fair  Speech:  Clear and Coherent and Slow  Volume:  Decreased  Mood:  Anxious and Depressed  Affect:  Restricted  Thought Process:  Coherent and Goal Directed  Orientation:  Full (Time, Place, and Person)  Thought Content:  symptoms  events worries concerns  Suicidal Thoughts:  Yes.  without intent/plan  Homicidal Thoughts:  No  Memory:  Immediate;   Fair Recent;   Fair Remote;   Fair  Judgement:  Fair  Insight:  Present and Shallow  Psychomotor Activity:  Restlessness  Concentration:  Fair  Recall:  [employer] of Knowledge:Fair  Language: Fair  Akathisia:  No  Handed:  Right  AIMS (if indicated):     Assets:  Desire for Improvement  Sleep:  Number of Hours: 4.75  Cognition: WNL  ADL's:  Intact    COGNITIVE FEATURES THAT CONTRIBUTE TO RISK:  Closed-mindedness, Polarized thinking and Thought constriction (tunnel vision)    SUICIDE RISK:   Moderate:  Frequent suicidal ideation with limited intensity, and duration, some specificity in terms of plans, no associated intent, good self-control, limited dysphoria/symptomatology, some risk factors present, and identifiable protective factors, including available and accessible social support.  PLAN OF CARE: Supportive approach/coping skills Alcohol dependence; Librium detox protocol/work a relapse prevention plan Depression; will D/C the Lexapro as he states it has not worked for years now and he doubts it is going to work. Will reassess for the use of another antidepressant Neuropathy; will resume his Neurontin  Work with CBT/mindfulness Explore residential treatment options Continue 1:1 OBS due to fallrisk  I certify that inpatient services furnished can reasonably be expected to improve the patient's condition.   Rachael Fee, MD 09/04/2015, 12:16 PM

## 2015-09-05 NOTE — Progress Notes (Signed)
1:1 note; Pt is currently laying in bed with eyes closed, no sign of distress. No fall observed or reported. Pt maintained on 1:1 for safety, staff by the bedside, will continue to monitor.

## 2015-09-05 NOTE — Progress Notes (Signed)
Recreation Therapy Notes  Date: 03.29.2017 Time: 9:30am Location: [address] Group Room   Group Topic: Stress Management  Goal Area(s) Addresses:  Patient will actively participate in stress management techniques presented during session.   Behavioral Response: Did not attend.  Marykay Lexenise L Leveda Kendrix, LRT/CTRS        Jemal Miskell L 09/05/2015 9:53 AM

## 2015-09-05 NOTE — Progress Notes (Signed)
No change from previous two assessments   Pt remains on a 1:1 for safety and is presently safe

## 2015-09-05 NOTE — Progress Notes (Addendum)
1:1 Note; Pt is seated beside the bed finishing eating dinner, pt took his evening medication without problems. Pt still shakes, no fall observed or reported, remains on 1:1 observation, staff by the pt, will continue to monitor.

## 2015-09-05 NOTE — Progress Notes (Signed)
[facility] MD Progress Note  09/05/2015 6:30 PM Thomas Mcneil  MRN:  [id number] Subjective:  Thomas Mcneil continues to be detox. He states he prefers not to go into a residential treatment program as he has an appointment with the [place] in April 15. States he does not want to miss this appointment as it took a really longtime to get it. States once he is detox he will be able to take if from there Principal Problem: Severe recurrent major depression without psychotic features (HCC) Diagnosis:   Patient Active Problem List   Diagnosis Date Noted  . Alcohol use disorder, severe, dependence (HCC) [F10.20] 09/04/2015  . Severe recurrent major depression without psychotic features (HCC) [F33.2] 09/04/2015  . Alcohol dependence, uncomplicated (HCC) [F10.20] 09/01/2015  . Alcohol-induced mood disorder (HCC) [F10.94] 09/01/2015  . Cocaine abuse [F14.10] 09/01/2015  . Alcohol abuse [F10.10] 04/05/2014  . Essential tremor [G25.0] 01/04/2014  . Other and unspecified hyperlipidemia [E78.5] 01/04/2014  . B12 deficiency [E53.8] 01/04/2014  . Melena [K92.1] 10/23/2012  . GERD [K21.9] 05/27/2010  . SHOULDER PAIN, LEFT [M25.519] 05/21/2010  . ONYCHOMYCOSIS, TOENAILS [B35.1] 05/09/2010  . KNEE PAIN, BILATERAL [M25.569] 06/27/2009  . LEG CRAMPS [R25.2] 06/27/2009  . Chronic hepatitis C without hepatic coma (HCC) [B18.2] 09/05/2008  . CONTUSION OF UNSPECIFIED SITE [T14.8] 08/18/2008  . LEG EDEMA, BILATERAL [R60.9] 01/13/2008  . ERECTILE DYSFUNCTION [F52.8] 10/15/2007  . TOBACCO ABUSE [F17.200] 08/16/2007  . Mononeuritis of lower limb [G57.90] 08/16/2007  . CORNS AND CALLUSES [L84] 08/16/2007  . ALCOHOL ABUSE, HX OF [F10.21] 08/16/2007   Total Time spent with patient: 20 minutes  Past Psychiatric History: see admission H and P  Past Medical History:  Past Medical History  Diagnosis Date  . Depression   . GERD (gastroesophageal reflux disease)   . Complication of anesthesia     slow to wake up after last surgery   . Peripheral neuropathy (HCC)     feet and hands  . Arthritis   . Hepatitis     hep c  . High cholesterol   . High blood pressure   . Neuropathy [facility])     Past Surgical History  Procedure Laterality Date  . Tonsillectomy    . Direct laryngoscopy  12/26/2011    Procedure: DIRECT LARYNGOSCOPY;  Surgeon: Melvenia Beam, MD;  Location: [facility] OR;  Service: ENT;  Laterality: N/A;  Directy Laryngoscopy with biopsy [phone]  . Direct laryngoscopy N/A 10/21/2012    Procedure: DIRECT LARYNGOSCOPY;  Surgeon: Melvenia Beam, MD;  Location: [facility] OR;  Service: ENT;  Laterality: N/A;  . Esophagogastroduodenoscopy N/A 10/25/2012    Procedure: ESOPHAGOGASTRODUODENOSCOPY (EGD);  Surgeon: Charna Elizabeth, MD;  Location: [facility] ENDOSCOPY;  Service: Endoscopy;  Laterality: N/A;   Family History:  Family History  Problem Relation Age of Onset  . Hypertension Mother   . Diverticulosis Mother   . GER disease Mother   . Lung disease Sister   . Polymyositis Sister    Family Psychiatric  History: see admission H and P Social History:  History  Alcohol Use  . 42.0 oz/week  . 0 Standard drinks or equivalent, 70 Cans of beer per week    Comment: [place] - currently in [facility] for counseling/rehab. last drink 06/29/14     History  Drug Use  . 0.20 per week    Comment: "Crack" quit 06/29/14    Social History   Social History  . Marital Status: Widowed    Spouse Name: N/A  . Number of Children: N/A  .  Years of Education: N/A   Social History Main Topics  . Smoking status: Current Some Day Smoker -- 0.25 packs/day for 45 years    Types: Cigarettes    Start date: 06/09/2012  . Smokeless tobacco: None     Comment: quitting, using e-cig  . Alcohol Use: 42.0 oz/week    0 Standard drinks or equivalent, 70 Cans of beer per week     Comment: [place] - currently in [facility] for counseling/rehab. last drink 06/29/14  . Drug Use: 0.20 per week     Comment: "Crack" quit 06/29/14  . Sexual Activity: Not Asked   Other Topics Concern   . None   Social History Narrative   Additional Social History:                         Sleep: Fair  Appetite:  Fair  Current Medications: Current Facility-Administered Medications  Medication Dose Route Frequency Provider Last Rate Last Dose  . acetaminophen (TYLENOL) tablet 650 mg  650 mg Oral Q6H PRN Thermon LeylandLaura A Davis, NP      . alum & mag hydroxide-simeth (MAALOX/MYLANTA) 200-200-20 MG/5ML suspension 30 mL  30 mL Oral Q4H PRN Thermon LeylandLaura A Davis, NP      . chlordiazePOXIDE (LIBRIUM) capsule 25 mg  25 mg Oral Q6H PRN Adonis BrookSheila Agustin, NP      . Melene Muller[START ON 09/06/2015] chlordiazePOXIDE (LIBRIUM) capsule 25 mg  25 mg Oral BH-qamhs Adonis BrookSheila Agustin, NP       Followed by  . [START ON 09/07/2015] chlordiazePOXIDE (LIBRIUM) capsule 25 mg  25 mg Oral Daily Adonis BrookSheila Agustin, NP      . escitalopram (LEXAPRO) tablet 10 mg  10 mg Oral Daily Thermon LeylandLaura A Davis, NP   10 mg at 09/05/15 [id number]  . feeding supplement (ENSURE ENLIVE) (ENSURE ENLIVE) liquid 237 mL  237 mL Oral BID BM Thermon LeylandLaura A Davis, NP   237 mL at 09/05/15 1436  . fluticasone (FLONASE) 50 MCG/ACT nasal spray 2 spray  2 spray Each Nare Daily PRN Charm RingsJamison Y Lord, NP      . gabapentin (NEURONTIN) capsule 300 mg  300 mg Oral TID Rachael FeeIrving A Ticia Virgo, MD   300 mg at 09/05/15 1801  . hydrOXYzine (ATARAX/VISTARIL) tablet 25 mg  25 mg Oral Q6H PRN Adonis BrookSheila Agustin, NP      . hydrOXYzine (ATARAX/VISTARIL) tablet 25 mg  25 mg Oral Q6H PRN Adonis BrookSheila Agustin, NP   25 mg at 09/03/15 2309  . ibuprofen (ADVIL,MOTRIN) tablet 600 mg  600 mg Oral Q8H PRN Charm RingsJamison Y Lord, NP   600 mg at 09/02/15 2133  . loperamide (IMODIUM) capsule 2-4 mg  2-4 mg Oral PRN Adonis BrookSheila Agustin, NP      . magnesium hydroxide (MILK OF MAGNESIA) suspension 30 mL  30 mL Oral Daily PRN Thermon LeylandLaura A Davis, NP      . multivitamin with minerals tablet 1 tablet  1 tablet Oral Daily Adonis BrookSheila Agustin, NP   1 tablet at 09/05/15 [phone]  . nicotine (NICODERM CQ - dosed in mg/24 hours) patch 21 mg  21 mg Transdermal Daily Charm RingsJamison Y  Lord, NP   21 mg at 09/05/15 [id number]  . ondansetron (ZOFRAN) tablet 4 mg  4 mg Oral Q8H PRN Charm RingsJamison Y Lord, NP      . ondansetron (ZOFRAN-ODT) disintegrating tablet 4 mg  4 mg Oral Q6H PRN Adonis BrookSheila Agustin, NP      . ondansetron (ZOFRAN-ODT) disintegrating tablet 4 mg  4 mg Oral  Q6H PRN Adonis Brook, NP      . pantoprazole (PROTONIX) EC tablet 40 mg  40 mg Oral Daily Charm Rings, NP   40 mg at 09/05/15 6045  . pneumococcal 23 valent vaccine (PNU-IMMUNE) injection 0.5 mL  0.5 mL Intramuscular Tomorrow-1000 Thermon Leyland, NP   0.5 mL at 09/03/15 1517  . pravastatin (PRAVACHOL) tablet 40 mg  40 mg Oral q1800 Charm Rings, NP   40 mg at 09/04/15 1746  . propranolol (INDERAL) tablet 80 mg  80 mg Oral BID Charm Rings, NP   80 mg at 09/05/15 1801  . traZODone (DESYREL) tablet 50 mg  50 mg Oral QHS Charm Rings, NP   50 mg at 09/04/15 2213  . vitamin C (ASCORBIC ACID) tablet 1,000 mg  1,000 mg Oral Daily Sanjuana Kava, NP   1,000 mg at 09/05/15 0831  . zolpidem (AMBIEN) tablet 5 mg  5 mg Oral QHS PRN Charm Rings, NP   5 mg at 09/04/15 2213    Lab Results: No results found for this or any previous visit (from the past 48 hour(s)).  Blood Alcohol level:  Lab Results  Component Value Date   [facility] 231* 08/31/2015    Physical Findings: AIMS: Facial and Oral Movements Muscles of Facial Expression: None, normal Lips and Perioral Area: None, normal Jaw: None, normal Tongue: None, normal,Extremity Movements Upper (arms, wrists, hands, fingers): None, normal Lower (legs, knees, ankles, toes): None, normal, Trunk Movements Neck, shoulders, hips: None, normal, Overall Severity Severity of abnormal movements (highest score from questions above): None, normal Incapacitation due to abnormal movements: None, normal Patient's awareness of abnormal movements (rate only patient's report): No Awareness, Dental Status Current problems with teeth and/or dentures?: No Does patient usually wear dentures?:  No  CIWA:  CIWA-Ar Total: 4 COWS:     Musculoskeletal: Strength & Muscle Tone: within normal limits Gait & Station: unsteady Patient leans: normal  Psychiatric Specialty Exam: Review of Systems  Constitutional: Positive for malaise/fatigue.  HENT: Negative.   Eyes: Negative.   Respiratory: Negative.   Cardiovascular: Negative.   Gastrointestinal: Negative.   Genitourinary: Negative.   Musculoskeletal: Negative.   Skin: Negative.   Neurological: Positive for tremors, sensory change and weakness.       Neuropathy  Endo/Heme/Allergies: Negative.   Psychiatric/Behavioral: Positive for depression and substance abuse. The patient is nervous/anxious.     Blood pressure 106/49, pulse 68, temperature 97.9 F (36.6 C), temperature source Oral, resp. rate 17, height  (1.854 m), weight 74.844 kg (165 lb), SpO2 98 %.Body mass index is 21.77 kg/(m^2).  General Appearance: Disheveled  Eye Solicitor::  Fair  Speech:  Clear and Coherent  Volume:  Decreased  Mood:  Anxious and Depressed  Affect:  Restricted  Thought Process:  Coherent and Goal Directed  Orientation:  Full (Time, Place, and Person)  Thought Content:  symptoms events worries concerns  Suicidal Thoughts:  No  Homicidal Thoughts:  No  Memory:  Immediate;   Fair Recent;   Fair Remote;   Fair  Judgement:  Fair  Insight:  Present and Shallow  Psychomotor Activity:  Decreased  Concentration:  Fair  Recall:  [employer] of Knowledge:Fair  Language: Fair  Akathisia:  No  Handed:  Right  AIMS (if indicated):     Assets:  Desire for Improvement Housing  ADL's:  Intact  Cognition: WNL  Sleep:  Number of Hours: 6.75   Treatment Plan Summary: Daily contact  with patient to assess and evaluate symptoms and progress in treatment and Medication management Supportive approach/coping skills Alcohol dependence; continue the Librium detox protocol/work a relapse prevention plan Depression; continue the Lexapro 10 mg optimize  dose response Work with CBT/mndfulness Rachael Fee, MD 09/05/2015, 6:30 PM

## 2015-09-05 NOTE — Progress Notes (Signed)
Pt in bed resting   No distress noted   Pt has an unsteady gait and generalized weakness    He is on a 1:1 for safety and High Fall Risk   Pt remains safe

## 2015-09-05 NOTE — B[facility] Counselor (Signed)
CSW attempted to meet with pt again today. Pt remains asleep in bed. Not able to complete PSA or discuss aftercare.  Trula SladeHeather Smart, MSW, LCSW Clinical Social Worker 09/05/2015 3:56 PM

## 2015-09-05 NOTE — Progress Notes (Signed)
1:1 note; Pt is currently lay in bed awake and talk to staff. Pt ate 100% of his lunch. Pt denies SI/HI and vaberly  contracted for safety. No fall observed.  As per self inventory, pt had a good sleep last night, good appetite, low energy, and good concentration. Pt rates depression at 8, hopelessness at 8, and anxiety at a 0. We will continue to monitor.

## 2015-09-05 NOTE — [facility] Group Notes (Signed)
[facility]   09/05/2015 10:03 AM  Participation Quality:  Invited. Chose to rest in room. DID NOT ATTEND. Pt is still on 1:1 due to fall risk/safety.   Smart, Havish Petties LCSW

## 2015-09-05 NOTE — B[facility] Group Notes (Signed)
BHH LCSW Group Therapy  09/05/2015 12:32 PM  Type of Therapy:  Group Therapy  Participation Level:  Did Not Attend-pt chose to remain in bed to rest.   Modes of Intervention:  Confrontation, Discussion, Education, Exploration, Problem-solving, Rapport Building, Socialization and Support  Summary of Progress/Problems: Self Sabotage and and Enabling Behaviors. Group members were asked to define these terms and identify instances of self sabotage in their experiences with substance abuse/relapse. Patients discussed how these examples of self sabotoge impacted their sobriety and how to avoid self sabotoge in the future. Group members were asked to identify a time when they were enabled or enabled someone else. Patients were encouraged to explore unhealthy relationships in their lives and ways to stop enabling behaviors.   Smart, Tala Eber LCSW 09/05/2015, 12:32 PM

## 2015-09-05 NOTE — Progress Notes (Signed)
Pt attended group and ambulated to the medication window with his walker with stand by assist he remains weak unsteady and tremulous    Pt on 1:1 for safety   Safety maintained

## 2015-09-06 NOTE — Progress Notes (Signed)
No change from previous two assessments   Pt on 1:1 for safety and iss presently safe

## 2015-09-06 NOTE — Progress Notes (Signed)
1:1 Note: Patient maintained on constant supervision for safety.  Patient remained in his room most of the shift.  No issues noted or reported.  Medications given as prescribed.  Support and encouragement offered as needed.

## 2015-09-06 NOTE — Progress Notes (Signed)
1:1 Note:  Patient maintained on constant supervision for safety.  Patient's gait remained on unsteady.  Visible tremors noted.  Difficulties holding things due to the shakes.  Medications given as prescribed.  Patient remained in his room most of the shift.  Reports symptoms of withdrawal.  Routine safety checks maintained.  Sitter at bedside for safety.

## 2015-09-06 NOTE — Progress Notes (Signed)
1:1 Note:  Patient maintained on constant supervision for safety.  No issues noted or reported.  Ambulated on the unit with walker.  Staff at bedside for supervision.  Denies auditory and visual hallucination.  Medication given as prescribed.  Routine safety checks maintained.

## 2015-09-06 NOTE — B[facility] Group Notes (Signed)
[facility] LCSW Group Therapy  09/06/2015 2:36 PM  Type of Therapy:  Group Therapy-pt chose to remain in bed to rest.   Participation Level:  Did Not Attend   Modes of Intervention:  Confrontation, Discussion, Education, Exploration, Problem-solving, Rapport Building, Socialization and Support  Summary of Progress/Problems: Emotion Regulation: This group focused on both positive and negative emotion identification and allowed group members to process ways to identify feelings, regulate negative emotions, and find healthy ways to manage internal/external emotions. Group members were asked to reflect on a time when their reaction to an emotion led to a negative outcome and explored how alternative responses using emotion regulation would have benefited them. Group members were also asked to discuss a time when emotion regulation was utilized when a negative emotion was experienced.   Smart, Darlisa Spruiell LCSW 09/06/2015, 2:36 PM

## 2015-09-06 NOTE — Progress Notes (Addendum)
Patient ID: [name], male   DOB: 01/09/1951, 65 y.o.   MRN: [id number] 1:1 notes  09/06/2015 @ 2200  D: Patient in bed trying to open a pack of cookies. Pt hands very tremorous ,took a while to open pack. Pt refuse help from Clinical research associatewriter. Pt denies any other withdrawal symptoms. Pt denies SI/HI.   A: 1:1 observation for safety. Medication administered as prescribed. R: Patient is safe. Sitter at bedside. 1:1 continues

## 2015-09-06 NOTE — BHH Counselor (Addendum)
Adult Comprehensive Assessment  Patient ID: Thomas Mcneil, male   DOB: November 26, 1950, 65 y.o.   MRN: [id number]  Information Source: Information source:  (patient withdrawing and lethargic during assessment. difficult for him to answer some questions)  Current Stressors:  Educational / Learning stressors: high school Employment / Job issues: on disability  Family Relationships: mother is still living and is a positive support for pt.  Financial / Lack of resources (include bankruptcy): pt is on disability and is a veteran/linked with [facility].  Physical health (include injuries & life threatening diseases): neuropathy in legs/feet. fall rist and is on 1:1 for safety.  Substance abuse: pt reports ongoing/chronic alcohol abuse x 50 yrs and cocaine abuse x 20 years with intermittent periods of sobriety. pt report recent increase in alcohol consumption over the past 2 months  Bereavement / Loss: widowed   Living/Environment/Situation:  Living Arrangements: Alone Living conditions (as described by patient or guardian): lives alone in home How long has patient lived in current situation?: several years What is atmosphere in current home: Comfortable, Other (Comment) (lonely)  Family History:  Marital status: Widowed Widowed, when?: several years ago Are you sexually active?: No What is your sexual orientation?: heterosexual  Has your sexual activity been affected by drugs, alcohol, medication, or emotional stress?: n/a  Does patient have children?: No  Childhood History:  By whom was/is the patient raised?: Both parents Additional childhood history information: "fair" alcohol abuse runs in his family (paternal side) Description of patient's relationship with caregiver when they were a child: close to both parents; especially mother Patient's description of current relationship with people who raised him/her: father is deceased. pt is close to his mother How were you disciplined when you got in  trouble as a child/adolescent?: n/a  Does patient have siblings?: Yes, his sister Eldridge Dace [phone] (point of contact).  Did patient suffer any verbal/emotional/physical/sexual abuse as a child?: No Did patient suffer from severe childhood neglect?: No Has patient ever been sexually abused/assaulted/raped as an adolescent or adult?: No Was the patient ever a victim of a crime or a disaster?: No Witnessed domestic violence?: No Has patient been effected by domestic violence as an adult?: No  Education:  Highest grade of school patient has completed: high school  Currently a Consulting civil engineer?: No Learning disability?: No  Employment/Work Situation:   Employment situation: On disability Why is patient on disability: retired and disabled veteran How long has patient been on disability: several years  Patient's job has been impacted by current illness: No 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?: No Are There Guns or Other Weapons in Your Home?: No (pt reports that guns are not in his home) Are These Comptroller?: No Who Could Verify You Are Able To Have These Secured:: n/a   Financial Resources:   Financial resources: [employer], [employer], Receives SSI Does patient have a representative payee or guardian?: No  Alcohol/Substance Abuse:   What has been your use of drugs/alcohol within the last 12 months?: pt reports drinking 1/5 to 2/5 of vodka daily for the past two months and has been chronically drinking for the past 50 years. pt also reports cocaine abuse for the past  20 years-amount vaires (few times weekly on average). few periods of sobriety. pt reports increased  deperssion over the past 2 months which has led to an increase in alcohol consumption.  If attempted suicide, did drugs/alcohol play a role in this?: No (pt reported passive SI upon admission due to depression (he was intoxicated at admission)) Alcohol/Substance Abuse Treatment Hx: Past Tx, Outpatient, Attends AA/NA If yes, describe treatment[place] [facility] [place] [facility] in [place]  Has alcohol/substance abuse ever caused legal problems?: No  Social Support System:   Forensic psychologistatient's Community Support System: Poor Describe Community Support System: few friends and family left within the community. his mother is positive support Type of faith/religion: christian How does patient's faith help to cope with current illness?: n/a   Leisure/Recreation:   Leisure and Hobbies: declined to answer  Strengths/Needs:   What things does the patient do well?: declined to answer In what areas does patient struggle / problems for patient: declined to answer  Discharge Plan:   Does patient have access to transportation?: Yes Will patient be returning to same living situation after discharge?: Yes (home) Currently receiving community mental health services: Yes (From Whom) ([facility]) If no, would patient like referral for services when discharged?: No ([place] (pt also [facility] at [facility] Does patient have financial barriers related to discharge medications?: No (SSI/Medicare)  Summary/Recommendations:   Summary and Recommendations (to be completed by the evaluator): Patient is 65 year old male living in [place], [place] Patient presents to the hospital seeking treatment for suicidal ideations, increased depression, chronic alcohol and cocaine abuse, and for medication stabilization. He has a diagnosis of Alcohol Induced Mood Disorder and Cocaine abuse. Patient denies SI/HI/AVH at this time. He plans to return home and follow-[facility] (appt  scheduled). Recommendations for patient include: crisis stabilization, therapeutic milieu, encourage group attendance and particiaption, medication management for withdrawals and mood stabilization, and development of comprehensive mental wellness/sobriety plan.   Smart, Imad Shostak LCSW 09/06/2015 8:49 AM

## 2015-09-06 NOTE — Progress Notes (Signed)
Pt sleeping in bed   No distress noted    Pt is on a 1:1 for safety due to being a high fall risk     Pt is presently safe

## 2015-09-06 NOTE — B[facility] Group Notes (Signed)
[facility] Group Notes:  (Nursing/MHT/Case Management/Adjunct)  Date:  09/06/2015  Time:  3:29 PM  Type of Therapy:  Nurse Education  Participation Level:  Did Not Attend    Thomas Mcneil 09/06/2015, 3:29 PM

## 2015-09-06 NOTE — Progress Notes (Signed)
Patient ID: Thomas Mcneil, male   DOB: 01/07/1951, 65 y.o.   MRN: [id number] [facility] Note  09/06/2015 6:49 PM Thomas Mcneil  MRN:  [id number] Subjective:  Thomas Mcneil continues to be detox. He continues to state that  he would prefer not to go into a residential treatment program as [place] [facility] [place] [place]. States he does not want to miss this appointment as it took a really longtime to get it. States once he is detox he will be able to take if from there. He is still feeling somewhat unsteady in his gait  and has his baseline essential tremor Principal Problem: Severe recurrent major depression without psychotic features (HCC) Diagnosis:   Patient Active Problem List   Diagnosis Date Noted  . Alcohol use disorder, severe, dependence (HCC) [F10.20] 09/04/2015  . Severe recurrent major depression without psychotic features (HCC) [F33.2] 09/04/2015  . Alcohol dependence, uncomplicated (HCC) [F10.20] 09/01/2015  . Alcohol-induced mood disorder (HCC) [F10.94] 09/01/2015  . Cocaine abuse [F14.10] 09/01/2015  . Alcohol abuse [F10.10] 04/05/2014  . Essential tremor [G25.0] 01/04/2014  . Other and unspecified hyperlipidemia [E78.5] 01/04/2014  . B12 deficiency [E53.8] 01/04/2014  . Melena [K92.1] 10/23/2012  . GERD [K21.9] 05/27/2010  . SHOULDER PAIN, LEFT [M25.519] 05/21/2010  . ONYCHOMYCOSIS, TOENAILS [B35.1] 05/09/2010  . KNEE PAIN, BILATERAL [M25.569] 06/27/2009  . LEG CRAMPS [R25.2] 06/27/2009  . Chronic hepatitis C without hepatic coma (HCC) [B18.2] 09/05/2008  . CONTUSION OF UNSPECIFIED SITE [T14.8] 08/18/2008  . LEG EDEMA, BILATERAL [R60.9] 01/13/2008  . ERECTILE DYSFUNCTION [F52.8] 10/15/2007  . TOBACCO ABUSE [F17.200] 08/16/2007  . Mononeuritis of lower limb [G57.90] 08/16/2007  . CORNS AND CALLUSES [L84] 08/16/2007  . ALCOHOL ABUSE, HX OF [F10.21] 08/16/2007   Total Time spent with patient: 20 minutes  Past Psychiatric History: see admission H and P  Past  Medical History:  Past Medical History  Diagnosis Date  . Depression   . GERD (gastroesophageal reflux disease)   . Complication of anesthesia     slow to wake up after last surgery  . Peripheral neuropathy (HCC)     feet and hands  . Arthritis   . Hepatitis     hep c  . High cholesterol   . High blood pressure   . Neuropathy [facility](HCC)     Past Surgical History  Procedure Laterality Date  . Tonsillectomy    . Direct laryngoscopy  12/26/2011    Procedure: DIRECT LARYNGOSCOPY;  Surgeon: Melvenia BeamMitchell Gore, MD;  [facility];  Service: ENT;  Laterality: N/A;  Directy Laryngoscopy with biopsy [phone]  . Direct laryngoscopy N/A 10/21/2012    Procedure: DIRECT LARYNGOSCOPY;  Surgeon: Melvenia BeamMitchell Gore, MD;  [facility];  Service: ENT;  Laterality: N/A;  . Esophagogastroduodenoscopy N/A 10/25/2012    Procedure: ESOPHAGOGASTRODUODENOSCOPY (EGD);  Surgeon: Charna ElizabethJyothi Mann, MD;  [facility];  Service: Endoscopy;  Laterality: N/A;   Family History:  Family History  Problem Relation Age of Onset  . Hypertension Mother   . Diverticulosis Mother   . GER disease Mother   . Lung disease Sister   . Polymyositis Sister    Family Psychiatric  History: see admission H and P Social History:  History  Alcohol Use  . 42.0 oz/week  . 0 Standard drinks or equivalent, 70 Cans of beer per week    Comment: [place] - currently in [facility] for counseling/rehab. last drink 06/29/14     History  Drug Use  .  0.20 per week    Comment: "Crack" quit 06/29/14    Social History   Social History  . Marital Status: Widowed    Spouse Name: N/A  . Number of Children: N/A  . Years of Education: N/A   Social History Main Topics  . Smoking status: Current Some Day Smoker -- 0.25 packs/day for 45 years    Types: Cigarettes    Start date: 06/09/2012  . Smokeless tobacco: None     Comment: quitting, using e-cig  . Alcohol Use: 42.0 oz/week    0 Standard drinks or equivalent, 70 Cans of beer per week      Comment: [place] - currently in [facility] for counseling/rehab. last drink 06/29/14  . Drug Use: 0.20 per week     Comment: "Crack" quit 06/29/14  . Sexual Activity: Not Asked   Other Topics Concern  . None   Social History Narrative   Additional Social History:                         Sleep: Fair  Appetite:  Fair  Current Medications: Current Facility-Administered Medications  Medication Dose Route Frequency Provider Last Rate Last Dose  . acetaminophen (TYLENOL) tablet 650 mg  650 mg Oral Q6H PRN Thermon Leyland, NP      . alum & mag hydroxide-simeth (MAALOX/MYLANTA) 200-200-20 MG/5ML suspension 30 mL  30 mL Oral Q4H PRN Thermon Leyland, NP      . chlordiazePOXIDE (LIBRIUM) capsule 25 mg  25 mg Oral BH-qamhs Adonis Brook, NP   25 mg at 09/06/15 1610   Followed by  . [START ON 09/07/2015] chlordiazePOXIDE (LIBRIUM) capsule 25 mg  25 mg Oral Daily Adonis Brook, NP      . escitalopram (LEXAPRO) tablet 10 mg  10 mg Oral Daily Thermon Leyland, NP   10 mg at 09/06/15 0903  . feeding supplement (ENSURE ENLIVE) (ENSURE ENLIVE) liquid 237 mL  237 mL Oral BID BM Thermon Leyland, NP   237 mL at 09/06/15 1734  . fluticasone (FLONASE) 50 MCG/ACT nasal spray 2 spray  2 spray Each Nare Daily PRN Charm Rings, NP      . gabapentin (NEURONTIN) capsule 300 mg  300 mg Oral TID Rachael Fee, MD   300 mg at 09/06/15 1734  . ibuprofen (ADVIL,MOTRIN) tablet 600 mg  600 mg Oral Q8H PRN Charm Rings, NP   600 mg at 09/05/15 2159  . magnesium hydroxide (MILK OF MAGNESIA) suspension 30 mL  30 mL Oral Daily PRN Thermon Leyland, NP      . multivitamin with minerals tablet 1 tablet  1 tablet Oral Daily Adonis Brook, NP   1 tablet at 09/06/15 0902  . nicotine (NICODERM CQ - dosed in mg/24 hours) patch 21 mg  21 mg Transdermal Daily Charm Rings, NP   21 mg at 09/06/15 0800  . ondansetron (ZOFRAN) tablet 4 mg  4 mg Oral Q8H PRN Charm Rings, NP      . pantoprazole (PROTONIX) EC tablet 40 mg  40 mg Oral  Daily Charm Rings, NP   40 mg at 09/06/15 0901  . pneumococcal 23 valent vaccine (PNU-IMMUNE) injection 0.5 mL  0.5 mL Intramuscular Tomorrow-1000 Thermon Leyland, NP   0.5 mL at 09/03/15 1517  . pravastatin (PRAVACHOL) tablet 40 mg  40 mg Oral q1800 Charm Rings, NP   40 mg at 09/06/15 1735  . propranolol (INDERAL)  tablet 80 mg  80 mg Oral BID Charm Rings, NP   80 mg at 09/06/15 1734  . traZODone (DESYREL) tablet 50 mg  50 mg Oral QHS Charm Rings, NP   50 mg at 09/05/15 2158  . vitamin C (ASCORBIC ACID) tablet 1,000 mg  1,000 mg Oral Daily Sanjuana Kava, NP   1,000 mg at 09/06/15 1610  . zolpidem (AMBIEN) tablet 5 mg  5 mg Oral QHS PRN Charm Rings, NP   5 mg at 09/05/15 2158    Lab Results: No results found for this or any previous visit (from the past 48 hour(s)).  Blood Alcohol level:  Lab Results  Component Value Date   [facility] 231* 08/31/2015    Physical Findings: AIMS: Facial and Oral Movements Muscles of Facial Expression: None, normal Lips and Perioral Area: None, normal Jaw: None, normal Tongue: None, normal,Extremity Movements Upper (arms, wrists, hands, fingers): None, normal Lower (legs, knees, ankles, toes): None, normal, Trunk Movements Neck, shoulders, hips: None, normal, Overall Severity Severity of abnormal movements (highest score from questions above): None, normal Incapacitation due to abnormal movements: None, normal Patient's awareness of abnormal movements (rate only patient's report): No Awareness, Dental Status Current problems with teeth and/or dentures?: No Does patient usually wear dentures?: No  CIWA:  CIWA-Ar Total: 7 COWS:     Musculoskeletal: Strength & Muscle Tone: within normal limits Gait & Station: unsteady Patient leans: normal  Psychiatric Specialty Exam: Review of Systems  Constitutional: Positive for malaise/fatigue.  HENT: Negative.   Eyes: Negative.   Respiratory: Negative.   Cardiovascular: Negative.   Gastrointestinal:  Negative.   Genitourinary: Negative.   Musculoskeletal: Negative.   Skin: Negative.   Neurological: Positive for tremors, sensory change and weakness.       Neuropathy  Endo/Heme/Allergies: Negative.   Psychiatric/Behavioral: Positive for depression and substance abuse. The patient is nervous/anxious.     Blood pressure 97/51, pulse 62, temperature 97.5 F (36.4 C), temperature source Oral, resp. rate 16, height  (1.854 m), weight 74.844 kg (165 lb), SpO2 98 %.Body mass index is 21.77 kg/(m^2).  General Appearance: Disheveled  Eye Solicitor::  Fair  Speech:  Clear and Coherent  Volume:  Decreased  Mood:  Anxious and Depressed  Affect:  Restricted  Thought Process:  Coherent and Goal Directed  Orientation:  Full (Time, Place, and Person)  Thought Content:  symptoms events worries concerns  Suicidal Thoughts:  No  Homicidal Thoughts:  No  Memory:  Immediate;   Fair Recent;   Fair Remote;   Fair  Judgement:  Fair  Insight:  Present and Shallow  Psychomotor Activity:  Decreased  Concentration:  Fair  Recall:  [employer] of Knowledge:Fair  Language: Fair  Akathisia:  No  Handed:  Right  AIMS (if indicated):     Assets:  Desire for Improvement Housing  ADL's:  Intact  Cognition: WNL  Sleep:  Number of Hours: 6.75   Treatment Plan Summary: Daily contact with patient to assess and evaluate symptoms and progress in treatment and Medication management Supportive approach/coping skills Alcohol dependence; continue the Librium detox protocol/work a relapse prevention plan Depression; continue the Lexapro 10 mg optimize dose response Will ask PT to evaluate due to fall risk Work with CBT/mndfulness Romuald Mccaslin A, MD 09/06/2015, 6:49 PM

## 2015-09-06 NOTE — Progress Notes (Signed)
1:1 Note: Patient maintained on constant supervision for safety.  Patient in bed sleeping.  No behavioral issues noted.  Routine safety checks maintained.  Sitter at bedside.  Medication given as prescribed.

## 2015-09-06 NOTE — BHH Suicide Risk Assessment (Signed)
[facility] INPATIENT:  Family/Significant Other Suicide Prevention Education  Suicide Prevention Education:  Education Completed; Thomas DaceSandra Headen (pt's sister) [phone] has been identified by the patient as the family member/significant other with whom the patient will be residing, and identified as the person(s) who will aid the patient in the event of a mental health crisis (suicidal ideations/suicide attempt).  With written consent from the patient, the family member/significant other has been provided the following suicide prevention education, prior to the and/or following the discharge of the patient.  The suicide prevention education provided includes the following:  Suicide risk factors  Suicide prevention and interventions  National Suicide Hotline telephone number  [facility] assessment telephone number  [place] Emergency Assistance 911  [facility] and/or Residential Mobile Crisis Unit telephone number  Request made of family/significant other to:  Remove weapons (e.g., guns, rifles, knives), all items previously/currently identified as safety concern.    Remove drugs/medications (over-the-counter, prescriptions, illicit drugs), all items previously/currently identified as a safety concern.  The family member/significant other verbalizes understanding of the suicide prevention education information provided.  The family member/significant other agrees to remove the items of safety concern listed above.  SPE provided to pt's sister. She plans to transport patient home when he is ready for discharge and requested that CSW call her on Monday to update on discharge date. She has no concerns regarding SI/HI with pt.   Smart, Brock Larmon LCSW 09/06/2015, 11:01 AM

## 2015-09-07 NOTE — Progress Notes (Signed)
[facility]  09/07/2015 2:15 PM Thomas Mcneil  MRN:  [id number] Subjective:  Thomas Mcneil continues to have a hard time. He is still having the tremors and is unstable on his feet. He is endorsing depression. States it has never been this bad before. Confused as far a what is going on with him. [facility]  Principal Problem: Severe recurrent major depression without psychotic features (HCC) Diagnosis:   Patient Active Problem List   Diagnosis Date Noted  . Alcohol use disorder, severe, dependence (HCC) [F10.20] 09/04/2015  . Severe recurrent major depression without psychotic features (HCC) [F33.2] 09/04/2015  . Alcohol dependence, uncomplicated (HCC) [F10.20] 09/01/2015  . Alcohol-induced mood disorder (HCC) [F10.94] 09/01/2015  . Cocaine abuse [F14.10] 09/01/2015  . Alcohol abuse [F10.10] 04/05/2014  . Essential tremor [G25.0] 01/04/2014  . Other and unspecified hyperlipidemia [E78.5] 01/04/2014  . B12 deficiency [E53.8] 01/04/2014  . Melena [K92.1] 10/23/2012  . GERD [K21.9] 05/27/2010  . SHOULDER PAIN, LEFT [M25.519] 05/21/2010  . ONYCHOMYCOSIS, TOENAILS [B35.1] 05/09/2010  . KNEE PAIN, BILATERAL [M25.569] 06/27/2009  . LEG CRAMPS [R25.2] 06/27/2009  . Chronic hepatitis C without hepatic coma (HCC) [B18.2] 09/05/2008  . CONTUSION OF UNSPECIFIED SITE [T14.8] 08/18/2008  . LEG EDEMA, BILATERAL [R60.9] 01/13/2008  . ERECTILE DYSFUNCTION [F52.8] 10/15/2007  . TOBACCO ABUSE [F17.200] 08/16/2007  . Mononeuritis of lower limb [G57.90] 08/16/2007  . CORNS AND CALLUSES [L84] 08/16/2007  . ALCOHOL ABUSE, HX OF [F10.21] 08/16/2007   Total Time spent with patient: 20 minutes  Past Psychiatric History: see admission H and P  Past Medical History:  Past Medical History  Diagnosis Date  . Depression   . GERD (gastroesophageal reflux disease)   . Complication of anesthesia     slow to wake up after last surgery  . Peripheral neuropathy (HCC)     feet and hands   . Arthritis   . Hepatitis     hep c  . High cholesterol   . High blood pressure   . Neuropathy [facility](HCC)     Past Surgical History  Procedure Laterality Date  . Tonsillectomy    . Direct laryngoscopy  12/26/2011    Procedure: DIRECT LARYNGOSCOPY;  Surgeon: Melvenia BeamMitchell Gore, MD;  [facility];  Service: ENT;  Laterality: N/A;  Directy Laryngoscopy with biopsy [phone]  . Direct laryngoscopy N/A 10/21/2012    Procedure: DIRECT LARYNGOSCOPY;  Surgeon: Melvenia BeamMitchell Gore, MD;  [facility];  Service: ENT;  Laterality: N/A;  . Esophagogastroduodenoscopy N/A 10/25/2012    Procedure: ESOPHAGOGASTRODUODENOSCOPY (EGD);  Surgeon: Charna ElizabethJyothi Mann, MD;  [facility];  Service: Endoscopy;  Laterality: N/A;   Family History:  Family History  Problem Relation Age of Onset  . Hypertension Mother   . Diverticulosis Mother   . GER disease Mother   . Lung disease Sister   . Polymyositis Sister    Family Psychiatric  History: see admission H and P Social History:  History  Alcohol Use  . 42.0 oz/week  . 0 Standard drinks or equivalent, 70 Cans of beer per week    Comment: [place] - currently in [facility] for counseling/rehab. last drink 06/29/14     History  Drug Use  . 0.20 per week    Comment: "Crack" quit 06/29/14    Social History   Social History  . Marital Status: Widowed    Spouse Name: N/A  . Number of Children: N/A  . Years of Education: N/A   Social History Main Topics  .  Smoking status: Current Some Day Smoker -- 0.25 packs/day for 45 years    Types: Cigarettes    Start date: 06/09/2012  . Smokeless tobacco: None     Comment: quitting, using e-cig  . Alcohol Use: 42.0 oz/week    0 Standard drinks or equivalent, 70 Cans of beer per week     Comment: [place] - currently in [facility] for counseling/rehab. last drink 06/29/14  . Drug Use: 0.20 per week     Comment: "Crack" quit 06/29/14  . Sexual Activity: Not Asked   Other Topics Concern  . None   Social History Narrative    Additional Social History:                         Sleep: Fair  Appetite:  Fair  Current Medications: Current Facility-Administered Medications  Medication Dose Route Frequency Provider Last Rate Last Dose  . acetaminophen (TYLENOL) tablet 650 mg  650 mg Oral Q6H PRN Thermon Leyland, NP      . alum & mag hydroxide-simeth (MAALOX/MYLANTA) 200-200-20 MG/5ML suspension 30 mL  30 mL Oral Q4H PRN Thermon Leyland, NP      . escitalopram (LEXAPRO) tablet 10 mg  10 mg Oral Daily Thermon Leyland, NP   10 mg at 09/07/15 0854  . feeding supplement (ENSURE ENLIVE) (ENSURE ENLIVE) liquid 237 mL  237 mL Oral BID BM Thermon Leyland, NP   237 mL at 09/07/15 0944  . fluticasone (FLONASE) 50 MCG/ACT nasal spray 2 spray  2 spray Each Nare Daily PRN Charm Rings, NP      . gabapentin (NEURONTIN) capsule 300 mg  300 mg Oral TID Rachael Fee, MD   300 mg at 09/07/15 1218  . ibuprofen (ADVIL,MOTRIN) tablet 600 mg  600 mg Oral Q8H PRN Charm Rings, NP   600 mg at 09/06/15 2159  . magnesium hydroxide (MILK OF MAGNESIA) suspension 30 mL  30 mL Oral Daily PRN Thermon Leyland, NP      . multivitamin with minerals tablet 1 tablet  1 tablet Oral Daily Adonis Brook, NP   1 tablet at 09/07/15 0854  . nicotine (NICODERM CQ - dosed in mg/24 hours) patch 21 mg  21 mg Transdermal Daily Charm Rings, NP   21 mg at 09/07/15 0930  . ondansetron (ZOFRAN) tablet 4 mg  4 mg Oral Q8H PRN Charm Rings, NP      . pantoprazole (PROTONIX) EC tablet 40 mg  40 mg Oral Daily Charm Rings, NP   40 mg at 09/07/15 0853  . pneumococcal 23 valent vaccine (PNU-IMMUNE) injection 0.5 mL  0.5 mL Intramuscular Tomorrow-1000 Thermon Leyland, NP   0.5 mL at 09/03/15 1517  . pravastatin (PRAVACHOL) tablet 40 mg  40 mg Oral q1800 Charm Rings, NP   40 mg at 09/06/15 1735  . propranolol (INDERAL) tablet 80 mg  80 mg Oral BID Charm Rings, NP   80 mg at 09/07/15 0853  . traZODone (DESYREL) tablet 50 mg  50 mg Oral QHS Charm Rings, NP    50 mg at 09/06/15 2200  . vitamin C (ASCORBIC ACID) tablet 1,000 mg  1,000 mg Oral Daily Sanjuana Kava, NP   1,000 mg at 09/07/15 4782  . zolpidem (AMBIEN) tablet 5 mg  5 mg Oral QHS PRN Charm Rings, NP   5 mg at 09/06/15 2200    Lab Results: No results  found for this or any previous visit (from the past 48 hour(s)).  Blood Alcohol level:  Lab Results  Component Value Date   [facility] 231* 08/31/2015    Physical Findings: AIMS: Facial and Oral Movements Muscles of Facial Expression: None, normal Lips and Perioral Area: None, normal Jaw: None, normal Tongue: None, normal,Extremity Movements Upper (arms, wrists, hands, fingers): None, normal Lower (legs, knees, ankles, toes): None, normal, Trunk Movements Neck, shoulders, hips: None, normal, Overall Severity Severity of abnormal movements (highest score from questions above): None, normal Incapacitation due to abnormal movements: None, normal Patient's awareness of abnormal movements (rate only patient's report): No Awareness, Dental Status Current problems with teeth and/or dentures?: No Does patient usually wear dentures?: No  CIWA:  CIWA-Ar Total: 4 COWS:     Musculoskeletal: Strength & Muscle Tone: within normal limits Gait & Station: normal Patient leans: normal  Psychiatric Specialty Exam: Review of Systems  Constitutional: Positive for malaise/fatigue.  HENT: Negative.   Eyes: Negative.   Respiratory: Negative.   Cardiovascular: Negative.   Gastrointestinal: Negative.   Genitourinary: Negative.   Musculoskeletal: Positive for back pain and joint pain.  Skin: Negative.   Neurological: Positive for tremors and weakness.  Endo/Heme/Allergies: Negative.   Psychiatric/Behavioral: Positive for substance abuse. The patient is nervous/anxious.     Blood pressure 114/67, pulse 72, temperature 97.5 F (36.4 C), temperature source Oral, resp. rate 16, height  (1.854 m), weight 74.844 kg (165 lb), SpO2 99 %.Body mass  index is 21.77 kg/(m^2).  General Appearance: Fairly Groomed  Patent attorney::  Fair  Speech:  Clear and Coherent  Volume:  Decreased  Mood:  Anxious, Depressed and Dysphoric  Affect:  Restricted  Thought Process:  Coherent and Goal Directed  Orientation:  Full (Time, Place, and Person)  Thought Content:  symptoms events worries concerns  Suicidal Thoughts:  No  Homicidal Thoughts:  No  Memory:  Immediate;   Fair Recent;   Fair Remote;   Fair  Judgement:  Fair  Insight:  Present and Shallow  Psychomotor Activity:  Decreased  Concentration:  Fair  Recall:  [employer] of Knowledge:Fair  Language: Fair  Akathisia:  No  Handed:  Right  AIMS (if indicated):     Assets:  Desire for Improvement Housing Social Support  ADL's:  Intact  Cognition: WNL  Sleep:  Number of Hours: 6.75   Treatment Plan Summary: Daily contact with patient to assess and evaluate symptoms and progress in treatment and Medication management Supportive approach/coping skills Alcohol dependence; continue to work a relapse prevention plan Depression; continue the Lexapro and increase to 15 mg Will ask PT to evaluate due to his still having unstable gait Sleep continue the Ambien/Trazodone Marionette Meskill A, MD 09/07/2015, 2:15 PM

## 2015-09-07 NOTE — [facility] Group Notes (Signed)
[facility] LCSW Aftercare Discharge Planning Group Note  09/07/2015  8:45 AM  Participation Quality: Did Not Attend. Patient invited to participate but declined.  Dioselina Brumbaugh, MSW, LCSW Clinical Social Worker [facility] [phone]   

## 2015-09-07 NOTE — Progress Notes (Signed)
Patient ID: Thomas Mcneil, male   DOB: 09/17/1950, 65 y.o.   MRN: [id number] 1:1 notes  09/07/2015 @ 0600  D: Patient up getting to the restroom. Pt still tremorous. Denies pain. No sign of distress noted at this time A: 1:1 observation for safety R: Patient is safe. Sitter at bedside. 1:1 continues

## 2015-09-07 NOTE — Progress Notes (Signed)
PT Cancellation Note  Patient Details Name: Thomas Mcneil MRN: [id number] DOB: 02/17/1951   Cancelled Treatment:    Reason Eval/Treat Not Completed: Other (comment) (EPIC gives no indication that the patient went to ED. will follow up 4/1.)   Rada HayHill, Agatha Duplechain Elizabeth 09/07/2015, 4:10 PM Blanchard KelchKaren Sher Shampine PT [phone]

## 2015-09-07 NOTE — Progress Notes (Addendum)
1:1   1800  Patient continues to be 1:1 for safety.  Patient denied SI and HI, contracts for safety.  Denied A/V hallucinations.  Denied pain.  Respirations even and unlabored.  No signs/symptoms of pain/distress noted on patient's face or body movements.  1:1 continues for safety per MD order.

## 2015-09-07 NOTE — Progress Notes (Signed)
Patient ID: Thomas KosCarl E Vanwagner, male   DOB: 12/18/1950, 65 y.o.   MRN: [id number] Pt am CBG was 423. MD notified. Order to send pt to ED. EMS, ED and pt notified about transfer. Waiting for EMS arrival.

## 2015-09-07 NOTE — Progress Notes (Signed)
Patient ID: Thomas Mcneil, male   DOB: 02/24/1951, 65 y.o.   MRN: [id number] 1:1 notes  09/07/2015 @ 0200  D: Patient in bed sleeping. Respiration regular and unlabored. No sign of distress noted at this time A: 1:1 observation for safety R: Patient is asleep. Sitter at bedside. 1:1 continues

## 2015-09-07 NOTE — Tx Team (Signed)
Interdisciplinary Treatment Plan Update (Adult)  Date:  09/07/2015  Time Reviewed:  9:13 AM   Progress in Treatment: Attending groups: No.  Participating in groups:  No. Taking medication as prescribed:  Yes. Tolerating medication:  Yes. Family/Significant other contact made:  Yes, CSW has spoken with patient's sister Patient understands diagnosis:  Yes. AEB seeking treatment for SI, depression, alcohol/cocaine abuse, and for medication stabilization. Discussing patient identified problems/goals with staff:  Yes. Medical problems stabilized or resolved:  Yes. Denies suicidal/homicidal ideation: Yes. Issues/concerns per patient self-inventory:  Other:  Discharge Plan or Barriers: Patient plans to return to previous living situation to follow up with [facility].  Reason for Continuation of Hospitalization: Depression Medication stabilization Withdrawal symptoms  Comments:  Thomas Mcneil is an 65 y.o. male. Pt called 911 this morning and made reference to suicide. See previous notes: pt would not open door and behaved oddly, per EMS. Pt tells TTS that he has been feeling depressed and is not taking his psych meds, which he gets from [facility], because he has missed his appointments. Pt also reports that he has been drinking 1-3 fifths of wine/liquor daily for up to 2 1/2 years, along with weekly cocaine use. Pt reports he drinks enough that "I don't feel anything." Pt initially denied SI but then said "if I was suicidal, I wouldn't tell you."Pt was agitated when asked about guns and said he had guns but then said they were not at his house. Pt did report he gets the shakes when he stops drinking. TTS spoke with pt's mother who was with pt at [facility]. She reported that pt called her this morning as he normally does but did not report anything unusual. Pt then called her later in the day and told her that he was in the hospital. Pt has mentioned feeling depressed to his mother and she  has noticed that when she sees him he usually smells of alcohol. Pt has not made any mention of SI or HI to her. Diagnosis: depression, alcohol use  Estimated length of stay:  2-3 days   New goal(s): to develop effective aftercare plan.   Additional Comments:  Patient and CSW reviewed pt's identified goals and treatment plan. Patient verbalized understanding and agreed to treatment plan. CSW reviewed [facility] "Discharge Process and Patient Involvement" Form. Pt verbalized understanding of information provided and signed form.    Review of initial/current patient goals per problem list:  1. Goal(s): Patient will participate in aftercare plan  Met: Yes  Target date: at discharge  As evidenced by: Patient will participate within aftercare plan AEB aftercare provider and housing plan at discharge being identified.  3/28: CSW assessing for appropriate referrals.  3/31: Goal met. Patient plans to return to previous living situation to follow up with [facility].  2. Goal (s): Patient will exhibit decreased depressive symptoms and suicidal ideations.  Met: Goal progressing.   Target date: at discharge  As evidenced by: Patient will utilize self rating of depression at 3 or below and demonstrate decreased signs of depression or be deemed stable for discharge by MD.  3/28: Pt rates depression as high but currently denies SI/HI/AVH.   3/31: Goal progressing. Patient continues to isolate and does not participate in programming.   3. Goal(s): Patient will demonstrate decreased signs of withdrawal due to substance abuse  Met:No  Target date:at discharge   As evidenced by: Patient will produce a CIWA/COWS score of 0, have stable vitals signs, and no symptoms of  withdrawal.  3/28: Pt reports moderate/severe withdrawals with latest CIWA score of 11 and stable vitals.  3/31: Patient with CIWA score of 5, experiencing tremor and anxiety.   Attendees: Patient:   09/07/2015 9:13 AM    Family:   09/07/2015 9:13 AM   Physician:  Dr. Carlton Adam, MD 09/07/2015 9:13 AM   Nursing:  Soledad Gerlach Desma Paganini, RN 09/07/2015 9:13 AM   Clinical Social Worker: Maxie Better, LCSW 09/07/2015 9:13 AM   Clinical Social Worker: Erasmo Downer Alaila Pillard LCSW; Peri Maris LCSWA 09/07/2015 9:13 AM   Other:  Gerline Legacy Nurse Case Manager 09/07/2015 9:13 AM   Other:  Agustina Caroli, Samuel Jester, NP 09/07/2015 9:13 AM   Other:   09/07/2015 9:13 AM   Other: Norberto Sorenson, [place] 09/07/2015 9:13 AM   Other:  09/07/2015 9:13 AM   Other:  09/07/2015 9:13 AM     Tilden Fossa, LCSW Clinical Social Worker [facility] [phone]

## 2015-09-07 NOTE — Progress Notes (Signed)
PT Cancellation Note  Patient Details Name: Thomas Mcneil MRN: [id number] DOB: 11/08/1950   Cancelled Treatment:    Reason Eval/Treat Not Completed: Medical issues which prohibited therapy (note that patient is going to ED. will check back 4/1.)   Sharen HeckHill, Dorthy Magnussen Elizabeth Jaylanie Boschee PT [phone]  09/07/2015, 7:18 AM

## 2015-09-07 NOTE — Progress Notes (Signed)
D:Pt has been in his room much of the day. Pt has moderate tremors and has difficulty holding a cup or walking. Pt denies any other withdrawal symptoms. He rates depression as an 8 on 0-10 scale with 10 being the most. Pt reports that his biggest concern is being unsteady to walk. A:Offered support and 1:1 observation. R:Pt denies si and hi. Safety maintained on the unit.

## 2015-09-07 NOTE — Progress Notes (Signed)
Recreation Therapy Notes   Date: 03.31.2017 Time: 9:30am Location: 300 [address] Group Room   Group Topic: Stress Management  Goal Area(s) Addresses:  Patient will actively participate in stress management techniques presented during session.   Behavioral Response: Did not attend.   Marykay Lexenise L Brigg [place], LRT/CTRS        Thomas Mcneil L 09/07/2015 2:00 PM

## 2015-09-07 NOTE — [facility] Group Notes (Signed)
[facility] LCSW Group Therapy 09/07/2015  1:15 PM   Type of Therapy: Group Therapy  Participation Level: Did Not Attend. Patient invited to participate but declined.   [name], MSW, LCSW Clinical Social Worker [facility] [phone]

## 2015-09-07 NOTE — Progress Notes (Signed)
Patient attended wrap-up group. Rated his day good an 8. Stated his goal was to see physical therapist and that did not happen today which made him upset and disappointed.

## 2015-09-08 MED ORDER — ESCITALOPRAM OXALATE 10 MG PO TABS
15.0000 mg | ORAL_TABLET | Freq: Every day | ORAL | Status: DC
Start: 2015-09-09 — End: 2015-09-13
  Administered 2015-09-09 – 2015-09-13 (×5): 15 mg via ORAL
  Filled 2015-09-08 (×7): qty 1

## 2015-09-08 NOTE — [facility] Group Notes (Signed)
Adult Therapy Group Note - Clinical Social Work  Date:  09/08/2015 Time:  10:00-11:00AM  Group Topic/Focus:  Healthy Coping Skills  Additional Comments:  The main focus of today's process group was to discuss patient-specific behaviors that have prevented them from living the life they want.  The reasons underlying these behaviors were touched on lightly.  How to make a plan to promote learning how to use different behaviors was then explored fully.  Pt reported that the life he wants to live includes drinking in his house while enjoying television and other activities that give him fun.  He said the only thing preventing him from living the life he wants is his current health problems.  He was able to correlate those to his drinking and to see how the alcohol in fact is disruptive to his life.  He was a good participant in group with thoughtful answers.  Participation Level:  Active  Participation Quality:  Attentive and Sharing  Affect:  Blunted  Cognitive:  Appropriate  Insight: Appropriate  Engagement in Group:  Engaged  Modes of Intervention:  Clarification and Discussion  Sarina SerGrossman-Orr, Macen Joslin Jo 09/08/2015, 12:47 PM

## 2015-09-08 NOTE — Evaluation (Signed)
Physical Therapy Evaluation Patient Details Name: Thomas Mcneil MRN: [id number] DOB: 07-10-1950 Today's Date: 09/08/2015   History of Present Illness  Pt is 65 yo male admitted to [facility] for excessive alcohal consumption and suicidagl ideations.  He is also found to have peripheral neuropathy with unsteady gait and pain in knees   Clinical Impression  Pt was pleasant and cooperative with all challenges of PT eval and is motivated to improve his walking. Feel he will benefit from 5x per week PT at SNF to improved strength and balance for return to functional independence    Follow Up Recommendations SNF    Equipment Recommendations  Rolling walker with 5" wheels    Recommendations for Other Services       Precautions / Restrictions Precautions Precautions: Fall Restrictions Weight Bearing Restrictions: No      Mobility  Bed Mobility Overal bed mobility: Modified Independent             General bed mobility comments: extra time, difficlty initiating movement   Transfers Overall transfer level: Modified independent Equipment used: Standard walker             General transfer comment: needs supervision for safety,  moves slowly, has most diiffculty with descent to sitting   Ambulation/Gait Ambulation/Gait assistance: Min assist Ambulation Distance (Feet): 100 Feet (then 50) Assistive device: Rolling walker (2 wheeled) Gait Pattern/deviations: Step-through pattern;Decreased step length - right;Decreased step length - left;Decreased dorsiflexion - right;Decreased dorsiflexion - left;Decreased weight shift to right;Decreased weight shift to left;Shuffle Gait velocity: decreased Gait velocity interpretation: Below normal speed for age/gender General Gait Details: pt makes an effort to raise knees to lift feet, but visibly fatigues.  He has difficlty with turns and loses balance when he tries to step backwards.  He appears to be at risk to fall even with  RW  Information systems manager [place] (Stroke Patients Only)       Balance Overall balance assessment: Modified Independent                                           Pertinent Vitals/Pain Pain Assessment: 0-10 Pain Score: 8  Pain Location: feet    Home Living Family/patient expects to be discharged to:: Unsure (pt lived alone in apt prior to admission )                 Additional Comments: pt is agreeable to a skilled facility for continued PT     Prior Function Level of Independence: Independent         Comments: pt says he had trouble walking due to peripheral neuropathy and pain in knees      Hand Dominance        Extremity/Trunk Assessment   Upper Extremity Assessment: Overall WFL for tasks assessed           Lower Extremity Assessment: Generalized weakness         Communication   Communication: No difficulties  Cognition Arousal/Alertness: Awake/alert Behavior During Therapy: WFL for tasks assessed/performed Overall Cognitive Status: Within Functional Limits for tasks assessed                      General Comments General comments (skin integrity, edema, etc.): Pt appears to have diffuse muscle atrophy  and deconditioning     Exercises        Assessment/Plan    PT Assessment Patient needs continued PT services  PT Diagnosis Difficulty walking;Abnormality of gait;Generalized weakness   PT Problem List Decreased strength;Decreased activity tolerance;Decreased balance;Decreased mobility;Decreased knowledge of use of DME;Decreased safety awareness;Cardiopulmonary status limiting activity;Pain  PT Treatment Interventions DME instruction;Gait training;Functional mobility training;Therapeutic activities;Therapeutic exercise;Balance training;Neuromuscular re-education;Cognitive remediation   PT Goals (Current goals can be found in the Care Plan section) Acute Rehab PT Goals Patient  Stated Goal: to walk better PT Goal Formulation: With patient Time For Goal Achievement: 09/22/15 Potential to Achieve Goals: Good    Frequency Min 2X/week   Barriers to discharge Decreased caregiver support      Co-evaluation               End of Session Equipment Utilized During Treatment: Gait belt Activity Tolerance: Patient limited by fatigue Patient left: in bed;with nursing/sitter in room           Time: 1230-1250 PT Time Calculation (min) (ACUTE ONLY): 20 min   Charges:   PT Evaluation $PT Eval Low Complexity: 1 Procedure     PT G Codes:       Dondi Burandt K. Manson PasseyBrown, PT  09/08/2015, 1:14 PM

## 2015-09-08 NOTE — Progress Notes (Signed)
Pt at this time is in bed resting with eyes closed. Pt does not look to be in any distress at this time. 1:1 staff is present in room with Pt at this time. 1:1 monitoring continues for Pt's safety. 15-minute safety checks also continues at this time.  

## 2015-09-08 NOTE — Progress Notes (Signed)
Patient ID: Thomas Mcneil, male   DOB: 18-Oct-1950, 64 y.o.   MRN: [id number] [facility] MD Progress Note  09/08/2015 3:58 PM Thomas Mcneil  MRN:  [id number]  Subjective:  Thomas Mcneil continues to have a hard time. He is still having the tremors and is unstable on his feet. Says his mood is improving some. Continue to present with tremors & generalized weakness. [facility] He is currently working with Physical therapy due to lower extremity weakness. Thomas Mcneil continue to require 1:1 supervision for safety. He currently denies any SIHI, AVH. Increased Lexapro from 10 mg to 15 mg daily for depression.  Principal Problem: Severe recurrent major depression without psychotic features (HCC)  Diagnosis:   Patient Active Problem List   Diagnosis Date Noted  . Alcohol use disorder, severe, dependence (HCC) [F10.20] 09/04/2015  . Severe recurrent major depression without psychotic features (HCC) [F33.2] 09/04/2015  . Alcohol dependence, uncomplicated (HCC) [F10.20] 09/01/2015  . Alcohol-induced mood disorder (HCC) [F10.94] 09/01/2015  . Cocaine abuse [F14.10] 09/01/2015  . Alcohol abuse [F10.10] 04/05/2014  . Essential tremor [G25.0] 01/04/2014  . Other and unspecified hyperlipidemia [E78.5] 01/04/2014  . B12 deficiency [E53.8] 01/04/2014  . Melena [K92.1] 10/23/2012  . GERD [K21.9] 05/27/2010  . SHOULDER PAIN, LEFT [M25.519] 05/21/2010  . ONYCHOMYCOSIS, TOENAILS [B35.1] 05/09/2010  . KNEE PAIN, BILATERAL [M25.569] 06/27/2009  . LEG CRAMPS [R25.2] 06/27/2009  . Chronic hepatitis C without hepatic coma (HCC) [B18.2] 09/05/2008  . CONTUSION OF UNSPECIFIED SITE [T14.8] 08/18/2008  . LEG EDEMA, BILATERAL [R60.9] 01/13/2008  . ERECTILE DYSFUNCTION [F52.8] 10/15/2007  . TOBACCO ABUSE [F17.200] 08/16/2007  . Mononeuritis of lower limb [G57.90] 08/16/2007  . CORNS AND CALLUSES [L84] 08/16/2007  . ALCOHOL ABUSE, HX OF [F10.21] 08/16/2007   Total Time spent with patient: 15 minutes  Past Psychiatric  History: See admission H and P  Past Medical History:  Past Medical History  Diagnosis Date  . Depression   . GERD (gastroesophageal reflux disease)   . Complication of anesthesia     slow to wake up after last surgery  . Peripheral neuropathy (HCC)     feet and hands  . Arthritis   . Hepatitis     hep c  . High cholesterol   . High blood pressure   . Neuropathy [facility])     Past Surgical History  Procedure Laterality Date  . Tonsillectomy    . Direct laryngoscopy  12/26/2011    Procedure: DIRECT LARYNGOSCOPY;  Surgeon: Melvenia Beam, MD;  Location: [facility] OR;  Service: ENT;  Laterality: N/A;  Directy Laryngoscopy with biopsy [phone]  . Direct laryngoscopy N/A 10/21/2012    Procedure: DIRECT LARYNGOSCOPY;  Surgeon: Melvenia Beam, MD;  Location: [facility] OR;  Service: ENT;  Laterality: N/A;  . Esophagogastroduodenoscopy N/A 10/25/2012    Procedure: ESOPHAGOGASTRODUODENOSCOPY (EGD);  Surgeon: Charna Elizabeth, MD;  Location: [facility] ENDOSCOPY;  Service: Endoscopy;  Laterality: N/A;   Family History:  Family History  Problem Relation Age of Onset  . Hypertension Mother   . Diverticulosis Mother   . GER disease Mother   . Lung disease Sister   . Polymyositis Sister    Family Psychiatric  History: See admission H and P  Social History:  History  Alcohol Use  . 42.0 oz/week  . 0 Standard drinks or equivalent, 70 Cans of beer per week    Comment: [place] - currently in [facility] for counseling/rehab. last drink 06/29/14     History  Drug Use  .  0.20 per week    Comment: "Crack" quit 06/29/14    Social History   Social History  . Marital Status: Widowed    Spouse Name: N/A  . Number of Children: N/A  . Years of Education: N/A   Social History Main Topics  . Smoking status: Current Some Day Smoker -- 0.25 packs/day for 45 years    Types: Cigarettes    Start date: 06/09/2012  . Smokeless tobacco: None     Comment: quitting, using e-cig  . Alcohol Use: 42.0 oz/week    0 Standard drinks or  equivalent, 70 Cans of beer per week     Comment: [place] - currently in [facility] for counseling/rehab. last drink 06/29/14  . Drug Use: 0.20 per week     Comment: "Crack" quit 06/29/14  . Sexual Activity: Not Asked   Other Topics Concern  . None   Social History Narrative   Additional Social History:   Sleep: Good  Appetite:  Fair  Current Medications: Current Facility-Administered Medications  Medication Dose Route Frequency Provider Last Rate Last Dose  . acetaminophen (TYLENOL) tablet 650 mg  650 mg Oral Q6H PRN Thermon Leyland, NP      . alum & mag hydroxide-simeth (MAALOX/MYLANTA) 200-200-20 MG/5ML suspension 30 mL  30 mL Oral Q4H PRN Thermon Leyland, NP      . escitalopram (LEXAPRO) tablet 10 mg  10 mg Oral Daily Thermon Leyland, NP   10 mg at 09/08/15 0834  . feeding supplement (ENSURE ENLIVE) (ENSURE ENLIVE) liquid 237 mL  237 mL Oral BID BM Thermon Leyland, NP   237 mL at 09/08/15 1000  . fluticasone (FLONASE) 50 MCG/ACT nasal spray 2 spray  2 spray Each Nare Daily PRN Charm Rings, NP      . gabapentin (NEURONTIN) capsule 300 mg  300 mg Oral TID Rachael Fee, MD   300 mg at 09/08/15 1157  . ibuprofen (ADVIL,MOTRIN) tablet 600 mg  600 mg Oral Q8H PRN Charm Rings, NP   600 mg at 09/06/15 2159  . magnesium hydroxide (MILK OF MAGNESIA) suspension 30 mL  30 mL Oral Daily PRN Thermon Leyland, NP      . multivitamin with minerals tablet 1 tablet  1 tablet Oral Daily Adonis Brook, NP   1 tablet at 09/08/15 0834  . nicotine (NICODERM CQ - dosed in mg/24 hours) patch 21 mg  21 mg Transdermal Daily Charm Rings, NP   21 mg at 09/08/15 1610  . ondansetron (ZOFRAN) tablet 4 mg  4 mg Oral Q8H PRN Charm Rings, NP      . pantoprazole (PROTONIX) EC tablet 40 mg  40 mg Oral Daily Charm Rings, NP   40 mg at 09/08/15 0835  . pneumococcal 23 valent vaccine (PNU-IMMUNE) injection 0.5 mL  0.5 mL Intramuscular Tomorrow-1000 Thermon Leyland, NP   0.5 mL at 09/03/15 1517  . pravastatin (PRAVACHOL)  tablet 40 mg  40 mg Oral q1800 Charm Rings, NP   40 mg at 09/07/15 1727  . propranolol (INDERAL) tablet 80 mg  80 mg Oral BID Charm Rings, NP   80 mg at 09/08/15 0834  . traZODone (DESYREL) tablet 50 mg  50 mg Oral QHS Charm Rings, NP   50 mg at 09/07/15 2121  . vitamin C (ASCORBIC ACID) tablet 1,000 mg  1,000 mg Oral Daily Sanjuana Kava, NP   1,000 mg at 09/08/15 0834  . zolpidem (  AMBIEN) tablet 5 mg  5 mg Oral QHS PRN Charm RingsJamison Y Lord, NP   5 mg at 09/07/15 2121    Lab Results: No results found for this or any previous visit (from the past 48 hour(s)).  Blood Alcohol level:  Lab Results  Component Value Date   [facility]2015    Physical Findings: AIMS: Facial and Oral Movements Muscles of Facial Expression: None, normal Lips and Perioral Area: None, normal Jaw: None, normal Tongue: None, normal,Extremity Movements Upper (arms, wrists, hands, fingers): None, normal Lower (legs, knees, ankles, toes): None, normal, Trunk Movements Neck, shoulders, hips: None, normal, Overall Severity Severity of abnormal movements (highest score from questions above): None, normal Incapacitation due to abnormal movements: None, normal Patient's awareness of abnormal movements (rate only patient's report): No Awareness, Dental Status Current problems with teeth and/or dentures?: No Does patient usually wear dentures?: No  CIWA:  CIWA-Ar Total: 2 COWS:     Musculoskeletal: Strength & Muscle Tone: within normal limits Gait & Station: normal Patient leans: normal  Psychiatric Specialty Exam: Review of Systems  Constitutional: Positive for malaise/fatigue.  HENT: Negative.   Eyes: Negative.   Respiratory: Negative.   Cardiovascular: Negative.   Gastrointestinal: Negative.   Genitourinary: Negative.   Musculoskeletal: Positive for back pain and joint pain.  Skin: Negative.   Neurological: Positive for tremors and weakness.  Endo/Heme/Allergies: Negative.    Psychiatric/Behavioral: Positive for substance abuse. The patient is nervous/anxious.     Blood pressure 118/80, pulse 72, temperature 97.7 F (36.5 C), temperature source Oral, resp. rate 16, height 6\' 1"  (1.854 m), weight 74.844 kg (165 lb), SpO2 99 %.Body mass index is 21.77 kg/(m^2).  General Appearance: Fairly Groomed  Patent attorneyye Contact::  Fair  Speech:  Clear and Coherent  Volume:  Decreased  Mood:  Anxious, Depressed and Dysphoric  Affect:  Restricted  Thought Process:  Coherent and Goal Directed  Orientation:  Full (Time, Place, and Person)  Thought Content:  symptoms events worries concerns  Suicidal Thoughts:  No  Homicidal Thoughts:  No  Memory:  Immediate;   Fair Recent;   Fair Remote;   Fair  Judgement:  Fair  Insight:  Present and Shallow  Psychomotor Activity:  Decreased  Concentration:  Fair  Recall:  FiservFair  Fund of Knowledge:Fair  Language: Fair  Akathisia:  No  Handed:  Right  AIMS (if indicated):     Assets:  Desire for Improvement Housing Social Support  ADL's:  Intact  Cognition: WNL  Sleep:  Number of Hours: 6   Treatment Plan Summary: Daily contact with patient to assess and evaluate symptoms and progress in treatment and Medication management Supportive approach/coping skills Alcohol dependence; continue to work a relapse prevention plan Depression; continue the Lexapro and increase to 15 mg. PT working with patient for gait training & endurance. Sleep continue the Ambien/Trazodone. Safety: Continue 1:1 supervision.  Armandina StammerNwoko, Agnes I, NP, PMHNP-BC 09/08/2015, 3:58 PM

## 2015-09-08 NOTE — Progress Notes (Signed)
D) Pt has been in his room. Sat up for his meal and was quite pleasant with interaction. Affect is appropriate. Mood appropriate. Pt denies SI and HI, delusions and hallucinations. Pt has rested a good part of the afternoon. A) Given support, reassurance and praise.  R) Denies SI and HI. Remains safe.

## 2015-09-08 NOTE — Progress Notes (Signed)
1:1 Note  Pt at this time is in dayroom keeping to himself. Pt endorses moderate depression; states, "I need to look for a way to fix myself; this doesn't make sense anymore." Pt was med compliant, attended group and have been very pleasant. 1:1 staff is present and sited within arms' length from Pt. 1:1 monitoring continues for Pt's safety.

## 2015-09-08 NOTE — Progress Notes (Addendum)
RN 1:1 note  D: Patient is resting in his room  A: Will continue 1:1 monitoring for fall risk  R: Respirations are even and unlabored, no urgent needs at this time

## 2015-09-08 NOTE — Progress Notes (Signed)
RN 1:1 NOTE  D:Patient reports that he slept well and denying pain at this time, patient denies SI/HI  And any other concerns at this time  A: Monitored q 15 minutes; patient encouraged to attend groups; patient educated about medications; patient given medications per physician orders; patient encouraged to express feelings and/or concerns, patient informed that PT is coming to evaluate today  R: Patient is flat but conversation he is appropriate to situations, patient's interaction with staff and peers is minimal; patient was able to set goal to talk with staff 1:1 when having feelings of SI; patient is taking medications as prescribed and tolerating medications; patient is attending all groups

## 2015-09-08 NOTE — Progress Notes (Signed)
Patient did attend the evening speaker AA meeting.  

## 2015-09-09 MED ORDER — TRAZODONE HCL 100 MG PO TABS
200.0000 mg | ORAL_TABLET | Freq: Every day | ORAL | Status: DC
  Administered 2015-09-09 – 2015-09-12 (×4): 200 mg via ORAL
  Filled 2015-09-09 (×7): qty 2

## 2015-09-09 NOTE — [facility] Group Notes (Signed)
[facility] Group Notes:  (Clinical Social Work)  09/09/2015  10:00-11:00AM  Summary of Progress/Problems:   The main focus of today's process group was to   1)  discuss the importance of adding supports  2)  define health supports versus unhealthy supports  3)  identify the patient's current unhealthy supports and plan how to handle them  4)  Identify the patient's current healthy supports and plan what to add.  An emphasis was placed on using counselor, doctor, therapy groups, 12-step groups, and problem-specific support groups to expand supports.    The patient expressed full comprehension of the concepts presented, and agreed that there is a need to add more supports.  The patient stated his best friend is also an enabler, because he will go get alcohol for pt if pt is unable to do so himself.  He was willing to think about how to address this with his best friend.  Type of Therapy:  Process Group with Motivational Interviewing  Participation Level:  Active  Participation Quality:  Attentive, Sharing and Supportive  Affect:  Blunted  Cognitive:  Appropriate and Oriented  Insight:  Engaged  Engagement in Therapy:  Engaged  Modes of Intervention:   Education, Support and Processing, Activity  Ambrose MantleMareida Grossman-Orr, LCSW 09/09/2015

## 2015-09-09 NOTE — Progress Notes (Signed)
Patient ID: Thomas Mcneil, male   DOB: 03/11/1951, 65 y.o.   MRN: [id number] Adult Psychoeducational Group Note  Date:  09/09/2015 Time: 01:45 pm  Group Topic/Focus:  Identifying Needs:   The focus of this group is to help patients identify their personal needs that have been historically problematic and identify healthy behaviors to address their needs.  Participation Level:  Active  Participation Quality:  Attentive  Affect:  Appropriate  Cognitive:  Appropriate  Insight: Appropriate  Engagement in Group:  Improving  Modes of Intervention:  Activity, Discussion, Education and Support  Additional Comments:  Pt able to identify a need for himself was having self respect.   Aurora Maskwyman, Eliette Drumwright E 09/09/2015, 2:10 PM

## 2015-09-09 NOTE — Progress Notes (Signed)
Pt is very unsteady on his feet   He uses a walker to ambulate and lost his balance when he leaned his head back to drink water with his medications    Pt is on a 1:1 for safety  And is safe at present

## 2015-09-09 NOTE — Progress Notes (Signed)
1:1 Note  D: Patient is observed resting quietly in bed, respirations even and unlabored, color satisfactory. No complaints.  A: 1:1 continued for patient safety.  R: Safety maintained on unit. 

## 2015-09-09 NOTE — Progress Notes (Signed)
1:1 Note  D) Pt has been up and in the milieu today. Has attended and participated in the groups. Affect and mood are appropriate. Denies SI and HI. Has been sitting in the dayroom enjoying and interacting with his peers. A) Given support, reassurance and praise. Encouragement provided. R) Denies SI and HI. Remains safe.

## 2015-09-09 NOTE — Progress Notes (Signed)
1:1 Note  D) Pt just took a shower and decided to go into group. Is participating. Affect brightens with interaction. Pt states that he is feeling depressed, rating his depression at a 9, hopelessness at a 9 and his anxiety at a 9. Denies SI and HI. Has been in the wheelchair and is helping push himself around. A) Given support, reassurance and praise. Provided with a 1:1.  R) Pt remains on his 1:1 for his safety.

## 2015-09-09 NOTE — Progress Notes (Signed)
Patient did attend the evening speaker AA meeting.  

## 2015-09-09 NOTE — Progress Notes (Signed)
Pt did not sleep last night at all  He had 2 sleeping pills one being a hypnotic    He is very tremulous and has an unsteady gait   He is considered a very high fall risk   Pt is on a 1:1 for safety  And remains safe

## 2015-09-09 NOTE — Progress Notes (Signed)
1:1 Note  D) Pt in the dayroom eating dinner. Affect and mood are appropriate. Denies SI and HI. A) Given support and reassurance R) Remains safe.

## 2015-09-09 NOTE — Progress Notes (Addendum)
Patient ID: Thomas Mcneil, male   DOB: 01/25/1951, 65 y.o.   MRN: [id number] Patient ID: Thomas Mcneil, male   DOB: 01/07/1951, 65 y.o.   MRN: [id number] [facility]  09/09/2015 4:26 PM Thomas Mcneil  MRN:  [id number]  Subjective:  Thomas Mcneil continues to have a hard time. He says he is not feeling well because he slept very poorly last night. He remains on 1:1 supervision for safety. Continues to work with the physical therapist 2 times weekly for gait training, balance & endurance. He says his mind wanders a lot at night time. Currently denies any SIHI, AVH.Marland Kitchen.  Principal Problem: Severe recurrent major depression without psychotic features (HCC)  Diagnosis:   Patient Active Problem List   Diagnosis Date Noted  . Alcohol use disorder, severe, dependence (HCC) [F10.20] 09/04/2015  . Severe recurrent major depression without psychotic features (HCC) [F33.2] 09/04/2015  . Alcohol dependence, uncomplicated (HCC) [F10.20] 09/01/2015  . Alcohol-induced mood disorder (HCC) [F10.94] 09/01/2015  . Cocaine abuse [F14.10] 09/01/2015  . Alcohol abuse [F10.10] 04/05/2014  . Essential tremor [G25.0] 01/04/2014  . Other and unspecified hyperlipidemia [E78.5] 01/04/2014  . B12 deficiency [E53.8] 01/04/2014  . Melena [K92.1] 10/23/2012  . GERD [K21.9] 05/27/2010  . SHOULDER PAIN, LEFT [M25.519] 05/21/2010  . ONYCHOMYCOSIS, TOENAILS [B35.1] 05/09/2010  . KNEE PAIN, BILATERAL [M25.569] 06/27/2009  . LEG CRAMPS [R25.2] 06/27/2009  . Chronic hepatitis C without hepatic coma (HCC) [B18.2] 09/05/2008  . CONTUSION OF UNSPECIFIED SITE [T14.8] 08/18/2008  . LEG EDEMA, BILATERAL [R60.9] 01/13/2008  . ERECTILE DYSFUNCTION [F52.8] 10/15/2007  . TOBACCO ABUSE [F17.200] 08/16/2007  . Mononeuritis of lower limb [G57.90] 08/16/2007  . CORNS AND CALLUSES [L84] 08/16/2007  . ALCOHOL ABUSE, HX OF [F10.21] 08/16/2007   Total Time spent with patient: 15 minutes  Past Psychiatric History: See admission H and P  Past  Medical History:  Past Medical History  Diagnosis Date  . Depression   . GERD (gastroesophageal reflux disease)   . Complication of anesthesia     slow to wake up after last surgery  . Peripheral neuropathy (HCC)     feet and hands  . Arthritis   . Hepatitis     hep c  . High cholesterol   . High blood pressure   . Neuropathy [facility](HCC)     Past Surgical History  Procedure Laterality Date  . Tonsillectomy    . Direct laryngoscopy  12/26/2011    Procedure: DIRECT LARYNGOSCOPY;  Surgeon: Melvenia BeamMitchell Gore, MD;  [facility];  Service: ENT;  Laterality: N/A;  Directy Laryngoscopy with biopsy [phone]  . Direct laryngoscopy N/A 10/21/2012    Procedure: DIRECT LARYNGOSCOPY;  Surgeon: Melvenia BeamMitchell Gore, MD;  [facility];  Service: ENT;  Laterality: N/A;  . Esophagogastroduodenoscopy N/A 10/25/2012    Procedure: ESOPHAGOGASTRODUODENOSCOPY (EGD);  Surgeon: Charna ElizabethJyothi Mann, MD;  [facility];  Service: Endoscopy;  Laterality: N/A;   Family History:  Family History  Problem Relation Age of Onset  . Hypertension Mother   . Diverticulosis Mother   . GER disease Mother   . Lung disease Sister   . Polymyositis Sister    Family Psychiatric  History: See admission H and P  Social History:  History  Alcohol Use  . 42.0 oz/week  . 0 Standard drinks or equivalent, 70 Cans of beer per week    Comment: Relaspe - currently in [facility] for counseling/rehab. last drink 06/29/14     History  Drug Use  . 0.20  per week    Comment: "Crack" quit 06/29/14    Social History   Social History  . Marital Status: Widowed    Spouse Name: N/A  . Number of Children: N/A  . Years of Education: N/A   Social History Main Topics  . Smoking status: Current Some Day Smoker -- 0.25 packs/day for 45 years    Types: Cigarettes    Start date: 06/09/2012  . Smokeless tobacco: None     Comment: quitting, using e-cig  . Alcohol Use: 42.0 oz/week    0 Standard drinks or equivalent, 70 Cans of beer per week      Comment: [employer] - currently in [facility] for counseling/rehab. last drink 06/29/14  . Drug Use: 0.20 per week     Comment: "Crack" quit 06/29/14  . Sexual Activity: Not Asked   Other Topics Concern  . None   Social History Narrative   Additional Social History:   Sleep: Good  Appetite:  Fair  Current Medications: Current Facility-Administered Medications  Medication Dose Route Frequency Provider Last Rate Last Dose  . acetaminophen (TYLENOL) tablet 650 mg  650 mg Oral Q6H PRN Thermon Leyland, NP      . alum & mag hydroxide-simeth (MAALOX/MYLANTA) 200-200-20 MG/5ML suspension 30 mL  30 mL Oral Q4H PRN Thermon Leyland, NP      . escitalopram (LEXAPRO) tablet 15 mg  15 mg Oral Daily Sanjuana Kava, NP   15 mg at 09/09/15 0816  . feeding supplement (ENSURE ENLIVE) (ENSURE ENLIVE) liquid 237 mL  237 mL Oral BID BM Thermon Leyland, NP   237 mL at 09/09/15 1009  . fluticasone (FLONASE) 50 MCG/ACT nasal spray 2 spray  2 spray Each Nare Daily PRN Charm Rings, NP      . gabapentin (NEURONTIN) capsule 300 mg  300 mg Oral TID Rachael Fee, MD   300 mg at 09/09/15 1200  . ibuprofen (ADVIL,MOTRIN) tablet 600 mg  600 mg Oral Q8H PRN Charm Rings, NP   600 mg at 09/06/15 2159  . magnesium hydroxide (MILK OF MAGNESIA) suspension 30 mL  30 mL Oral Daily PRN Thermon Leyland, NP      . multivitamin with minerals tablet 1 tablet  1 tablet Oral Daily Adonis Brook, NP   1 tablet at 09/09/15 0817  . nicotine (NICODERM CQ - dosed in mg/24 hours) patch 21 mg  21 mg Transdermal Daily Charm Rings, NP   21 mg at 09/09/15 0817  . ondansetron (ZOFRAN) tablet 4 mg  4 mg Oral Q8H PRN Charm Rings, NP      . pantoprazole (PROTONIX) EC tablet 40 mg  40 mg Oral Daily Charm Rings, NP   40 mg at 09/09/15 0817  . pneumococcal 23 valent vaccine (PNU-IMMUNE) injection 0.5 mL  0.5 mL Intramuscular Tomorrow-1000 Thermon Leyland, NP   0.5 mL at 09/03/15 1517  . pravastatin (PRAVACHOL) tablet 40 mg  40 mg Oral q1800 Charm Rings, NP   40 mg at 09/08/15 1729  . propranolol (INDERAL) tablet 80 mg  80 mg Oral BID Charm Rings, NP   80 mg at 09/09/15 0816  . traZODone (DESYREL) tablet 50 mg  50 mg Oral QHS Charm Rings, NP   50 mg at 09/08/15 2154  . vitamin C (ASCORBIC ACID) tablet 1,000 mg  1,000 mg Oral Daily Sanjuana Kava, NP   1,000 mg at 09/09/15 0817  . zolpidem (AMBIEN)  tablet 5 mg  5 mg Oral QHS PRN Charm Rings, NP   5 mg at 09/08/15 2154    Lab Results: No results found for this or any previous visit (from the past 48 hour(s)).  Blood Alcohol level:  Lab Results  Component Value Date   [facility] 231* 08/31/2015    Physical Findings: AIMS: Facial and Oral Movements Muscles of Facial Expression: None, normal Lips and Perioral Area: None, normal Jaw: None, normal Tongue: None, normal,Extremity Movements Upper (arms, wrists, hands, fingers): None, normal Lower (legs, knees, ankles, toes): None, normal, Trunk Movements Neck, shoulders, hips: None, normal, Overall Severity Severity of abnormal movements (highest score from questions above): None, normal Incapacitation due to abnormal movements: None, normal Patient's awareness of abnormal movements (rate only patient's report): No Awareness, Dental Status Current problems with teeth and/or dentures?: No Does patient usually wear dentures?: No  CIWA:  CIWA-Ar Total: 9 COWS:     Musculoskeletal: Strength & Muscle Tone: within normal limits Gait & Station: normal Patient leans: normal  Psychiatric Specialty Exam: Review of Systems  Constitutional: Positive for malaise/fatigue.  HENT: Negative.   Eyes: Negative.   Respiratory: Negative.   Cardiovascular: Negative.   Gastrointestinal: Negative.   Genitourinary: Negative.   Musculoskeletal: Positive for back pain and joint pain.  Skin: Negative.   Neurological: Positive for tremors and weakness.  Endo/Heme/Allergies: Negative.   Psychiatric/Behavioral: Positive for substance abuse. The  patient is nervous/anxious.     Blood pressure 103/64, pulse 105, temperature 98.2 F (36.8 C), temperature source Oral, resp. rate 17, height  (1.854 m), weight 74.844 kg (165 lb), SpO2 99 %.Body mass index is 21.77 kg/(m^2).  General Appearance: Fairly Groomed  Patent attorney::  Fair  Speech:  Clear and Coherent  Volume:  Decreased  Mood:  Anxious, Depressed and Dysphoric  Affect:  Restricted  Thought Process:  Coherent and Goal Directed  Orientation:  Full (Time, Place, and Person)  Thought Content:  symptoms events worries concerns  Suicidal Thoughts:  No  Homicidal Thoughts:  No  Memory:  Immediate;   Fair Recent;   Fair Remote;   Fair  Judgement:  Fair  Insight:  Present and Shallow  Psychomotor Activity:  Decreased  Concentration:  Fair  Recall:  [employer] of Knowledge:Fair  Language: Fair  Akathisia:  No  Handed:  Right  AIMS (if indicated):     Assets:  Desire for Improvement Housing Social Support  ADL's:  Intact  Cognition: WNL  Sleep:  Number of Hours: 0   Treatment Plan Summary: Daily contact with patient to assess and evaluate symptoms and progress in treatment and Medication management Supportive approach/coping skills Alcohol dependence; continue to work a relapse prevention plan Depression; continue the Lexapro and increase to 15 mg. PT working with patient for gait training & endurance. Sleep continue the Ambien/ increased Trazodone 200 mg q hs.  Safety: Continue 1:1 supervision safety. Continue current plan of care  Sanjuana Kava, NP, PMHNP-BC 09/09/2015, 4:26 PM

## 2015-09-09 NOTE — Progress Notes (Signed)
D- Patient was observed in the milieu.  Depressed mood with flat affect.  Denies SI, HI, AVH, and pain.  No complaints. A- Scheduled medications administered to patient, per MD orders. Support and encouragement provided.  Routine safety checks conducted every 15 minutes.  Patient informed to notify staff with problems or concerns. R- Patient compliant with medications and treatment plan. Patient receptive, calm, and cooperative. Patient remains safe at this time.

## 2015-09-09 NOTE — Progress Notes (Signed)
Pt is having a hard time sleeping tonight   He has requested more medication but has received all that has been ordered for him   With his weakness and unsteady gait and poor balance  Staff reluctant to call and request more medication   Advised pt to talk with doctor tomorrow   Pt on 1:1 and is presently safe

## 2015-09-10 NOTE — [facility] Group Notes (Signed)
[facility] Note   09/10/2015 10:22 AM  Participation Quality: Invited. DID NOT ATTEND. Pt chose to rest in room.   Smart, Diarra Ceja LCSW

## 2015-09-10 NOTE — Progress Notes (Signed)
1:1 1900  Patient continues to sit in dayroom with 1:1 present for safety.  Patient denied SI and HI, contracts for safety.  Patient denied A/V hallucinations.  Patient denied pain.  Respirations even and unlabored.  No signs/symptoms of pain/distress noted on patient's face/body movements.  Emotional support and encouragement given patient.  Patient continues to use walker to ambulate.  1:1 continues per MD order for safety.

## 2015-09-10 NOTE — Progress Notes (Signed)
D: Patient asleep with no s/s of distress noted at this time.  A: Patient remains on a 1:1 observation for safety. R: No needs currently. 

## 2015-09-10 NOTE — [facility] Group Notes (Signed)
[facility] LCSW Group Therapy  09/10/2015 1:01 PM  Type of Therapy:  Group Therapy  Participation Level:  Active  Participation Quality:  Attentive  Affect:  Appropriate  Cognitive:  Alert and Oriented  Insight:  Improving  Engagement in Therapy:  Improving  Modes of Intervention:  Confrontation, Discussion, Education, Exploration, Problem-solving, Rapport Building, Socialization and Support  Summary of Progress/Problems: Today's Topic: Overcoming Obstacles. Patients identified one short term goal and potential obstacles in reaching this goal. Patients processed barriers involved in overcoming these obstacles. Patients identified steps necessary for overcoming these obstacles and explored motivation (internal and external) for facing these difficulties head on. Thomas Mcneil was attentive and engaged during today's processing group. He shared that his biggest obstacle is learning how to cope with his physical limitations and the uncertainty of whether he will regain full function of his legs. "this latest episode really scared me and made me realize that I have to stop living my life this way and stop with the drinking." Thomas Mcneil shared that his sister and elderly mother are supportive of him and are willing to help him care for himself until he gets back on his feet.   Smart, Skylar Priest LCSW 09/10/2015, 1:01 PM

## 2015-09-10 NOTE — Plan of Care (Signed)
Problem: Consults Goal: Suicide Risk Patient Education (See Patient Education module for education specifics)  Outcome: Progressing Nurse discussed suicidal thoughts/ depression/ coping skills with patient.        

## 2015-09-10 NOTE — Progress Notes (Signed)
[facility] MD Progress Note  09/10/2015 4:56 PM Thomas Mcneil  MRN:  [id number] Subjective:  Thomas Mcneil states he is still not feeling right. He has the tremor as well as the neuropathic pain. He states he is afraid he could fall at home where he lives alone. States it has never been this bad before. He is worried Principal Problem: Severe recurrent major depression without psychotic features (HCC) Diagnosis:   Patient Active Problem List   Diagnosis Date Noted  . Alcohol use disorder, severe, dependence (HCC) [F10.20] 09/04/2015  . Severe recurrent major depression without psychotic features (HCC) [F33.2] 09/04/2015  . Alcohol dependence, uncomplicated (HCC) [F10.20] 09/01/2015  . Alcohol-induced mood disorder (HCC) [F10.94] 09/01/2015  . Cocaine abuse [F14.10] 09/01/2015  . Alcohol abuse [F10.10] 04/05/2014  . Essential tremor [G25.0] 01/04/2014  . Other and unspecified hyperlipidemia [E78.5] 01/04/2014  . B12 deficiency [E53.8] 01/04/2014  . Melena [K92.1] 10/23/2012  . GERD [K21.9] 05/27/2010  . SHOULDER PAIN, LEFT [M25.519] 05/21/2010  . ONYCHOMYCOSIS, TOENAILS [B35.1] 05/09/2010  . KNEE PAIN, BILATERAL [M25.569] 06/27/2009  . LEG CRAMPS [R25.2] 06/27/2009  . Chronic hepatitis C without hepatic coma (HCC) [B18.2] 09/05/2008  . CONTUSION OF UNSPECIFIED SITE [T14.8] 08/18/2008  . LEG EDEMA, BILATERAL [R60.9] 01/13/2008  . ERECTILE DYSFUNCTION [F52.8] 10/15/2007  . TOBACCO ABUSE [F17.200] 08/16/2007  . Mononeuritis of lower limb [G57.90] 08/16/2007  . CORNS AND CALLUSES [L84] 08/16/2007  . ALCOHOL ABUSE, HX OF [F10.21] 08/16/2007   Total Time spent with patient: 20 minutes  Past Psychiatric History: see admission H and P  Past Medical History:  Past Medical History  Diagnosis Date  . Depression   . GERD (gastroesophageal reflux disease)   . Complication of anesthesia     slow to wake up after last surgery  . Peripheral neuropathy (HCC)     feet and hands  . Arthritis   . Hepatitis      hep c  . High cholesterol   . High blood pressure   . Neuropathy [facility])     Past Surgical History  Procedure Laterality Date  . Tonsillectomy    . Direct laryngoscopy  12/26/2011    Procedure: DIRECT LARYNGOSCOPY;  Surgeon: Melvenia Beam, MD;  Location: [facility] OR;  Service: ENT;  Laterality: N/A;  Directy Laryngoscopy with biopsy [phone]  . Direct laryngoscopy N/A 10/21/2012    Procedure: DIRECT LARYNGOSCOPY;  Surgeon: Melvenia Beam, MD;  Location: [facility] OR;  Service: ENT;  Laterality: N/A;  . Esophagogastroduodenoscopy N/A 10/25/2012    Procedure: ESOPHAGOGASTRODUODENOSCOPY (EGD);  Surgeon: Charna Elizabeth, MD;  Location: [facility] ENDOSCOPY;  Service: Endoscopy;  Laterality: N/A;   Family History:  Family History  Problem Relation Age of Onset  . Hypertension Mother   . Diverticulosis Mother   . GER disease Mother   . Lung disease Sister   . Polymyositis Sister    Family Psychiatric  History: see admission H and P Social History:  History  Alcohol Use  . 42.0 oz/week  . 0 Standard drinks or equivalent, 70 Cans of beer per week    Comment: [employer]as[employer] - currently in [facility] for counseling/rehab. last drink 06/29/14     History  Drug Use  . 0.20 per week    Comment: "Crack" quit 06/29/14    Social History   Social History  . Marital Status: Widowed    Spouse Name: N/A  . Number of Children: N/A  . Years of Education: N/A   Social History Main Topics  . Smoking status: Current  Some Day Smoker -- 0.25 packs/day for 45 years    Types: Cigarettes    Start date: 06/09/2012  . Smokeless tobacco: None     Comment: quitting, using e-cig  . Alcohol Use: 42.0 oz/week    0 Standard drinks or equivalent, 70 Cans of beer per week     Comment: [employer]as[employer] - currently in [facility] for counseling/rehab. last drink 06/29/14  . Drug Use: 0.20 per week     Comment: "Crack" quit 06/29/14  . Sexual Activity: Not Asked   Other Topics Concern  . None   Social History Narrative   Additional Social History:                          Sleep: Fair  Appetite:  Fair  Current Medications: Current Facility-Administered Medications  Medication Dose Route Frequency Provider Last Rate Last Dose  . acetaminophen (TYLENOL) tablet 650 mg  650 mg Oral Q6H PRN Thermon LeylandLaura A Davis, NP      . alum & mag hydroxide-simeth (MAALOX/MYLANTA) 200-200-20 MG/5ML suspension 30 mL  30 mL Oral Q4H PRN Thermon LeylandLaura A Davis, NP      . escitalopram (LEXAPRO) tablet 15 mg  15 mg Oral Daily Sanjuana KavaAgnes I Nwoko, NP   15 mg at 09/10/15 16100812  . feeding supplement (ENSURE ENLIVE) (ENSURE ENLIVE) liquid 237 mL  237 mL Oral BID BM Thermon LeylandLaura A Davis, NP   237 mL at 09/10/15 1529  . fluticasone (FLONASE) 50 MCG/ACT nasal spray 2 spray  2 spray Each Nare Daily PRN Charm RingsJamison Y Lord, NP      . gabapentin (NEURONTIN) capsule 300 mg  300 mg Oral TID Rachael FeeIrving A Sandia Pfund, MD   300 mg at 09/10/15 1230  . ibuprofen (ADVIL,MOTRIN) tablet 600 mg  600 mg Oral Q8H PRN Charm RingsJamison Y Lord, NP   600 mg at 09/10/15 1532  . magnesium hydroxide (MILK OF MAGNESIA) suspension 30 mL  30 mL Oral Daily PRN Thermon LeylandLaura A Davis, NP      . multivitamin with minerals tablet 1 tablet  1 tablet Oral Daily Adonis BrookSheila Agustin, NP   1 tablet at 09/10/15 0813  . nicotine (NICODERM CQ - dosed in mg/24 hours) patch 21 mg  21 mg Transdermal Daily Charm RingsJamison Y Lord, NP   21 mg at 09/10/15 0814  . ondansetron (ZOFRAN) tablet 4 mg  4 mg Oral Q8H PRN Charm RingsJamison Y Lord, NP      . pantoprazole (PROTONIX) EC tablet 40 mg  40 mg Oral Daily Charm RingsJamison Y Lord, NP   40 mg at 09/10/15 0814  . pneumococcal 23 valent vaccine (PNU-IMMUNE) injection 0.5 mL  0.5 mL Intramuscular Tomorrow-1000 Thermon LeylandLaura A Davis, NP   0.5 mL at 09/03/15 1517  . pravastatin (PRAVACHOL) tablet 40 mg  40 mg Oral q1800 Charm RingsJamison Y Lord, NP   40 mg at 09/09/15 1749  . propranolol (INDERAL) tablet 80 mg  80 mg Oral BID Charm RingsJamison Y Lord, NP   80 mg at 09/10/15 0814  . traZODone (DESYREL) tablet 200 mg  200 mg Oral QHS Sanjuana KavaAgnes I Nwoko, NP   200 mg at 09/09/15 2119  . vitamin C  (ASCORBIC ACID) tablet 1,000 mg  1,000 mg Oral Daily Sanjuana KavaAgnes I Nwoko, NP   1,000 mg at 09/10/15 0815  . zolpidem (AMBIEN) tablet 5 mg  5 mg Oral QHS PRN Charm RingsJamison Y Lord, NP   5 mg at 09/08/15 2154    Lab Results: No results found for this  or any previous visit (from the past 48 hour(s)).  Blood Alcohol level:  Lab Results  Component Value Date   [facility] 231* 08/31/2015    Physical Findings: AIMS: Facial and Oral Movements Muscles of Facial Expression: None, normal Lips and Perioral Area: None, normal Jaw: None, normal Tongue: None, normal,Extremity Movements Upper (arms, wrists, hands, fingers): None, normal Lower (legs, knees, ankles, toes): None, normal, Trunk Movements Neck, shoulders, hips: None, normal, Overall Severity Severity of abnormal movements (highest score from questions above): None, normal Incapacitation due to abnormal movements: None, normal Patient's awareness of abnormal movements (rate only patient's report): No Awareness, Dental Status Current problems with teeth and/or dentures?: No Does patient usually wear dentures?: No  CIWA:  CIWA-Ar Total: 7 COWS:  COWS Total Score: 5  Musculoskeletal: Strength & Muscle Tone: within normal limits Gait & Station: unsteady, uses a walker Patient leans: normal  Psychiatric Specialty Exam: Review of Systems  Constitutional: Positive for malaise/fatigue.  HENT: Negative.   Eyes: Negative.   Respiratory: Negative.   Cardiovascular: Negative.   Gastrointestinal: Negative.   Genitourinary: Negative.   Musculoskeletal: Negative.   Skin: Negative.   Neurological: Positive for tremors, sensory change and weakness.  Endo/Heme/Allergies: Negative.   Psychiatric/Behavioral: Positive for substance abuse. The patient is nervous/anxious.     Blood pressure 105/56, pulse 77, temperature 97.8 F (36.6 C), temperature source Oral, resp. rate 16, height  (1.854 m), weight 74.844 kg (165 lb), SpO2 99 %.Body mass index is 21.77  kg/(m^2).  General Appearance: Fairly Groomed  Patent attorney::  Fair  Speech:  Clear and Coherent  Volume:  Decreased  Mood:  Anxious, Depressed and worried  Affect:  anxious worried  Thought Process:  Coherent and Goal Directed  Orientation:  Full (Time, Place, and Person)  Thought Content:  symptoms events worries concerns  Suicidal Thoughts:  No  Homicidal Thoughts:  No  Memory:  Immediate;   Fair Recent;   Fair Remote;   Fair  Judgement:  Fair  Insight:  Present and Shallow  Psychomotor Activity:  Restlessness  Concentration:  Fair  Recall:  [employer] of Knowledge:Fair  Language: Fair  Akathisia:  No  Handed:  Right  AIMS (if indicated):     Assets:  Desire for Improvement Housing Social Support  ADL's:  Intact  Cognition: WNL  Sleep:  Number of Hours: 6.25   Treatment Plan Summary: Daily contact with patient to assess and evaluate symptoms and progress in treatment and Medication management  Supportive approach/coping skills Alcohol dependence; continue to work a relapse prevention plan Depression; continue the Lexapro 15 mg and optimize dose response Neuropathy; continue to work with Neurontin  Work with CBT/mindfulness Rachael Fee, MD 09/10/2015, 4:56 PM

## 2015-09-10 NOTE — Progress Notes (Addendum)
1:1 Note 0800  Patient laying in bed in his room with eyes closed.  No signs/symptoms of pain/distress noted on patient's face/body movements.  Respirations even and unlabored.  1:1 continues for safety.  0900  Patient laying in bed with 1:1 present for safety.  No signs/symptoms of pain/distress noted on patient's face/body movements.  Respirations even and unlabored.  Patient took medications without any difficulty this morning.  Patient denied SI and HI, contracts for safety.  Patient denied A/V hallucinations.  Patient denied pain.  1:1 in room with patient, wheelchair and walker also in patient's room if needed.  1040  Patient continues to lay in bed with 1:1 present in his room for safety.  Respirations even and unlabored.  No signs/symptoms of pain/distress noted on patient's face/body movements.  Safety maintained with 1:1 per MD order.

## 2015-09-10 NOTE — Progress Notes (Signed)
Recreation Therapy Notes  Date: 04.03.2017 Time: 9:30am Location: [address] Dayroom   Group Topic: Stress Management  Goal Area(s) Addresses:  Patient will actively participate in stress management techniques presented during session.   Behavioral Response: Did not attend.   Marykay Lexenise L Azelea Seguin, LRT/CTRS        Braylyn Kalter L 09/10/2015 3:13 PM

## 2015-09-10 NOTE — Progress Notes (Signed)
D: Patient asleep with no s/s of distress noted at this time.  A: Patient remains on a 1:1 observation for safety. R: No needs currently.

## 2015-09-10 NOTE — Progress Notes (Addendum)
1:1  1300  Patient laying in bed resting with eyes closed.  Respirations even and unlabored.  No signs/symptoms of pain/distress noted on patient's face/body movements.  1:1 continues per MD note for patient's safety.    1530  Patient sitting in dayroom with 1:1 present.  Has been using walker in hallway to ambulate.  Patient denied SI and HI, contracts for safety.  Denied A/V hallucinations.  Respirations even and unlabored.  No signs/symptoms of pain/distress noted on patient's face/body movements.  Emotional support and encouragement given patient.  Safety maintained with 1:1 per MD order.  1620  Patient's self inventory sheet, patient sleeps good, sleep medication is helpful.  Good appetite, low energy level, poor concentration.  Rated depression, hopeless and anxiety #7.  Has experienced tremors which have continued.  Has experience leg pain, worst pain #9 in past 24 hours.  Goal is to feel better physically.  Plans to discharge home. 1:1 continues per MD order for safety.

## 2015-09-11 NOTE — Plan of Care (Signed)
Problem: Consults Goal: Substance Abuse Patient Education See Patient Education Module for education specifics.  Outcome: Progressing Nurse discussed substance abuse/depression/coping skills with patient.     

## 2015-09-11 NOTE — [facility] Group Notes (Signed)
[facility] LCSW Group Therapy  09/11/2015 2:15 PM  Type of Therapy:  Group Therapy  Participation Level:  Active  Participation Quality:  Attentive  Affect:  Appropriate  Cognitive:  Alert and Oriented  Insight:  Improving  Engagement in Therapy:  Improving  Modes of Intervention:  Confrontation, Discussion, Education, Exploration, Problem-solving, Rapport Building, Socialization and Support  Summary of Progress/Problems: [facility] Speaker came to talk about his personal journey with substance abuse and addiction. The pt processed ways by which to relate to the speaker. [facility] speaker provided handouts and educational information pertaining to groups and services [facility].   Smart, Montague Corella LCSW 09/11/2015, 2:15 PM

## 2015-09-11 NOTE — Progress Notes (Signed)
Patient ID: Thomas Mcneil, male   DOB: [birth year], 65 y.o.   MRN: [id number] [facility] MD Progress Note  09/11/2015 5:32 PM VIGGO PERKO  MRN:  [id number] Subjective:  [name] continues to exhibit the tremor He has the tremor as well as the neuropathic pain.  States it has never been this bad before. He is worried that there is something else wrong with him. He is planning to go to the [place] and keep his appointment with the medical clinic. Principal Problem: Severe recurrent major depression without psychotic features (HCC) Diagnosis:   Patient Active Problem List   Diagnosis Date Noted  . Alcohol use disorder, severe, dependence (HCC) [F10.20] 09/04/2015  . Severe recurrent major depression without psychotic features (HCC) [F33.2] 09/04/2015  . Alcohol dependence, uncomplicated (HCC) [F10.20] 09/01/2015  . Alcohol-induced mood disorder (HCC) [F10.94] 09/01/2015  . Cocaine abuse [F14.10] 09/01/2015  . Alcohol abuse [F10.10] 04/05/2014  . Essential tremor [G25.0] 01/04/2014  . Other and unspecified hyperlipidemia [E78.5] 01/04/2014  . B12 deficiency [E53.8] 01/04/2014  . Melena [K92.1] 10/23/2012  . GERD [K21.9] 05/27/2010  . SHOULDER PAIN, LEFT [M25.519] 05/21/2010  . ONYCHOMYCOSIS, TOENAILS [B35.1] 05/09/2010  . KNEE PAIN, BILATERAL [M25.569] 06/27/2009  . LEG CRAMPS [R25.2] 06/27/2009  . Chronic hepatitis C without hepatic coma (HCC) [B18.2] 09/05/2008  . CONTUSION OF UNSPECIFIED SITE [T14.8] 08/18/2008  . LEG EDEMA, BILATERAL [R60.9] 01/13/2008  . ERECTILE DYSFUNCTION [F52.8] 10/15/2007  . TOBACCO ABUSE [F17.200] 08/16/2007  . Mononeuritis of lower limb [G57.90] 08/16/2007  . CORNS AND CALLUSES [L84] 08/16/2007  . ALCOHOL ABUSE, HX OF [F10.21] 08/16/2007   Total Time spent with patient: 20 minutes  Past Psychiatric History: see admission H and P  Past Medical History:  Past Medical History  Diagnosis Date  . Depression   . GERD (gastroesophageal reflux disease)   . Complication of  anesthesia     slow to wake up after last surgery  . Peripheral neuropathy (HCC)     feet and hands  . Arthritis   . Hepatitis     hep c  . High cholesterol   . High blood pressure   . Neuropathy [facility])     Past Surgical History  Procedure Laterality Date  . Tonsillectomy    . Direct laryngoscopy  12/26/2011    Procedure: DIRECT LARYNGOSCOPY;  Surgeon: Melvenia Beam, MD;  Location: [facility] OR;  Service: ENT;  Laterality: N/A;  Directy Laryngoscopy with biopsy [phone]  . Direct laryngoscopy N/A 10/21/2012    Procedure: DIRECT LARYNGOSCOPY;  Surgeon: Melvenia Beam, MD;  Location: [facility] OR;  Service: ENT;  Laterality: N/A;  . Esophagogastroduodenoscopy N/A 10/25/2012    Procedure: ESOPHAGOGASTRODUODENOSCOPY (EGD);  Surgeon: Charna Elizabeth, MD;  Location: [facility] ENDOSCOPY;  Service: Endoscopy;  Laterality: N/A;   Family History:  Family History  Problem Relation Age of Onset  . Hypertension Mother   . Diverticulosis Mother   . GER disease Mother   . Lung disease Sister   . Polymyositis Sister    Family Psychiatric  History: see admission H and P Social History:  History  Alcohol Use  . 42.0 oz/week  . 0 Standard drinks or equivalent, 70 Cans of beer per week    Comment: [place] - currently in [facility] for counseling/rehab. last drink 06/29/14     History  Drug Use  . 0.20 per week    Comment: "Crack" quit 06/29/14    Social History   Social History  . Marital Status: Widowed    Spouse  Name: N/A  . Number of Children: N/A  . Years of Education: N/A   Social History Main Topics  . Smoking status: Current Some Day Smoker -- 0.25 packs/day for 45 years    Types: Cigarettes    Start date: 06/09/2012  . Smokeless tobacco: None     Comment: quitting, using e-cig  . Alcohol Use: 42.0 oz/week    0 Standard drinks or equivalent, 70 Cans of beer per week     Comment: [place] - currently in [facility] for counseling/rehab. last drink 06/29/14  . Drug Use: 0.20 per week     Comment: "Crack" quit 06/29/14  .  Sexual Activity: Not Asked   Other Topics Concern  . None   Social History Narrative   Additional Social History:                         Sleep: Fair  Appetite:  Fair  Current Medications: Current Facility-Administered Medications  Medication Dose Route Frequency Provider Last Rate Last Dose  . acetaminophen (TYLENOL) tablet 650 mg  650 mg Oral Q6H PRN Thermon Leyland, NP      . alum & mag hydroxide-simeth (MAALOX/MYLANTA) 200-200-20 MG/5ML suspension 30 mL  30 mL Oral Q4H PRN Thermon Leyland, NP      . escitalopram (LEXAPRO) tablet 15 mg  15 mg Oral Daily Sanjuana Kava, NP   15 mg at 09/11/15 0842  . feeding supplement (ENSURE ENLIVE) (ENSURE ENLIVE) liquid 237 mL  237 mL Oral BID BM Thermon Leyland, NP   237 mL at 09/11/15 1513  . fluticasone (FLONASE) 50 MCG/ACT nasal spray 2 spray  2 spray Each Nare Daily PRN Charm Rings, NP      . gabapentin (NEURONTIN) capsule 300 mg  300 mg Oral TID Rachael Fee, MD   300 mg at 09/11/15 1708  . ibuprofen (ADVIL,MOTRIN) tablet 600 mg  600 mg Oral Q8H PRN Charm Rings, NP   600 mg at 09/10/15 1532  . magnesium hydroxide (MILK OF MAGNESIA) suspension 30 mL  30 mL Oral Daily PRN Thermon Leyland, NP      . multivitamin with minerals tablet 1 tablet  1 tablet Oral Daily Adonis Brook, NP   1 tablet at 09/11/15 0843  . nicotine (NICODERM CQ - dosed in mg/24 hours) patch 21 mg  21 mg Transdermal Daily Charm Rings, NP   21 mg at 09/11/15 0844  . ondansetron (ZOFRAN) tablet 4 mg  4 mg Oral Q8H PRN Charm Rings, NP      . pantoprazole (PROTONIX) EC tablet 40 mg  40 mg Oral Daily Charm Rings, NP   40 mg at 09/11/15 0843  . pneumococcal 23 valent vaccine (PNU-IMMUNE) injection 0.5 mL  0.5 mL Intramuscular Tomorrow-1000 Thermon Leyland, NP   0.5 mL at 09/03/15 1517  . pravastatin (PRAVACHOL) tablet 40 mg  40 mg Oral q1800 Charm Rings, NP   40 mg at 09/11/15 1709  . propranolol (INDERAL) tablet 80 mg  80 mg Oral BID Charm Rings, NP   80 mg  at 09/11/15 1708  . traZODone (DESYREL) tablet 200 mg  200 mg Oral QHS Sanjuana Kava, NP   200 mg at 09/10/15 2130  . vitamin C (ASCORBIC ACID) tablet 1,000 mg  1,000 mg Oral Daily Sanjuana Kava, NP   1,000 mg at 09/11/15 0843  . zolpidem (AMBIEN) tablet 5 mg  5  mg Oral QHS PRN Charm RingsJamison Y Lord, NP   5 mg at 09/10/15 2129    Lab Results: No results found for this or any previous visit (from the past 48 hour(s)).  Blood Alcohol level:  Lab Results  Component Value Date   [facility]    Physical Findings: AIMS: Facial and Oral Movements Muscles of Facial Expression: None, normal Lips and Perioral Area: None, normal Jaw: None, normal Tongue: None, normal,Extremity Movements Upper (arms, wrists, hands, fingers): None, normal Lower (legs, knees, ankles, toes): None, normal, Trunk Movements Neck, shoulders, hips: None, normal, Overall Severity Severity of abnormal movements (highest score from questions above): None, normal Incapacitation due to abnormal movements: None, normal Patient's awareness of abnormal movements (rate only patient's report): No Awareness, Dental Status Current problems with teeth and/or dentures?: No Does patient usually wear dentures?: No  CIWA:  CIWA-Ar Total: 5 COWS:  COWS Total Score: 5  Musculoskeletal: Strength & Muscle Tone: within normal limits Gait & Station: unsteady, uses a walker Patient leans: normal  Psychiatric Specialty Exam: Review of Systems  Constitutional: Positive for malaise/fatigue.  HENT: Negative.   Eyes: Negative.   Respiratory: Negative.   Cardiovascular: Negative.   Gastrointestinal: Negative.   Genitourinary: Negative.   Musculoskeletal: Negative.   Skin: Negative.   Neurological: Positive for tremors, sensory change and weakness.  Endo/Heme/Allergies: Negative.   Psychiatric/Behavioral: Positive for substance abuse. The patient is nervous/anxious.     Blood pressure 99/62, pulse 66, temperature 98.4 F (36.9 C),  temperature source Oral, resp. rate 16, height 6\' 1"  (1.854 m), weight 74.844 kg (165 lb), SpO2 99 %.Body mass index is 21.77 kg/(m^2).  General Appearance: Fairly Groomed  Patent attorneyye Contact::  Fair  Speech:  Clear and Coherent  Volume:  Decreased  Mood:  Anxious, Depressed and worried  Affect:  anxious worried  Thought Process:  Coherent and Goal Directed  Orientation:  Full (Time, Place, and Person)  Thought Content:  symptoms events worries concerns  Suicidal Thoughts:  No  Homicidal Thoughts:  No  Memory:  Immediate;   Fair Recent;   Fair Remote;   Fair  Judgement:  Fair  Insight:  Present and Shallow  Psychomotor Activity:  Restlessness  Concentration:  Fair  Recall:  FiservFair  Fund of Knowledge:Fair  Language: Fair  Akathisia:  No  Handed:  Right  AIMS (if indicated):     Assets:  Desire for Improvement Housing Social Support  ADL's:  Intact  Cognition: WNL  Sleep:  Number of Hours: 6.25   Treatment Plan Summary: Daily contact with patient to assess and evaluate symptoms and progress in treatment and Medication management  Supportive approach/coping skills Alcohol dependence; continue to work a relapse prevention plan Depression; continue the Lexapro 15 mg and optimize dose response Neuropathy; continue to work with Neurontin  Work with CBT/mindfulness Rachael FeeLUGO,Sharunda Salmon A, MD 09/11/2015, 5:32 PM

## 2015-09-11 NOTE — Progress Notes (Addendum)
1:1 Note 0830  Patient using rolling walker went to dayroom with 1:1 present for safety.  Patient denied SI and HI, contracts for safety.  Denied A/V hallucinations.  Respirations even and unlabored.  No signs/symptoms of pain/distress noted on patient's face/body movements.  1:1 continues for safety per MD order.  1100  Patient sitting in dayroom with 1:1 present for safety.  Patient continues to ambulate with walker.  Patient denied SI and HI, contracts for safety.  Denied A/V hallucinations.  Respirations even and unlabored.  No signs/symptoms of pain/distress noted on patient's face/body movements.  1:1 continues for patient's safety.  1300  Patient has been sitting in dayroom with 1:1 present for safety.  Stated he ate all his lunch.  Has been talking to 1:1 and smiling occasionally.  Patient denied SI and HI, contracts for safety.  Denied A/V hallucinations.  Respirations even and unlabored.  No signs/symptoms of pain/distress noted on patient's face/body movements.  1:1 continues per MD order for safety.

## 2015-09-11 NOTE — Progress Notes (Signed)
Pt mentioned he had a good day and he attended all meetings and was able to exercise. Pt goal for tomorrow is to attend all meetings and to get more exercise in i.e walking.

## 2015-09-11 NOTE — Progress Notes (Signed)
1:1 Note 1500  Patient continues to sit in dayroom with 1:1 present.  Patient denied SI and HI, contracts for safety.  Denied A/V hallucinations.  Respirations even and unlabored.  No signs/symptoms of pain/distress noted on patient's face/body movements.  Patient continues to ambulate using rolling walker.  1:1 continues per MD order.  1730  Patient eating dinner in dining room, ate 100% of dinner.  Patient denied A/V hallucinations.  Denied SI and HI, contracts for safety.  Respirations even and unlabored.  No signs/symptoms of pain/distress noted on patient's face/body movements.  1:1 continues per MD orders.

## 2015-09-11 NOTE — Progress Notes (Signed)
Pt is a little stronger but is still weak and unsteady and needs stand by assist when ambulating with his walker    Pt is on a 1:1 for safety   Safety maintained

## 2015-09-11 NOTE — Progress Notes (Signed)
Recreation Therapy Notes  Animal-Assisted Activity (AAA) Program Checklist/Progress Notes Patient Eligibility Criteria Checklist & Daily Group note for Rec Tx Intervention  Date: 04.04.2017 Time: 2:45pm [address]    AAA/T Program Assumption of Risk Form signed by Patient/ or Parent Legal Guardian Yes  Patient is free of allergies or sever asthma Yes  Patient reports no fear of animals Yes  Patient reports no history of cruelty to animals Yes  Patient understands his/her participation is voluntary Yes  Behavioral Response: Did not attend.    Marykay Lexenise L Vipul Cafarelli, LRT/CTRS        Britni Driscoll L 09/11/2015 3:05 PM

## 2015-09-11 NOTE — Progress Notes (Addendum)
Pt is brighter and less tremulous   He is still very unsteady and needs stand by assist for ambulation    Pt is on 1:1 and is presently safe

## 2015-09-11 NOTE — [facility] Group Notes (Signed)

## 2015-09-11 NOTE — Progress Notes (Signed)
1:1 note    Pt has been asleep with no distress noted   He remains on a 1:1 for safety and being a high fall risk    He is presently safe

## 2015-09-11 NOTE — Progress Notes (Signed)
Pt remains a high fall risk due to an unsteady gait and moderate to severe tremors and he has balance problems   Pt is on a 1:1 for safety and is presently safe 

## 2015-09-11 NOTE — Progress Notes (Signed)
CSW met with pt individually today. Dara expressed interest in getting set up for CDIOP through [facility]. CSW contacted Brandon Melnick who will meet with pt this afternoon. Milos asked for an additional day due to general weakness/shakes. Per MD, pt may discharge on Thursday. CSW contacted pt's sister to notify her of change in discharge date.  Maxie Better, MSW, LCSW Clinical Social Worker 09/11/2015 2:24 PM

## 2015-09-11 NOTE — [facility] Group Notes (Signed)
Patient attend AA group 

## 2015-09-11 NOTE — Progress Notes (Addendum)
1:1 Note: 1850  Patient continues to sit in dayroom with 1:1 for assistance.  Denied SI and HI, contracts for safety.  Denied A/V hallucinations.  Respirations even and unlabored.  No signs/symptoms of pain/distress noted on patient's face/body movements.  Patient ate 100% of his dinner.  1:1 continues for safety per MD order.  Patient's self inventory sheet, patient sleeps good, sleep medication is helpful.  Good appetite, low energy level, good concentration.  Rated depression 7, hopeless 8, anxiety 8.  Denied withdrawals.  Denied SI.  Denied physical problems.  Worst pain in past 24 hours is #8, knees.  Goal is to attend groups and use walker more to get physical body going with the use of legs.  Plans to attend groups and stay in dayroom to participate.  Does have discharge plans.

## 2015-09-12 LAB — HCV RNA QUANT: HCV QUANT: UNDETERMINED [IU]/mL (ref >50)

## 2015-09-12 NOTE — Progress Notes (Addendum)
1:1 Note: 0830  Patient laying in bed with eyes closed.  No signs/symptoms pain/distress noted on patient's face/body movements.  Respirations even and unlabored.  Patient denied SI and HI, contracts for safety.  Denied A/V hallucinations.  Patient's tremors continue.  1:1 continues for safety per MD order.  1100  Patient has continued to sit in dayroom with peers and 1:1.  Patient denied SI and HI, contracts for safety.  Denied A/V hallucinations.  Respirations even and unlabored.  No signs/symptoms of pain/distress noted on patient's face/body movements.  1:1 continues per MD order for safety.  1245  Patient ate 100% lunch while sitting in dayroom with 1:1 present.  Patient denied pain.  Denied SI and HI, contracts for safety.  Denied A/V hallucinations.  Respirations even and unlabored.  No signs/symptoms of pain/distress noted on patient's face/body movements.  Safety maintained with 1:1 present per MD order.

## 2015-09-12 NOTE — Progress Notes (Signed)
[facility]  09/12/2015 11:21 AM [name] E Bhalla  MRN:  [id number] Subjective: Thomas AshCarl endorses that he is still not feeling right. Admits to feeling depressed. The resting tremor has been getting worst as he continues to detox (Parkinson's like tremor) Principal Problem: Severe recurrent major depression without psychotic features (HCC) Diagnosis:   Patient Active Problem List   Diagnosis Date Noted  . Alcohol use disorder, severe, dependence (HCC) [F10.20] 09/04/2015  . Severe recurrent major depression without psychotic features (HCC) [F33.2] 09/04/2015  . Alcohol dependence, uncomplicated (HCC) [F10.20] 09/01/2015  . Alcohol-induced mood disorder (HCC) [F10.94] 09/01/2015  . Cocaine abuse [F14.10] 09/01/2015  . Alcohol abuse [F10.10] 04/05/2014  . Essential tremor [G25.0] 01/04/2014  . Other and unspecified hyperlipidemia [E78.5] 01/04/2014  . B12 deficiency [E53.8] 01/04/2014  . Melena [K92.1] 10/23/2012  . GERD [K21.9] 05/27/2010  . SHOULDER PAIN, LEFT [M25.519] 05/21/2010  . ONYCHOMYCOSIS, TOENAILS [B35.1] 05/09/2010  . KNEE PAIN, BILATERAL [M25.569] 06/27/2009  . LEG CRAMPS [R25.2] 06/27/2009  . Chronic hepatitis C without hepatic coma (HCC) [B18.2] 09/05/2008  . CONTUSION OF UNSPECIFIED SITE [T14.8] 08/18/2008  . LEG EDEMA, BILATERAL [R60.9] 01/13/2008  . ERECTILE DYSFUNCTION [F52.8] 10/15/2007  . TOBACCO ABUSE [F17.200] 08/16/2007  . Mononeuritis of lower limb [G57.90] 08/16/2007  . CORNS AND CALLUSES [L84] 08/16/2007  . ALCOHOL ABUSE, HX OF [F10.21] 08/16/2007   Total Time spent with patient: 15 minutes  Past Psychiatric History: see admission H and P  Past Medical History:  Past Medical History  Diagnosis Date  . Depression   . GERD (gastroesophageal reflux disease)   . Complication of anesthesia     slow to wake up after last surgery  . Peripheral neuropathy (HCC)     feet and hands  . Arthritis   . Hepatitis     hep c  . High cholesterol   . High  blood pressure   . Neuropathy [facility](HCC)     Past Surgical History  Procedure Laterality Date  . Tonsillectomy    . Direct laryngoscopy  12/26/2011    Procedure: DIRECT LARYNGOSCOPY;  Surgeon: Melvenia BeamMitchell Gore, MD;  [facility];  Service: ENT;  Laterality: N/A;  Directy Laryngoscopy with biopsy [phone]  . Direct laryngoscopy N/A 10/21/2012    Procedure: DIRECT LARYNGOSCOPY;  Surgeon: Melvenia BeamMitchell Gore, MD;  [facility];  Service: ENT;  Laterality: N/A;  . Esophagogastroduodenoscopy N/A 10/25/2012    Procedure: ESOPHAGOGASTRODUODENOSCOPY (EGD);  Surgeon: Charna ElizabethJyothi Mann, MD;  [facility];  Service: Endoscopy;  Laterality: N/A;   Family History:  Family History  Problem Relation Age of Onset  . Hypertension Mother   . Diverticulosis Mother   . GER disease Mother   . Lung disease Sister   . Polymyositis Sister    Family Psychiatric  History: See admission H and P Social History:  History  Alcohol Use  . 42.0 oz/week  . 0 Standard drinks or equivalent, 70 Cans of beer per week    Comment: [place] - currently in [facility] for counseling/rehab. last drink 06/29/14     History  Drug Use  . 0.20 per week    Comment: "Crack" quit 06/29/14    Social History   Social History  . Marital Status: Widowed    Spouse Name: N/A  . Number of Children: N/A  . Years of Education: N/A   Social History Main Topics  . Smoking status: Current Some Day Smoker -- 0.25 packs/day for 45 years    Types: Cigarettes  Start date: 06/09/2012  . Smokeless tobacco: None     Comment: quitting, using e-cig  . Alcohol Use: 42.0 oz/week    0 Standard drinks or equivalent, 70 Cans of beer per week     Comment: [place] - currently in [facility] for counseling/rehab. last drink 06/29/14  . Drug Use: 0.20 per week     Comment: "Crack" quit 06/29/14  . Sexual Activity: Not Asked   Other Topics Concern  . None   Social History Narrative   Additional Social History:                         Sleep:  Fair  Appetite:  Poor  Current Medications: Current Facility-Administered Medications  Medication Dose Route Frequency Provider Last Rate Last Dose  . acetaminophen (TYLENOL) tablet 650 mg  650 mg Oral Q6H PRN Thermon Leyland, NP      . alum & mag hydroxide-simeth (MAALOX/MYLANTA) 200-200-20 MG/5ML suspension 30 mL  30 mL Oral Q4H PRN Thermon Leyland, NP      . escitalopram (LEXAPRO) tablet 15 mg  15 mg Oral Daily Sanjuana Kava, NP   15 mg at 09/12/15 0817  . feeding supplement (ENSURE ENLIVE) (ENSURE ENLIVE) liquid 237 mL  237 mL Oral BID BM Thermon Leyland, NP   237 mL at 09/12/15 1039  . fluticasone (FLONASE) 50 MCG/ACT nasal spray 2 spray  2 spray Each Nare Daily PRN Charm Rings, NP      . gabapentin (NEURONTIN) capsule 300 mg  300 mg Oral TID Rachael Fee, MD   300 mg at 09/12/15 0818  . ibuprofen (ADVIL,MOTRIN) tablet 600 mg  600 mg Oral Q8H PRN Charm Rings, NP   600 mg at 09/12/15 0829  . magnesium hydroxide (MILK OF MAGNESIA) suspension 30 mL  30 mL Oral Daily PRN Thermon Leyland, NP      . multivitamin with minerals tablet 1 tablet  1 tablet Oral Daily Adonis Brook, NP   1 tablet at 09/12/15 0818  . nicotine (NICODERM CQ - dosed in mg/24 hours) patch 21 mg  21 mg Transdermal Daily Charm Rings, NP   21 mg at 09/12/15 0819  . ondansetron (ZOFRAN) tablet 4 mg  4 mg Oral Q8H PRN Charm Rings, NP      . pantoprazole (PROTONIX) EC tablet 40 mg  40 mg Oral Daily Charm Rings, NP   40 mg at 09/12/15 0818  . pneumococcal 23 valent vaccine (PNU-IMMUNE) injection 0.5 mL  0.5 mL Intramuscular Tomorrow-1000 Thermon Leyland, NP   0.5 mL at 09/03/15 1517  . pravastatin (PRAVACHOL) tablet 40 mg  40 mg Oral q1800 Charm Rings, NP   40 mg at 09/11/15 1709  . propranolol (INDERAL) tablet 80 mg  80 mg Oral BID Charm Rings, NP   80 mg at 09/12/15 0819  . traZODone (DESYREL) tablet 200 mg  200 mg Oral QHS Sanjuana Kava, NP   200 mg at 09/11/15 2125  . vitamin C (ASCORBIC ACID) tablet 1,000 mg   1,000 mg Oral Daily Sanjuana Kava, NP   1,000 mg at 09/12/15 0820  . zolpidem (AMBIEN) tablet 5 mg  5 mg Oral QHS PRN Charm Rings, NP   5 mg at 09/11/15 2125    Lab Results: No results found for this or any previous visit (from the past 48 hour(s)).  Blood Alcohol level:  Lab Results  Component Value Date   [facility] 231* 08/31/2015    Physical Findings: AIMS: Facial and Oral Movements Muscles of Facial Expression: None, normal Lips and Perioral Area: None, normal Jaw: None, normal Tongue: None, normal,Extremity Movements Upper (arms, wrists, hands, fingers): None, normal Lower (legs, knees, ankles, toes): None, normal, Trunk Movements Neck, shoulders, hips: None, normal, Overall Severity Severity of abnormal movements (highest score from questions above): None, normal Incapacitation due to abnormal movements: None, normal Patient's awareness of abnormal movements (rate only patient's report): No Awareness, Dental Status Current problems with teeth and/or dentures?: No Does patient usually wear dentures?: No  CIWA:  CIWA-Ar Total: 6 COWS:  COWS Total Score: 1  Musculoskeletal: Strength & Muscle Tone: within normal limits Gait & Station: unsteady Patient leans: uses a walker   Psychiatric Specialty Exam: Review of Systems  Constitutional: Positive for malaise/fatigue.  HENT: Negative.   Respiratory: Negative.   Cardiovascular: Negative.   Gastrointestinal: Negative.   Genitourinary: Negative.   Musculoskeletal: Negative.   Skin: Negative.   Neurological: Positive for tremors and weakness.  Endo/Heme/Allergies: Negative.   Psychiatric/Behavioral: Positive for depression and substance abuse. The patient is nervous/anxious.     Blood pressure 99/63, pulse 71, temperature 98 F (36.7 C), temperature source Oral, resp. rate 16, height  (1.854 m), weight 74.844 kg (165 lb), SpO2 99 %.Body mass index is 21.77 kg/(m^2).  General Appearance: Disheveled  Eye Contact::   Minimal  Speech:  Clear and Coherent  Volume:  Decreased  Mood:  Anxious and Depressed  Affect:  Restricted  Thought Process:  Coherent and Goal Directed  Orientation:  Full (Time, Place, and Person)  Thought Content:  symptoms events worries concerns  Suicidal Thoughts:  No  Homicidal Thoughts:  No  Memory:  Immediate;   Fair Recent;   Fair Remote;   Fair  Judgement:  Fair  Insight:  Present and Shallow  Psychomotor Activity:  Restlessness and Tremor  Concentration:  Fair  Recall:  [employer] of Knowledge:Fair  Language: Fair  Akathisia:  No  Handed:  Right  AIMS (if indicated):     Assets:  Desire for Improvement  ADL's:  Intact  Cognition: WNL  Sleep:  Number of Hours: 6   Treatment Plan Summary: Daily contact with patient to assess and evaluate symptoms and progress in treatment and Medication management Supportive approach/coping skills Alcohol dependence; continue to work a relapse prevention plan Depression; will increase the  Lexapro to 20 mg Tremor; [facility] next [facility]) Will follow up PT recommendations Work with CBT/mindfulness Karynn Deblasi A, MD 09/12/2015, 11:21 AM

## 2015-09-12 NOTE — Plan of Care (Signed)
Problem: Alteration in mood Goal: LTG-Patient reports reduction in suicidal thoughts (Patient reports reduction in suicidal thoughts and is able to verbalize a safety plan for whenever patient is feeling suicidal)  Outcome: Progressing Nurse discussed depression/anxiey/suicidal thoughts with patient.

## 2015-09-12 NOTE — Progress Notes (Signed)
D:  Patient's self inventory sheet, patient sleeps good, sleep medication helpful.  Good appetite, low energy level, good concentration.  Rated depression, hopeless and anxiety #8.  Withdrawals, tremors.  Denied SI.  Physical problems, legs, worst pain in past 24 hours is #7.  Goal for today is "recovery".  Plans to attend groups and walk.  Does have discharge plans. A:  Medications administered per MD orders.  Emotional support and encouragement given patient. R:  Patient denied SI and HI.  Denied A/V hallucinations.  Safety maintained with 1:1 per MD order.

## 2015-09-12 NOTE — Progress Notes (Signed)
D: Patient resting in bed with eyes closed.  Respirations even and unlabored.  Patient appears to be in no apparent distress. A: Staff to monitor Q 15 mins for safety.  Patient remains on 1:1 for fall risk safety R:Patient remains safe on the unit.  

## 2015-09-12 NOTE — Progress Notes (Signed)
Recreation Therapy Notes  Date: 04.04.02017 Time: 9:30am Location: 300 Hall Group Room   Group Topic: Stress Management  Goal Area(s) Addresses:  Patient will actively participate in stress management techniques presented during session.   Behavioral Response: Did not attend.   Marykay Lexenise L Malory Spurr, LRT/CTRS        Diangelo Radel L 09/12/2015 2:20 PM

## 2015-09-12 NOTE — Progress Notes (Signed)
1:1 note    Pt has been asleep with no distress noted   He remains on a 1:1 for safety and being a high fall risk    He is presently safe 

## 2015-09-12 NOTE — Progress Notes (Signed)
1:1 Note 1430  Patient has been sitting in dayroom today, attending groups with 1:1 present for safety.  Patient denied SI and HI, contracts for safety.  Denied A/V hallucinations.  Denied pain.  Respirations even and unlabored.  No signs/symptoms of pain/distress noted on patient's face/body movements.  Patient stated he ate 100% of his lunch. Patient stated he talked to MD about discharge, home health, etc.  1:1 continues for safety.  Patient continues to ambulate with walker.

## 2015-09-12 NOTE — [facility] Group Notes (Signed)
[facility]   09/12/2015 9:34 AM  Participation Quality:  Appropriate   Mood/Affect:  Appropriate  Depression Rating:  8  Anxiety Rating:  8  Thoughts of Suicide:  No Will you contract for safety?   NA  Current AVH:  No  Plan for Discharge/Comments:  Pt reports that "nothing has changed. I still feel weakness in my legs." Pt reports shakes and fair sleep. "I tossed and turned." Pt has d/c scheduled for tomorrow and is returning home. [facility].   Transportation Means: mother/friend  Supports: mother and sister  Smart, Herbert SetaHeather LCSW

## 2015-09-12 NOTE — Progress Notes (Addendum)
Nursing 1:1 Note: D:Patient states he had a good day.  Patient states he is worried that he is unsteady on his feet and that he has a tremor.  Patient states, "It was not this bad before."  Patient state she lives alone and hopes he will be able to get someone to come into his home to help him out.  Patient denies SI/HI and denies AVH.  Patient is in 1:1 for safety. A: Staff to monitor Q 15 mins for safety.  Encouragement and support offered.  Scheduled medications administered per orders.  Patient remains on 1:1 for fall risk safety. R: Patient remains safe on the unit.  Patient attended group tonight.  Patient visible on the unit.  Patient taking administered medications.

## 2015-09-12 NOTE — Progress Notes (Signed)
Pt attended NA meeting this evening.  

## 2015-09-12 NOTE — Progress Notes (Signed)
Physical Therapy Treatment Patient Details Name: Thomas Mcneil Theiler MRN: [id number] DOB: 12/23/1950 Today's Date: 09/12/2015    History of Present Illness Pt is 65 yo male [facility] excessive alcohal consumption and suicidagl ideations.  He is also found to have peripheral neuropathy with unsteady gait and pain in knees     PT Comments    Pt in group with 1:1 sitter.  Staff reports, pt has been amb to all activities and meals with his walker.  Still noted tremors throughout.  Performed BERG balance test which pt scored 27/56 inicating HIGH FALL RISK.  Pt demonstrates a definite need for RW.  Trial amb without walker demonstarted an immediate near fall.  Pt c/o MAX B LE weakness and his "peripheral neuropathy" is bad.    Follow Up Recommendations  SNF (LPT rec SNF on eval for gait instability and B LE werakness.  )  however, pt stated today he is going home tomorrow.    Equipment Recommendations  Rolling walker with 5" wheels    Recommendations for Other Services       Precautions / Restrictions Precautions Precautions: Fall Precaution Comments: tremors/neuropathy Restrictions Weight Bearing Restrictions: No    Mobility  Bed Mobility               General bed mobility comments: NT  Transfers Overall transfer level: Modified independent Equipment used: None             General transfer comment: def use of hands to push up and steady self   Ambulation/Gait Ambulation/Gait assistance: Supervision;Min guard Ambulation Distance (Feet): 285 Feet Assistive device: Rolling walker (2 wheeled) Gait Pattern/deviations: Step-through pattern;Decreased stride length;Shuffle;Staggering left;Staggering right Gait velocity: decreased   General Gait Details: unsteady gait with noted tremors throughout.  Definite need for RW.  Trial amb 10 feet without any AD demonstrated severe instability and near fall.    Stairs            Wheelchair Mobility     Modified Rankin (Stroke Patients Only)       Balance Overall balance assessment: Needs assistance                                  Cognition Arousal/Alertness: Awake/alert Behavior During Therapy: WFL for tasks assessed/performed Overall Cognitive Status: Within Functional Limits for tasks assessed                      Exercises      General Comments        Pertinent Vitals/Pain Pain Assessment: No/denies pain    Home Living                      Prior Function            PT Goals (current goals can now be found in the care plan section) Progress towards PT goals: Progressing toward goals    Frequency  Min 2X/week    PT Plan Current plan remains appropriate    Co-evaluation             End of Session Equipment Utilized During Treatment: Gait belt Activity Tolerance: Patient tolerated treatment well Patient left: in chair;with family/visitor present (1:1 sitter)     Time: [id number] PT Time Calculation (min) (ACUTE ONLY): 14 min  Charges:  $Gait Training: 8-22 mins  G Codes:      Rica Koyanagi  PTA [facility]  Acute  Rehab Pager      [phone]

## 2015-09-12 NOTE — Progress Notes (Signed)
1:1 Note 1730  Presently patient is laying in bed with his eyes closed and 1:1 continues.  Respirations even and unlabored.  No signs/symptoms of pain/distress noted on patient's face/body movements.  Safety maintained with 1:1 per MD order.

## 2015-09-12 NOTE — Progress Notes (Signed)
Pt remains a high fall risk due to an unsteady gait and moderate to severe tremors and he has balance problems   Pt is on a 1:1 for safety and is presently safe

## 2015-09-12 NOTE — BHH Group Notes (Signed)
B[facility] LCSW Group Therapy  09/12/2015 3:08 PM  Type of Therapy:  Group Therapy  Participation Level:  Active  Participation Quality:  Attentive  Affect:  Appropriate  Cognitive:  Alert and Oriented  Insight:  Improving  Engagement in Therapy:  Improving  Modes of Intervention:  Confrontation, Discussion, Education, Exploration, Problem-solving, Rapport Building, Socialization and Support  Summary of Progress/Problems: Feelings around Diagnosis. Thomas Mcneil was attentive and engaged during today's processing group. He shared that "there is definitely a stigma associated with substance abuse. People just blame me and tell me to stop." Thomas Mcneil shared that he has some supportive family but most family members have distanced themselves from him. Thomas Mcneil continues to show progress in the group setting with improving insight.   Smart, Churchill Grimsley LCSW 09/12/2015, 3:08 PM

## 2015-09-13 MED ORDER — LOVASTATIN 20 MG PO TABS: 20.0000 mg | ORAL_TABLET | Freq: Every day | ORAL | Status: AC

## 2015-09-13 MED ORDER — PROPRANOLOL HCL 80 MG PO TABS: 80.0000 mg | ORAL_TABLET | Freq: Two times a day (BID) | ORAL | Status: DC

## 2015-09-13 MED ORDER — FLUTICASONE PROPIONATE 50 MCG/ACT NA SUSP: 2.0000 | Freq: Every day | NASAL | Status: AC | PRN

## 2015-09-13 MED ORDER — ZOLPIDEM TARTRATE 5 MG PO TABS: 5.0000 mg | ORAL_TABLET | Freq: Every evening | ORAL | Status: DC | PRN

## 2015-09-13 MED ORDER — ESCITALOPRAM OXALATE 5 MG PO TABS: 15.0000 mg | ORAL_TABLET | Freq: Every day | ORAL | Status: DC

## 2015-09-13 MED ORDER — VITAMIN C 1000 MG PO TABS: 1000.0000 mg | ORAL_TABLET | Freq: Every day | ORAL | Status: AC

## 2015-09-13 MED ORDER — GABAPENTIN 300 MG PO CAPS: 300.0000 mg | ORAL_CAPSULE | Freq: Three times a day (TID) | ORAL | Status: DC

## 2015-09-13 MED ORDER — OMEPRAZOLE 40 MG PO CPDR: DELAYED_RELEASE_CAPSULE | ORAL | Status: DC

## 2015-09-13 MED ORDER — TRAZODONE HCL 100 MG PO TABS: 200.0000 mg | ORAL_TABLET | Freq: Every day | ORAL | Status: AC

## 2015-09-13 MED ORDER — NICOTINE 21 MG/24HR TD PT24: 21.0000 mg | MEDICATED_PATCH | Freq: Every day | TRANSDERMAL | Status: DC

## 2015-09-13 NOTE — B[facility] Suicide Risk Assessment (Signed)
[facility] Discharge Suicide Risk Assessment   Principal Problem: Severe recurrent major depression without psychotic features [facility]) Discharge Diagnoses:  Patient Active Problem List   Diagnosis Date Noted  . Alcohol use disorder, severe, dependence (HCC) [F10.20] 09/04/2015  . Severe recurrent major depression without psychotic features (HCC) [F33.2] 09/04/2015  . Alcohol dependence, uncomplicated (HCC) [F10.20] 09/01/2015  . Alcohol-induced mood disorder (HCC) [F10.94] 09/01/2015  . Cocaine abuse [F14.10] 09/01/2015  . Alcohol abuse [F10.10] 04/05/2014  . Essential tremor [G25.0] 01/04/2014  . Other and unspecified hyperlipidemia [E78.5] 01/04/2014  . B12 deficiency [E53.8] 01/04/2014  . Melena [K92.1] 10/23/2012  . GERD [K21.9] 05/27/2010  . SHOULDER PAIN, LEFT [M25.519] 05/21/2010  . ONYCHOMYCOSIS, TOENAILS [B35.1] 05/09/2010  . KNEE PAIN, BILATERAL [M25.569] 06/27/2009  . LEG CRAMPS [R25.2] 06/27/2009  . Chronic hepatitis C without hepatic coma (HCC) [B18.2] 09/05/2008  . CONTUSION OF UNSPECIFIED SITE [T14.8] 08/18/2008  . LEG EDEMA, BILATERAL [R60.9] 01/13/2008  . ERECTILE DYSFUNCTION [F52.8] 10/15/2007  . TOBACCO ABUSE [F17.200] 08/16/2007  . Mononeuritis of lower limb [G57.90] 08/16/2007  . CORNS AND CALLUSES [L84] 08/16/2007  . ALCOHOL ABUSE, HX OF [F10.21] 08/16/2007    Total Time spent with patient: 15 minutes  Musculoskeletal: Strength & Muscle Tone: within normal limits Gait & Station: unsteady Patient leans: uses a walker  Psychiatric Specialty Exam: Review of Systems  Constitutional: Negative.   HENT: Negative.   Eyes: Negative.   Respiratory: Negative.   Cardiovascular: Negative.   Gastrointestinal: Negative.   Genitourinary: Negative.   Musculoskeletal: Negative.   Skin: Negative.   Neurological: Positive for tremors.  Endo/Heme/Allergies: Negative.   Psychiatric/Behavioral: Positive for substance abuse. The patient is nervous/anxious.     Blood  pressure 121/78, pulse 73, temperature 98 F (36.7 C), temperature source Oral, resp. rate 16, height  (1.854 m), weight 74.844 kg (165 lb), SpO2 99 %.Body mass index is 21.77 kg/(m^2).  General Appearance: Fairly Groomed  Patent attorney::  Fair  Speech:  Clear and Coherent409  Volume:  Normal  Mood:  Euthymic  Affect:  Appropriate  Thought Process:  Coherent and Goal Directed  Orientation:  Full (Time, Place, and Person)  Thought Content:  plans as he moves on, relapse prevention plan  Suicidal Thoughts:  No  Homicidal Thoughts:  No  Memory:  Immediate;   Fair Recent;   Fair Remote;   Fair  Judgement:  Fair  Insight:  Present  Psychomotor Activity:  Tremor  Concentration:  Fair  Recall:  [employer] of Knowledge:Fair  Language: Fair  Akathisia:  No  Handed:  Right  AIMS (if indicated):     Assets:  Desire for Improvement Housing Social Support  Sleep:  Number of Hours: 6  Cognition: WNL  ADL's:  Intact  In full contact with reality. There are no active S/S of withdrawal. There are no active SI plans or intent. He is willing and motivated to pursue outpatient treatment. He is mindful of the fact he has to be careful when ambulating not to fall. He has follow up appointments is places Mental Status Per Nursing Assessment::   On Admission:  Suicidal ideation indicated by patient  Demographic Factors:  Male  Loss Factors: Decline in physical health  Historical Factors: none identified  Risk Reduction Factors:   Sense of responsibility to family and Positive social support  Continued Clinical Symptoms:  Depression:   Comorbid alcohol abuse/dependence Alcohol/Substance Abuse/Dependencies  Cognitive Features That Contribute To Risk:  None    Suicide Risk:  Minimal: No identifiable suicidal ideation.  Patients presenting with no risk factors but with morbid ruminations; may be classified as minimal risk based on the severity of the depressive symptoms  Follow-up  Information    [facility]   Why:  Appt on this with Dr. Maebelle MunroePidva date at 2:00PM. Please bring After Visit Summary [facility].    Contact information:   [facility] [place]: Enrico Pidva  [address] [place], [place] [address] Phone: [phone] Fax: [phone]      Follow up with [facility].   Why:  Charmian MuffAnn Evans will meet with you prior to discharge to assess you for program and if appropriate, will schedule you for orientation and class.    Contact information:   [address] [place], [place] [address] Phone: [phone] Fax: [phone]      Follow up with [facility].   Why:  Clinician will contact you to schedule assessment for services after discharge. Thank you.    Contact information:   [address] [place], [place] [address] Phone: [phone] Fax: [phone]      Plan Of Care/Follow-up recommendations:  Activity:  as tolerated Diet:  regular Follow up as above Anuar Walgren A, MD 09/13/2015, 11:43 AM

## 2015-09-13 NOTE — Discharge Summary (Signed)
Physician Discharge Summary Note  Patient:  Thomas Mcneil is an 65 y.o., male MRN:  [id number] DOB:  05/16/1951 Patient phone:  [phone] (home)  Patient address:   [address] [place] [address],  Total Time spent with patient: Greater than 30 minutes  Date of Admission:  09/02/2015  Date of Discharge: 09-13-15  Reason for Admission: Alcohol intoxication/worsening symptoms of depression  Principal Problem: Severe recurrent major depression without psychotic features [facility](HCC)  Discharge Diagnoses: Patient Active Problem List   Diagnosis Date Noted  . Alcohol use disorder, severe, dependence (HCC) [F10.20] 09/04/2015  . Severe recurrent major depression without psychotic features (HCC) [F33.2] 09/04/2015  . Alcohol dependence, uncomplicated (HCC) [F10.20] 09/01/2015  . Alcohol-induced mood disorder (HCC) [F10.94] 09/01/2015  . Cocaine abuse [F14.10] 09/01/2015  . Alcohol abuse [F10.10] 04/05/2014  . Essential tremor [G25.0] 01/04/2014  . Other and unspecified hyperlipidemia [E78.5] 01/04/2014  . B12 deficiency [E53.8] 01/04/2014  . Melena [K92.1] 10/23/2012  . GERD [K21.9] 05/27/2010  . SHOULDER PAIN, LEFT [M25.519] 05/21/2010  . ONYCHOMYCOSIS, TOENAILS [B35.1] 05/09/2010  . KNEE PAIN, BILATERAL [M25.569] 06/27/2009  . LEG CRAMPS [R25.2] 06/27/2009  . Chronic hepatitis C without hepatic coma (HCC) [B18.2] 09/05/2008  . CONTUSION OF UNSPECIFIED SITE [T14.8] 08/18/2008  . LEG EDEMA, BILATERAL [R60.9] 01/13/2008  . ERECTILE DYSFUNCTION [F52.8] 10/15/2007  . TOBACCO ABUSE [F17.200] 08/16/2007  . Mononeuritis of lower limb [G57.90] 08/16/2007  . CORNS AND CALLUSES [L84] 08/16/2007  . ALCOHOL ABUSE, HX OF [F10.21] 08/16/2007   Past Psychiatric History: Major depression, Alcohol dependence  Past Medical History:  Past Medical History  Diagnosis Date  . Depression   . GERD (gastroesophageal reflux disease)   . Complication of anesthesia     slow to wake up after  last surgery  . Peripheral neuropathy (HCC)     feet and hands  . Arthritis   . Hepatitis     hep c  . High cholesterol   . High blood pressure   . Neuropathy [facility](HCC)     Past Surgical History  Procedure Laterality Date  . Tonsillectomy    . Direct laryngoscopy  12/26/2011    Procedure: DIRECT LARYNGOSCOPY;  Surgeon: Melvenia BeamMitchell Gore, MD;  [facility];  Service: ENT;  Laterality: N/A;  Directy Laryngoscopy with biopsy [id number]  . Direct laryngoscopy N/A 10/21/2012    Procedure: DIRECT LARYNGOSCOPY;  Surgeon: Melvenia BeamMitchell Gore, MD;  [facility];  Service: ENT;  Laterality: N/A;  . Esophagogastroduodenoscopy N/A 10/25/2012    Procedure: ESOPHAGOGASTRODUODENOSCOPY (EGD);  Surgeon: Charna ElizabethJyothi Mann, MD;  [facility];  Service: Endoscopy;  Laterality: N/A;   Family History:  Family History  Problem Relation Age of Onset  . Hypertension Mother   . Diverticulosis Mother   . GER disease Mother   . Lung disease Sister   . Polymyositis Sister    Family Psychiatric  History: See H&P  Social History:  History  Alcohol Use  . 42.0 oz/week  . 0 Standard drinks or equivalent, 70 Cans of beer per week    Comment: [facility] - currently in [facility] for counseling/rehab. last drink 06/29/14     History  Drug Use  . 0.20 per week    Comment: "Crack" quit 06/29/14    Social History   Social History  . Marital Status: Widowed    Spouse Name: N/A  . Number of Children: N/A  . Years of Education: N/A   Social History Main Topics  . Smoking status: Current Some  Day Smoker -- 0.25 packs/day for 45 years    Types: Cigarettes    Start date: 06/09/2012  . Smokeless tobacco: None     Comment: quitting, using e-cig  . Alcohol Use: 42.0 oz/week    0 Standard drinks or equivalent, 70 Cans of beer per week     Comment: [facility] - currently in [facility] for counseling/rehab. last drink 06/29/14  . Drug Use: 0.20 per week     Comment: "Crack" quit 06/29/14  . Sexual Activity: Not Asked   Other Topics  Concern  . None   Social History Narrative   Hospital Course: This is an admission assessment for this 65 year old AAM. Thomas Mcneil is admitted to the [facility] adult unit from the [facility] with complaints of increased alcohol consumption, cocaine use & suicidal ideations. Thomas Mcneil's blood alcohol level upon arrival to the ED was 231. During this assessment, he reports, "I called the ambulance on the 23rd day of March to take me to the hospital. I drank too much alcohol on this day. I have been drinking heavily x 50 years. The drinking worsened in the last 2 years. I drink because I like the taste of alcohol. I was drinking 2 of the 1/5th daily. I also use cocaine as well for 20 years. I have been depressed for 6 years, not knowing why I'm so depressed. I take medicine for depression, only that it is not very effective. I have had suicidal thoughts off & on for many years. I have not actually attempted suicide. I do not think my depression medicine is helping me at this point. I'm a disabled [employer]. Would like to go to residential treatment center after discharge".  Thomas Mcneil was admitted to the hospital with a blood alcohol level of 231 per toxicology tests reports & UDS test report was positive for cocaine. He was also complaining of worsening symptoms of depression/suicidal ideations as he believed his current antidepressant medication is not controlling his symptoms. He required alcohol detoxification as well as mood stabilization treatments. Thomas Mcneil also is battling other chronic medical conditions. He presented with weak gait, weak balance & generalized weakness. His detoxification treatment was achieved using Librium detox protocols.  Besides the detoxification treatments, Thomas Mcneil was also medicated & discharged on; Lexapro 5 mg for depression, Gabapentin 300 mg for agitation/substance withdrawal symptoms, Trazodone 100 mg for insomnia & Ambien 5 mg PRN for insomnia. He was resumed on all his pertinent home  medications for his other pre-existing medical issues that he presented. He tolerated his treatment regimen without any significant adverse effects and or reactions. Thomas Mcneil was also ordered a physical therapy consult for evaluation due to his lower extremity weakness. He walked with the physical therapist twice weekly for gait training, balance/strenghtening & was on 1:1 supervision for safety for most of his hospital stay. Thomas Mcneil was enrolled & participated in the group counseling sessions/AA/NA meetings being offered and held on this unit. He learned coping skills that should help him cope better after discharge to maintain sobriety & mood stability.  During the course of his hospitalization, Thomas Mcneil was noted to be motivated for recovery. He worked closely with the treatment team and case managers to develop a discharge plan with appropriate goals to maintain sobriety & mood stability. Coping skills, problem solving as well as relaxation therapies were also part of the unit programming. He completed detox treatment and his mood is stable. This is evidenced by his reports of improved mood, absence of suicidal ideations and  or substance withdrawals symptoms. Upon discharge Thomas Mcneil was in much improved condition than upon admission. His symptoms were reported as significantly decreased or resolved completely. He currently denies any SI/HI, AVH, delusional thoughts and or paranoia. He was motivated to continue taking medications with a goal of continued improvement in mental health.  He will follow-up care for further substance abuse treatment, medication management & routine psychiatric care as noted below.  He was provided with all the pertinent information required to make these appointments without problems. Thomas Mcneil [facility] in no apparent distress. Transportation per mother.  Physical Findings: AIMS: Facial and Oral Movements Muscles of Facial Expression: None, normal Lips and Perioral Area: None, normal Jaw: None,  normal Tongue: None, normal,Extremity Movements Upper (arms, wrists, hands, fingers): None, normal Lower (legs, knees, ankles, toes): None, normal, Trunk Movements Neck, shoulders, hips: None, normal, Overall Severity Severity of abnormal movements (highest score from questions above): None, normal Incapacitation due to abnormal movements: None, normal Patient's awareness of abnormal movements (rate only patient's report): No Awareness, Dental Status Current problems with teeth and/or dentures?: No Does patient usually wear dentures?: No  CIWA:  CIWA-Ar Total: 0 COWS:  COWS Total Score: 1  Musculoskeletal: Strength & Muscle Tone: within normal limits Gait & Station: normal Patient leans: N/A  Psychiatric Specialty Exam: Review of Systems  Constitutional: Negative.   HENT: Negative.   Eyes: Negative.   Respiratory: Negative.   Cardiovascular: Negative.   Gastrointestinal: Negative.   Genitourinary: Negative.   Musculoskeletal: Positive for myalgias and joint pain.       Used walker   Skin: Negative.   Neurological: Negative.   Endo/Heme/Allergies: Negative.   Psychiatric/Behavioral: Positive for depression (Stable) and substance abuse (Alcoholism, chronic). Negative for suicidal ideas, hallucinations and memory loss. The patient has insomnia (Stable). The patient is not nervous/anxious.     Blood pressure 118/69, pulse 68, temperature 98 F (36.7 C), temperature source Oral, resp. rate 16, height  (1.854 m), weight 74.844 kg (165 lb), SpO2 99 %.Body mass index is 21.77 kg/(m^2).  See Md's SRA  Have you used any form of tobacco in the last 30 days? (Cigarettes, Smokeless Tobacco, Cigars, and/or Pipes): Yes  Has this patient used any form of tobacco in the last 30 days? (Cigarettes, Smokeless Tobacco, Cigars, and/or Pipes) Yes, Yes, A prescription for an FDA-approved tobacco cessation medication was offered at discharge and the patient refused  Blood Alcohol level:  Lab  Results  Component Value Date   [facility] 231* 08/31/2015    Metabolic Disorder Labs:  No results found for: HGBA1C, MPG No results found for: PROLACTIN Lab Results  Component Value Date   CHOL 146 03/29/2014   TRIG 97 03/29/2014   HDL 41 03/29/2014   CHOLHDL 3.6 03/29/2014   VLDL 18 08/30/2008   LDLCALC 86 03/29/2014   LDLCALC 108* 08/30/2008    See Psychiatric Specialty Exam and Suicide Risk Assessment completed by Attending Physician prior to discharge.  Discharge destination:  Home  Is patient on multiple antipsychotic therapies at discharge:  No   Has Patient had three or more failed trials of antipsychotic monotherapy by history:  No  Recommended Plan for Multiple Antipsychotic Therapies: NA    Medication List    STOP taking these medications        folic acid 1 MG tablet  Commonly known as:  FOLVITE     Ledipasvir-Sofosbuvir 90-400 MG Tabs  Commonly known as:  HARVONI     neomycin-polymyxin-hydrocortisone otic solution  Commonly known as:  CORTISPORIN     primidone 50 MG tablet  Commonly known as:  MYSOLINE     thiamine 100 MG tablet  Commonly known as:  VITAMIN B-1      TAKE these medications      Indication   escitalopram 5 MG tablet  Commonly known as:  LEXAPRO  Take 3 tablets (15 mg total) by mouth daily. For depression   Indication:  Major Depressive Disorder     fluticasone 50 MCG/ACT nasal spray  Commonly known as:  FLONASE  Place 2 sprays into both nostrils daily as needed for allergies. For allergies   Indication:  Allergic Rhinitis     gabapentin 300 MG capsule  Commonly known as:  NEURONTIN  Take 1 capsule (300 mg total) by mouth 3 (three) times daily. For agitation   Indication:  Agitation     lovastatin 20 MG tablet  Commonly known as:  MEVACOR  Take 1 tablet (20 mg total) by mouth at bedtime. For High Cholesterol   Indication:  Inherited Heterozygous Hypercholesterolemia     nicotine 21 mg/24hr patch  Commonly known as:  NICODERM  CQ - dosed in mg/24 hours  Place 1 patch (21 mg total) onto the skin daily. For nicotine addiction   Indication:  Nicotine Addiction     omeprazole 40 MG capsule  Commonly known as:  PRILOSEC  Take one tablet by mouth once daily for stomach: for acid reflux   Indication:  Gastroesophageal Reflux Disease     propranolol 80 MG tablet  Commonly known as:  INDERAL  Take 1 tablet (80 mg total) by mouth 2 (two) times daily. For high blood pressure   Indication:  High Blood Pressure     traZODone 100 MG tablet  Commonly known as:  DESYREL  Take 2 tablets (200 mg total) by mouth at bedtime. For sleep   Indication:  Trouble Sleeping     vitamin C 1000 MG tablet  Take 1 tablet (1,000 mg total) by mouth daily. For Vitamin C supplementation   Indication:  Vitamin C supplement     zolpidem 5 MG tablet  Commonly known as:  AMBIEN  Take 1 tablet (5 mg total) by mouth at bedtime as needed for sleep.   Indication:  Trouble Sleeping       Follow-up Information    Follow up with [facility] On 09/20/2015.   Why:  Appt on this with Dr. Maebelle Munroe date at 2:00PM. Please bring After Visit Summary packet received by [facility] at discharge.    Contact information:   ATTNDimas Chyle  [address] [place], [place] [address] Phone: [phone] Fax: [phone]      Follow up with [facility].   Contact information:   [address] [place], [place] [address] Phone: [phone] Fax: [phone]      Follow up with [facility].   Why:  Clinician will contact you to schedule assessment for services after discharge. Thank you.    Contact information:   [address], [place] [address] Phone: [phone] Fax: [phone]     Follow-up recommendations: Activity:  As tolerated Diet: As recommended by your primary care doctor. Keep all scheduled follow-up appointments as recommended.   Comments: Take all your medications as  prescribed by your mental healthcare provider. Report any adverse effects and or reactions from your medicines to your outpatient provider promptly. Patient is instructed and cautioned to not engage in alcohol and  or illegal drug use while on prescription medicines. In the event of worsening symptoms, patient is instructed to call the crisis hotline, 911 and or go to the nearest ED for appropriate evaluation and treatment of symptoms. Follow-up with your primary care provider for your other medical issues, concerns and or health care needs.  Signed: Sanjuana Kava, NP, PMHNP-BC 09/13/2015, 9:50 AM  I personally assessed the patient and formulated the plan Madie Reno A. Dub Mikes, M.D.

## 2015-09-13 NOTE — Tx Team (Addendum)
Interdisciplinary Treatment Plan Update (Adult)  Date:  09/13/2015  Time Reviewed:  10:37 AM   Progress in Treatment: Attending groups: Yes Participating in groups:  Yes Taking medication as prescribed:  Yes. Tolerating medication:  Yes. Family/Significant other contact made:  Yes, CSW has spoken with patient's sister Patient understands diagnosis:  Yes. AEB seeking treatment for SI, depression, alcohol/cocaine abuse, and for medication stabilization. Discussing patient identified problems/goals with staff:  Yes. Medical problems stabilized or resolved:  Yes. Denies suicidal/homicidal ideation: Yes. Issues/concerns per patient self-inventory:  Other:  Discharge Plan or Barriers: Patient plans to return to previous living situation to follow up with [facility]. He has been referred to [facility] Brandon Melnick) for assessment. Pt also referred to [facility] for assessment. Pt continues to be unsteady on his feet. His sister offered to stay with patient while he continues to recovery at home; however pt has stated that he does not want anyone staying with him. He is open to referral (that has already been made) to [facility] for an assessment.   Reason for Continuation of Hospitalization: none  Comments:  Thomas Mcneil is an 65 y.o. male. Pt called 911 this morning and made reference to suicide. See previous notes: pt would not open door and behaved oddly, per EMS. Pt tells TTS that he has been feeling depressed and is not taking his psych meds, which he gets from [facility], because he has missed his appointments. Pt also reports that he has been drinking 1-3 fifths of wine/liquor daily for up to 2 1/2 years, along with weekly cocaine use. Pt reports he drinks enough that "I don't feel anything." Pt initially denied SI but then said "if I was suicidal, I wouldn't tell you."Pt was agitated when asked about guns and said he had guns but then said they were not at his house. Pt  did report he gets the shakes when he stops drinking. TTS spoke with pt's mother who was with pt at [facility]. She reported that pt called her this morning as he normally does but did not report anything unusual. Pt then called her later in the day and told her that he was in the hospital. Pt has mentioned feeling depressed to his mother and she has noticed that when she sees him he usually smells of alcohol. Pt has not made any mention of SI or HI to her. Diagnosis: depression, alcohol use  Estimated length of stay:  D/c today  Additional Comments:  Patient and CSW reviewed pt's identified goals and treatment plan. Patient verbalized understanding and agreed to treatment plan. CSW reviewed [facility] "Discharge Process and Patient Involvement" Form. Pt verbalized understanding of information provided and signed form.    Review of initial/current patient goals per problem list:  1. Goal(s): Patient will participate in aftercare plan  Met: Yes  Target date: at discharge  As evidenced by: Patient will participate within aftercare plan AEB aftercare provider and housing plan at discharge being identified.  3/28: CSW assessing for appropriate referrals.  3/31: Goal met. Patient plans to return to previous living situation to follow up with [facility] and referrals made to [facility] (pt not interested in SNF referral), and [facility] per his request.  2. Goal (s): Patient will exhibit decreased depressive symptoms and suicidal ideations.  Met: Yes   Target date: at discharge  As evidenced by: Patient will utilize self rating of depression at 3 or below and demonstrate decreased signs of depression or be  deemed stable for discharge by MD.  3/28: Pt rates depression as high but currently denies SI/HI/AVH.   3/31: Goal progressing. Patient continues to isolate and does not participate in programming.  4/6: Pt rates depression as low today. Denies SI/HI/AVH.   3. Goal(s): Patient  will demonstrate decreased signs of withdrawal due to substance abuse  Met:Yes  Target date:at discharge   As evidenced by: Patient will produce a CIWA/COWS score of 0, have stable vitals signs, and no symptoms of withdrawal.  3/28: Pt reports moderate/severe withdrawals with latest CIWA score of 11 and stable vitals.  3/31: Patient with CIWA score of 5, experiencing tremor and anxiety. 4/6: Pt has CIWA of 0 and stable vitals. Continued weakness; goal adequate for discharge per MD.    Attendees: Patient:   09/13/2015 10:37 AM   Family:   09/13/2015 10:37 AM   Physician:  Dr. Carlton Adam, MD 09/13/2015 10:37 AM   Nursing:  Raechel Ache RN 09/13/2015 10:37 AM   Clinical Social Worker: Maxie Better, LCSW 09/13/2015 10:37 AM   Clinical Social Worker:Lauren Madie Reno 09/13/2015 10:37 AM   Other:  Gerline Legacy Nurse Case Manager 09/13/2015 10:37 AM   Other:  Agustina Caroli, May Augustin, NP 09/13/2015 10:37 AM   Other:   09/13/2015 10:37 AM   Other:  09/13/2015 10:37 AM   Other:  09/13/2015 10:37 AM   Other:  09/13/2015 10:37 AM    Maxie Better, MSW, LCSW Clinical Social Worker 09/13/2015 10:42 AM

## 2015-09-13 NOTE — Progress Notes (Signed)
D: Patient resting in bed with eyes closed.  Respirations even and unlabored.  Patient appears to be in no apparent distress.  Patient on 1:1 for fall risk. A: Staff to monitor Q 15 mins for safety.  Patient remains on 1:! For fall risk safety. R:Patient remains safe on the unit.

## 2015-09-13 NOTE — Progress Notes (Signed)
1:1 Note: Patient maintained on constant supervision for safety.  No behavioral issues noted.  Patient ambulating on the unit with walker without difficulty.  Medication given as prescribed.  Patient up and in the dayroom.  Voiced no concern or complaints.

## 2015-09-13 NOTE — Progress Notes (Signed)
  [facility] Discharge Plan :  Will you be returning to the same living situation after discharge:  Yes,  home  At discharge, do you have transportation home?: Yes,  mother; friend Do you have the ability to pay for your medications: Yes,  [employer] Medicare  Release of information consent forms completed and in the chart;  Patient's signature needed at discharge.  Patient to Follow up at: Follow-up Information    [facility].   Why:  Appt on this with Dr. Maebelle MunroePidva date at 2:00PM. Please bring After Visit Summary packet [facility] at discharge.    Contact information:   ATTDimas Chyle: Enrico Pidva  [address] [place], [place] [address] Phone: [phone] Fax: [phone]      Follow up with [facility].   Why:  Charmian MuffAnn Evans will meet with you prior to discharge to assess you for program and if appropriate, will schedule you for orientation and class.    Contact information:   [address] [place], [place] [address] Phone: [phone] Fax: [phone]      Follow up with [facility].   Why:  Clinician will contact you to schedule assessment for services after discharge. Thank you.    Contact information:   [address] [place], [place] [address] Phone: [phone] Fax: [phone]      Next level of care provider has access to [facility] Link:no  Safety Planning and Suicide Prevention discussed: Yes,  SPE completed with pt's sister. SPI pamphlet and mobile crisis information provided to patient and he was encouraged to share information with support network.  Have you used any form of tobacco in the last 30 days? (Cigarettes, Smokeless Tobacco, Cigars, and/or Pipes): Yes  Has patient been referred to the Quitline?: Patient refused referral  Patient has been referred for addiction treatment: Yes-see above.   Smart, Murray Durrell LCSW 09/13/2015, 10:35 AM

## 2015-09-13 NOTE — [facility] Group Notes (Signed)
[facility] Group Notes:  (Nursing/MHT/Case Management/Adjunct)  Date:  09/13/2015  Time:  12:22 PM  Type of Therapy:  Nurse Education  Participation Level:  Active  Participation Quality:  Appropriate and Attentive  Affect:  Appropriate  Cognitive:  Alert and Appropriate  Insight:  Appropriate, Good and Improving  Engagement in Group:  Engaged and Improving  Modes of Intervention:  Discussion and Education    Summary of Progress/Problems: Topic was on leisure and lifestyle changes. Discussed the importance of choosing a healthy leisure activities. Group encouraged to surround themselves with positive and healthy group/support system when changing to a healthy lifestyle. Patient was receptive and contributed.   Mickie Baillizabeth O Iwenekha 09/13/2015, 12:22 PM

## 2015-09-13 NOTE — Progress Notes (Signed)
Patient discharged home with presecriptions. Patient was stable and appreciative at that time. All papers and prescriptions were given and valuables returned. Verbal understanding expressed. Denies SI/HI and A/VH. Patient given opportunity to express concerns and ask questions.

## 2015-09-13 NOTE — Progress Notes (Signed)
1:1 Note: Patient maintained on constant supervision for safety.  Patient alert and ambulatory with walker without difficulty.  Patient pleasant and cooperative.  Medication given as prescribed.  Denies suicidal ideation, auditory and visual hallucinations.  Support and encouragement offered as needed.  Patient attended group and participated.

## 2015-11-16 ENCOUNTER — Ambulatory Visit (HOSPITAL_COMMUNITY): Payer: Commercial Managed Care - HMO | Admitting: Psychology

## 2015-11-16 ENCOUNTER — Encounter (HOSPITAL_COMMUNITY): Payer: Self-pay | Admitting: Psychology

## 2015-11-19 ENCOUNTER — Other Ambulatory Visit (HOSPITAL_COMMUNITY): Payer: Commercial Managed Care - HMO | Attending: Psychiatry | Admitting: Psychology

## 2015-11-19 ENCOUNTER — Other Ambulatory Visit (HOSPITAL_COMMUNITY): Payer: Self-pay | Admitting: Medical

## 2015-11-19 DIAGNOSIS — F1020 Alcohol dependence, uncomplicated: Secondary | ICD-10-CM | POA: Insufficient documentation

## 2015-11-19 DIAGNOSIS — G4700 Insomnia, unspecified: Secondary | ICD-10-CM | POA: Insufficient documentation

## 2015-11-19 DIAGNOSIS — G629 Polyneuropathy, unspecified: Secondary | ICD-10-CM | POA: Insufficient documentation

## 2015-11-19 DIAGNOSIS — I10 Essential (primary) hypertension: Secondary | ICD-10-CM | POA: Insufficient documentation

## 2015-11-19 DIAGNOSIS — K219 Gastro-esophageal reflux disease without esophagitis: Secondary | ICD-10-CM | POA: Insufficient documentation

## 2015-11-19 DIAGNOSIS — F17210 Nicotine dependence, cigarettes, uncomplicated: Secondary | ICD-10-CM | POA: Diagnosis not present

## 2015-11-19 DIAGNOSIS — F1420 Cocaine dependence, uncomplicated: Secondary | ICD-10-CM | POA: Insufficient documentation

## 2015-11-20 ENCOUNTER — Encounter (HOSPITAL_COMMUNITY): Payer: Self-pay | Admitting: Psychology

## 2015-11-20 NOTE — Progress Notes (Signed)
    Daily Group Progress Note  Program: CD-IOP   Group Time: 1-2:30  Participation Level: Minimal  Behavioral Response: Appropriate and Sharing  Type of Therapy: Process Group  Topic: Counselors met with patients for group process session. Patients discussed their recovery from mind-altering drugs and alcohol. Drug tests were collected from multiple patients. One [employer] student was present and observed group. Two new members were present for group today and shared briefly about the events that led them to seek tx. One group member was not present due to prior excused friend's funeral attendance. One group member called early to notify leader that she would be absent due to food-borne illness. Patient's absence was noted as "excused".    Group Time: 2:45-4  Participation Level: Active  Behavioral Response: Appropriate and Sharing  Type of Therapy: Psycho-education Group  Topic: Counselors met with patients for group psychoeducation session. Topics included "how to dig deep emotionally in group". Counselors emphasized that patients experience of tx will depend on their engagement. Patients discussed AA meetings and rules for step work, sponsorship, and [employer].    Summary: Sobriety date is 6/7 Patient presented to his first group counseling session today from 1-4pm. Patient arrived 20 mins early and sat quietly by himself. Patient was quiet for most of session but listened with seeming intent. He was invited by the counselor to share about what led him to seek tx in CD-IOP. He shared that he has struggled with alcohol and other drugs for "nearly his whole life". He has had 6 DUI's and no longer has a license. He shared about his peripheral neuropathy which causes him slight tremors in his hands and feet. Patient shared that he "hopes to live differently" and felt "a sense of hope for his future after talking to the counselor individually." Patient has a long and  extensive hx of drug/alcohol use and tx. Counselor expressed to pt a desire to "try to do something different" with this tx than patient has received in the past. Youlanda Roys, Counselor   Family Program: Family present? NA   Name of family member(s):   UDS collected: No Results:   AA/NA attended?: No  Sponsor?: No   Lavanya Roa, LCAS

## 2015-11-21 ENCOUNTER — Other Ambulatory Visit (HOSPITAL_COMMUNITY): Payer: Commercial Managed Care - HMO | Admitting: Psychology

## 2015-11-21 DIAGNOSIS — G4700 Insomnia, unspecified: Secondary | ICD-10-CM | POA: Diagnosis not present

## 2015-11-21 DIAGNOSIS — F1020 Alcohol dependence, uncomplicated: Secondary | ICD-10-CM

## 2015-11-21 DIAGNOSIS — G629 Polyneuropathy, unspecified: Secondary | ICD-10-CM | POA: Diagnosis not present

## 2015-11-21 DIAGNOSIS — I10 Essential (primary) hypertension: Secondary | ICD-10-CM | POA: Diagnosis not present

## 2015-11-21 DIAGNOSIS — K219 Gastro-esophageal reflux disease without esophagitis: Secondary | ICD-10-CM | POA: Diagnosis not present

## 2015-11-21 DIAGNOSIS — F17210 Nicotine dependence, cigarettes, uncomplicated: Secondary | ICD-10-CM | POA: Diagnosis not present

## 2015-11-21 DIAGNOSIS — F1420 Cocaine dependence, uncomplicated: Secondary | ICD-10-CM | POA: Diagnosis not present

## 2015-11-22 ENCOUNTER — Other Ambulatory Visit (HOSPITAL_COMMUNITY): Payer: Commercial Managed Care - HMO | Admitting: Psychology

## 2015-11-22 ENCOUNTER — Ambulatory Visit (HOSPITAL_COMMUNITY): Payer: Commercial Managed Care - HMO | Admitting: Psychology

## 2015-11-22 DIAGNOSIS — G629 Polyneuropathy, unspecified: Secondary | ICD-10-CM | POA: Diagnosis not present

## 2015-11-22 DIAGNOSIS — G4700 Insomnia, unspecified: Secondary | ICD-10-CM | POA: Diagnosis not present

## 2015-11-22 DIAGNOSIS — F1420 Cocaine dependence, uncomplicated: Secondary | ICD-10-CM | POA: Diagnosis not present

## 2015-11-22 DIAGNOSIS — K219 Gastro-esophageal reflux disease without esophagitis: Secondary | ICD-10-CM | POA: Diagnosis not present

## 2015-11-22 DIAGNOSIS — F1020 Alcohol dependence, uncomplicated: Secondary | ICD-10-CM | POA: Diagnosis not present

## 2015-11-22 DIAGNOSIS — I10 Essential (primary) hypertension: Secondary | ICD-10-CM | POA: Diagnosis not present

## 2015-11-22 DIAGNOSIS — F17210 Nicotine dependence, cigarettes, uncomplicated: Secondary | ICD-10-CM | POA: Diagnosis not present

## 2015-11-26 ENCOUNTER — Encounter (HOSPITAL_COMMUNITY): Payer: Self-pay | Admitting: Medical

## 2015-11-26 ENCOUNTER — Encounter (HOSPITAL_COMMUNITY): Payer: Self-pay | Admitting: Psychology

## 2015-11-26 ENCOUNTER — Other Ambulatory Visit (HOSPITAL_COMMUNITY): Payer: Self-pay | Admitting: Medical

## 2015-11-26 ENCOUNTER — Other Ambulatory Visit (HOSPITAL_COMMUNITY): Payer: Commercial Managed Care - HMO | Admitting: Psychology

## 2015-11-26 VITALS — BP 130/82 | HR 62 | Ht 73.0 in | Wt 180.6 lb

## 2015-11-26 DIAGNOSIS — K219 Gastro-esophageal reflux disease without esophagitis: Secondary | ICD-10-CM | POA: Diagnosis not present

## 2015-11-26 DIAGNOSIS — F1994 Other psychoactive substance use, unspecified with psychoactive substance-induced mood disorder: Secondary | ICD-10-CM

## 2015-11-26 DIAGNOSIS — F1420 Cocaine dependence, uncomplicated: Secondary | ICD-10-CM | POA: Diagnosis not present

## 2015-11-26 DIAGNOSIS — G252 Other specified forms of tremor: Secondary | ICD-10-CM | POA: Insufficient documentation

## 2015-11-26 DIAGNOSIS — F1020 Alcohol dependence, uncomplicated: Secondary | ICD-10-CM | POA: Diagnosis not present

## 2015-11-26 DIAGNOSIS — F17210 Nicotine dependence, cigarettes, uncomplicated: Secondary | ICD-10-CM | POA: Diagnosis not present

## 2015-11-26 DIAGNOSIS — I10 Essential (primary) hypertension: Secondary | ICD-10-CM | POA: Diagnosis not present

## 2015-11-26 DIAGNOSIS — G4700 Insomnia, unspecified: Secondary | ICD-10-CM | POA: Diagnosis not present

## 2015-11-26 DIAGNOSIS — G629 Polyneuropathy, unspecified: Secondary | ICD-10-CM | POA: Diagnosis not present

## 2015-11-26 MED ORDER — VENLAFAXINE HCL ER 75 MG PO CP24: ORAL_CAPSULE | ORAL | Status: DC

## 2015-11-26 MED ORDER — ACAMPROSATE CALCIUM 333 MG PO TBEC: 666.0000 mg | DELAYED_RELEASE_TABLET | Freq: Three times a day (TID) | ORAL | Status: DC
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
## 2015-11-26 NOTE — Progress Notes (Signed)
    Daily Group Progress Note  Program: CD-IOP   Group Time: 1-2:30  Participation Level: Active  Behavioral Response: Appropriate and Sharing  Type of Therapy: Process Group  Topic: Counselors met with patients for group process session. Patients discussed their recovery from mind-altering drugs and alcohol. All patients were active and engaged in session. One patient graduated from the program.     Group Time: 2:45-4  Participation Level: Active  Behavioral Response: Appropriate and Sharing  Type of Therapy: Psycho-education Group  Topic: Counselors met with patients for group psychoeducation session. Counselors taught group about addiction and the brain. Patients strategized about external and internal triggers that cause them to use. Counselors helped patients identify their individual triggers.   Summary: Sobriety date remains 6/7 Patient presented to group counseling today from 1-4pm. Patient was active and engage in session. Patient shared that he was feeling hopeful and good today. Patient shared that he had not been to any AA or NA meetings but that he had made plans to go this weekend. Patient discussed his need to take care of his elderly mother and counselor reflected that he needed to care for his disease of addiction first and foremost. Patient seemed ambivalent about which was more important. Patient seems to reflect a moderate level of internal motivation to change. Youlanda Roys, Counselor   Family Program: Family present? NA   Name of family member(s):   UDS collected: No Results: negative  AA/NA attended?: No  Sponsor?: No   Raye Slyter, LCAS
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
## 2015-11-26 NOTE — Progress Notes (Signed)
    Daily Group Progress Note  Program: CD-IOP   Group Time: 1-2:30 pm  Participation Level: Active  Behavioral Response: Sharing  Type of Therapy: Process Group  Topic: Process; the first half of group was spent in process. After check-in, a brief meditation was held, with members mindfully focusing on their breath while listening to a meditation from [email or web address]-page.info/. Members shared about challenges and struggles in early recovery and what they had done to strengthen their recovery. Random drug tests were collected and the medical director met with 2 group members during the session.   Group Time: 2:45-4 pm  Participation Level: Active  Behavioral Response: Appropriate  Type of Therapy: Psycho-education Group  Topic: Psycho-Ed. The second half of group was spent in a psycho-ed. The topic was the "Neurobiology of Addiction". The session focused on educating group members on the biological nature of their addictions and the source of the biology to be found in their brain chemistry. A brief video was shown explaining this chemical imbalance and members expressed relief and a sense to acceptance now that they have a better understanding of how they got here. The discussion was lively with valuable feedback and disclosure among group members.  Summary: The patient reported he had spent most of Tuesday at the [facility] hospital in [place]. He had met with his psychiatrist. He admitted he hadn't gone to an [place] meeting yet, but was working to get a ride to some meetings this weekend. When asked how he had experienced the meditation held after the quick check-in, the patient reported he didn't really know what to do and had never really done that before. The patient was attentive and reported he had found the video on the genetic aspects of addiction to be very helpful. He reported he had been in many treatment facilities, but had not understood that it was partly his genes. He remains alcohol and  drug-free with a sobriety date of 6/7.   Family Program: Family present? No   Name of family member(s):   UDS collected: No Results:   AA/NA attended?: No  Sponsor?: No   Brielyn Bosak, LCAS

## 2015-11-27 ENCOUNTER — Other Ambulatory Visit (HOSPITAL_COMMUNITY): Payer: Commercial Managed Care - HMO

## 2015-11-28 ENCOUNTER — Encounter (HOSPITAL_COMMUNITY): Payer: Self-pay | Admitting: Psychology

## 2015-11-28 ENCOUNTER — Other Ambulatory Visit (HOSPITAL_COMMUNITY): Payer: Commercial Managed Care - HMO | Admitting: Medical

## 2015-11-28 DIAGNOSIS — F1420 Cocaine dependence, uncomplicated: Secondary | ICD-10-CM | POA: Diagnosis not present

## 2015-11-28 DIAGNOSIS — F1020 Alcohol dependence, uncomplicated: Secondary | ICD-10-CM

## 2015-11-28 DIAGNOSIS — G4700 Insomnia, unspecified: Secondary | ICD-10-CM | POA: Diagnosis not present

## 2015-11-28 DIAGNOSIS — F17210 Nicotine dependence, cigarettes, uncomplicated: Secondary | ICD-10-CM | POA: Diagnosis not present

## 2015-11-28 DIAGNOSIS — I10 Essential (primary) hypertension: Secondary | ICD-10-CM | POA: Diagnosis not present

## 2015-11-28 DIAGNOSIS — G629 Polyneuropathy, unspecified: Secondary | ICD-10-CM | POA: Diagnosis not present

## 2015-11-28 DIAGNOSIS — K219 Gastro-esophageal reflux disease without esophagitis: Secondary | ICD-10-CM | POA: Diagnosis not present

## 2015-11-28 LAB — PRESCRIPTION ABUSE MONITORING 17P, URINE
6-Acetylmorphine, Urine: NEGATIVE ng/mL
AMPHETAMINE SCREEN URINE: NEGATIVE ng/mL
BENZODIAZEPINE SCREEN, URINE: NEGATIVE ng/mL
Buprenorphine, Urine: NEGATIVE ng/mL
CANNABINOIDS UR QL SCN: NEGATIVE ng/mL
COCAINE(METAB.)SCREEN, URINE: NEGATIVE ng/mL
CREATININE(CRT), U: 95 mg/dL (ref 20.0–300.0)
Carisoprodol/Meprobamate, Ur: NEGATIVE ng/mL
EDDP, Urine: NEGATIVE ng/mL
Fentanyl, Urine: NEGATIVE pg/mL
MDMA Screen, Urine: NEGATIVE ng/mL
METHADONE SCREEN, URINE: NEGATIVE ng/mL
Meperidine Screen, Urine: NEGATIVE ng/mL
Nitrite Urine, Quantitative: NEGATIVE ug/mL
OXYCODONE+OXYMORPHONE UR QL SCN: NEGATIVE ng/mL
Opiate Scrn, Ur: NEGATIVE ng/mL
PH UR, DRUG SCRN: 5.5 (ref 4.5–8.9)
PROPOXYPHENE SCREEN URINE: NEGATIVE ng/mL
Phencyclidine Qn, Ur: NEGATIVE ng/mL
SPECIFIC GRAVITY: 1.014
TAPENTADOL, URINE: NEGATIVE ng/mL
Tramadol Screen, Urine: NEGATIVE ng/mL

## 2015-11-28 LAB — BARBITURATE (GC/MS), URINE
AMOBARBITAL: NEGATIVE
BARBITURATES: POSITIVE — AB
BUTALBITAL: NEGATIVE
PENTOBARBITAL: NEGATIVE
PHENOBARBITAL: POSITIVE — AB
Phenobarbital: 371 ng/mL
SECOBARBITAL: NEGATIVE

## 2015-11-28 LAB — ETHYL GLUCURONIDE, URINE: ETHYL GLUCURONIDE SCREEN, UR: NEGATIVE ng/mL

## 2015-11-28 NOTE — Progress Notes (Signed)
    Daily Group Progress Note  Program: CD-IOP   Group Time: 1-2:30 pm  Participation Level: Active  Behavioral Response: Appropriate and Sharing  Type of Therapy: Process Group  Topic: Process:  The first part of group was spent in process. Members shared about what they had done over the weekend to support their recovery. They also identified any challenges or temptations that may have presented themselves since we last met. One member checked-in with a new sobriety date of today. He proceeded to share the events of the weekend, including a return to use on Friday and then again on Sunday. He was filled with remorse as he described these events.  Much of the remainder of this session was spent processing the relapses. The medical director met with 2 group members today and random drug tests were collected from 6 group members.   Group Time: 2:45-4 PM  Participation Level: minimal  Behavioral Response: Appropriate  Type of Therapy: Psycho-education Group  Topic: Psycho-Ed: Relapse Prevention. The second half of group was spent in a psycho-ed. it included a utube video about Baclofen, which reduces cocaine cravings. After discussing the video, group members were given a handout entitled, "Be Smart, Not Strong" from the [employer]. It followed on the importance of avoiding triggers and potentially dangerous relapse situations. The focus was not on using willpower, which doesn't work, but setting one's self up for Sobriety.   Summary: The patient reported he a had attended 2 NA meetings over the weekend. Both were held at the [facility] near [place]. He was greeted warmly by his old friends in the program and admitted it was good to be back. He showed the group the white key chain he had picked up at one of the meetings. The patient met with the program director for much of the second half of group and returned shortly before the session ended. He had been instructed to go to some  meetings in keeping with the program policies and had concurred with the orders. He remains alcohol and drug-free with a sobriety date of 6/7.   Family Program: Family present? No   Name of family member(s):   UDS collected: Yes Results: pending  AA/NA attended?: YesSaturday and Sunday  Sponsor?: No   Deamber Buckhalter, LCAS

## 2015-11-29 ENCOUNTER — Other Ambulatory Visit (HOSPITAL_COMMUNITY): Payer: Commercial Managed Care - HMO | Admitting: Psychology

## 2015-11-29 ENCOUNTER — Encounter (HOSPITAL_COMMUNITY): Payer: Self-pay | Admitting: Medical

## 2015-11-29 DIAGNOSIS — G629 Polyneuropathy, unspecified: Secondary | ICD-10-CM | POA: Diagnosis not present

## 2015-11-29 DIAGNOSIS — G4700 Insomnia, unspecified: Secondary | ICD-10-CM | POA: Diagnosis not present

## 2015-11-29 DIAGNOSIS — F1020 Alcohol dependence, uncomplicated: Secondary | ICD-10-CM | POA: Diagnosis not present

## 2015-11-29 DIAGNOSIS — F1420 Cocaine dependence, uncomplicated: Secondary | ICD-10-CM

## 2015-11-29 DIAGNOSIS — I10 Essential (primary) hypertension: Secondary | ICD-10-CM | POA: Diagnosis not present

## 2015-11-29 DIAGNOSIS — K219 Gastro-esophageal reflux disease without esophagitis: Secondary | ICD-10-CM | POA: Diagnosis not present

## 2015-11-29 DIAGNOSIS — F17210 Nicotine dependence, cigarettes, uncomplicated: Secondary | ICD-10-CM | POA: Diagnosis not present

## 2015-11-29 NOTE — Progress Notes (Signed)
    Daily Group Progress Note  Program: CD-IOP   Group Time: 1-2:30  Participation Level: Active  Behavioral Response: Appropriate and Sharing  Type of Therapy: Psycho-education Group  Topic: Counselors met with patients for group process session. Patients discussed their recovery from mind-altering drugs and alcohol. Patients were active and engaged. One patient was excused absent for a prescheduled vacation. Drug test was collected from one patient who missed group on Monday. Patients briefly checked in with their sobriety dates and any challenges they had faced since last meeting in CD-IOP. 2 Patients met with Investment banker, operational.     Group Time: 2:45-4  Participation Level: Active  Behavioral Response: Appropriate and Sharing  Type of Therapy: Psycho-education Group  Topic: Counselors met with patients for group psychoeducation session. The topic included relapse prevention techniques and developing a relapse prevention plan. Counselor showed a video describing the 3 stages of relapse: emotional, mental, and physical. Patients were encouraged to think critically about their experiences with relapsing and how they could avoid their triggers before they are physically tempted to use.   Summary: Sobriety date remains 6/7 Patient presented for group today from 1-4pm. He was somewhat active and not very engaged in group. He listened to other's share with seeming intention but he did not add his own thoughts or feelings to the group discussion. He did check in and say that he had not gone to an [place] meeting but that he was feeling angry after group last time since he was "kept late by the counselor and Investment banker, operational". He chuckled and admitted that he was angry but had "gotten over it". Counselor encouraged him to use good communication skills to let others know when he needs to catch the bus or be somewhere at a certain time. Patient agreed and said that he would try to use better  communication next time the situation happens. Youlanda Roys, Counselor   Family Program: Family present? NA   Name of family member(s):   UDS collected: No Results: negative  AA/NA attended?: No  Sponsor?: No   Darlyne Russian, PA-C

## 2015-12-03 ENCOUNTER — Encounter (HOSPITAL_COMMUNITY): Payer: Self-pay | Admitting: Psychology

## 2015-12-03 ENCOUNTER — Other Ambulatory Visit (HOSPITAL_COMMUNITY): Payer: Commercial Managed Care - HMO

## 2015-12-03 ENCOUNTER — Ambulatory Visit (HOSPITAL_COMMUNITY)
Admission: RE | Admit: 2015-12-03 | Discharge: 2015-12-03 | Disposition: A | Discharge status: Home / Self Care | Payer: Commercial Managed Care - HMO | Source: Ambulatory Visit | Attending: Medical | Admitting: Medical

## 2015-12-03 DIAGNOSIS — G319 Degenerative disease of nervous system, unspecified: Secondary | ICD-10-CM | POA: Insufficient documentation

## 2015-12-03 DIAGNOSIS — R251 Tremor, unspecified: Secondary | ICD-10-CM | POA: Diagnosis not present

## 2015-12-03 DIAGNOSIS — G252 Other specified forms of tremor: Secondary | ICD-10-CM

## 2015-12-03 LAB — POCT I-STAT CREATININE: Creatinine, Ser: 1.4 mg/dL — ABNORMAL HIGH (ref 0.61–1.24)

## 2015-12-03 MED ORDER — GADOBENATE DIMEGLUMINE 529 MG/ML IV SOLN: 20.0000 mL | Freq: Once | INTRAVENOUS | Status: DC | PRN

## 2015-12-03 NOTE — Progress Notes (Signed)
    Daily Group Progress Note  Program: CD-IOP   Group Time: 1-2:30 pm  Participation Level: Active  Behavioral Response: Appropriate and Sharing  Type of Therapy: Process Group  Topic: Process: The first half of group was spent in process. Members shared about current issues and concerns that they are facing in early recovery. The disclosures were compelling and members were engaged and active in the session. One member reported he had gone into his car, where he used to use frequently, and found a "torch" (a lighter) that he had used to smoke drugs. It had stirred up some powerful emotion and he had sought out others in recovery to reassure him. Drug test results were returned to group members and everyone had reportedly remained abstinent since we last met.   Group Time: 2:45-4 pm  Participation Level: Active  Behavioral Response: Appropriate  Type of Therapy: Psycho-education Group  Topic: Psycho-Ed: Living Life on Life's Terms/Serenity Prayer; the second half of group was spent in process. Members were asked to explain what the "Living Life.." means to them. While some articulated its meaning quite succinctly, other members really didn't understand how this might relate to their recovery. This led into a discussion on the Serenity Prayer with members completing a handout asking them to identify what they can and cannot change. This session was intense and challenged members to examine their lives and perspective very seriously.    Summary: Sobriety Date = 6/7. The patient reported he had spent time with his sister, who is visiting from [place]. She stays with their mother here in [place] when she comes. The patient reported that while his sister was the "bookworm", "I was the troublemaker'. The patient reported he had gotten a free meal from her and they had gone to [employer]. he delighted in his meal and raved about how good it had been. The patient reported they had gotten take out  because he can't hold the spoon without spilling or dropping the food due to his tremors. It is too awkward and embarrassing to eat in public for this man. He went to sleep in his chair and admitted he didn't wake up until this morning. The patient was quick to note he had showered and changed clothes before heading for his group session. The patient expressed gratitude towards his fellow group members and for their support. He admitted that it is good to have a place to come to. During the psycho-ed, the patient identified the things he cannot change as the weather, other people, or his past. He identified things he could change as including: his behaviors, his health, but taking better care of himself and whether he drinks or drugs.  The patient responded well to this intervention and remains alcohol and drug-free since entering the program.   Family Program: Family present? No   Name of family member(s):   UDS collected: No Results:   AA/NA attended?: No  Sponsor?: No   Pauletta Pickney, LCAS

## 2015-12-03 NOTE — Progress Notes (Signed)
Thomas Mcneil is a 65 y.o. male patient. CD-IOP: Excused Absence. The patient came to group today, but signalled he had to leave about an hour into the session. I walked with him outside the grrup room and he explained that [facility]. Our program director, Maryjean Mornharles Kober, had written an order for this procedure, but I had not been informed as to the day or time. As a result, this patient will be excused from group today and should return on Wednesday.         [name], LCAS

## 2015-12-05 ENCOUNTER — Encounter (HOSPITAL_COMMUNITY): Payer: Self-pay | Admitting: Medical

## 2015-12-05 ENCOUNTER — Other Ambulatory Visit (HOSPITAL_COMMUNITY): Payer: Commercial Managed Care - HMO | Admitting: Psychology

## 2015-12-05 DIAGNOSIS — G629 Polyneuropathy, unspecified: Secondary | ICD-10-CM | POA: Diagnosis not present

## 2015-12-05 DIAGNOSIS — F17210 Nicotine dependence, cigarettes, uncomplicated: Secondary | ICD-10-CM | POA: Diagnosis not present

## 2015-12-05 DIAGNOSIS — K219 Gastro-esophageal reflux disease without esophagitis: Secondary | ICD-10-CM | POA: Diagnosis not present

## 2015-12-05 DIAGNOSIS — G252 Other specified forms of tremor: Secondary | ICD-10-CM

## 2015-12-05 DIAGNOSIS — F1020 Alcohol dependence, uncomplicated: Secondary | ICD-10-CM

## 2015-12-05 DIAGNOSIS — F1420 Cocaine dependence, uncomplicated: Secondary | ICD-10-CM

## 2015-12-05 DIAGNOSIS — G4700 Insomnia, unspecified: Secondary | ICD-10-CM | POA: Diagnosis not present

## 2015-12-05 DIAGNOSIS — F1994 Other psychoactive substance use, unspecified with psychoactive substance-induced mood disorder: Secondary | ICD-10-CM

## 2015-12-05 DIAGNOSIS — I10 Essential (primary) hypertension: Secondary | ICD-10-CM | POA: Diagnosis not present

## 2015-12-05 MED ORDER — PREGABALIN 150 MG PO CAPS: 150.0000 mg | ORAL_CAPSULE | Freq: Two times a day (BID) | ORAL | Status: DC

## 2015-12-05 NOTE — Progress Notes (Signed)
Patient ID: Thomas Mcneil, male   DOB: 07/09/1950, 65 y.o.   MRN: [id number] S-Pt seen for FU on MRI for his Intention tremor/neuropathy of hands.C/o pain despite Neurontin.Opiates not an option now.Taking Ensure and Vits [facility] [place] [place] to > [place] O-MRI negative to explain symptoms     Exam stable/unchanged A- LE paresthesia/neuropathy conssitent with ETOH abuse      UE/hand neuropathy-?etiology P-Continue Therapeutic B complex/multivits/Ensure type supplements to insure adequate nutrit     Copy MRI report given      Trial of Lyrica  -pt cautioned CIV warning      CONTINUE CD-IOP      FU PRN

## 2015-12-06 ENCOUNTER — Other Ambulatory Visit (HOSPITAL_COMMUNITY): Payer: Commercial Managed Care - HMO | Admitting: Psychology

## 2015-12-06 DIAGNOSIS — F1020 Alcohol dependence, uncomplicated: Secondary | ICD-10-CM | POA: Diagnosis not present

## 2015-12-06 DIAGNOSIS — G4700 Insomnia, unspecified: Secondary | ICD-10-CM | POA: Diagnosis not present

## 2015-12-06 DIAGNOSIS — G629 Polyneuropathy, unspecified: Secondary | ICD-10-CM | POA: Diagnosis not present

## 2015-12-06 DIAGNOSIS — K219 Gastro-esophageal reflux disease without esophagitis: Secondary | ICD-10-CM | POA: Diagnosis not present

## 2015-12-06 DIAGNOSIS — I10 Essential (primary) hypertension: Secondary | ICD-10-CM | POA: Diagnosis not present

## 2015-12-06 DIAGNOSIS — F1420 Cocaine dependence, uncomplicated: Secondary | ICD-10-CM | POA: Diagnosis not present

## 2015-12-06 DIAGNOSIS — F17210 Nicotine dependence, cigarettes, uncomplicated: Secondary | ICD-10-CM | POA: Diagnosis not present

## 2015-12-07 LAB — PRESCRIPTION ABUSE MONITORING 17P, URINE
6-ACETYLMORPHINE, URINE: NEGATIVE ng/mL
AMPHETAMINE SCREEN URINE: NEGATIVE ng/mL
BENZODIAZEPINE SCREEN, URINE: NEGATIVE ng/mL
Buprenorphine, Urine: NEGATIVE ng/mL
CANNABINOIDS UR QL SCN: NEGATIVE ng/mL
CARISOPRODOL/MEPROBAMATE, UR: NEGATIVE ng/mL
COCAINE(METAB.)SCREEN, URINE: NEGATIVE ng/mL
CREATININE(CRT), U: 71.9 mg/dL (ref 20.0–300.0)
EDDP, URINE: NEGATIVE ng/mL
Fentanyl, Urine: NEGATIVE pg/mL
MDMA Screen, Urine: NEGATIVE ng/mL
MEPERIDINE SCREEN, URINE: NEGATIVE ng/mL
METHADONE SCREEN, URINE: NEGATIVE ng/mL
NITRITE URINE, QUANTITATIVE: NEGATIVE ug/mL
OXYCODONE+OXYMORPHONE UR QL SCN: NEGATIVE ng/mL
Opiate Scrn, Ur: NEGATIVE ng/mL
PHENCYCLIDINE QUANTITATIVE URINE: NEGATIVE ng/mL
PROPOXYPHENE SCREEN URINE: NEGATIVE ng/mL
Ph of Urine: 5.5 (ref 4.5–8.9)
SPECIFIC GRAVITY: 1.016
Tapentadol, Urine: NEGATIVE ng/mL
Tramadol Screen, Urine: NEGATIVE ng/mL

## 2015-12-07 LAB — BARBITURATE (GC/MS), URINE
AMOBARBITAL: NEGATIVE
BARBITURATES: POSITIVE — AB
BUTALBITAL: NEGATIVE
PENTOBARBITAL: NEGATIVE
PHENOBARBITAL GC/MS, URINE: 395 ng/mL
PHENOBARBITAL: POSITIVE — AB
SECOBARBITAL: NEGATIVE

## 2015-12-07 LAB — ETHYL GLUCURONIDE, URINE: ETHYL GLUCURONIDE SCREEN, UR: NEGATIVE ng/mL

## 2015-12-10 ENCOUNTER — Other Ambulatory Visit (HOSPITAL_COMMUNITY): Payer: Self-pay | Admitting: Medical

## 2015-12-10 ENCOUNTER — Other Ambulatory Visit (HOSPITAL_COMMUNITY): Payer: Commercial Managed Care - HMO | Attending: Psychiatry | Admitting: Psychology

## 2015-12-10 DIAGNOSIS — G621 Alcoholic polyneuropathy: Secondary | ICD-10-CM

## 2015-12-10 DIAGNOSIS — F1020 Alcohol dependence, uncomplicated: Secondary | ICD-10-CM | POA: Diagnosis not present

## 2015-12-10 DIAGNOSIS — F1420 Cocaine dependence, uncomplicated: Secondary | ICD-10-CM

## 2015-12-10 DIAGNOSIS — G5790 Unspecified mononeuropathy of unspecified lower limb: Secondary | ICD-10-CM

## 2015-12-10 MED ORDER — PREGABALIN 150 MG PO CAPS: 150.0000 mg | ORAL_CAPSULE | Freq: Two times a day (BID) | ORAL | Status: DC

## 2015-12-12 ENCOUNTER — Encounter (HOSPITAL_COMMUNITY): Payer: Self-pay | Admitting: Psychology

## 2015-12-12 ENCOUNTER — Encounter (HOSPITAL_COMMUNITY): Payer: Self-pay

## 2015-12-12 NOTE — Progress Notes (Signed)
Daily Group Progress Note  Program: CD-IOP   Group Time: 1-2:30  Participation Level: Active  Behavioral Response: Appropriate and Sharing  Type of Therapy: Process Group  Topic: Counselors met with patients for group process session. Patients discussed their recovery from mind-altering drugs and alcohol. Patients were active and engaged. Counselor used open questions and Motivational Interviewing to help patients develop a stronger sense of motivation. Patients responded well to interventions and encouraged each other in recovery. Patients discussed Fourth of July plans.      Group Time: 2:45-4  Participation Level: Active  Behavioral Response: Appropriate and Sharing  Type of Therapy: Psycho-education Group  Topic: Counselors met with patients for group psychoeducation session. The topic included emotions and vulnerability. Counselor showed a TED talk by Almond Lint, Social Worker and educator who speaks on the power of vulnerability. Patients shared their interpretations of the video and discussed the difference between vulnerability and weakness.    Summary: Sobriety date remains 6/7. Patient presented for group today and was active and engaged in session. He shared that he was able to attend 2 AA meetings over the weekend. Patient was happy to report that he did not use his 1st of the month check on alcohol or drugs which is a huge trigger for him. The group congratulated him. He shared that he is feeling pain and tiredness since he "is doing too much" and recovery is physically taxing on his body. Patient shared that he is feeling hopeful for the future since he has not had this level of sobriety in many years and he is learning new strategies to help him cope with his cravings and triggers. Patient is doing well in his early recovery but he is not attending the required number of AA meetings due to his disability, pain, and inability to have convenient transportation.  Patient should explore alternative ways to attend meetings such as on-line, or getting rides from friends and family so he does not have to take the bus. Patient is agreeable and open in session and seems to be forming good connections with other group members. Patient should be encouraged to attend as many AA meetings as possible and begin to be more transparent with other people in recovery so he can build social support. Youlanda Roys, Counselor   Family Program: Family present? No   Name of family member(s):   UDS collected: No Results: negative  AA/NA attended?: YesFriday and Saturday  Sponsor?: No   Jo Booze, LCAS

## 2015-12-12 NOTE — Progress Notes (Signed)
    Daily Group Progress Note  Program: CD-IOP   Group Time: 1-2:30  Participation Level: Active  Behavioral Response: Appropriate and Sharing  Type of Therapy: Process Group  Topic: Clinician facilitated check-in regarding current stressors and situation, and progress on recovery. Clinician utilized active listening, validation, and empathetic response.  Clinician introduced topic of communication, specifically the types of communication and refusal skills,  and led discussion and activity based on this topic. Clinician assessed for immediate needs and safety concerns.       Group Time: 2:45-4  Participation Level: Active  Behavioral Response: Appropriate and Sharing  Type of Therapy: Psycho-education Group  Topic: Clinician facilitated check-in regarding current stressors and situation, and progress on recovery. Clinician utilized active listening, validation, and empathetic response.  Clinician introduced topic of communication, specifically the types of communication and refusal skills,  and led discussion and activity based on this topic. Clinician assessed for immediate needs and safety concerns.     Summary: Patient presents as positive and minimally engaged. Patient reports understanding of topic and related it to personal experience, recognizing ways to improve. Patient denies SI/HI/self harm thoughts and immediate needs. Sobriety date remains 6/7.   Family Program: Family present? No   Name of family member(s):   UDS collected: No Results: negative  AA/NA attended?: [place]  Sponsor?: No   Raidyn Breiner, LCAS

## 2015-12-12 NOTE — Progress Notes (Signed)
Daily Group Progress Note  Program: CD-IOP   Group Time: 1-2:30  Participation Level: Active  Behavioral Response: Appropriate and Sharing  Type of Therapy: Process Group  Topic: Counselors met with patients for group process session. Patients discussed their recovery from mind-altering drugs and alcohol. Patients were active and engaged. Counselor used open questions and Motivational Interviewing to help patients develop a stronger sense of motivation. Patients responded well to interventions and encouraged each other in recovery.     Group Time: 2:45-4  Participation Level: Active  Behavioral Response: Appropriate and Sharing  Type of Therapy: Psycho-education Group  Topic: Counselors met with patients for group psychoeducation session. The topic included emotions and feelings. Counselor led group game designed to elicit group interaction and discussion of feelings. Patients reported that they enjoyed the game and felt that it led to discussion on anger, resentment, bitterness, joy, and peace. Patients were active and engaged in psychoeducation session.    Summary: Sobriety date remains 6/7. Patient presented for group counseling today and was active and engaged in session. He shared that he was not able to attend any 12 step meetings but was planning on attending 3 meetings this weekend, since the time is more convenient for him. Patient shared that he is desiring a temporary sponsor who can help him work the steps. Patient shared details about his history of drug use and about his residence being a "house where people come to get high". Patient reported that many of his neighbors and relationships expect him to be able to provide drugs and alcohol and are surprised that he is now sober. Counselor suggested that he move and patient replied that he will never be able to escape the triggers of [place] since he will not leave the city since his mother lives here. Patient seems  to be doing well with his sobriety and has had all negative drug tests. Patient shared that he had thoughts and cravings to drink beer but he was able to distract himself by helping his mom and going outside of his house. Youlanda Roys, Counselor.    Family Program: Family present? No   Name of family member(s):   UDS collected: No Results: negative  AA/NA attended?: No  Sponsor?: No   Kem Hensen, LCAS

## 2015-12-13 ENCOUNTER — Encounter (HOSPITAL_COMMUNITY): Payer: Self-pay

## 2015-12-17 ENCOUNTER — Other Ambulatory Visit (HOSPITAL_COMMUNITY): Payer: Commercial Managed Care - HMO | Admitting: Psychology

## 2015-12-17 ENCOUNTER — Other Ambulatory Visit (HOSPITAL_COMMUNITY): Payer: Self-pay | Admitting: Medical

## 2015-12-17 DIAGNOSIS — F1420 Cocaine dependence, uncomplicated: Secondary | ICD-10-CM

## 2015-12-17 DIAGNOSIS — F1020 Alcohol dependence, uncomplicated: Secondary | ICD-10-CM | POA: Diagnosis not present

## 2015-12-18 ENCOUNTER — Encounter (HOSPITAL_COMMUNITY): Payer: Self-pay | Admitting: Psychology

## 2015-12-18 NOTE — Progress Notes (Signed)
    Daily Group Progress Note  Program: CD-IOP   Group Time: 1-2:30  Participation Level: Minimal  Behavioral Response: Appropriate, Sharing and Evasive  Type of Therapy: Process Group  Topic: Counselors met with patients for group processing session which focused on patient experience in recovery. Patients were active and engaged in session. Drug tests were collected from multiple patients. Patients were active and engaged in session, discussing their challenges, thoughts, and feelings concerning sobriety.     Group Time: 2:45-4  Participation Level: Minimal  Behavioral Response: Appropriate  Type of Therapy: Psycho-education Group  Topic: Counselors met with patients for group psychoeducation session which utilized a CBT intervention entitled "The ABCD" chart. Patients identified their negative behavioral and emotional consequences to their negative thoughts. Counselor encouraged patients to use real-life examples and dispute their irrational beliefs. Patients responded well to intervention, but it was clear that more work was needed to further the patients' understanding of the topic.   Summary: Patient presented to group counseling today and was active and mildly engaged in session. A drug test was collected.  Patient reported that he attended 2 AA meetings since our last group which does not meet the minimum requirement of 4 per week. Patient shared that he became frustrated over the weekend since he lost his cane and had to spend "unnecessary money". Patient reported that he received multiple calls late in the night which he assumed were for requests for drugs. He stated that he ignored the calls and did not answer them. Patient is making progress in his recovery but remains unable or unwilling to attend the required number of 12 Step meetings due to his lack of transportation and strong commitment to taking care of his mother. Counselor had encouraged pt to check into [place]  benefits and possible help for his care. Patient reported that he talked to the [place] and they told him he is not "sick enough" to warrant in-home care, despite having significant difficulties with hand tremors that impair ADL's. Patient did not share significantly during psychoeducation time but seemed observant and interested.  Patient sobriety date 6/7. Youlanda Roys, Counselor   Family Program: Family present? No   Name of family member(s):   UDS collected: Yes Results: negative  AA/NA attended?: YesFriday and Saturday  Sponsor?: No   Mohab Ashby, LCAS

## 2015-12-19 ENCOUNTER — Other Ambulatory Visit (HOSPITAL_COMMUNITY): Payer: Commercial Managed Care - HMO | Admitting: Psychology

## 2015-12-19 DIAGNOSIS — F1020 Alcohol dependence, uncomplicated: Secondary | ICD-10-CM | POA: Diagnosis not present

## 2015-12-19 DIAGNOSIS — G252 Other specified forms of tremor: Secondary | ICD-10-CM

## 2015-12-19 DIAGNOSIS — F1420 Cocaine dependence, uncomplicated: Secondary | ICD-10-CM

## 2015-12-19 LAB — PRESCRIPTION ABUSE MONITORING 17P, URINE
6-ACETYLMORPHINE, URINE: NEGATIVE ng/mL
AMPHETAMINE SCREEN URINE: NEGATIVE ng/mL
BARBITURATE SCREEN URINE: NEGATIVE ng/mL
BENZODIAZEPINE SCREEN, URINE: NEGATIVE ng/mL
BUPRENORPHINE, URINE: NEGATIVE ng/mL
CANNABINOIDS UR QL SCN: NEGATIVE ng/mL
CARISOPRODOL/MEPROBAMATE, UR: NEGATIVE ng/mL
COCAINE(METAB.)SCREEN, URINE: NEGATIVE ng/mL
CREATININE(CRT), U: 80.1 mg/dL (ref 20.0–300.0)
EDDP, Urine: NEGATIVE ng/mL
Fentanyl, Urine: NEGATIVE pg/mL
MDMA SCREEN, URINE: NEGATIVE ng/mL
Meperidine Screen, Urine: NEGATIVE ng/mL
Methadone Screen, Urine: NEGATIVE ng/mL
Nitrite Urine, Quantitative: NEGATIVE ug/mL
OXYCODONE+OXYMORPHONE UR QL SCN: NEGATIVE ng/mL
Opiate Scrn, Ur: NEGATIVE ng/mL
PHENCYCLIDINE QUANTITATIVE URINE: NEGATIVE ng/mL
Ph of Urine: 5.5 (ref 4.5–8.9)
Propoxyphene Scrn, Ur: NEGATIVE ng/mL
SPECIFIC GRAVITY: 1.015
TRAMADOL SCREEN, URINE: NEGATIVE ng/mL
Tapentadol, Urine: NEGATIVE ng/mL

## 2015-12-19 LAB — ETHYL GLUCURONIDE, URINE: Ethyl Glucuronide Screen, Ur: NEGATIVE ng/mL

## 2015-12-20 ENCOUNTER — Other Ambulatory Visit (HOSPITAL_COMMUNITY): Payer: Commercial Managed Care - HMO | Admitting: Psychology

## 2015-12-20 ENCOUNTER — Encounter (HOSPITAL_COMMUNITY): Payer: Self-pay | Admitting: Psychology

## 2015-12-20 DIAGNOSIS — F1020 Alcohol dependence, uncomplicated: Secondary | ICD-10-CM | POA: Diagnosis not present

## 2015-12-20 DIAGNOSIS — F1420 Cocaine dependence, uncomplicated: Secondary | ICD-10-CM

## 2015-12-20 NOTE — Progress Notes (Signed)
    Daily Group Progress Note  Program: CD-IOP   Group Time: 1-2:30  Participation Level: Active  Behavioral Response: Appropriate and Sharing  Type of Therapy: Process Group  Topic: Counselors met with patients for group process session. Patients shared about their recovery from drugs and alcohol. Patients were active and engaged in group. 2 Patients graduated from group successfully with some family members and AA sponsors in attendance. One group member shared that he had not gone to any meetings in over a week and some group members confronted him about this issue.     Group Time: 2:45-4  Participation Level: Active  Behavioral Response: Appropriate and Sharing  Type of Therapy: Psycho-education Group  Topic: Counselors met with patients for group psychoeducation session. The topic was mindfulness in recovery with an emphasis on developing a non-judgmental stance and ability to be more present with their emotional experience. Counselor used a handout from the DBT skills workbook on developing "wise mind" in recovery. Patients were thoughtfully engaged and showed interest in the subject. Some patients felt overwhelmed by the "depth" of the subject but counselor encouraged them to practice mindfulness in simple terms.    Summary: Patient was mildly engaged in group today and presented with slightly low energy and feeling "a little sick". Patient shared that he almost stayed home but he did not want to miss graduation. Patient expressed frustration at himself since he felt like he pedaled too hard on his bike last night and he is feeling bad today. Patient reports that his sleep is good and that he is getting better at slowing down and learning to "act his age". Patient expressed frustration at himself when doing a mindful coloring exercise since he has tremors and judged himself for not "coloring well". Counselor hopes to work with patient on his depression and low energy. Patient  sobriety date is 6/7. Youlanda Roys, Counselor   Family Program: Family present? No   Name of family member(s):   UDS collected: No Results: negative  AA/NA attended?: No  Sponsor?: No   Kerryn Tennant, LCAS

## 2015-12-24 ENCOUNTER — Other Ambulatory Visit (HOSPITAL_COMMUNITY): Payer: Self-pay | Admitting: Medical

## 2015-12-24 ENCOUNTER — Other Ambulatory Visit (HOSPITAL_COMMUNITY): Payer: Commercial Managed Care - HMO | Admitting: Psychology

## 2015-12-24 DIAGNOSIS — F1420 Cocaine dependence, uncomplicated: Secondary | ICD-10-CM

## 2015-12-24 DIAGNOSIS — F1020 Alcohol dependence, uncomplicated: Secondary | ICD-10-CM

## 2015-12-25 ENCOUNTER — Encounter (HOSPITAL_COMMUNITY): Payer: Self-pay | Admitting: Psychology

## 2015-12-25 NOTE — Progress Notes (Signed)
    Daily Group Progress Note  Program: CD-IOP   Group Time: 1-2:30 pm  Participation Level: Active  Behavioral Response: Sharing  Type of Therapy: Process Group  Topic: Process:  The first half of group was spent in process. Members shared about any obstacles or challenges in early recovery.  One member checked-in with a new sobriety date of today. His relapse was analyzed at length.  A new group member was present and during this part of group, she shared about herself and what she needed from this program and the group. The program director met with 2 group members today. Drug tests were collected from 2 group members.  Group Time: 2:45- 4pm  Participation Level: Active  Behavioral Response: Appropriate  Type of Therapy: Psycho-education Group  Topic: Psycho-Ed: The second half of group was spent in a psycho-ed. the session was the conclusion of the previous group session and the topic was CBT or, specifically, the ABC(D) technique of identifying and challenging negative self-talk and beliefs. The handout provided in Monday's group session was reviewed and specific examples that group members identified were analyzed on the board. This psycho-ed included challenging the old inaccurate beliefs and replacing them with objective and accurate beliefs. One member explained that he replaces the "B with the D". The importance of challenging our thoughts and beliefs was reiterated throughout this session.    Summary: Sobriety Date = 6/7. The patient reported he had attended one AA meeting at the [employer], "[facility] [employer] [facility]". He saw some people at the meeting that he hadn't seen in years and noted how some people are really doing well and others only act like they are doing well. He reported he had spoken after the meeting to one fellow he has known for years. This man presented himself as clean and sober, but admitted to the patient that he drinks an occasional beer. The patient pointed out,  'he is only fooling himself' and that there is no way an addict can have a beer every so often and not get into trouble. "As least, I couldn't" the patient reported. The patient also reported he had gone to sue the bathroom at the [employer] and was surprised to find a drug deal going down in the men's room. He reported that the drug dealer had used the bathroom and then when he came out of the stall, "he didn't even wash his hands". Everyone laughed at this disclosure. During the psycho-ed, the patient was asked about any beliefs he has that cause him problems? The patient admitted that despite his disability, he finds it 'hard to ask for help'. He admitted that his father had instilled his belief in him. The counselor pointed out that he has led a fairly isolated life and kept by himself for much of his time. Part of his healing will include reaching out, asking for help and learning to accept it when given. The patient is sober and he is attending 12-step meetings, but he does not have a sponsor and still struggles getting to meetings and does not have a sponsor. The patient provided good feedback to his fellow group members and he responded well to this intervention.   Family Program: Family present? No   Name of family member(s):   UDS collected: No Results:   AA/NA attended?: [place]  Sponsor?: No   Lailynn Southgate, LCAS

## 2015-12-25 NOTE — Progress Notes (Signed)
    Daily Group Progress Note  Program: CD-IOP   Group Time: 1-2:30  Participation Level: Active  Behavioral Response: Appropriate and Sharing  Type of Therapy: Process Group  Topic: Patients presented for group process session. Topics included patients' sobriety, recovery efforts, and any challenges they faced over the weekend. Counselors used open questions and MI to evoke "change talk", grow motivation, and help patients explore their affective experience. Drugs tests were collected from some individuals. One patient met with Darlyne Russian, PA, program director to establish care.     Group Time: 2:45-4  Participation Level: Active  Behavioral Response: Appropriate and Sharing  Type of Therapy: Psycho-education Group  Topic: Patients presented for group psychoeducation session. Topics included "body, mind, spirit connection" with an emphasis on spirituality in recovery. Patients explored their spiritual connection including soul, religious experience, and purpose with a recovery-based discussion. Patients were active and engaged.   Summary: Patient presented for group counseling today and was active and engaged in session. He reported that he was feeling tired and drained due to "doing too much over the weekend." Patient reported that he attended 3 AA/NA meetings over the weekend. He feels that the heat of summer is "zapping his energy". He told the group he got his 30 day chip. He said that he knows when he feels tired he also worries about feeling complacent which is why he knows he needs group. Patient seems to be making good progress in his recovery but still needs support around finding a balance that is sustainable for his age and condition. Patient shared that he has religious/spiritual tapes that he listens to which inspire him. Patient sobriety date remains 6/7. Youlanda Roys, Counselor   Family Program: Family present? No   Name of family member(s):   UDS collected: Yes  Results: negative  AA/NA attended?: YesFriday and Saturday  Sponsor?: No   Yuniel Blaney, LCAS

## 2015-12-26 ENCOUNTER — Other Ambulatory Visit (HOSPITAL_COMMUNITY): Payer: Commercial Managed Care - HMO | Admitting: Psychology

## 2015-12-26 DIAGNOSIS — F1420 Cocaine dependence, uncomplicated: Secondary | ICD-10-CM

## 2015-12-26 DIAGNOSIS — F1020 Alcohol dependence, uncomplicated: Secondary | ICD-10-CM | POA: Diagnosis not present

## 2015-12-26 DIAGNOSIS — G252 Other specified forms of tremor: Secondary | ICD-10-CM

## 2015-12-26 LAB — PRESCRIPTION ABUSE MONITORING 17P, URINE
6-Acetylmorphine, Urine: NEGATIVE ng/mL
Amphetamine Scrn, Ur: NEGATIVE ng/mL
BARBITURATE SCREEN URINE: NEGATIVE ng/mL
BENZODIAZEPINE SCREEN, URINE: NEGATIVE ng/mL
BUPRENORPHINE, URINE: NEGATIVE ng/mL
CANNABINOIDS UR QL SCN: NEGATIVE ng/mL
COCAINE(METAB.)SCREEN, URINE: NEGATIVE ng/mL
CREATININE(CRT), U: 81.2 mg/dL (ref 20.0–300.0)
Carisoprodol/Meprobamate, Ur: NEGATIVE ng/mL
EDDP, Urine: NEGATIVE ng/mL
Fentanyl, Urine: NEGATIVE pg/mL
MDMA Screen, Urine: NEGATIVE ng/mL
MEPERIDINE SCREEN, URINE: NEGATIVE ng/mL
METHADONE SCREEN, URINE: NEGATIVE ng/mL
Nitrite Urine, Quantitative: NEGATIVE ug/mL
OPIATE SCREEN URINE: NEGATIVE ng/mL
OXYCODONE+OXYMORPHONE UR QL SCN: NEGATIVE ng/mL
PHENCYCLIDINE QUANTITATIVE URINE: NEGATIVE ng/mL
PROPOXYPHENE SCREEN URINE: NEGATIVE ng/mL
Ph of Urine: 5.5 (ref 4.5–8.9)
SPECIFIC GRAVITY: 1.016
TAPENTADOL, URINE: NEGATIVE ng/mL
Tramadol Screen, Urine: NEGATIVE ng/mL

## 2015-12-26 LAB — ETHYL GLUCURONIDE, URINE: ETHYL GLUCURONIDE SCREEN, UR: NEGATIVE ng/mL

## 2015-12-27 ENCOUNTER — Encounter (HOSPITAL_COMMUNITY): Payer: Self-pay | Admitting: Psychology

## 2015-12-27 NOTE — Progress Notes (Signed)
    Daily Group Progress Note  Program: CD-IOP   Group Time: 1-2:30 pm  Participation Level: Active  Behavioral Response: Appropriate and Sharing  Type of Therapy: Process Group  Topic: Process: the first half of group was spent in process. Members shared about challenges and successes in early recovery. Included in their check-in, are the things that they did since we last met that enhance or support their sobriety. The program director met with the 2 newest members. Two random drug tests were collected today while the 5 collected on Monday were returned.  Group Time: 2:45- 4pm  Participation Level: Active  Behavioral Response: Sharing  Type of Therapy: Psycho-education Group  Topic: Psycho-ed: Mind, Body, Spirit, Part 2. The second half of group was spent in a psycho-ed. The session included members completing the handout provided on Monday. The discussion that followed focused on identifying how 'feed' their mind. The session focused mostly in values and the question was 'how do you value yourself'? this proved challenging to a group of caretakers who have difficulty sitting still, let alone considering their worth outside of what they do. The session proved challenging with group members agreeing that this issue will have to be addressed in their recovery.    Summary: Sobriety date = 6/7. The patient reported he had spent yesterday at home. He noted the heat is hard on him and he takes the bus everywhere and he was exhausted. The patient reported he did sit on the porch at one point and some friends went by who are in recovery. The patient reported he intends to purchase a scooter in early August with savings and this will free him up somewhat and allow him to make more meetings. During the psycho-ed, the patient agreed with another group member about 'wearing a mask'. He explained that he doesn't share or disclose what he is really feeling or thinking. He can't trust people with his  real feelings. When asked what he does relate to on the list for Mind, the patient identified #3. He reported that he tries to keep a positive mindset about his life. The patient continues to make substantial progress relative to his sobriety and clearly benefits from the social interaction and support he receives from his fellow group members. He has repeatedly reported how much he enjoys being here in this group and despite the difficulty getting here each day, he feels it is worth it. However, he lives by himself and struggles with some significant health issues. We have encouraged him to reach out to other men in recovery and establish some stronger relationships and friendships outside of these walls.    Family Program: Family present? No   Name of family member(s):   UDS collected: No Results:   AA/NA attended?: No  Sponsor?: Yes, he has a temporary sponsor   Suetta Hoffmeister, LCAS

## 2015-12-30 NOTE — Progress Notes (Signed)
    Daily Group Progress Note  Program: CD-IOP   Group Time: 1-2:30 pm  Participation Level: Active  Behavioral Response: Sharing  Type of Therapy: Process Group  Topic: Process: the first half of group was spent in process. Members shared about challenges and successes in early recovery. Included in their check-in, are the things that they did since we last met that enhance or support their sobriety. The program director met with the 2 newest members. Two random drug tests were collected today while the 5 collected on Monday were returned.  Group Time: 2:45- 4pm  Participation Level: Active  Behavioral Response: Sharing  Type of Therapy: Psycho-education Group  Topic: Psycho-Ed: Mind, Body, Spirit, Part 2. The second half of group was spent in a psycho-ed. The session included members completing the handout provided on Monday. The discussion that followed focused on identifying how they 'feed' their mind. The session focused mostly in values and the question was 'how do you value yourself'? This proved challenging to a group of caretakers who have difficulty sitting still, let alone considering their worth outside of what they do. The session proved challenging with group members agreeing that this issue will have to be addressed in their recovery.    Summary: Sobriety date = 6/7. The patient reported he had spent yesterday at home. He noted the heat is hard on him and he takes the bus everywhere and he was exhausted. The patient reported he did sit on the porch at one point and some friends went by who are in recovery. The patient reported he intends to purchase a scooter in early August with savings and this will free him up somewhat and allow him to make more meetings. During the psycho-ed, the patient agreed with another group member about 'wearing a mask'. He explained that he doesn't share or disclose what he is really feeling or thinking. He can't trust people with his real  feelings. When asked what he does relate to on the list for Mind, the patient identified #3. He reported that he tries to keep a positive mindset about his life. The patient continues to make substantial progress relative to his sobriety and clearly benefits from the social engagement and support he receives from his fellow group members. He has repeatedly stated how much he enjoys being here in this group and despite the difficulty getting here each day, he feels it is worth it. However, he lives by himself and struggles with some significant health issues. We have encouraged him to reach out to other men in recovery and establish some stronger relationships and friendships outside of these walls.    Family Program: Family present? No   Name of family member(s):   UDS collected: No Results:   AA/NA attended?: No  Sponsor?: No   Keilly Fatula, LCAS

## 2015-12-30 NOTE — Progress Notes (Signed)
    Daily Group Progress Note  Program: CD-IOP   Group Time: 1-2:30 pm  Participation Level: Active  Behavioral Response: Appropriate and Sharing  Type of Therapy: Process Group  Topic: Process: the first half of group was spent in process. Members shared about challenges and successes in early recovery. Included in their check-in, are the things that they did since we last met that enhance or support their sobriety. The program director met with the 2 newest members. Two random drug tests were collected today while the 5 collected on Monday were returned.  Group Time: 2:45-4 pm Participation Level: Active  Behavioral Response: Sharing  Type of Therapy: Psycho-education Group  Topic: Psycho-Ed: Mind, Body, Spirit, Part 2. The second half of group was spent in a psycho-ed. The session included members completing the handout provided on Monday. The discussion that followed focused on identifying how they 'feed' their mind. The session focused mostly in values and the question was 'how do you value yourself'? This proved challenging to a group of caretakers who have difficulty sitting still, let alone considering their worth outside of what they do. The session proved challenging with group members agreeing that this issue will have to be addressed in their recovery.    Summary: Sobriety date = 6/7. The patient reported he had spent yesterday at home. He noted the heat is hard on him and he takes the bus everywhere and he was exhausted. The patient reported he did sit on the porch at one point and some friends went by who are in recovery. The patient reported he intends to purchase a scooter in early August with savings and this will free him up somewhat and allow him to make more meetings. During the psycho-ed, the patient agreed with another group member about 'wearing a mask'. He explained that he doesn't share or disclose what he is really feeling or thinking. He can't trust people with  his real feelings. When asked what he does relate to on the list for Mind, the patient identified #3. He reported that he tries to keep a positive mindset about his life. The patient continues to make substantial progress relative to his sobriety and clearly benefits from the social engagement and support he receives from his fellow group members. He has repeatedly stated how much he enjoys being here in this group and despite the difficulty getting here each day, he feels it is worth it. However, he lives by himself and struggles with some significant health issues. We have encouraged him to reach out to other men in recovery and establish some stronger relationships and friendships outside of these walls.    Family Program: Family present? No   Name of family member(s):   UDS collected: No Results:  AA/NA attended?: No  Sponsor?: Yes   Samaria Anes, LCAS

## 2015-12-31 ENCOUNTER — Other Ambulatory Visit (HOSPITAL_COMMUNITY): Payer: Commercial Managed Care - HMO

## 2015-12-31 ENCOUNTER — Encounter (HOSPITAL_COMMUNITY): Payer: Self-pay | Admitting: Psychology

## 2016-01-01 NOTE — Progress Notes (Signed)
Thomas Mcneil is a 65 y.o. male patient. CD-IOP: Spoke with patient this morning. He stated that he was really tired and didn't feel as if he would be able to continue in the program. He reported he was going to buy a scooter the first of next month and he would be much more mobile and freer then. I suggested that I speak with the program director to determine a course of action. The patient has made good progress and remained drug free since entering the program. I will excuse him from group today.         Charmian Muff, LCAS

## 2016-01-02 ENCOUNTER — Other Ambulatory Visit (HOSPITAL_COMMUNITY): Payer: Commercial Managed Care - HMO

## 2016-01-03 ENCOUNTER — Other Ambulatory Visit (HOSPITAL_COMMUNITY): Payer: Commercial Managed Care - HMO

## 2016-01-14 ENCOUNTER — Other Ambulatory Visit (HOSPITAL_COMMUNITY): Payer: Commercial Managed Care - HMO

## 2016-01-16 ENCOUNTER — Other Ambulatory Visit (HOSPITAL_COMMUNITY): Payer: Commercial Managed Care - HMO

## 2016-01-17 ENCOUNTER — Other Ambulatory Visit (HOSPITAL_COMMUNITY): Payer: Commercial Managed Care - HMO

## 2016-01-28 ENCOUNTER — Other Ambulatory Visit (HOSPITAL_COMMUNITY): Payer: Commercial Managed Care - HMO

## 2016-01-30 ENCOUNTER — Other Ambulatory Visit (HOSPITAL_COMMUNITY): Payer: Commercial Managed Care - HMO

## 2016-01-31 ENCOUNTER — Other Ambulatory Visit (HOSPITAL_COMMUNITY): Payer: Commercial Managed Care - HMO

## 2016-02-13 ENCOUNTER — Other Ambulatory Visit (HOSPITAL_COMMUNITY): Payer: Commercial Managed Care - HMO

## 2016-02-13 DIAGNOSIS — R1111 Vomiting without nausea: Secondary | ICD-10-CM | POA: Diagnosis not present

## 2016-02-14 ENCOUNTER — Other Ambulatory Visit (HOSPITAL_COMMUNITY): Payer: Commercial Managed Care - HMO

## 2016-02-25 ENCOUNTER — Other Ambulatory Visit (HOSPITAL_COMMUNITY): Payer: Commercial Managed Care - HMO

## 2016-02-27 ENCOUNTER — Other Ambulatory Visit (HOSPITAL_COMMUNITY): Payer: Commercial Managed Care - HMO

## 2016-02-28 ENCOUNTER — Other Ambulatory Visit (HOSPITAL_COMMUNITY): Payer: Commercial Managed Care - HMO

## 2016-03-10 ENCOUNTER — Other Ambulatory Visit (HOSPITAL_COMMUNITY): Payer: Commercial Managed Care - HMO

## 2016-03-12 ENCOUNTER — Other Ambulatory Visit (HOSPITAL_COMMUNITY): Payer: Commercial Managed Care - HMO

## 2016-03-13 ENCOUNTER — Other Ambulatory Visit (HOSPITAL_COMMUNITY): Payer: Commercial Managed Care - HMO

## 2016-03-24 ENCOUNTER — Other Ambulatory Visit (HOSPITAL_COMMUNITY): Payer: Commercial Managed Care - HMO

## 2016-03-26 ENCOUNTER — Other Ambulatory Visit (HOSPITAL_COMMUNITY): Payer: Commercial Managed Care - HMO

## 2016-03-27 ENCOUNTER — Other Ambulatory Visit (HOSPITAL_COMMUNITY): Payer: Commercial Managed Care - HMO

## 2016-04-07 ENCOUNTER — Other Ambulatory Visit (HOSPITAL_COMMUNITY): Payer: Commercial Managed Care - HMO

## 2016-04-09 ENCOUNTER — Other Ambulatory Visit (HOSPITAL_COMMUNITY): Payer: Commercial Managed Care - HMO

## 2016-04-10 ENCOUNTER — Other Ambulatory Visit (HOSPITAL_COMMUNITY): Payer: Commercial Managed Care - HMO

## 2016-04-21 ENCOUNTER — Other Ambulatory Visit (HOSPITAL_COMMUNITY): Payer: Commercial Managed Care - HMO

## 2016-04-23 ENCOUNTER — Other Ambulatory Visit (HOSPITAL_COMMUNITY): Payer: Commercial Managed Care - HMO

## 2016-04-24 ENCOUNTER — Other Ambulatory Visit (HOSPITAL_COMMUNITY): Payer: Commercial Managed Care - HMO

## 2016-05-05 ENCOUNTER — Other Ambulatory Visit (HOSPITAL_COMMUNITY): Payer: Commercial Managed Care - HMO

## 2016-05-07 ENCOUNTER — Other Ambulatory Visit (HOSPITAL_COMMUNITY): Payer: Commercial Managed Care - HMO

## 2016-05-08 ENCOUNTER — Other Ambulatory Visit (HOSPITAL_COMMUNITY): Payer: Commercial Managed Care - HMO

## 2016-05-19 ENCOUNTER — Other Ambulatory Visit (HOSPITAL_COMMUNITY): Payer: Commercial Managed Care - HMO

## 2016-08-21 IMAGING — US US ABDOMEN COMPLETE W/ ELASTOGRAPHY
1 series · 13 of 21 positions shown · non-contrast
Comparison: 11/09/2008

CLINICAL DATA: Chronic hepatitis-C.  Without hepatic coma.



[Series 1: us abdomen complete w/ elastography · 0.14mm/px · 13 of 21 slices shown]
[im 1/21]
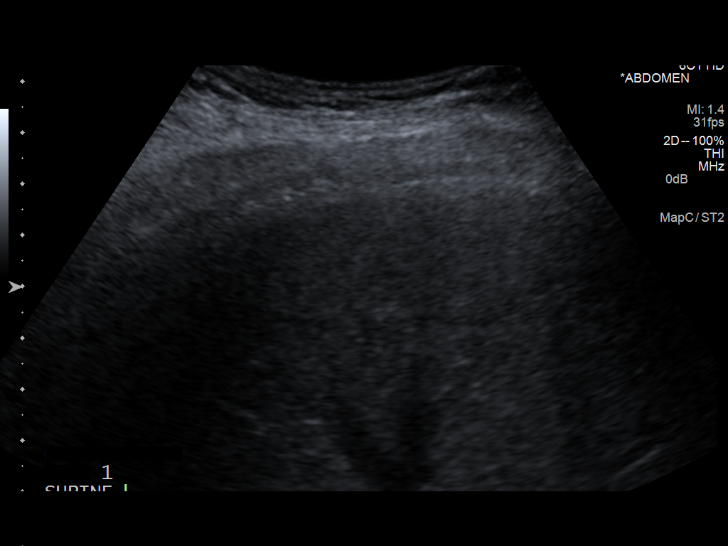
[im 3/21]
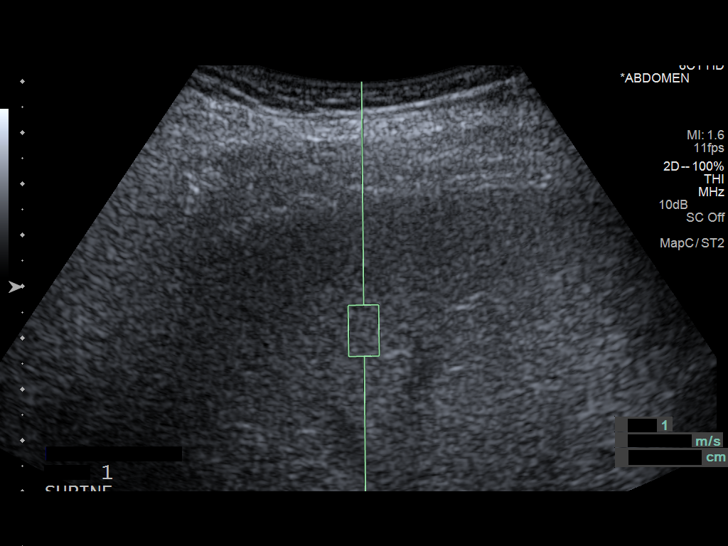
[im 5/21]
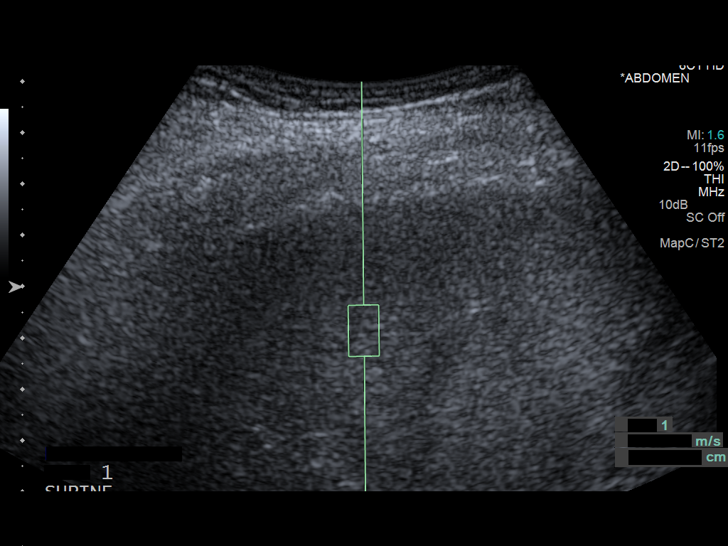
[im 6/21]
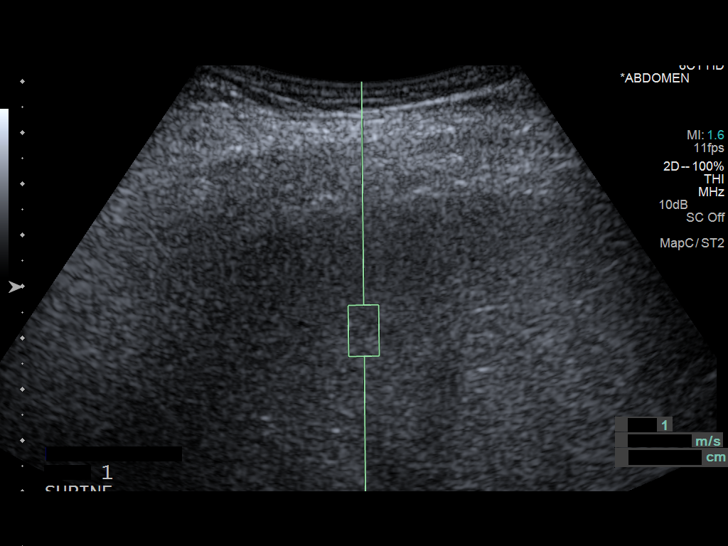
[im 8/21]
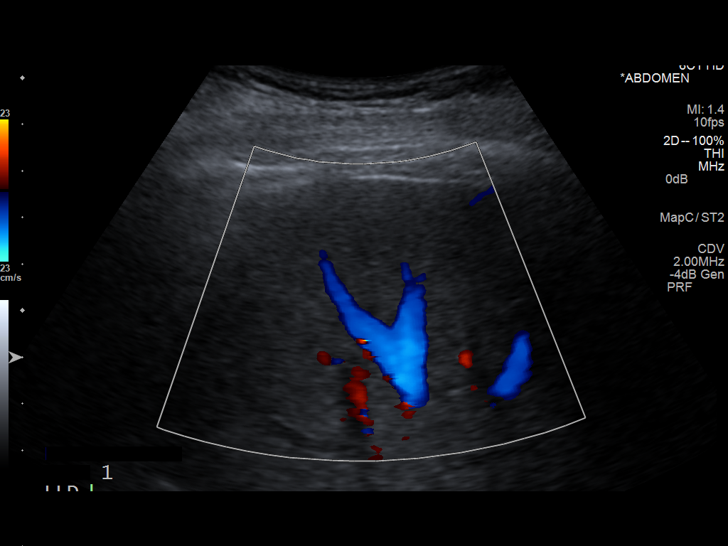
[im 9/21]
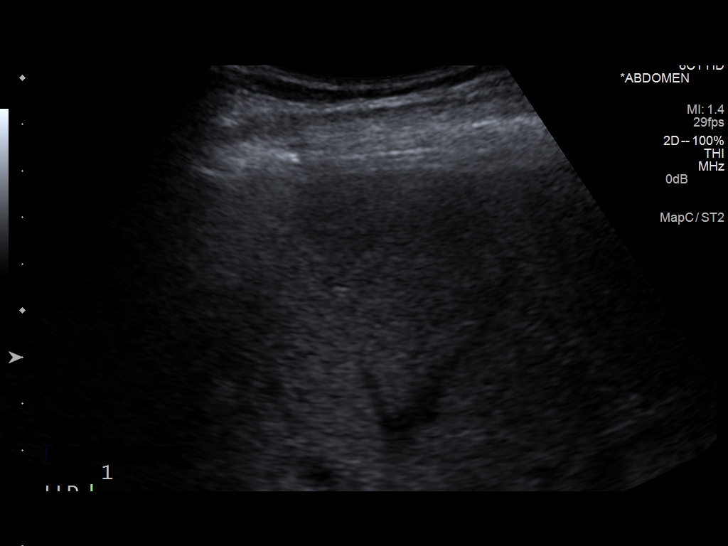
[im 11/21]
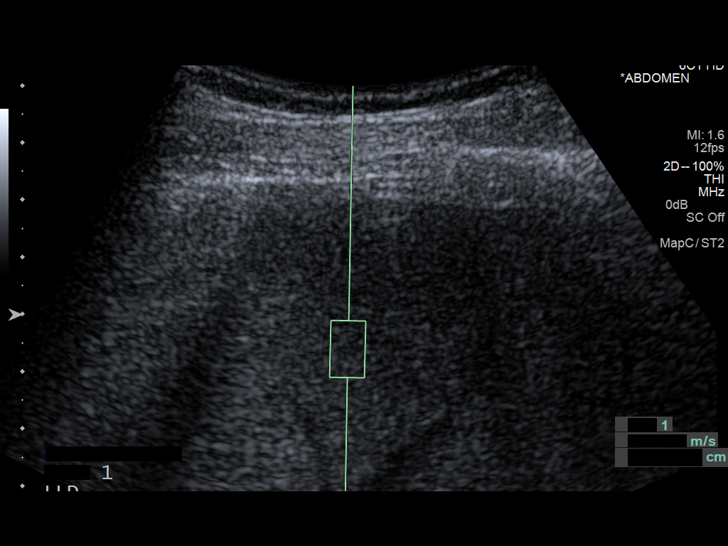
[im 13/21]
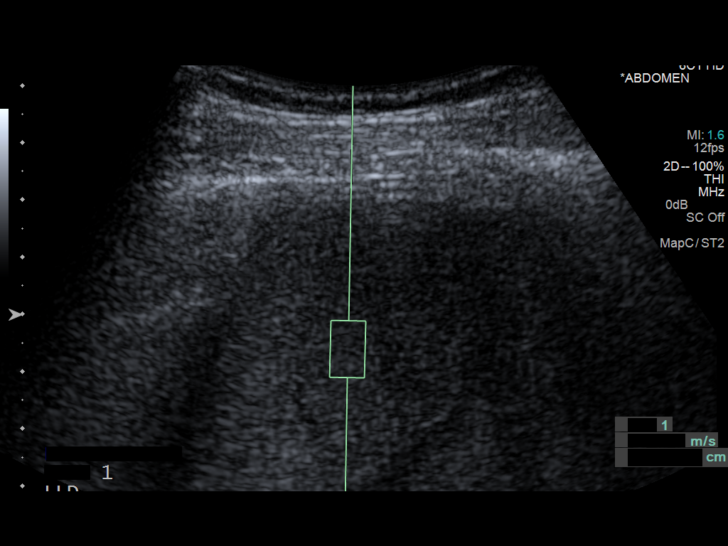
[im 14/21]
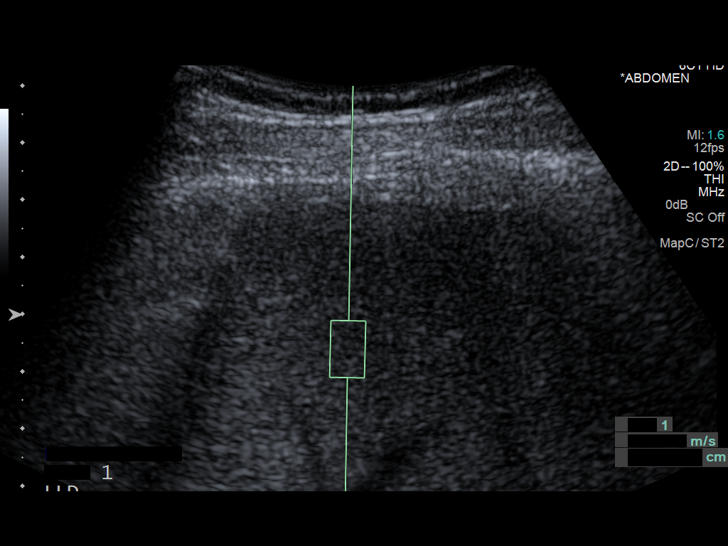
[im 16/21]
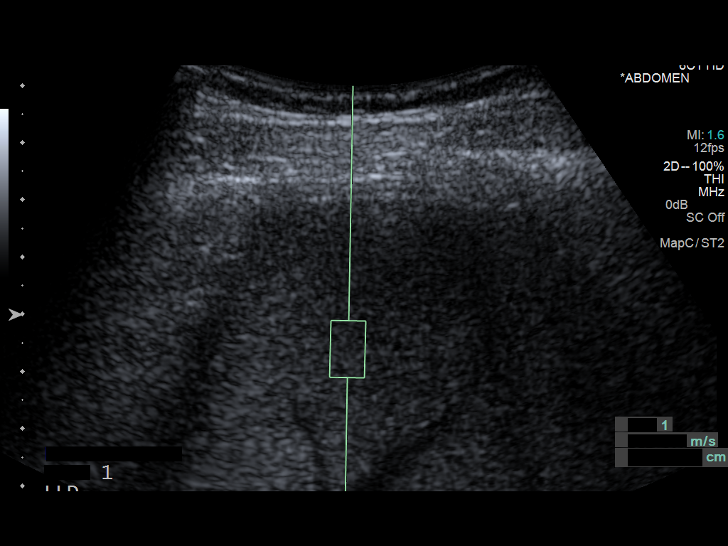
[im 17/21]
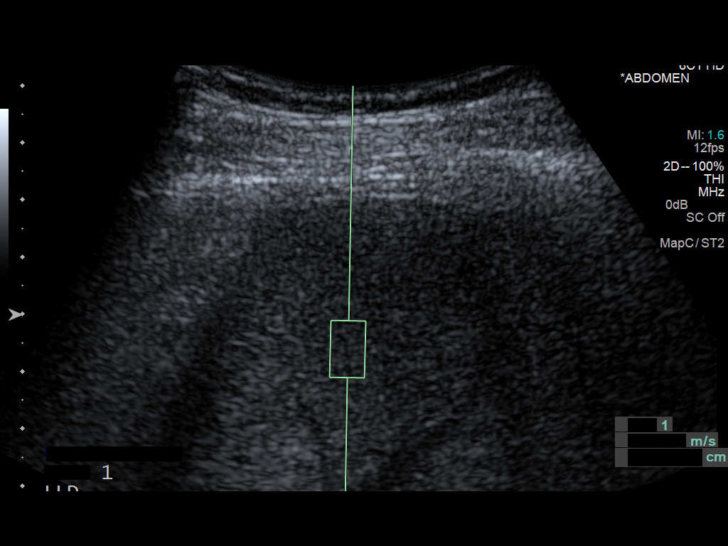
[im 19/21]
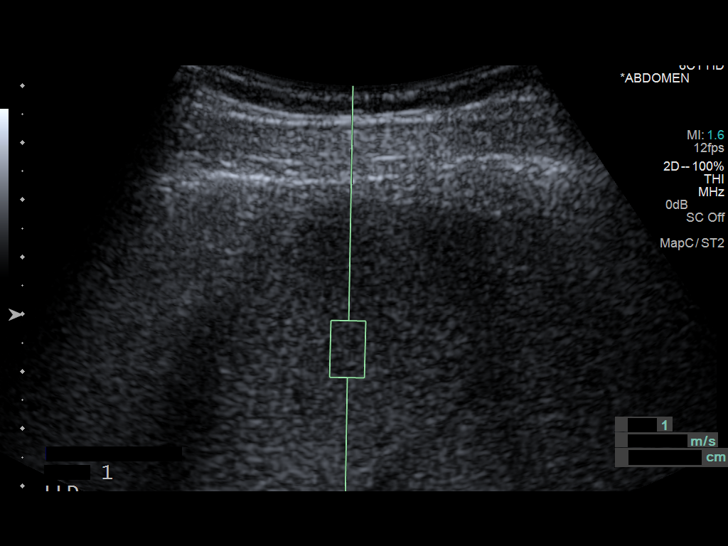
[im 21/21]
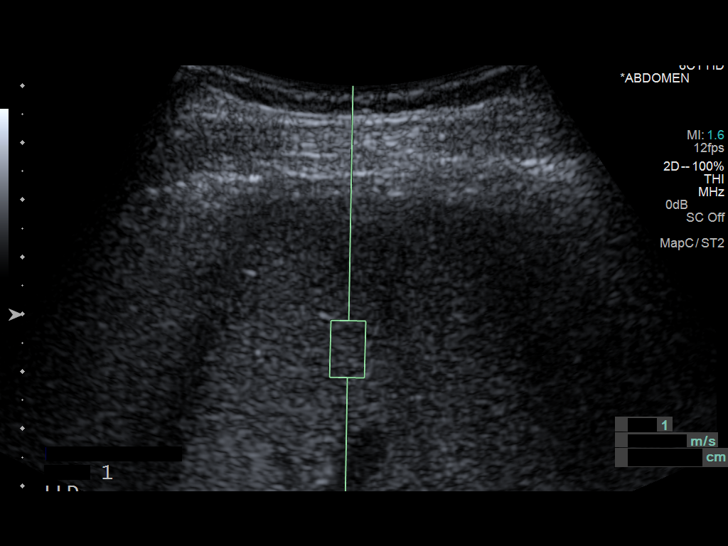

[13 of 21 positions shown; findings below may reference images not displayed]

FINDINGS: ULTRASOUND ABDOMEN

Gallbladder: Gallbladder has a normal appearance. Gallbladder wall
is 2.8 mm, within normal limits. No stones or pericholecystic fluid.
No sonographic Murphy's sign.

Common bile duct: Diameter: 3.4 mm

Liver: No focal lesion identified. Within normal limits in
parenchymal echogenicity.

IVC: No abnormality visualized.

Pancreas: Visualized portion unremarkable. Pancreatic tail is not
fully evaluated because of bowel gas.

Spleen: Visualized portion has normal size and appearance.

Right Kidney: Length: 10.3 cm. Echogenicity is normal. No
hydronephrosis. Small hypoechoic lesion is 0.7 x 0.8 x 0.7 cm in the
upper pole region. This lesion is too small to fully evaluate but
has features consistent with cyst.

Left Kidney: Length: 11.0 cm. Echogenicity within normal limits. No
mass or hydronephrosis visualized.

Abdominal aorta: 2.8 cm

Other findings: None.

ULTRASOUND HEPATIC ELASTOGRAPHY

Device: Siemens Helix VTQ

Transducer 6 C1 HD

Patient position: Left lateral decubitus

Number of measurements:  10

Hepatic Segment:  8

Median velocity:   1.35  m/sec

IQR:

IQR/Median velocity ratio

Corresponding Metavir fibrosis score:  F2 and some F3

Risk of fibrosis: Moderate

Limitations of exam: None

Pertinent findings noted on other imaging exams:  None

Please note that abnormal shear wave velocities may also be
identified in clinical settings other than with hepatic fibrosis,
such as: acute hepatitis, elevated right heart and central venous
pressures including use of beta blockers, Dedcj disease
(Tiger), infiltrative processes such as
mastocytosis/amyloidosis/infiltrative tumor, extrahepatic
cholestasis, in the post-prandial state, and liver transplantation.
Correlation with patient history, laboratory data, and clinical
condition recommended.
IMPRESSION: 1. Normal grayscale evaluation of the liver.
2. Small probable right renal cyst.  No hydronephrosis.
3. Median hepatic shear wave velocity is calculated at 1.35 m/sec.
4. Corresponding Metavir fibrosis score is F2 and some F3.
5. Risk of fibrosis is moderate.
6. Follow-up:  Additional testing as appropriate.

## 2016-08-21 IMAGING — US US ABDOMEN COMPLETE W/ ELASTOGRAPHY
1 series · 13 of 25 positions shown · non-contrast
Comparison: 11/09/2008

CLINICAL DATA: Chronic hepatitis-C.  Without hepatic coma.



[Series 1: us abdomen complete w/ elastography · 0.17mm/px · 13 of 127 slices shown]
[im 1/127]
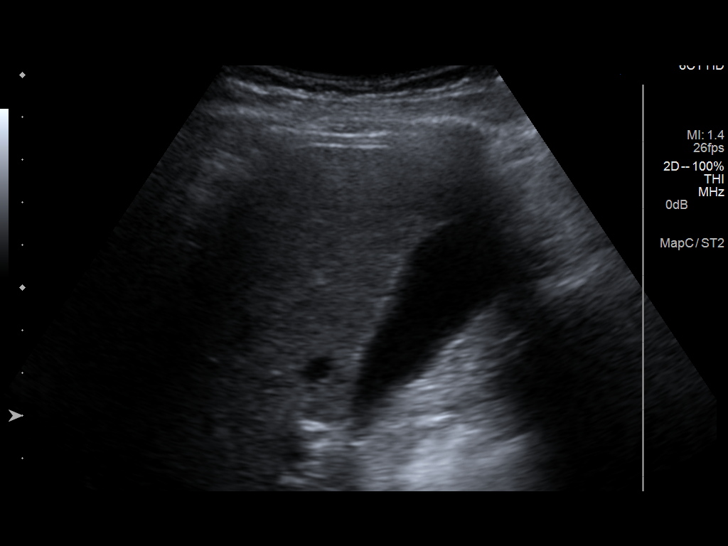
[im 11/127]
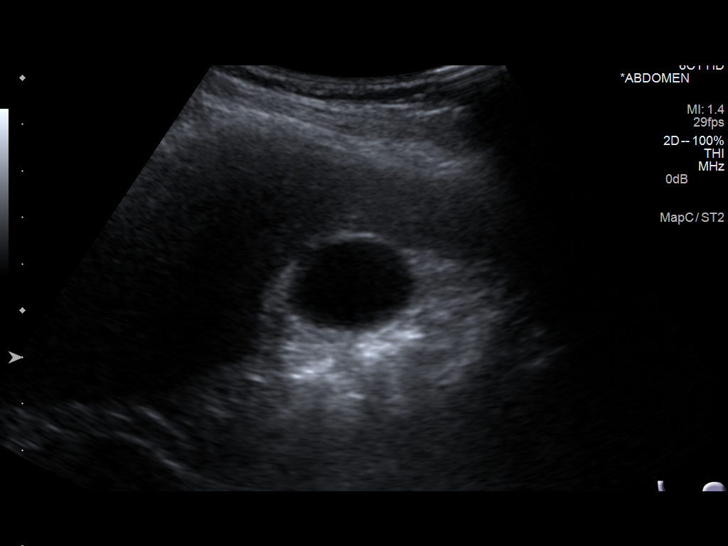
[im 22/127]
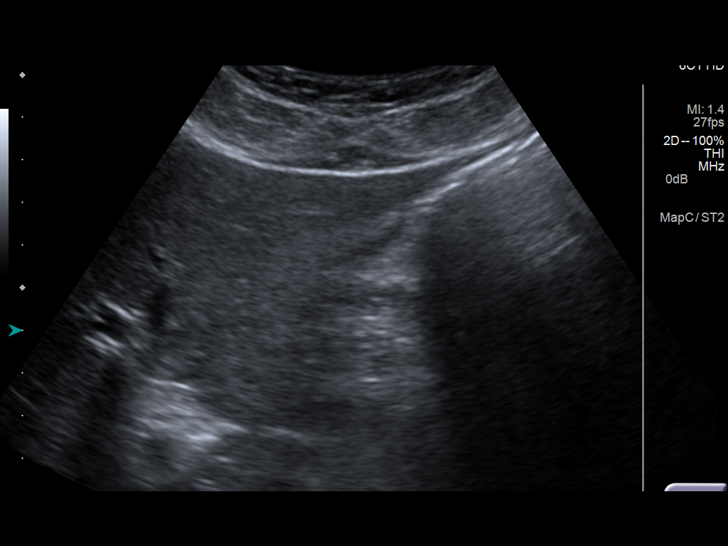
[im 32/127]
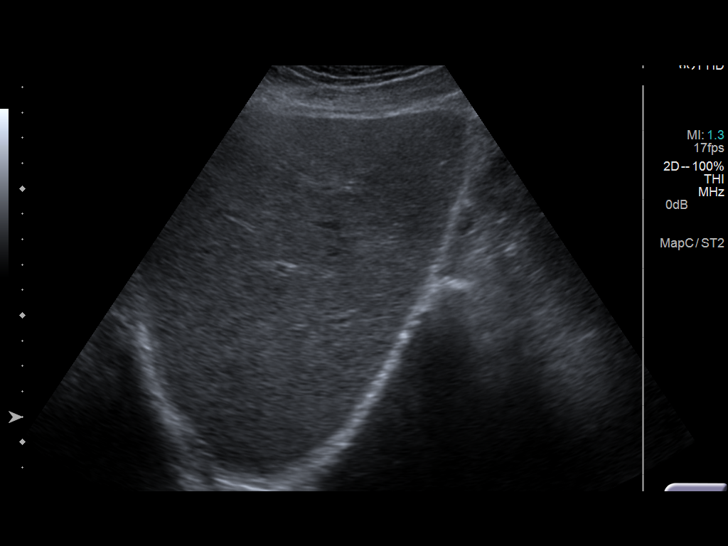
[im 43/127]
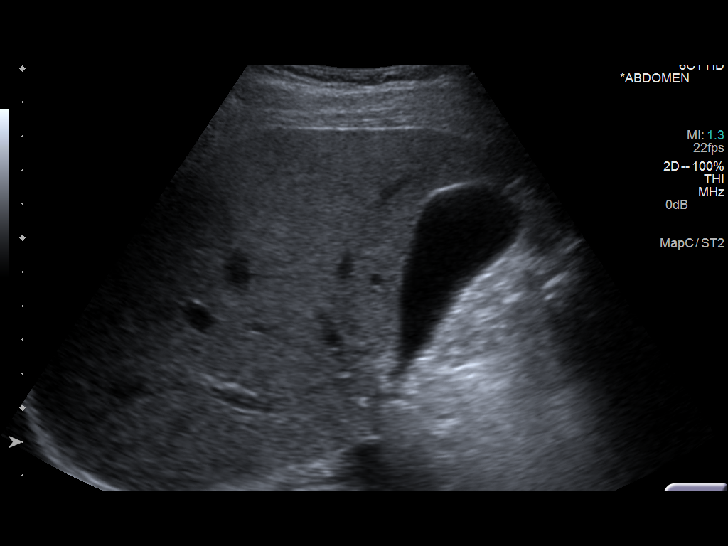
[im 53/127]
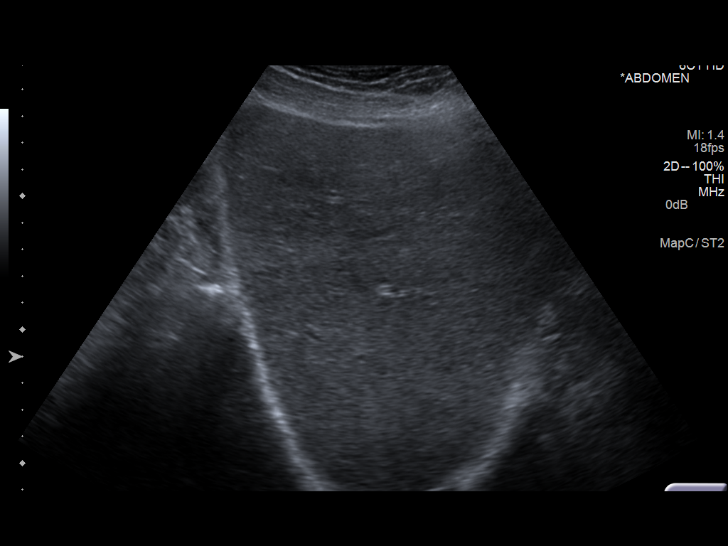
[im 64/127]
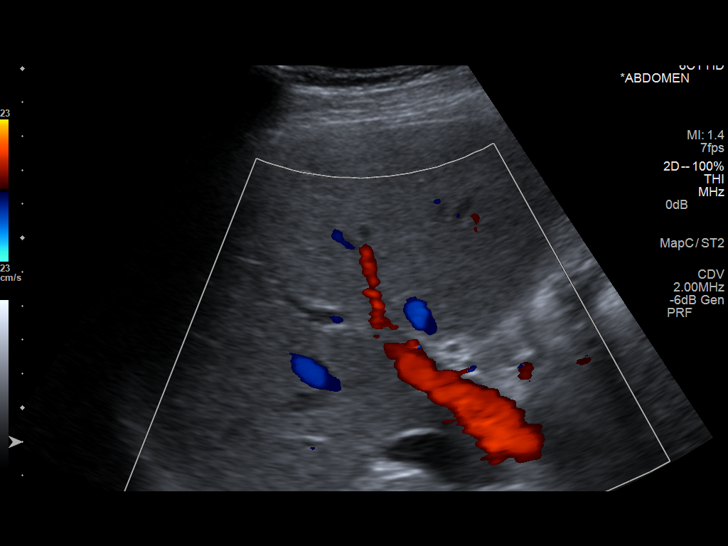
[im 74/127]
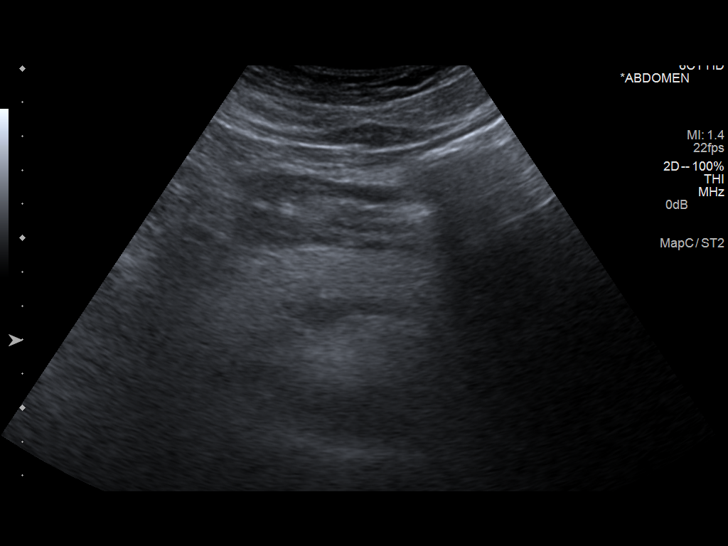
[im 85/127]
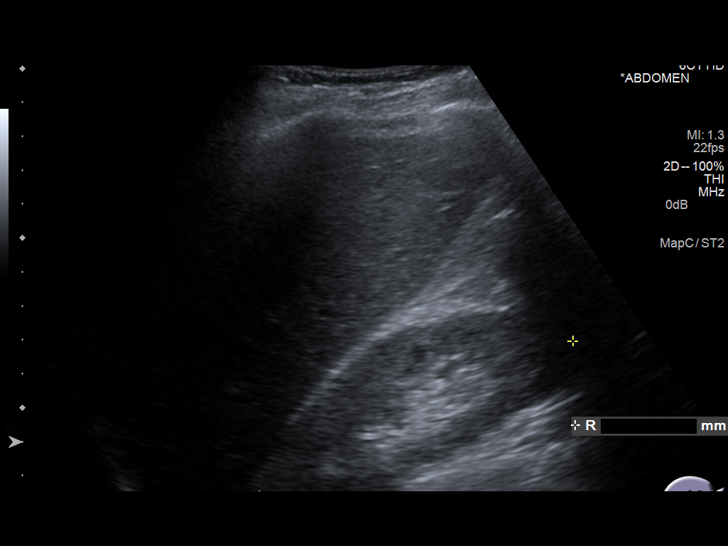
[im 95/127]
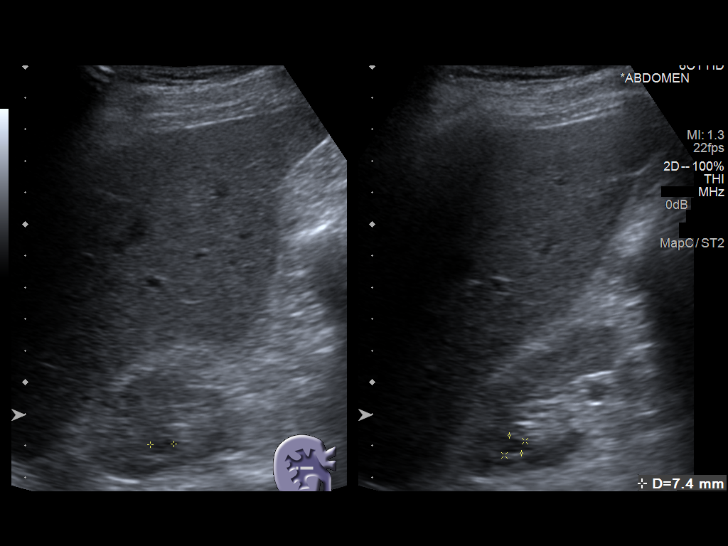
[im 106/127]
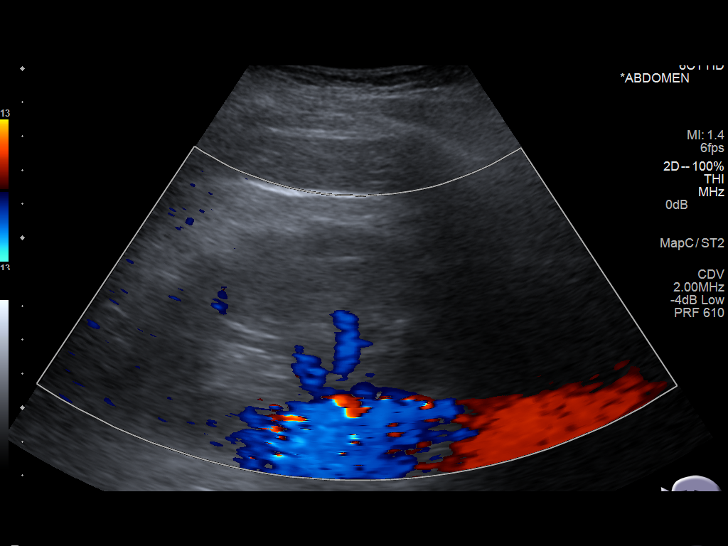
[im 116/127]
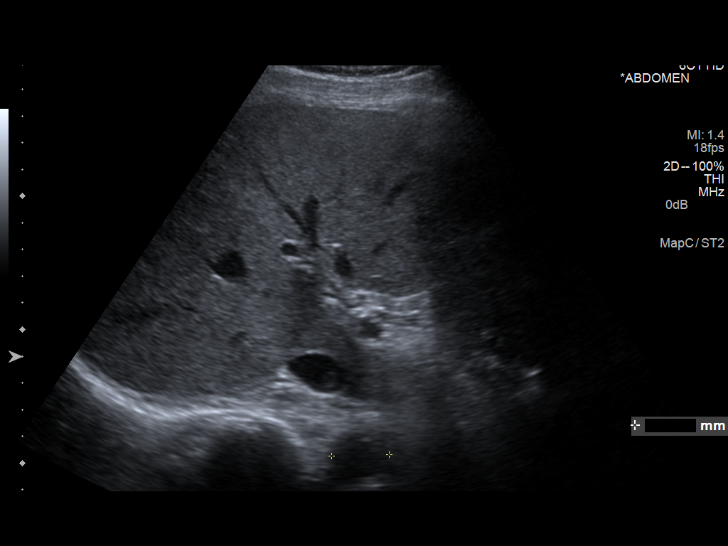
[im 127/127]
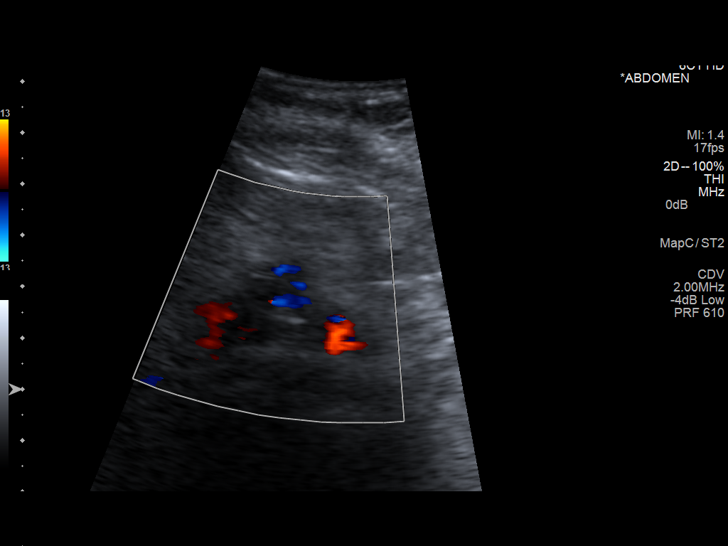

[13 of 25 positions shown; findings below may reference images not displayed]

FINDINGS: ULTRASOUND ABDOMEN

Gallbladder: Gallbladder has a normal appearance. Gallbladder wall
is 2.8 mm, within normal limits. No stones or pericholecystic fluid.
No sonographic Murphy's sign.

Common bile duct: Diameter: 3.4 mm

Liver: No focal lesion identified. Within normal limits in
parenchymal echogenicity.

IVC: No abnormality visualized.

Pancreas: Visualized portion unremarkable. Pancreatic tail is not
fully evaluated because of bowel gas.

Spleen: Visualized portion has normal size and appearance.

Right Kidney: Length: 10.3 cm. Echogenicity is normal. No
hydronephrosis. Small hypoechoic lesion is 0.7 x 0.8 x 0.7 cm in the
upper pole region. This lesion is too small to fully evaluate but
has features consistent with cyst.

Left Kidney: Length: 11.0 cm. Echogenicity within normal limits. No
mass or hydronephrosis visualized.

Abdominal aorta: 2.8 cm

Other findings: None.

ULTRASOUND HEPATIC ELASTOGRAPHY

Device: Siemens Helix VTQ

Transducer 6 C1 HD

Patient position: Left lateral decubitus

Number of measurements:  10

Hepatic Segment:  8

Median velocity:   1.35  m/sec

IQR:

IQR/Median velocity ratio

Corresponding Metavir fibrosis score:  F2 and some F3

Risk of fibrosis: Moderate

Limitations of exam: None

Pertinent findings noted on other imaging exams:  None

Please note that abnormal shear wave velocities may also be
identified in clinical settings other than with hepatic fibrosis,
such as: acute hepatitis, elevated right heart and central venous
pressures including use of beta blockers, Dedcj disease
(Tiger), infiltrative processes such as
mastocytosis/amyloidosis/infiltrative tumor, extrahepatic
cholestasis, in the post-prandial state, and liver transplantation.
Correlation with patient history, laboratory data, and clinical
condition recommended.
IMPRESSION: 1. Normal grayscale evaluation of the liver.
2. Small probable right renal cyst.  No hydronephrosis.
3. Median hepatic shear wave velocity is calculated at 1.35 m/sec.
4. Corresponding Metavir fibrosis score is F2 and some F3.
5. Risk of fibrosis is moderate.
6. Follow-up:  Additional testing as appropriate.

## 2017-11-15 ENCOUNTER — Emergency Department (HOSPITAL_BASED_OUTPATIENT_CLINIC_OR_DEPARTMENT_OTHER): Admit: 2017-11-15 | Discharge: 2017-11-15 | Disposition: A | Discharge status: Home / Self Care | Payer: Medicare HMO

## 2017-11-15 ENCOUNTER — Encounter (HOSPITAL_COMMUNITY): Payer: Self-pay | Admitting: Emergency Medicine

## 2017-11-15 ENCOUNTER — Emergency Department (HOSPITAL_COMMUNITY)
Admission: EM | Admit: 2017-11-15 | Discharge: 2017-11-15 | Disposition: A | Discharge status: Home / Self Care | Payer: Medicare HMO | Attending: Emergency Medicine | Admitting: Emergency Medicine

## 2017-11-15 DIAGNOSIS — I825Y3 Chronic embolism and thrombosis of unspecified deep veins of proximal lower extremity, bilateral: Secondary | ICD-10-CM

## 2017-11-15 DIAGNOSIS — E7800 Pure hypercholesterolemia, unspecified: Secondary | ICD-10-CM | POA: Diagnosis not present

## 2017-11-15 DIAGNOSIS — R609 Edema, unspecified: Secondary | ICD-10-CM | POA: Diagnosis not present

## 2017-11-15 DIAGNOSIS — F17210 Nicotine dependence, cigarettes, uncomplicated: Secondary | ICD-10-CM | POA: Diagnosis not present

## 2017-11-15 DIAGNOSIS — I10 Essential (primary) hypertension: Secondary | ICD-10-CM | POA: Diagnosis not present

## 2017-11-15 DIAGNOSIS — R600 Localized edema: Secondary | ICD-10-CM | POA: Diagnosis present

## 2017-11-15 DIAGNOSIS — Z86718 Personal history of other venous thrombosis and embolism: Secondary | ICD-10-CM | POA: Insufficient documentation

## 2017-11-15 DIAGNOSIS — Z7901 Long term (current) use of anticoagulants: Secondary | ICD-10-CM | POA: Diagnosis not present

## 2017-11-15 DIAGNOSIS — Z79899 Other long term (current) drug therapy: Secondary | ICD-10-CM | POA: Insufficient documentation

## 2017-11-15 LAB — CBC WITH DIFFERENTIAL/PLATELET
ABS IMMATURE GRANULOCYTES: 0 10*3/uL (ref 0.0–0.1)
BASOS PCT: 1 %
Basophils Absolute: 0 10*3/uL (ref 0.0–0.1)
Eosinophils Absolute: 0.2 10*3/uL (ref 0.0–0.7)
Eosinophils Relative: 3 %
HCT: 42 % (ref 39.0–52.0)
Hemoglobin: 13.8 g/dL (ref 13.0–17.0)
IMMATURE GRANULOCYTES: 0 %
Lymphocytes Relative: 43 %
Lymphs Abs: 2.6 10*3/uL (ref 0.7–4.0)
MCH: 30.5 pg (ref 26.0–34.0)
MCHC: 32.9 g/dL (ref 30.0–36.0)
MCV: 92.7 fL (ref 78.0–100.0)
MONOS PCT: 9 %
Monocytes Absolute: 0.5 10*3/uL (ref 0.1–1.0)
NEUTROS ABS: 2.8 10*3/uL (ref 1.7–7.7)
NEUTROS PCT: 46 %
PLATELETS: 212 10*3/uL (ref 150–400)
RBC: 4.53 MIL/uL (ref 4.22–5.81)
RDW: 15.1 % (ref 11.5–15.5)
WBC: 6.1 10*3/uL (ref 4.0–10.5)

## 2017-11-15 LAB — BASIC METABOLIC PANEL
ANION GAP: 8 (ref 5–15)
BUN: 23 mg/dL — ABNORMAL HIGH (ref 6–20)
CO2: 26 mmol/L (ref 22–32)
Calcium: 9.2 mg/dL (ref 8.9–10.3)
Chloride: 107 mmol/L (ref 101–111)
Creatinine, Ser: 1.28 mg/dL — ABNORMAL HIGH (ref 0.61–1.24)
GFR, EST NON AFRICAN AMERICAN: 56 mL/min — AB (ref >60)
GLUCOSE: 85 mg/dL (ref 65–99)
POTASSIUM: 4.4 mmol/L (ref 3.5–5.1)
SODIUM: 141 mmol/L (ref 135–145)

## 2017-11-15 LAB — PROTIME-INR
INR: 2.01
Prothrombin Time: 22.6 seconds — ABNORMAL HIGH (ref 11.4–15.2)

## 2017-11-15 MED ORDER — APIXABAN 5 MG PO TABS: ORAL_TABLET | ORAL | 0 refills | Status: DC

## 2017-11-15 MED ORDER — HEPARIN BOLUS VIA INFUSION
2500.0000 [IU] | Freq: Once | INTRAVENOUS | Status: AC
  Administered 2017-11-15: 2500 [IU] via INTRAVENOUS
  Filled 2017-11-15: qty 2500

## 2017-11-15 MED ORDER — APIXABAN 5 MG PO TABS
10.0000 mg | ORAL_TABLET | Freq: Once | ORAL | Status: AC
  Administered 2017-11-15: 10 mg via ORAL
  Filled 2017-11-15: qty 2

## 2017-11-15 MED ORDER — HEPARIN (PORCINE) IN NACL 100-0.45 UNIT/ML-% IJ SOLN
1600.0000 [IU]/h | INTRAMUSCULAR | Status: DC
  Administered 2017-11-15: 1600 [IU]/h via INTRAVENOUS
  Filled 2017-11-15: qty 250

## 2017-11-15 NOTE — Progress Notes (Signed)
VASCULAR LAB PRELIMINARY  PRELIMINARY  PRELIMINARY  PRELIMINARY  Bilateral lower extremity venous duplex completed.    Preliminary report:  There is age indeterminate DVT noted in the right femoral and posterior tibial veins, and age indeterminate DVT noted in the left peroneal vein.  Yahayra Geis, RVT 11/15/2017, 6:39 PM

## 2017-11-15 NOTE — Discharge Instructions (Addendum)
Information on my medicine - ELIQUIS (apixaban)  This medication education was reviewed with me or my healthcare representative as part of my discharge preparation.  The pharmacist that spoke with me during my hospital stay was:  Sampson SiMancheril, Benjamin G, [facility]  Why was Eliquis prescribed for you? Eliquis was prescribed to treat blood clots that may have been found in the veins of your legs (deep vein thrombosis) or in your lungs (pulmonary embolism) and to reduce the risk of them occurring again.  What do You need to know about Eliquis ? The starting dose is 10 mg (two 5 mg tablets) taken TWICE daily for the FIRST SEVEN (7) DAYS, then on (enter date)  11/23/17  the dose is reduced to ONE 5 mg tablet taken TWICE daily.  Eliquis may be taken with or without food.   Try to take the dose about the same time in the morning and in the evening. If you have difficulty swallowing the tablet whole please discuss with your pharmacist how to take the medication safely.  Take Eliquis exactly as prescribed and DO NOT stop taking Eliquis without talking to the doctor who prescribed the medication.  Stopping may increase your risk of developing a new blood clot.  Refill your prescription before you run out.  After discharge, you should have regular check-up appointments with your healthcare provider that is prescribing your Eliquis.    What do you do if you miss a dose? If a dose of ELIQUIS is not taken at the scheduled time, take it as soon as possible on the same day and twice-daily administration should be resumed. The dose should not be doubled to make up for a missed dose.  Important Safety Information A possible side effect of Eliquis is bleeding. You should call your healthcare provider right away if you experience any of the following: ? Bleeding from an injury or your nose that does not stop. ? Unusual colored urine (red or dark brown) or unusual colored stools (red or black). ? Unusual  bruising for unknown reasons. ? A serious fall or if you hit your head (even if there is no bleeding).  Some medicines may interact with Eliquis and might increase your risk of bleeding or clotting while on Eliquis. To help avoid this, consult your healthcare provider or pharmacist prior to using any new prescription or non-prescription medications, including herbals, vitamins, non-steroidal anti-inflammatory drugs (NSAIDs) and supplements.  This website has more information on Eliquis (apixaban): http://[email or web address].com/eliquis/home

## 2017-11-15 NOTE — Progress Notes (Signed)
ANTICOAGULATION CONSULT NOTE - Initial Consult  Pharmacy Consult for heparin Indication: DVT  No Known Allergies  Patient Measurements: Height: 6\' 1"  (185.4 cm) Weight: 220 lb (99.8 kg) IBW/kg (Calculated) : 79.9 Heparin Dosing Weight: 99.8 kg  Vital Signs: Temp: 98.4 F (36.9 C) (06/09 1659) BP: 143/97 (06/09 1659) Pulse Rate: 57 (06/09 1659)  Labs: Recent Labs    11/15/17 1716  HGB 13.8  HCT 42.0  PLT 212  LABPROT 22.6*  INR 2.01  CREATININE 1.28*    Estimated Creatinine Clearance: 69.6 mL/min (A) (by C-G formula based on SCr of 1.28 mg/dL (H)).   Medical History: Past Medical History:  Diagnosis Date  . Arthritis   . Complication of anesthesia    slow to wake up after last surgery  . Depression   . GERD (gastroesophageal reflux disease)   . Hepatitis    hep c  . High blood pressure   . High cholesterol   . Neuropathy   . Peripheral neuropathy    feet and hands   Assessment: 66 yom with known blood clots in both legs x1 month ago. Was at follow up appointment and noticed swelling with R>L leg.   Was started on warfarin for DVTs - INR is 2.01 on admission. However, duplex noted in R femoral/posterior tibial veins, L peroneal vein.   Hgb 13.8, plt 212. No s/sx of bleeding. Given notable DVTs with therapeutic INR, will reduce bolus for heparin infusion start.  Goal of Therapy:  Heparin level 0.3-0.7 units/ml Monitor platelets by anticoagulation protocol: Yes   Plan:  Give 2500 units bolus x 1 Start heparin infusion at 1600 units/hr Check anti-Xa level in 6 hours and daily while on heparin Continue to monitor H&H and platelets  Girard CooterKimberly Perkins, PharmD Clinical Pharmacist  Pager: [phone] Phone: [phone] 11/15/2017,7:32 PM

## 2017-11-15 NOTE — ED Provider Notes (Signed)
[facility] EMERGENCY DEPARTMENT Provider Note   CSN: [id number] Arrival date & time: 11/15/17  1647   History   Chief Complaint No chief complaint on file.   HPI Thomas Mcneil is a 67 y.o. male.  The history is provided by the patient and medical records.   67 yo M with PMHx bilateral DVT (started on warfarin at [facility] 30+ days ago) who presents with worsening edema to BLE  (R>L) for past several days. States he has been compliant with warfarin. Walking around makes edema worse. States edema is not improved with elevated of BLE. Has fallen at home because he states his legs are heavier. Denies fever, nausea, vomiting. Denies dyspnea, CP.    Past Medical History:  Diagnosis Date  . Arthritis   . Complication of anesthesia    slow to wake up after last surgery  . Depression   . GERD (gastroesophageal reflux disease)   . Hepatitis    hep c  . High blood pressure   . High cholesterol   . Neuropathy   . Peripheral neuropathy    feet and hands    Patient Active Problem List   Diagnosis Date Noted  . Cocaine dependence, binge pattern (HCC) 11/26/2015  . Substance induced mood disorder (HCC) 11/26/2015  . Intention tremor 11/26/2015  . Alcohol use disorder, severe, dependence (HCC) 09/04/2015  . Severe recurrent major depression without psychotic features (HCC) 09/04/2015  . Alcohol dependence, uncomplicated (HCC) 09/01/2015  . Alcohol-induced mood disorder (HCC) 09/01/2015  . Cocaine abuse (HCC) 09/01/2015  . Alcohol abuse 04/05/2014  . Essential tremor 01/04/2014  . Other and unspecified hyperlipidemia 01/04/2014  . B12 deficiency 01/04/2014  . Melena 10/23/2012  . GERD 05/27/2010  . SHOULDER PAIN, LEFT 05/21/2010  . ONYCHOMYCOSIS, TOENAILS 05/09/2010  . KNEE PAIN, BILATERAL 06/27/2009  . LEG CRAMPS 06/27/2009  . CONTUSION OF UNSPECIFIED SITE 08/18/2008  . LEG EDEMA, BILATERAL 01/13/2008  . ERECTILE DYSFUNCTION 10/15/2007  .  TOBACCO ABUSE 08/16/2007  . Mononeuritis of lower limb 08/16/2007  . CORNS AND CALLUSES 08/16/2007  . ALCOHOL ABUSE, HX OF 08/16/2007    Past Surgical History:  Procedure Laterality Date  . DIRECT LARYNGOSCOPY  12/26/2011   Procedure: DIRECT LARYNGOSCOPY;  Surgeon: Melvenia Beam, MD;  Location: [facility] OR;  Service: ENT;  Laterality: N/A;  Directy Laryngoscopy with biopsy [id number]  . DIRECT LARYNGOSCOPY N/A 10/21/2012   Procedure: DIRECT LARYNGOSCOPY;  Surgeon: Melvenia Beam, MD;  Location: [facility] OR;  Service: ENT;  Laterality: N/A;  . ESOPHAGOGASTRODUODENOSCOPY N/A 10/25/2012   Procedure: ESOPHAGOGASTRODUODENOSCOPY (EGD);  Surgeon: Charna Elizabeth, MD;  Location: [facility] ENDOSCOPY;  Service: Endoscopy;  Laterality: N/A;  . TONSILLECTOMY          Home Medications    Prior to Admission medications   Medication Sig Start Date End Date Taking? Authorizing Provider  Ascorbic Acid (VITAMIN C) 1000 MG tablet Take 1 tablet (1,000 mg total) by mouth daily. For Vitamin C supplementation 09/13/15  Yes Nwoko, Nicole Kindred I, NP  fluticasone (FLONASE) 50 MCG/ACT nasal spray Place 2 sprays into both nostrils daily as needed for allergies. For allergies 09/13/15  Yes Nwoko, Nicole Kindred I, NP  lovastatin (MEVACOR) 20 MG tablet Take 1 tablet (20 mg total) by mouth at bedtime. For High Cholesterol 09/13/15  Yes Armandina Stammer I, NP  omeprazole (PRILOSEC) 40 MG capsule Take one tablet by mouth once daily for stomach: for acid reflux 09/13/15  Yes Armandina Stammer I, NP  pregabalin (LYRICA) 150  MG capsule Take 1 capsule (150 mg total) by mouth 2 (two) times daily. Patient taking differently: Take 225 mg by mouth 2 (two) times daily.  12/10/15 11/15/17 Yes Court JoyKober, Charles E, PA-C  primidone (MYSOLINE) 250 MG tablet Take 250 mg by mouth 3 (three) times daily. 11/08/17  Yes [provider]  propranolol (INDERAL) 80 MG tablet Take 1 tablet (80 mg total) by mouth 2 (two) times daily. For high blood pressure 09/13/15  Yes Nwoko, Nicole KindredAgnes I, NP  traZODone  (DESYREL) 100 MG tablet Take 2 tablets (200 mg total) by mouth at bedtime. For sleep Patient taking differently: Take 100 mg by mouth at bedtime. For sleep 09/13/15  Yes Armandina StammerNwoko, Agnes I, NP  acamprosate (CAMPRAL) 333 MG tablet Take 2 tablets (666 mg total) by mouth 3 (three) times daily with meals. Patient not taking: Reported on 11/15/2017 11/26/15   Court JoyKober, Charles E, PA-C  apixaban (ELIQUIS) 5 MG TABS tablet Take 10mg  (two 5mg  tablets) twice daily for 7 days, then take 5mg  twice daily for 21 days. 11/15/17   Diannia Ruderucker, Penne Rosenstock, MD  escitalopram (LEXAPRO) 5 MG tablet Take 3 tablets (15 mg total) by mouth daily. For depression Patient not taking: Reported on 11/15/2017 09/13/15   Armandina StammerNwoko, Agnes I, NP  gabapentin (NEURONTIN) 300 MG capsule Take 1 capsule (300 mg total) by mouth 3 (three) times daily. For agitation Patient not taking: Reported on 12/27/2015 09/13/15   Armandina StammerNwoko, Agnes I, NP  nicotine (NICODERM CQ - DOSED IN MG/24 HOURS) 21 mg/24hr patch Place 1 patch (21 mg total) onto the skin daily. For nicotine addiction Patient not taking: Reported on 11/26/2015 09/13/15   Armandina StammerNwoko, Agnes I, NP  venlafaxine XR (EFFEXOR XR) 75 MG 24 hr capsule Take 1 capsule daily for 7 days then increase to 2 capsules daily Patient not taking: Reported on 12/27/2015 11/26/15   Court JoyKober, Charles E, PA-C  zolpidem (AMBIEN) 5 MG tablet Take 1 tablet (5 mg total) by mouth at bedtime as needed for sleep. Patient not taking: Reported on 11/26/2015 09/13/15   Sanjuana KavaNwoko, Agnes I, NP    Family History Family History  Problem Relation Age of Onset  . Hypertension Mother   . Diverticulosis Mother   . GER disease Mother   . Lung disease Sister   . Polymyositis Sister   . Alcohol abuse Maternal Uncle     Social History Social History   Tobacco Use  . Smoking status: Current Some Day Smoker    Packs/day: 0.25    Years: 45.00    Pack years: 11.25    Types: Cigarettes    Start date: 06/09/2012  . Tobacco comment: quitting, using e-cig  Substance Use  Topics  . Alcohol use: Yes    Alcohol/week: 42.0 oz    Types: 70 Cans of beer per week    Comment: Relas[employer] - currently in [facility] for counseling/rehab. last drink 06/29/14  . Drug use: Yes    Frequency: 0.2 times per week    Types: "Crack" cocaine    Comment: "Crack" quit 06/29/14     Allergies   Patient has no known allergies.   Review of Systems Review of Systems  Constitutional: Negative for chills and fever.  HENT: Negative for ear pain and sore throat.   Eyes: Negative for pain and visual disturbance.  Respiratory: Negative for cough and shortness of breath.   Cardiovascular: Positive for leg swelling. Negative for chest pain and palpitations.  Gastrointestinal: Negative for abdominal pain and vomiting.  Musculoskeletal: Positive  for gait problem (secondary to edema). Negative for arthralgias and back pain.  Skin: Negative for color change and rash.  Neurological: Negative for seizures and syncope.  All other systems reviewed and are negative.    Physical Exam Updated Vital Signs BP (!) 149/86   Pulse (!) 50   Temp 98.4 F (36.9 C)   Resp 16   Ht 6\' 1"  (1.854 m)   Wt 99.8 kg (220 lb)   SpO2 97%   BMI 29.03 kg/m   Physical Exam  Constitutional: He appears well-developed and well-nourished.  HENT:  Head: Normocephalic and atraumatic.  Mouth/Throat: Oropharynx is clear and moist.  Eyes: Conjunctivae are normal.  Neck: Neck supple.  Cardiovascular: Normal rate, regular rhythm and intact distal pulses.  No murmur heard. Pulmonary/Chest: Effort normal and breath sounds normal. No stridor. No respiratory distress.  Abdominal: Soft. He exhibits no distension. There is no tenderness.  Musculoskeletal: Normal range of motion. He exhibits edema (pitting edema to BLE (R>L)). He exhibits no tenderness or deformity.  Neurological: He is alert. No cranial nerve deficit or sensory deficit.  Skin: Skin is warm and dry. Ecchymosis noted.     Psychiatric: He has a normal mood  and affect.  Nursing note and vitals reviewed.    ED Treatments / Results  Labs (all labs ordered are listed, but only abnormal results are displayed) Labs Reviewed  BASIC METABOLIC PANEL - Abnormal; Notable for the following components:      Result Value   BUN 23 (*)    Creatinine, Ser 1.28 (*)    GFR calc non Af Amer 56 (*)    All other components within normal limits  PROTIME-INR - Abnormal; Notable for the following components:   Prothrombin Time 22.6 (*)    All other components within normal limits  CBC WITH DIFFERENTIAL/PLATELET  HEPARIN LEVEL (UNFRACTIONATED)  CBC    EKG None  Radiology No results found.  Procedures Procedures (including critical care time)  Medications Ordered in ED Medications  heparin bolus via infusion 2,500 Units (2,500 Units Intravenous Bolus from Bag 11/15/17 2004)  apixaban (ELIQUIS) tablet 10 mg (10 mg Oral Given 11/15/17 2257)     Initial Impression / Assessment and Plan / ED Course  I have reviewed the triage vital signs and the nursing notes.  Pertinent labs & imaging results that were available during my care of the patient were reviewed by me and considered in my medical decision making (see chart for details).     ION GONNELLA is a 67 y.o. male with PMHx of DVT (on warfarin) who p/w worsening BLE edema. Reviewed and confirmed nursing documentation for past medical history, family history, social history. VS afebrile, wnl. Exam remarkable for BLE (R>L) edema. Ddx includes concern for treatment failure on warfarin. No cardiopulmonary symptoms to suggest PE.   CBC wnl. BMP with stable Cr 1.28, otherwise unremarkable. INR 2.01. B/l venous duplex today with age indeterminate DVT noted in R femoral and posterior tibial veins, age indeterminate DVT note din left peroneal veins. Discussed case with hospitalist, Dr.Smith, who recommends starting Eliquis given likely treatment. He does not believe patient wound benefit from a prothrombotic w/u  at this time. Pharmacy provided card for free 30 days. First dose eliquis given here.   Old records reviewed. Labs reviewed by me and used in the medical decision making.  Imaging viewed and interpreted by me and used in the medical decision making (formal interpretation from radiologist). D/c home in stable condition,  return precautions discussed. Patient agreeable with plan for d/c home. He will f/u with his PCP next week.     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

## 2017-11-15 NOTE — ED Triage Notes (Signed)
Pt states he was diagnosed with blood clots to both legs 1 month ago [facility]. States he was on Warfarin for these blood clots and went to follow up apt today and they told him to come here because they felt the treatment was not working. Both lower extremities are swollen with right greater than left.

## 2020-05-29 ENCOUNTER — Encounter (INDEPENDENT_AMBULATORY_CARE_PROVIDER_SITE_OTHER): Payer: Self-pay | Admitting: Otolaryngology

## 2020-05-29 ENCOUNTER — Other Ambulatory Visit: Payer: Self-pay

## 2020-05-29 ENCOUNTER — Ambulatory Visit (INDEPENDENT_AMBULATORY_CARE_PROVIDER_SITE_OTHER): Payer: Medicare Other | Admitting: Otolaryngology

## 2020-05-29 VITALS — Temp 97.3°F

## 2020-05-29 DIAGNOSIS — H6123 Impacted cerumen, bilateral: Secondary | ICD-10-CM

## 2020-05-29 NOTE — Progress Notes (Signed)
HPI: Thomas Mcneil is a 69 y.o. male who presents for evaluation of wax buildup in his ears.  This was attempted to be flushed out by [facility] but they cannot remove it and he was referred here.  Denies any ear problems..  Past Medical History:  Diagnosis Date  . Arthritis   . Complication of anesthesia    slow to wake up after last surgery  . Depression   . GERD (gastroesophageal reflux disease)   . Hepatitis    hep c  . High blood pressure   . High cholesterol   . Neuropathy   . Peripheral neuropathy    feet and hands   Past Surgical History:  Procedure Laterality Date  . DIRECT LARYNGOSCOPY  12/26/2011   Procedure: DIRECT LARYNGOSCOPY;  Surgeon: Melvenia Beam, MD;  Location: [facility] OR;  Service: ENT;  Laterality: N/A;  Directy Laryngoscopy with biopsy [id number]  . DIRECT LARYNGOSCOPY N/A 10/21/2012   Procedure: DIRECT LARYNGOSCOPY;  Surgeon: Melvenia Beam, MD;  Location: [facility] OR;  Service: ENT;  Laterality: N/A;  . ESOPHAGOGASTRODUODENOSCOPY N/A 10/25/2012   Procedure: ESOPHAGOGASTRODUODENOSCOPY (EGD);  Surgeon: Charna Elizabeth, MD;  Location: [facility] ENDOSCOPY;  Service: Endoscopy;  Laterality: N/A;  . TONSILLECTOMY     Social History   Socioeconomic History  . Marital status: Widowed    Spouse name: Not on file  . Number of children: Not on file  . Years of education: Not on file  . Highest education level: Not on file  Occupational History  . Not on file  Tobacco Use  . Smoking status: Current Some Day Smoker    Packs/day: 0.25    Years: 45.00    Pack years: 11.25    Types: Cigarettes    Start date: 06/09/2012  . Smokeless tobacco: Never Used  . Tobacco comment: quitting, using e-cig  Substance and Sexual Activity  . Alcohol use: Yes    Alcohol/week: 70.0 standard drinks    Types: 70 Cans of beer per week    Comment: [employer] - currently in [facility] for counseling/rehab. last drink 06/29/14  . Drug use: Yes    Frequency: 0.2 times per week    Types: "Crack" cocaine    Comment:  "Crack" quit 06/29/14  . Sexual activity: Not on file  Other Topics Concern  . Not on file  Social History Narrative  . Not on file   Social Determinants of Health   Financial Resource Strain: Not on file  Food Insecurity: Not on file  Transportation Needs: Not on file  Physical Activity: Not on file  Stress: Not on file  Social Connections: Not on file   Family History  Problem Relation Age of Onset  . Hypertension Mother   . Diverticulosis Mother   . GER disease Mother   . Lung disease Sister   . Polymyositis Sister   . Alcohol abuse Maternal Uncle    No Known Allergies Prior to Admission medications   Medication Sig Start Date End Date Taking? Authorizing Provider  acamprosate (CAMPRAL) 333 MG tablet Take 2 tablets (666 mg total) by mouth 3 (three) times daily with meals. Patient not taking: Reported on 11/15/2017 11/26/15   Court Joy, PA-C  apixaban (ELIQUIS) 5 MG TABS tablet Take 10mg  (two 5mg  tablets) twice daily for 7 days, then take 5mg  twice daily for 21 days. 11/15/17   , MD  Ascorbic Acid (VITAMIN C) 1000 MG tablet Take 1 tablet (1,000 mg total) by mouth daily. For Vitamin  C supplementation 09/13/15   Armandina Stammer I, NP  escitalopram (LEXAPRO) 5 MG tablet Take 3 tablets (15 mg total) by mouth daily. For depression Patient not taking: Reported on 11/15/2017 09/13/15   Armandina Stammer I, NP  fluticasone (FLONASE) 50 MCG/ACT nasal spray Place 2 sprays into both nostrils daily as needed for allergies. For allergies 09/13/15   Armandina Stammer I, NP  gabapentin (NEURONTIN) 300 MG capsule Take 1 capsule (300 mg total) by mouth 3 (three) times daily. For agitation Patient not taking: Reported on 12/27/2015 09/13/15   Armandina Stammer I, NP  lovastatin (MEVACOR) 20 MG tablet Take 1 tablet (20 mg total) by mouth at bedtime. For High Cholesterol 09/13/15   Nwoko, Nicole Kindred I, NP  nicotine (NICODERM CQ - DOSED IN MG/24 HOURS) 21 mg/24hr patch Place 1 patch (21 mg total) onto the skin daily.  For nicotine addiction Patient not taking: Reported on 11/26/2015 09/13/15   Armandina Stammer I, NP  omeprazole (PRILOSEC) 40 MG capsule Take one tablet by mouth once daily for stomach: for acid reflux 09/13/15   Armandina Stammer I, NP  pregabalin (LYRICA) 150 MG capsule Take 1 capsule (150 mg total) by mouth 2 (two) times daily. Patient taking differently: Take 225 mg by mouth 2 (two) times daily.  12/10/15 11/15/17  Court Joy, PA-C  primidone (MYSOLINE) 250 MG tablet Take 250 mg by mouth 3 (three) times daily. 11/08/17   [provider]  propranolol (INDERAL) 80 MG tablet Take 1 tablet (80 mg total) by mouth 2 (two) times daily. For high blood pressure 09/13/15   Armandina Stammer I, NP  traZODone (DESYREL) 100 MG tablet Take 2 tablets (200 mg total) by mouth at bedtime. For sleep Patient taking differently: Take 100 mg by mouth at bedtime. For sleep 09/13/15   Armandina Stammer I, NP  venlafaxine XR (EFFEXOR XR) 75 MG 24 hr capsule Take 1 capsule daily for 7 days then increase to 2 capsules daily Patient not taking: Reported on 12/27/2015 11/26/15   Court Joy, PA-C  zolpidem (AMBIEN) 5 MG tablet Take 1 tablet (5 mg total) by mouth at bedtime as needed for sleep. Patient not taking: Reported on 11/26/2015 09/13/15   Armandina Stammer I, NP     Positive ROS: Otherwise negative  All other systems have been reviewed and were otherwise negative with the exception of those mentioned in the HPI and as above.  Physical Exam: Constitutional: Alert, well-appearing, no acute distress Ears: External ears without lesions or tenderness. Ear canals with flaky wax in both ear canals that was cleaned with forceps.  TMs were clear bilaterally with good mobility on pneumatic otoscopy and no TM abnormality noted.. Nasal: External nose without lesions. Clear nasal passages Oral: Oropharynx clear. Neck: No palpable adenopathy or masses Respiratory: Breathing comfortably  Skin: No facial/neck lesions or rash noted.  Cerumen  impaction removal  Date/Time: 05/29/2020 2:21 PM Performed by: Drema Halon, MD Authorized by: Drema Halon, MD   Consent:    Consent obtained:  Verbal   Consent given by:  Patient   Risks discussed:  Pain and bleeding Procedure details:    Location:  L ear and R ear   Procedure type: curette and forceps   Post-procedure details:    Inspection:  TM intact and canal normal   Hearing quality:  Improved   Patient tolerance of procedure:  Tolerated well, no immediate complications Comments:     TMs are clear bilaterally.    Assessment: Cerumen  buildup in both ear canals.  Plan: This was cleaned in the office with forceps.  TMs were clear bilaterally.  Narda Bonds, MD

## 2020-12-13 ENCOUNTER — Other Ambulatory Visit: Payer: Self-pay | Admitting: Family

## 2020-12-13 DIAGNOSIS — M25562 Pain in left knee: Secondary | ICD-10-CM

## 2022-02-28 ENCOUNTER — Encounter: Payer: Self-pay | Admitting: Neurology

## 2022-05-12 ENCOUNTER — Ambulatory Visit (INDEPENDENT_AMBULATORY_CARE_PROVIDER_SITE_OTHER): Payer: Medicare Other | Admitting: Neurology

## 2022-05-12 ENCOUNTER — Encounter: Payer: Self-pay | Admitting: Neurology

## 2022-05-12 VITALS — BP 155/90 | HR 75 | Ht 73.0 in | Wt 186.0 lb

## 2022-05-12 DIAGNOSIS — G250 Essential tremor: Secondary | ICD-10-CM | POA: Diagnosis not present

## 2022-05-12 DIAGNOSIS — G629 Polyneuropathy, unspecified: Secondary | ICD-10-CM

## 2022-05-12 MED ORDER — NORTRIPTYLINE HCL 10 MG PO CAPS: 10.0000 mg | ORAL_CAPSULE | Freq: Every day | ORAL | 3 refills | Status: DC

## 2022-05-12 NOTE — Progress Notes (Unsigned)
[facility] Clinic Note - Initial Visit   Date: 05/12/2022   Thomas KosCarl E Jarema MRN: [id number] DOB: 07/08/1950   Dear Dr. Reita MayJoshi:  Thank you for your kind referral of Thomas Mcneil for consultation of painful neuropathy. Although his history is well known to you, please allow us to reiterate it for the purpose of our medical record. The patient was accompanied to the clinic by self.    Thomas KosCarl E Erlandson is a 71 y.o. right-handed male with depression, GERD, hyperlipidemia, hypertension, hepatitis C, history of alcohol abuse, and essential tremor presenting for evaluation of neuropathy.   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  He has a long history of neuropathy and over the past several years, pain has been getting worse.  He has previously tried gabapentin and currently on Cymbalta 60mg  BID and Lyrica 225mg  BID.  I will offer a low dose of nortriptyline 10mg  at bedtime and counseled patient on side-effects including potential drug-drug interaction (serotonin syndrome). I will refer him to pain management for ongoing management. .  Essential tremor affecting the hands, severe.  Patient not interested in DBS at this time.  He takes propranolol 80mg  BID and primidone 50mg  TID. He may benefit from seeing movement disorder specialist, if symptoms get worse.    ------------------------------------------------------------- History of present illness: For the past 20+ years, he has numbness/tingling in the feet and lower legs, which later started to involve the hands.  Symptoms are constant.  He has associated weakness in the hands and imbalance.  He uses a cane for mobility.  Over the past few years, he has been having increased pain in the legs.  He tried gabapentin and currently on Lyrica 225mg  BID and Cymbalta 60mg  twice daily.  He was referred for pain management.  Patient educated on daily foot inspection,  fall prevention, and safety precautions around the home.   Patient also has a long history of essential tremor involving the hands.  Tremors are severe and affects his ADLs.  He has difficulty with holding objects and frequently drops them.  He is on primidone 250mg  BID and propranolol 80mg  TID.  [facility].  He has not seen movement disorder specialist.    Past Medical History:  Diagnosis Date   Arthritis    Complication of anesthesia    slow to wake up after last surgery   Depression    GERD (gastroesophageal reflux disease)    Hepatitis    hep c   High blood pressure    High cholesterol    Neuropathy    Peripheral neuropathy    feet and hands    Past Surgical History:  Procedure Laterality Date   DIRECT LARYNGOSCOPY  12/26/2011   Procedure: DIRECT LARYNGOSCOPY;  Surgeon: Melvenia BeamMitchell Gore, MD;  [facility];  Service: ENT;  Laterality: N/A;  Directy Laryngoscopy with biopsy [id number]   DIRECT LARYNGOSCOPY N/A 10/21/2012   Procedure: DIRECT LARYNGOSCOPY;  Surgeon: Melvenia BeamMitchell Gore, MD;  [facility];  Service: ENT;  Laterality: N/A;   ESOPHAGOGASTRODUODENOSCOPY N/A 10/25/2012   Procedure: ESOPHAGOGASTRODUODENOSCOPY (EGD);  Surgeon: Charna ElizabethJyothi Mann, MD;  [facility];  Service: Endoscopy;  Laterality: N/A;   TONSILLECTOMY       Medications:  Outpatient Encounter Medications as of 05/12/2022  Medication Sig   Ascorbic Acid (VITAMIN C) 1000 MG tablet Take 1 tablet (1,000 mg total) by mouth daily. For Vitamin C supplementation   cyanocobalamin (VITAMIN B12) 500 MCG tablet  Take 2 tablets by mouth every morning.   DULoxetine (CYMBALTA) 60 MG capsule 60 mg. Take one capsule by mouth twice a day.   fluticasone (FLONASE) 50 MCG/ACT nasal spray Place 2 sprays into both nostrils daily as needed for allergies. For allergies   furosemide (LASIX) 20 MG tablet TAKE 1 TABLET BY MOUTH ONCE A DAY AS NEEDED FOR LEG SWELLING   lovastatin (MEVACOR) 20 MG tablet Take 1  tablet (20 mg total) by mouth at bedtime. For High Cholesterol   omeprazole (PRILOSEC) 40 MG capsule Take one tablet by mouth once daily for stomach: for acid reflux   pregabalin (LYRICA) 150 MG capsule Take 1 capsule (150 mg total) by mouth 2 (two) times daily. (Patient taking differently: Take 225 mg by mouth 2 (two) times daily.)   propranolol (INDERAL) 80 MG tablet Take 1 tablet (80 mg total) by mouth 2 (two) times daily. For high blood pressure   traZODone (DESYREL) 100 MG tablet Take 2 tablets (200 mg total) by mouth at bedtime. For sleep (Patient taking differently: Take 100 mg by mouth at bedtime. For sleep)   acamprosate (CAMPRAL) 333 MG tablet Take 2 tablets (666 mg total) by mouth 3 (three) times daily with meals. (Patient not taking: Reported on 11/15/2017)   apixaban (ELIQUIS) 5 MG TABS tablet Take 10mg  (two 5mg  tablets) twice daily for 7 days, then take 5mg  twice daily for 21 days. (Patient not taking: Reported on 05/12/2022)   escitalopram (LEXAPRO) 5 MG tablet Take 3 tablets (15 mg total) by mouth daily. For depression (Patient not taking: Reported on 11/15/2017)   gabapentin (NEURONTIN) 300 MG capsule Take 1 capsule (300 mg total) by mouth 3 (three) times daily. For agitation (Patient not taking: Reported on 12/27/2015)   primidone (MYSOLINE) 250 MG tablet Take 250 mg by mouth 3 (three) times daily. (Patient not taking: Reported on 05/12/2022)   [DISCONTINUED] nicotine (NICODERM CQ - DOSED IN MG/24 HOURS) 21 mg/24hr patch Place 1 patch (21 mg total) onto the skin daily. For nicotine addiction (Patient not taking: Reported on 11/26/2015)   [DISCONTINUED] venlafaxine XR (EFFEXOR XR) 75 MG 24 hr capsule Take 1 capsule daily for 7 days then increase to 2 capsules daily (Patient not taking: Reported on 12/27/2015)   [DISCONTINUED] zolpidem (AMBIEN) 5 MG tablet Take 1 tablet (5 mg total) by mouth at bedtime as needed for sleep. (Patient not taking: Reported on 11/26/2015)   No facility-administered  encounter medications on file as of 05/12/2022.    Allergies: No Known Allergies  Family History: Family History  Problem Relation Age of Onset   Hypertension Mother    Diverticulosis Mother    GER disease Mother    Lung disease Sister    Polymyositis Sister    Alcohol abuse Maternal Uncle     Social History: Social History   Tobacco Use   Smoking status: Some Days    Packs/day: 0.25    Years: 45.00    Total pack years: 11.25    Types: Cigarettes    Start date: 06/09/2012   Smokeless tobacco: Never   Tobacco comments:    quitting, using e-cig    05/12/2022- smokes on occasion  Substance Use Topics   Alcohol use: Not Currently    Alcohol/week: 70.0 standard drinks of alcohol    Types: 70 Cans of beer per week    Comment: Relas[place] - currently in [facility] for counseling/rehab. last drink 06/29/14   Drug use: Not Currently    Frequency: 0.2 times  per week    Types: "Crack" cocaine    Comment: "Crack" quit 06/29/14   Social History   Social History Narrative   Are you right handed or left handed? Right Handed    Are you currently employed ? No    What is your current occupation?   Do you live at home alone? No    Who lives with you? Lives with mother    What type of home do you live in: 1 story or 2 story? Lives in a one story home         Vital Signs:  BP (!) 155/90   Pulse 75   Ht 6\' 1"  (1.854 m)   Wt 186 lb (84.4 kg)   SpO2 95%   BMI 24.54 kg/m    Neurological Exam: MENTAL STATUS including orientation to time, place, person, recent and remote memory, attention span and concentration, language, and fund of knowledge is normal.  Speech is not dysarthric.  CRANIAL NERVES: II:  No visual field defects.     III-IV-VI: Pupils equal round and reactive to light.  Normal conjugate, extra-ocular eye movements in all directions of gaze.  No nystagmus.  No ptosis.   V:  Normal facial sensation.    VII:  Normal facial symmetry.  Intermittent facial twitch.  VIII:  Normal  hearing and vestibular function.   IX-X:  Normal palatal movement.   XI:  Normal shoulder shrug and head rotation.   XII:  Normal tongue strength and range of motion, no deviation or fasciculation.  MOTOR:  Muscle atrophy involving the intrinsic hand muscles.  Coarse and high amplitude bilateral action hand tremors.  Tremors are minimal at rest.  No pronator drift.   Upper Extremity:  Right  Left  Deltoid  5/5   5/5   Biceps  5/5   5/5   Triceps  5/5   5/5   Wrist extensors  5/5   5/5   Wrist flexors  5/5   5/5   Finger extensors  5/5   5/5   Finger flexors  5/5   5/5   Dorsal interossei  4/5   4/5   Abductor pollicis  4/5   4/5   Tone (Ashworth scale)  0  0   Lower Extremity:  Right  Left  Hip flexors  5/5   5/5   Knee flexors  5/5   5/5   Knee extensors  5/5   5/5   Dorsiflexors  5/5   5/5   Plantarflexors  5/5   5/5   Toe extensors  4/5   4/5   Toe flexors  4/5   4/5   Tone (Ashworth scale)  0  0   MSRs:                                           Right        Left brachioradialis 2+  2+  biceps 2+  2+  triceps 2+  2+  patellar 0  0  ankle jerk 0  0  plantar response down  down   SENSORY:  Sensation to all modalities reduced below the knee bilaterally and absent below the ankles.  Rhomberg's sign present.   COORDINATION/GAIT: Imprecision with finger-to- nose-finger due to tremor.  Intact rapid alternating movements bilaterally.  Gait is assisted with cane and appears unsteady with prominent hand  tremor when using hiking stick.     Thank you for allowing me to participate in patient's care.  If I can answer any additional questions, I would be pleased to do so.    Sincerely,    Ofilia Rayon K. Allena Katz, DO

## 2022-05-12 NOTE — Patient Instructions (Signed)
Start nortriptyline 10mg  at bedtime   We will refer you to pain management

## 2022-05-28 ENCOUNTER — Encounter: Payer: Self-pay | Admitting: Physical Medicine & Rehabilitation

## 2022-06-13 ENCOUNTER — Encounter: Payer: Medicare Other | Attending: Physical Medicine & Rehabilitation | Admitting: Physical Medicine & Rehabilitation

## 2022-06-13 ENCOUNTER — Encounter: Payer: Self-pay | Admitting: Physical Medicine & Rehabilitation

## 2022-06-13 VITALS — BP 136/77 | HR 95 | Ht 73.0 in | Wt 193.0 lb

## 2022-06-13 DIAGNOSIS — R2689 Other abnormalities of gait and mobility: Secondary | ICD-10-CM | POA: Insufficient documentation

## 2022-06-13 DIAGNOSIS — G629 Polyneuropathy, unspecified: Secondary | ICD-10-CM | POA: Insufficient documentation

## 2022-06-13 NOTE — Progress Notes (Signed)
Subjective:    Patient ID: Thomas Mcneil, male    DOB: [birth year], 72 y.o.   MRN: [id number]  HPI Thomas Mcneil is a 72 y.o. year old male  who  has a past medical history of Arthritis, Complication of anesthesia, Depression, GERD (gastroesophageal reflux disease), Hepatitis, High blood pressure, High cholesterol, Neuropathy, and Peripheral neuropathy.   They are presenting to PM&R clinic as a new patient for pain management evaluation. They were referred  for treatment of chronic pain.  Patient reports he has had worsening pain in his legs and hands for more than 20 years.  He also has has issues with essential tremors.  He is currently on Lyrica to 25 mg twice daily, Cymbalta 60 mg twice daily and was recently started on nortriptyline 10 mg at bedtime.  Patient reports medications help his pain however he does not feel like the pain is under control.  He uses a walker today. Pain is worse in his legs than his hands.  Pain in his legs is worse below his knees and in his hands mostly at the fingertips.  Patient reports altered balance however denies recent falls related to this.   Red flag symptoms: Patient denies saddle anesthesia, loss of bowel or bladder continence, new weakness, new numbness/tingling, or pain waking up at nighttime.  Medications tried: Gabapentin-used previously but this was changed to Lyrica after it was no longer controlling his pain Lyrica and Cymbalta-he thinks these are providing benefit to his pain Nortriptyline 10 mg started in addition to duloxetine and Lyrica-patient has not noted significant improvement Tylenol provides benefit   Pain Inventory Average Pain 9 Pain Right Now 9 My pain is sharp, burning, dull, stabbing, tingling, and aching  In the last 24 hours, has pain interfered with the following? General activity 8 Relation with others 9 Enjoyment of life 9 What TIME of day is your pain at its worst? daytime and night Sleep (in general) Good  Pain is  worse with: walking Pain improves with: rest Relief from Meds: 1  use a walker how many minutes can you walk? 5 ability to climb steps?  yes do you drive?  no  disabled: date disabled 2009 I need assistance with the following:  feeding, meal prep, and household duties  bladder control problems numbness tremor tingling trouble walking confusion depression anxiety  New pt  New pt    Family History  Problem Relation Age of Onset   Hypertension Mother    Diverticulosis Mother    GER disease Mother    Lung disease Sister    Polymyositis Sister    Alcohol abuse Maternal Uncle    Social History   Socioeconomic History   Marital status: Widowed    Spouse name: Not on file   Number of children: Not on file   Years of education: Not on file   Highest education level: Not on file  Occupational History   Not on file  Tobacco Use   Smoking status: Some Days    Packs/day: 0.25    Years: 45.00    Total pack years: 11.25    Types: Cigarettes    Start date: 06/09/2012   Smokeless tobacco: Never   Tobacco comments:    quitting, using e-cig    05/12/2022- smokes on occasion  Substance and Sexual Activity   Alcohol use: Not Currently    Alcohol/week: 70.0 standard drinks of alcohol    Types: 70 Cans of beer per week  Comment: [place] - currently in [facility] for counseling/rehab. last drink 06/29/14   Drug use: Not Currently    Frequency: 0.2 times per week    Types: "Crack" cocaine    Comment: "Crack" quit 06/29/14   Sexual activity: Not on file  Other Topics Concern   Not on file  Social History Narrative   Are you right handed or left handed? Right Handed    Are you currently employed ? No    What is your current occupation?   Do you live at home alone? No    Who lives with you? Lives with mother    What type of home do you live in: 1 story or 2 story? Lives in a one story home        Social Determinants of Health   Financial Resource Strain: Not on file  Food  Insecurity: Not on file  Transportation Needs: Not on file  Physical Activity: Not on file  Stress: Not on file  Social Connections: Not on file   Past Surgical History:  Procedure Laterality Date   DIRECT LARYNGOSCOPY  12/26/2011   Procedure: DIRECT LARYNGOSCOPY;  Surgeon: Melvenia Beam, MD;  Location: [facility] OR;  Service: ENT;  Laterality: N/A;  Directy Laryngoscopy with biopsy [id number]   DIRECT LARYNGOSCOPY N/A 10/21/2012   Procedure: DIRECT LARYNGOSCOPY;  Surgeon: Melvenia Beam, MD;  Location: [facility] OR;  Service: ENT;  Laterality: N/A;   ESOPHAGOGASTRODUODENOSCOPY N/A 10/25/2012   Procedure: ESOPHAGOGASTRODUODENOSCOPY (EGD);  Surgeon: Charna Elizabeth, MD;  Location: [facility] ENDOSCOPY;  Service: Endoscopy;  Laterality: N/A;   TONSILLECTOMY     Past Medical History:  Diagnosis Date   Arthritis    Complication of anesthesia    slow to wake up after last surgery   Depression    GERD (gastroesophageal reflux disease)    Hepatitis    hep c   High blood pressure    High cholesterol    Neuropathy    Peripheral neuropathy    feet and hands   BP 136/77   Pulse 95   Ht 6\' 1"  (1.854 m)   Wt 193 lb (87.5 kg)   SpO2 94%   BMI 25.46 kg/m   Opioid Risk Score:   Fall Risk Score:  `1  Depression screen PHQ 2/9     06/13/2022    1:11 PM 12/20/2015    5:20 PM 08/09/2014    2:57 PM 04/05/2014    8:25 AM 01/04/2014   10:03 AM  Depression screen PHQ 2/9  Decreased Interest 3  0 0 0  Down, Depressed, Hopeless 2  1 0 0  PHQ - 2 Score 5  1 0 0  Altered sleeping 0      Tired, decreased energy 3      Change in appetite 0      Feeling bad or failure about yourself  1      Trouble concentrating 0      Moving slowly or fidgety/restless 0      Suicidal thoughts 0      PHQ-9 Score 9      Difficult doing work/chores Somewhat difficult         Information is confidential and restricted. Go to Review Flowsheets to unlock data.     Review of Systems  Musculoskeletal:  Positive for gait problem.        Bilateral lower leg pain  Neurological:  Positive for tremors, weakness and numbness.  Psychiatric/Behavioral:  Positive for confusion and dysphoric mood. The  patient is nervous/anxious.   All other systems reviewed and are negative.     Objective:   Physical Exam  Gen: no distress, normal appearing HEENT: oral mucosa pink and moist, NCAT Cardio: Reg rate Chest: normal effort, normal rate of breathing Abd: soft, non-distended Ext: no edema Psych: Very pleasant Skin: intact Neuro: Cranial nerves II through XII intact, alert and awake, normal language and speech.  Follows commands.  Tremors in bilateral hands and arms that are worse with movement.  Only minimal tremors at rest. Sensation to light touch decreased in bilateral lower extremities below the mid thighs in a stocking glove distribution.  Altered sensation in all fingertips. 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 Discussed that this is a capsaicin patch, stronger than capsaicin cream. Discussed that it is currently approved for diabetic peripheral neuropathy and post-herpetic neuralgia, but that it has also shown benefit in treating other forms of neuropathy. Provided patient with link to site to learn more about the patch: [employer]. Discussed that the patch would be placed in office and benefits usually last 3 months. Discussed that unintended exposure to capsaicin can cause severe irritation of eyes,  mucous membranes, respiratory tract, and skin, but that Qutenza is a local treatment and does not have the systemic side effects of other nerve medications. Discussed that there may be pain, itching, erythema, and decreased sensory function associated with the application of Qutenza. Side effects usually subside within 1 week. A cold pack of analgesic medications can help with these side effects. Blood pressure can also be increased due to pain associated with administration of the patch.  -Consult placed for Xanax device discussed with patient -Tramadol may be an option to consider at a later time  Altered balance due to above -Discussed PT, patient will consider this

## 2022-07-10 ENCOUNTER — Telehealth: Payer: Self-pay

## 2022-07-10 NOTE — Telephone Encounter (Signed)
Called patient to go over qutenza benefits left VM to see if he still wanted procedure done    administration covered @ 100% $40 copay member responsible for 20% of allowable amount until OOP is met

## 2022-07-11 ENCOUNTER — Encounter: Payer: Medicare Other | Attending: Physical Medicine & Rehabilitation | Admitting: Physical Medicine & Rehabilitation

## 2022-07-11 ENCOUNTER — Encounter: Payer: Self-pay | Admitting: Physical Medicine & Rehabilitation

## 2022-07-11 VITALS — BP 148/83 | HR 82 | Ht 73.0 in | Wt 201.0 lb

## 2022-07-11 DIAGNOSIS — Z79891 Long term (current) use of opiate analgesic: Secondary | ICD-10-CM | POA: Diagnosis present

## 2022-07-11 DIAGNOSIS — G894 Chronic pain syndrome: Secondary | ICD-10-CM | POA: Insufficient documentation

## 2022-07-11 DIAGNOSIS — Z5181 Encounter for therapeutic drug level monitoring: Secondary | ICD-10-CM | POA: Diagnosis present

## 2022-07-11 NOTE — Progress Notes (Addendum)
Subjective:    Patient ID: Thomas Mcneil, male    DOB: April 26, 1951, 72 y.o.   MRN: [id number]  HPI   Thomas Mcneil is a 73 y.o. year old male  who  has a past medical history of Arthritis, Complication of anesthesia, Depression, GERD (gastroesophageal reflux disease), Hepatitis, High blood pressure, High cholesterol, Neuropathy, and Peripheral neuropathy.   They are presenting to PM&R clinic as a new patient for pain management evaluation. They were referred  for treatment of chronic pain.  Patient reports he has had worsening pain in his legs and hands for more than 20 years.  He also has has issues with essential tremors.  He is currently on Lyrica to 25 mg twice daily, Cymbalta 60 mg twice daily and was recently started on nortriptyline 10 mg at bedtime.  Patient reports medications help his pain however he does not feel like the pain is under control.  He uses a walker today. Pain is worse in his legs than his hands.  Pain in his legs is worse below his knees and in his hands mostly at the fingertips.  Patient reports altered balance however denies recent falls related to this.     Red flag symptoms: Patient denies saddle anesthesia, loss of bowel or bladder continence, new weakness, new numbness/tingling, or pain waking up at nighttime.   Medications tried: Gabapentin-used previously but this was changed to Lyrica after it was no longer controlling his pain Lyrica and Cymbalta-he thinks these are providing benefit to his pain Nortriptyline 10 mg started in addition to duloxetine and Lyrica-patient has not noted significant improvement Tylenol provides benefit Tramadol makes him feel bad   Interval history 07/11/2022 [name] is here for follow-up of his chronic neuropathy pain.  He continues on Lyrica, Cymbalta, nortriptyline as above.  He reports he ordered a TENS unit however had difficulty using this because of his tremor.  He may return this device if he is unable to operate it himself.   He is not able to have Qutenza completed due to cost.  He continues to have severe limiting pain in his lower extremities.  Pain Inventory Average Pain 8 Pain Right Now 8 My pain is aching  In the last 24 hours, has pain interfered with the following? General activity 0 Relation with others 0 Enjoyment of life 0 What TIME of day is your pain at its worst? morning , daytime, evening, and night Sleep (in general) Good  Pain is worse with: walking Pain improves with:  nothing Relief from Meds:  na  Family History  Problem Relation Age of Onset   Hypertension Mother    Diverticulosis Mother    GER disease Mother    Lung disease Sister    Polymyositis Sister    Alcohol abuse Maternal Uncle    Social History   Socioeconomic History   Marital status: Widowed    Spouse name: Not on file   Number of children: Not on file   Years of education: Not on file   Highest education level: Not on file  Occupational History   Not on file  Tobacco Use   Smoking status: Some Days    Packs/day: 0.25    Years: 45.00    Total pack years: 11.25    Types: Cigarettes    Start date: 06/09/2012   Smokeless tobacco: Never   Tobacco comments:    quitting, using e-cig    05/12/2022- smokes on occasion  Substance and Sexual Activity  Alcohol use: Not Currently    Alcohol/week: 70.0 standard drinks of alcohol    Types: 70 Cans of beer per week    Comment: Relas[employer] - currently in [facility] for counseling/rehab. last drink 06/29/14   Drug use: Not Currently    Frequency: 0.2 times per week    Types: "Crack" cocaine    Comment: "Crack" quit 06/29/14   Sexual activity: Not on file  Other Topics Concern   Not on file  Social History Narrative   Are you right handed or left handed? Right Handed    Are you currently employed ? No    What is your current occupation?   Do you live at home alone? No    Who lives with you? Lives with mother    What type of home do you live in: 1 story or 2 story? Lives in  a one story home        Social Determinants of Health   Financial Resource Strain: Not on file  Food Insecurity: Not on file  Transportation Needs: Not on file  Physical Activity: Not on file  Stress: Not on file  Social Connections: Not on file   Past Surgical History:  Procedure Laterality Date   DIRECT LARYNGOSCOPY  12/26/2011   Procedure: DIRECT LARYNGOSCOPY;  Surgeon: Ruby Cola, MD;  Location: [place];  Service: ENT;  Laterality: N/A;  Directy Laryngoscopy with biopsy [id number]   DIRECT LARYNGOSCOPY N/A 10/21/2012   Procedure: DIRECT LARYNGOSCOPY;  Surgeon: Ruby Cola, MD;  Location: [place];  Service: ENT;  Laterality: N/A;   ESOPHAGOGASTRODUODENOSCOPY N/A 10/25/2012   Procedure: ESOPHAGOGASTRODUODENOSCOPY (EGD);  Surgeon: Juanita Craver, MD;  Location: [facility] ENDOSCOPY;  Service: Endoscopy;  Laterality: N/A;   TONSILLECTOMY     Past Surgical History:  Procedure Laterality Date   DIRECT LARYNGOSCOPY  12/26/2011   Procedure: DIRECT LARYNGOSCOPY;  Surgeon: Ruby Cola, MD;  Location: [place];  Service: ENT;  Laterality: N/A;  Directy Laryngoscopy with biopsy [id number]   DIRECT LARYNGOSCOPY N/A 10/21/2012   Procedure: DIRECT LARYNGOSCOPY;  Surgeon: Ruby Cola, MD;  Location: [facility] OR;  Service: ENT;  Laterality: N/A;   ESOPHAGOGASTRODUODENOSCOPY N/A 10/25/2012   Procedure: ESOPHAGOGASTRODUODENOSCOPY (EGD);  Surgeon: Juanita Craver, MD;  Location: [facility] ENDOSCOPY;  Service: Endoscopy;  Laterality: N/A;   TONSILLECTOMY     Past Medical History:  Diagnosis Date   Arthritis    Complication of anesthesia    slow to wake up after last surgery   Depression    GERD (gastroesophageal reflux disease)    Hepatitis    hep c   High blood pressure    High cholesterol    Neuropathy    Peripheral neuropathy    feet and hands   BP (!) 148/83   Pulse 82   Ht '6\' 1"'$  (1.854 m)   Wt 201 lb (91.2 kg)   SpO2 96%   BMI 26.52 kg/m   Opioid Risk Score:   Fall Risk Score:  `1  Depression screen PHQ  2/9     06/13/2022    1:11 PM 12/20/2015    5:20 PM 08/09/2014    2:57 PM 04/05/2014    8:25 AM 01/04/2014   10:03 AM  Depression screen PHQ 2/9  Decreased Interest 3  0 0 0  Down, Depressed, Hopeless 2  1 0 0  PHQ - 2 Score 5  1 0 0  Altered sleeping 0      Tired, decreased energy 3  Change in appetite 0      Feeling bad or failure about yourself  1      Trouble concentrating 0      Moving slowly or fidgety/restless 0      Suicidal thoughts 0      PHQ-9 Score 9      Difficult doing work/chores Somewhat difficult         Information is confidential and restricted. Go to Review Flowsheets to unlock data.     Review of Systems  Musculoskeletal:        Leg pain  Neurological:  Positive for tremors and weakness.  All other systems reviewed and are negative.     Objective:   Physical Exam   Gen: no distress, normal appearing HEENT: oral mucosa pink and moist, NCAT Cardio: Reg rate Chest: normal effort, normal rate of breathing Abd: soft, non-distended Ext: no edema Psych: Very pleasant Skin: intact Neuro: Cranial nerves II through XII intact, alert and awake, normal language and speech.  Follows commands.  Tremors in bilateral hands and arms that are worse with movement.  Only minimal tremors at rest. Sensation to light touch decreased in bilateral lower extremities below the mid thighs in a stocking glove distribution.  Altered sensation in all fingertips. 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

## 2022-07-16 LAB — TOXASSURE SELECT,+ANTIDEPR,UR

## 2022-08-08 ENCOUNTER — Telehealth (HOSPITAL_COMMUNITY): Payer: Self-pay | Admitting: Physical Medicine & Rehabilitation

## 2022-08-08 MED ORDER — TAPENTADOL HCL ER 50 MG PO TB12: 50.0000 mg | ORAL_TABLET | Freq: Two times a day (BID) | ORAL | 0 refills | Status: DC

## 2022-08-12 ENCOUNTER — Telehealth: Payer: Self-pay | Admitting: *Deleted

## 2022-08-12 NOTE — Telephone Encounter (Signed)
PA initiated via telephone with Rosh B with [employer] for Nucynta 50 mg. There is 24 hours turnaround and office will be notified via fax. Ref # [id number] [employer] # [id number].  Patient notified PA pending.

## 2022-08-14 ENCOUNTER — Telehealth: Payer: Self-pay | Admitting: *Deleted

## 2022-08-14 NOTE — Telephone Encounter (Signed)
Thomas Mcneil is calling back about his PA on his medication.

## 2022-08-14 NOTE — Telephone Encounter (Signed)
Notified patient PA still pending. Nothing further needed

## 2022-08-15 NOTE — Telephone Encounter (Signed)
PA denied for Nucynta ER 50 MG The preferred drugs patient must try and fail. Belbuca buccal film Buprenorphine weekly transdermal patch Morphine ER tablet ER Xtampza ER capsule sprinkle

## 2022-08-16 DIAGNOSIS — M62541 Muscle wasting and atrophy, not elsewhere classified, right hand: Secondary | ICD-10-CM | POA: Diagnosis not present

## 2022-08-16 DIAGNOSIS — M79661 Pain in right lower leg: Secondary | ICD-10-CM | POA: Diagnosis not present

## 2022-08-19 DIAGNOSIS — R6889 Other general symptoms and signs: Secondary | ICD-10-CM | POA: Diagnosis not present

## 2022-08-25 ENCOUNTER — Encounter: Payer: Medicare HMO | Attending: Physical Medicine & Rehabilitation | Admitting: Physical Medicine & Rehabilitation

## 2022-08-25 ENCOUNTER — Encounter: Payer: Self-pay | Admitting: Physical Medicine & Rehabilitation

## 2022-08-25 VITALS — BP 157/98 | HR 88 | Ht 73.0 in | Wt 206.4 lb

## 2022-08-25 DIAGNOSIS — G629 Polyneuropathy, unspecified: Secondary | ICD-10-CM | POA: Insufficient documentation

## 2022-08-25 DIAGNOSIS — R2689 Other abnormalities of gait and mobility: Secondary | ICD-10-CM | POA: Insufficient documentation

## 2022-08-25 DIAGNOSIS — Z5181 Encounter for therapeutic drug level monitoring: Secondary | ICD-10-CM | POA: Diagnosis not present

## 2022-08-25 DIAGNOSIS — Z79891 Long term (current) use of opiate analgesic: Secondary | ICD-10-CM | POA: Diagnosis not present

## 2022-08-25 DIAGNOSIS — R6889 Other general symptoms and signs: Secondary | ICD-10-CM | POA: Diagnosis not present

## 2022-08-25 DIAGNOSIS — G894 Chronic pain syndrome: Secondary | ICD-10-CM | POA: Diagnosis not present

## 2022-08-25 NOTE — Progress Notes (Signed)
Subjective:    Patient ID: Thomas Mcneil, male    DOB: 1951/05/16, 72 y.o.   MRN: [id number]  HPI  Thomas Mcneil is a 72 y.o. year old male  who  has a past medical history of Arthritis, Complication of anesthesia, Depression, GERD (gastroesophageal reflux disease), Hepatitis, High blood pressure, High cholesterol, Neuropathy, and Peripheral neuropathy.   They are presenting to PM&R clinic as a new patient for pain management evaluation. They were referred  for treatment of chronic pain.  Patient reports he has had worsening pain in his legs and hands for more than 20 years.  He also has has issues with essential tremors.  He is currently on Lyrica to 25 mg twice daily, Cymbalta 60 mg twice daily and was recently started on nortriptyline 10 mg at bedtime.  Patient reports medications help his pain however he does not feel like the pain is under control.  He uses a walker today. Pain is worse in his legs than his hands.  Pain in his legs is worse below his knees and in his hands mostly at the fingertips.  Patient reports altered balance however denies recent falls related to this.     Red flag symptoms: Patient denies saddle anesthesia, loss of bowel or bladder continence, new weakness, new numbness/tingling, or pain waking up at nighttime.   Medications tried: Gabapentin-used previously but this was changed to Lyrica after it was no longer controlling his pain Lyrica and Cymbalta-he thinks these are providing benefit to his pain Nortriptyline 10 mg started in addition to duloxetine and Lyrica-patient has not noted significant improvement Tylenol provides benefit Tramadol makes him feel bad     Interval history 07/11/2022 [name] is here for follow-up of his chronic neuropathy pain.  He continues on Lyrica, Cymbalta, nortriptyline as above.  He reports he ordered a TENS unit however had difficulty using this because of his tremor.  He may return this device if he is unable to operate it himself.   He is not able to have Qutenza completed due to cost.  He continues to have severe limiting pain in his lower extremities.  Interval history 08/25/2022 [name] is here for follow-up of his chronic lower extremity pain.  He continues on Lyrica, Cymbalta and nortriptyline although his pain continues to be poorly controlled.  Nucynta was ordered however not approved by insurance, he provided a letter regarding a possible appeal.  We discussed possibility of spinal cord stimulator however he would like to hold off on this currently.  Pain Inventory  Average Pain 10 Pain Right Now 8 My pain is intermittent, dull, tingling, and aching  In the last 24 hours, has pain interfered with the following? General activity 7 Relation with others 7 Enjoyment of life 7 What TIME of day is your pain at its worst? varies Sleep (in general) Good  Pain is worse with: walking, bending, sitting, standing, and some activites Pain improves with: medication Relief from Meds: 3  Family History  Problem Relation Age of Onset   Hypertension Mother    Diverticulosis Mother    GER disease Mother    Lung disease Sister    Polymyositis Sister    Alcohol abuse Maternal Uncle    Social History   Socioeconomic History   Marital status: Widowed    Spouse name: Not on file   Number of children: Not on file   Years of education: Not on file   Highest education level: Not on file  Occupational History   Not on file  Tobacco Use   Smoking status: Some Days    Packs/day: 0.25    Years: 45.00    Additional pack years: 0.00    Total pack years: 11.25    Types: Cigarettes    Start date: 06/09/2012   Smokeless tobacco: Never   Tobacco comments:    quitting, using e-cig    05/12/2022- smokes on occasion  Substance and Sexual Activity   Alcohol use: Not Currently    Alcohol/week: 70.0 standard drinks of alcohol    Types: 70 Cans of beer per week    Comment: [employer] - currently in [facility] for counseling/rehab.  last drink 06/29/14   Drug use: Not Currently    Frequency: 0.2 times per week    Types: "Crack" cocaine    Comment: "Crack" quit 06/29/14   Sexual activity: Not on file  Other Topics Concern   Not on file  Social History Narrative   Are you right handed or left handed? Right Handed    Are you currently employed ? No    What is your current occupation?   Do you live at home alone? No    Who lives with you? Lives with mother    What type of home do you live in: 1 story or 2 story? Lives in a one story home        Social Determinants of Health   Financial Resource Strain: Not on file  Food Insecurity: Not on file  Transportation Needs: Not on file  Physical Activity: Not on file  Stress: Not on file  Social Connections: Not on file   Past Surgical History:  Procedure Laterality Date   DIRECT LARYNGOSCOPY  12/26/2011   Procedure: DIRECT LARYNGOSCOPY;  Surgeon: Ruby Cola, MD;  Location: [place];  Service: ENT;  Laterality: N/A;  Directy Laryngoscopy with biopsy [id number]   DIRECT LARYNGOSCOPY N/A 10/21/2012   Procedure: DIRECT LARYNGOSCOPY;  Surgeon: Ruby Cola, MD;  Location: [place];  Service: ENT;  Laterality: N/A;   ESOPHAGOGASTRODUODENOSCOPY N/A 10/25/2012   Procedure: ESOPHAGOGASTRODUODENOSCOPY (EGD);  Surgeon: Juanita Craver, MD;  Location: [facility] ENDOSCOPY;  Service: Endoscopy;  Laterality: N/A;   TONSILLECTOMY     Past Surgical History:  Procedure Laterality Date   DIRECT LARYNGOSCOPY  12/26/2011   Procedure: DIRECT LARYNGOSCOPY;  Surgeon: Ruby Cola, MD;  Location: [place];  Service: ENT;  Laterality: N/A;  Directy Laryngoscopy with biopsy [id number]   DIRECT LARYNGOSCOPY N/A 10/21/2012   Procedure: DIRECT LARYNGOSCOPY;  Surgeon: Ruby Cola, MD;  Location: [facility] OR;  Service: ENT;  Laterality: N/A;   ESOPHAGOGASTRODUODENOSCOPY N/A 10/25/2012   Procedure: ESOPHAGOGASTRODUODENOSCOPY (EGD);  Surgeon: Juanita Craver, MD;  Location: [facility] ENDOSCOPY;  Service: Endoscopy;  Laterality: N/A;    TONSILLECTOMY     Past Medical History:  Diagnosis Date   Arthritis    Complication of anesthesia    slow to wake up after last surgery   Depression    GERD (gastroesophageal reflux disease)    Hepatitis    hep c   High blood pressure    High cholesterol    Neuropathy    Peripheral neuropathy    feet and hands   BP (!) 157/98   Pulse 88   Ht 6\' 1"  (1.854 m)   Wt 206 lb 6.4 oz (93.6 kg)   SpO2 96%   BMI 27.23 kg/m   Opioid Risk Score:   Fall Risk Score:  `1  Depression screen [facility] 2/9  06/13/2022    1:11 PM 12/20/2015    5:20 PM 08/09/2014    2:57 PM 04/05/2014    8:25 AM 01/04/2014   10:03 AM  Depression screen PHQ 2/9  Decreased Interest 3  0 0 0  Down, Depressed, Hopeless 2  1 0 0  PHQ - 2 Score 5  1 0 0  Altered sleeping 0      Tired, decreased energy 3      Change in appetite 0      Feeling bad or failure about yourself  1      Trouble concentrating 0      Moving slowly or fidgety/restless 0      Suicidal thoughts 0      PHQ-9 Score 9      Difficult doing work/chores Somewhat difficult         Information is confidential and restricted. Go to Review Flowsheets to unlock data.     Review of Systems  Musculoskeletal:        Bilateral leg pain  Neurological:  Positive for tremors and weakness. Negative for numbness.  All other systems reviewed and are negative.     Objective:   Physical Exam   Gen: no distress, normal appearing HEENT: oral mucosa pink and moist, NCAT Cardio: Reg rate Chest: normal effort, normal rate of breathing Abd: soft, non-distended Ext: no edema Psych: Very pleasant Skin: intact Neuro: Cranial nerves II through XII grossly intact, alert and awake, normal language and speech.  Follows commands. Tremors in bilateral hands and arms Sensation to light touch decreased in bilateral lower extremities below the mid thighs in a stocking glove distribution.  Altered sensation in all fingertips. 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 -Discussed Qutenza, consider if financial situation changes at a later time -Patient attempted to use TENS unit, Zynax brand however has difficulty using it due to his tremor -Reports tramadol made him feel bad in the past -ORT 0-low risk -UDS, pain agreement last visit - Tapentadol ER 50mg  BID ordered last visit-not approved by insurance -Spinal cord stimulator may be an additional option, discussed this with the patient- pt not interested at this time -Nucynta not approved by insurance, will place appeal with insurance -Consider buprenorphine patch as alternative option   Altered balance due to above -Discussed PT-patient is not interested today, patient reports he has not had any recent falls

## 2022-08-26 DIAGNOSIS — M79661 Pain in right lower leg: Secondary | ICD-10-CM | POA: Diagnosis not present

## 2022-08-26 DIAGNOSIS — M62541 Muscle wasting and atrophy, not elsewhere classified, right hand: Secondary | ICD-10-CM | POA: Diagnosis not present

## 2022-08-27 ENCOUNTER — Telehealth: Payer: Self-pay

## 2022-08-27 NOTE — Telephone Encounter (Signed)
Nucynta denied. Preferred drug may not tried: Belbuca buccal film, buprenorphone weekly transdermal patch, morphine Er, Xtampza ER

## 2022-09-04 ENCOUNTER — Telehealth: Payer: Self-pay

## 2022-09-04 NOTE — Telephone Encounter (Signed)
Patient called stating that insurance will pay for Nucynta #14 at a time. He states they will pay #14 4 times then give him the full #60. He needs a prescription sent to the pharmacy for #14 of Nucynta.

## 2022-09-09 DIAGNOSIS — Z79899 Other long term (current) drug therapy: Secondary | ICD-10-CM | POA: Diagnosis not present

## 2022-09-09 MED ORDER — TAPENTADOL HCL ER 50 MG PO TB12: 50.0000 mg | ORAL_TABLET | Freq: Two times a day (BID) | ORAL | 0 refills | Status: DC

## 2022-09-09 NOTE — Telephone Encounter (Signed)
2nd call requesting Nucynta refill.

## 2022-09-10 NOTE — Telephone Encounter (Signed)
Patient notified

## 2022-09-22 ENCOUNTER — Telehealth: Payer: Self-pay

## 2022-09-22 MED ORDER — TAPENTADOL HCL ER 50 MG PO TB12: 50.0000 mg | ORAL_TABLET | Freq: Two times a day (BID) | ORAL | 0 refills | Status: DC

## 2022-09-22 NOTE — Telephone Encounter (Signed)
Patient called in requesting refill for nucynta per pmp last fill  09/10/2022 09/09/2022 2  Nucynta Er 50 Mg Tablet 14.00 7 Corie Chiquito [id number] [place] ([id number]) 0/0 40.00 MME Medicare [place]

## 2022-10-03 ENCOUNTER — Other Ambulatory Visit: Payer: Self-pay | Admitting: *Deleted

## 2022-10-03 MED ORDER — TAPENTADOL HCL ER 50 MG PO TB12: 50.0000 mg | ORAL_TABLET | Freq: Two times a day (BID) | ORAL | 0 refills | Status: DC

## 2022-10-03 NOTE — Telephone Encounter (Signed)
Refill ordered thanks

## 2022-10-03 NOTE — Telephone Encounter (Signed)
Pt.notified

## 2022-10-06 ENCOUNTER — Encounter: Payer: Medicare HMO | Admitting: Physical Medicine & Rehabilitation

## 2022-10-13 ENCOUNTER — Other Ambulatory Visit: Payer: Self-pay

## 2022-10-13 NOTE — Telephone Encounter (Signed)
Filled  Written  ID  Drug  QTY  Days  Prescriber  RX #  Dispenser  Refill  Daily Dose*  Pymt Type  PMP  10/03/2022 10/03/2022 2  Nucynta Er 50 Mg Tablet 14.00 7 Corie Chiquito [id number] [place] ([id number]) 0/0 40.00 MME Medicare [place] 09/22/2022 09/22/2022 2  Nucynta Er 50 Mg Tablet 14.00 7 Corie Chiquito [id number] Nor ([id number]) 0/0 40.00 MME Medicare [place]  Patient is down to one tablet. His insurance will only give a 7 day supple  for now.

## 2022-10-14 MED ORDER — TAPENTADOL HCL ER 50 MG PO TB12: 50.0000 mg | ORAL_TABLET | Freq: Two times a day (BID) | ORAL | 0 refills | Status: DC

## 2022-10-14 NOTE — Telephone Encounter (Signed)
Patient called back today. Asking for Nucynta to sent. He is currently out of the medication.

## 2022-10-15 ENCOUNTER — Telehealth: Payer: Self-pay

## 2022-10-15 NOTE — Telephone Encounter (Signed)
LVM Nucyntal sent to pharmacy.

## 2022-10-15 NOTE — Telephone Encounter (Signed)
No. A PA was not done, this information was sent from the pharmacy.

## 2022-10-15 NOTE — Telephone Encounter (Signed)
Nucynta ER 50 mg is no longer on formulary.  Suggested Alternatives: Morphine Sulfate ER 30 mg tablet, Xtampza ER 9 mg  capsule, Belbuca 75 mcg film, Buprenorphine 5 mcg/HR patch

## 2022-10-17 ENCOUNTER — Telehealth: Payer: Self-pay | Admitting: *Deleted

## 2022-10-17 ENCOUNTER — Encounter: Payer: Medicare HMO | Admitting: Physical Medicine & Rehabilitation

## 2022-10-17 NOTE — Telephone Encounter (Signed)
Urine drug screen for this encounter is consistent for prescribed medication 

## 2022-10-29 NOTE — Telephone Encounter (Signed)
PA submitted for Nucynta 

## 2022-10-29 NOTE — Telephone Encounter (Signed)
PA Case: [id number], Status: Approved, Coverage Starts on: 06/09/2022 12:00:00 AM, Coverage Ends on: 06/09/2023 12:00:00 AM.

## 2022-10-30 ENCOUNTER — Encounter: Payer: Medicare HMO | Attending: Physical Medicine & Rehabilitation | Admitting: Physical Medicine & Rehabilitation

## 2022-10-30 ENCOUNTER — Encounter: Payer: Self-pay | Admitting: Physical Medicine & Rehabilitation

## 2022-10-30 VITALS — BP 141/88 | HR 90 | Ht 73.0 in | Wt 200.4 lb

## 2022-10-30 DIAGNOSIS — G629 Polyneuropathy, unspecified: Secondary | ICD-10-CM

## 2022-10-30 DIAGNOSIS — R2689 Other abnormalities of gait and mobility: Secondary | ICD-10-CM

## 2022-10-30 DIAGNOSIS — Z79891 Long term (current) use of opiate analgesic: Secondary | ICD-10-CM | POA: Diagnosis present

## 2022-10-30 MED ORDER — TAPENTADOL HCL ER 50 MG PO TB12: 50.0000 mg | ORAL_TABLET | Freq: Two times a day (BID) | ORAL | 0 refills | Status: DC

## 2022-10-30 NOTE — Progress Notes (Signed)
Subjective:    Patient ID: Thomas Mcneil, male    DOB: June 27, 1950, 72 y.o.   MRN: [id number]  HPI  Thomas Mcneil is a 72 y.o. year old male  who  has a past medical history of Arthritis, Complication of anesthesia, Depression, GERD (gastroesophageal reflux disease), Hepatitis, High blood pressure, High cholesterol, Neuropathy, and Peripheral neuropathy.   They are presenting to PM&R clinic as a new patient for pain management evaluation. They were referred  for treatment of chronic pain.  Patient reports he has had worsening pain in his legs and hands for more than 20 years.  He also has has issues with essential tremors.  He is currently on Lyrica to 25 mg twice daily, Cymbalta 60 mg twice daily and was recently started on nortriptyline 10 mg at bedtime.  Patient reports medications help his pain however he does not feel like the pain is under control.  He uses a walker today. Pain is worse in his legs than his hands.  Pain in his legs is worse below his knees and in his hands mostly at the fingertips.  Patient reports altered balance however denies recent falls related to this.     Red flag symptoms: Patient denies saddle anesthesia, loss of bowel or bladder continence, new weakness, new numbness/tingling, or pain waking up at nighttime.   Medications tried: Gabapentin-used previously but this was changed to Lyrica after it was no longer controlling his pain Lyrica and Cymbalta-he thinks these are providing benefit to his pain Nortriptyline 10 mg started in addition to duloxetine and Lyrica-patient has not noted significant improvement Tylenol provides benefit Tramadol makes him feel bad     Interval history 07/11/2022 Thomas Mcneil is here for follow-up of his chronic neuropathy pain.  He continues on Lyrica, Cymbalta, nortriptyline as above.  He reports he ordered a TENS unit however had difficulty using this because of his tremor.  He may return this device if he is unable to operate it himself.   He is not able to have Qutenza completed due to cost.  He continues to have severe limiting pain in his lower extremities.   Interval history 08/25/2022 Thomas Mcneil is here for follow-up of his chronic lower extremity pain.  He continues on Lyrica, Cymbalta and nortriptyline although his pain continues to be poorly controlled.  Nucynta was ordered however not approved by insurance, he provided a letter regarding a possible appeal.  We discussed possibility of spinal cord stimulator however he would like to hold off on this currently.   Interval history 10/30/22 Thomas Mcneil is here for follow-up regarding his polyneuropathy pain.  He continues to use Cymbalta, nortriptyline, Lyrica with improvement in his pain however it is not currently well-controlled.  He was doing well on Nucynta for about a month until medicine was delayed due to insurance approval issues.  Patient reports for the few weeks he was off the medicine his pain was very severe.  When he was taking the medicine pain was much better controlled and he did not have to think about the pain all the time.  He continues to be active and tries to mow his lawn and do things around the house.  He also takes care of his elderly mother.  He did not have any side effects with this medication.   Pain Inventory Average Pain 8 Pain Right Now 8 My pain is constant and dull  In the last 24 hours, has pain interfered with the following? General  activity 8 Relation with others 8 Enjoyment of life 8 What TIME of day is your pain at its worst? varies Sleep (in general) Good  Pain is worse with: walking, sitting, inactivity, standing, and some activites Pain improves with: medication and injections Relief from Meds: 5  Family History  Problem Relation Age of Onset   Hypertension Mother    Diverticulosis Mother    GER disease Mother    Lung disease Sister    Polymyositis Sister    Alcohol abuse Maternal Uncle    Social History   Socioeconomic  History   Marital status: Widowed    Spouse name: Not on file   Number of children: Not on file   Years of education: Not on file   Highest education level: Not on file  Occupational History   Not on file  Tobacco Use   Smoking status: Some Days    Packs/day: 0.25    Years: 45.00    Additional pack years: 0.00    Total pack years: 11.25    Types: Cigarettes    Start date: 06/09/2012   Smokeless tobacco: Never   Tobacco comments:    quitting, using e-cig    05/12/2022- smokes on occasion  Substance and Sexual Activity   Alcohol use: Not Currently    Alcohol/week: 70.0 standard drinks of alcohol    Types: 70 Cans of beer per week    Comment: Relaspe - currently in [facility] for counseling/rehab. last drink 06/29/14   Drug use: Not Currently    Frequency: 0.2 times per week    Types: "Crack" cocaine    Comment: "Crack" quit 06/29/14   Sexual activity: Not on file  Other Topics Concern   Not on file  Social History Narrative   Are you right handed or left handed? Right Handed    Are you currently employed ? No    What is your current occupation?   Do you live at home alone? No    Who lives with you? Lives with mother    What type of home do you live in: 1 story or 2 story? Lives in a one story home        Social Determinants of Health   Financial Resource Strain: Not on file  Food Insecurity: Not on file  Transportation Needs: Not on file  Physical Activity: Not on file  Stress: Not on file  Social Connections: Not on file   Past Surgical History:  Procedure Laterality Date   DIRECT LARYNGOSCOPY  12/26/2011   Procedure: DIRECT LARYNGOSCOPY;  Surgeon: Melvenia Beam, MD;  Location: [facility] OR;  Service: ENT;  Laterality: N/A;  Directy Laryngoscopy with biopsy [id number]   DIRECT LARYNGOSCOPY N/A 10/21/2012   Procedure: DIRECT LARYNGOSCOPY;  Surgeon: Melvenia Beam, MD;  Location: [facility] OR;  Service: ENT;  Laterality: N/A;   ESOPHAGOGASTRODUODENOSCOPY N/A 10/25/2012   Procedure:  ESOPHAGOGASTRODUODENOSCOPY (EGD);  Surgeon: Charna Elizabeth, MD;  Location: [facility] ENDOSCOPY;  Service: Endoscopy;  Laterality: N/A;   TONSILLECTOMY     Past Surgical History:  Procedure Laterality Date   DIRECT LARYNGOSCOPY  12/26/2011   Procedure: DIRECT LARYNGOSCOPY;  Surgeon: Melvenia Beam, MD;  Location: [facility] OR;  Service: ENT;  Laterality: N/A;  Directy Laryngoscopy with biopsy [id number]   DIRECT LARYNGOSCOPY N/A 10/21/2012   Procedure: DIRECT LARYNGOSCOPY;  Surgeon: Melvenia Beam, MD;  Location: [facility] OR;  Service: ENT;  Laterality: N/A;   ESOPHAGOGASTRODUODENOSCOPY N/A 10/25/2012   Procedure: ESOPHAGOGASTRODUODENOSCOPY (EGD);  Surgeon: Charna Elizabeth, MD;  Location:  [facility] ENDOSCOPY;  Service: Endoscopy;  Laterality: N/A;   TONSILLECTOMY     Past Medical History:  Diagnosis Date   Arthritis    Complication of anesthesia    slow to wake up after last surgery   Depression    GERD (gastroesophageal reflux disease)    Hepatitis    hep c   High blood pressure    High cholesterol    Neuropathy    Peripheral neuropathy    feet and hands   BP (!) 141/88   Pulse 90   Ht 6\' 1"  (1.854 m)   Wt 200 lb 6.4 oz (90.9 kg)   SpO2 95%   BMI 26.44 kg/m   Opioid Risk Score:   Fall Risk Score:  `1  Depression screen PHQ 2/9     06/13/2022    1:11 PM 12/20/2015    5:20 PM 08/09/2014    2:57 PM 04/05/2014    8:25 AM 01/04/2014   10:03 AM  Depression screen PHQ 2/9  Decreased Interest 3  0 0 0  Down, Depressed, Hopeless 2  1 0 0  PHQ - 2 Score 5  1 0 0  Altered sleeping 0      Tired, decreased energy 3      Change in appetite 0      Feeling bad or failure about yourself  1      Trouble concentrating 0      Moving slowly or fidgety/restless 0      Suicidal thoughts 0      PHQ-9 Score 9      Difficult doing work/chores Somewhat difficult         Information is confidential and restricted. Go to Review Flowsheets to unlock data.    Review of Systems  Musculoskeletal:        Bilateral lower leg pain   All other systems reviewed and are negative.     Objective:   Physical Exam   Gen: no distress, normal appearing HEENT: oral mucosa pink and moist, NCAT Cardio: Reg rate Chest: normal effort, normal rate of breathing Abd: soft, non-distended Ext: no edema Psych: Very pleasant Skin: intact Neuro: Cranial nerves II through XII grossly intact, alert and awake, normal language and speech.  Follows commands. Tremors in bilateral hands and arms Sensation to light touch decreased in bilateral lower extremities below the mid thighs in a stocking glove distribution-unchanged.  Altered sensation in all fingertips. 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 -Discussed Qutenza, consider if financial situation changes at a later time -Patient attempted to use TENS unit, Zynax brand however has difficulty using it due to his tremor -Reports tramadol made him feel bad in the past -UDS, pain agreement completed prior visit -Tapentadol ER 50mg  BID - now approved by insurance- Reordered 1 month supply, hopefully we can get this medication more consistently since it has been helping -Spinal cord stimulator may be an additional option, discussed this with the patient- pt not interested at this time -Consider buprenorphine patch as alternative option -Foods for pain list -Discussed food options that are helpful for pain   Altered balance due to above -Discussed PT-patient is not interested  today due to time limitations taking care of family member, reports he will do home exercises, continue to use walker. He denies recent falls.  -Discussed home exercise program.  Advised  trying tai chi

## 2023-01-01 ENCOUNTER — Encounter: Payer: Self-pay | Admitting: Physical Medicine & Rehabilitation

## 2023-01-01 ENCOUNTER — Encounter: Payer: Medicare HMO | Attending: Physical Medicine & Rehabilitation | Admitting: Physical Medicine & Rehabilitation

## 2023-01-01 VITALS — BP 133/83 | HR 82 | Ht 73.0 in | Wt 201.0 lb

## 2023-01-01 DIAGNOSIS — G894 Chronic pain syndrome: Secondary | ICD-10-CM | POA: Insufficient documentation

## 2023-01-01 DIAGNOSIS — Z5181 Encounter for therapeutic drug level monitoring: Secondary | ICD-10-CM | POA: Insufficient documentation

## 2023-01-01 DIAGNOSIS — Z79891 Long term (current) use of opiate analgesic: Secondary | ICD-10-CM | POA: Diagnosis present

## 2023-01-01 DIAGNOSIS — R2689 Other abnormalities of gait and mobility: Secondary | ICD-10-CM | POA: Insufficient documentation

## 2023-01-01 DIAGNOSIS — G629 Polyneuropathy, unspecified: Secondary | ICD-10-CM | POA: Insufficient documentation

## 2023-01-01 MED ORDER — TAPENTADOL HCL ER 50 MG PO TB12: 50.0000 mg | ORAL_TABLET | Freq: Two times a day (BID) | ORAL | 0 refills | Status: DC

## 2023-01-01 NOTE — Progress Notes (Signed)
Subjective:    Patient ID: Thomas Mcneil, male    DOB: 1951-03-27, 72 y.o.   MRN: [id number]  HPI    Thomas Mcneil is a 72 y.o. year old male  who  has a past medical history of Arthritis, Complication of anesthesia, Depression, GERD (gastroesophageal reflux disease), Hepatitis, High blood pressure, High cholesterol, Neuropathy, and Peripheral neuropathy.   They are presenting to PM&R clinic as a new patient for pain management evaluation. They were referred  for treatment of chronic pain.  Patient reports he has had worsening pain in his legs and hands for more than 20 years.  He also has has issues with essential tremors.  He is currently on Lyrica to 25 mg twice daily, Cymbalta 60 mg twice daily and was recently started on nortriptyline 10 mg at bedtime.  Patient reports medications help his pain however he does not feel like the pain is under control.  He uses a walker today. Pain is worse in his legs than his hands.  Pain in his legs is worse below his knees and in his hands mostly at the fingertips.  Patient reports altered balance however denies recent falls related to this.     Red flag symptoms: Patient denies saddle anesthesia, loss of bowel or bladder continence, new weakness, new numbness/tingling, or pain waking up at nighttime.   Medications tried: Gabapentin-used previously but this was changed to Lyrica after it was no longer controlling his pain Lyrica and Cymbalta-he thinks these are providing benefit to his pain Nortriptyline 10 mg started in addition to duloxetine and Lyrica-patient has not noted significant improvement Tylenol provides benefit Tramadol makes him feel bad     Interval history 07/11/2022 Thomas Mcneil is here for follow-up of his chronic neuropathy pain.  He continues on Lyrica, Cymbalta, nortriptyline as above.  He reports he ordered a TENS unit however had difficulty using this because of his tremor.  He may return this device if he is unable to operate it  himself.  He is not able to have Qutenza completed due to cost.  He continues to have severe limiting pain in his lower extremities.   Interval history 08/25/2022 Thomas Mcneil is here for follow-up of his chronic lower extremity pain.  He continues on Lyrica, Cymbalta and nortriptyline although his pain continues to be poorly controlled.  Nucynta was ordered however not approved by insurance, he provided a letter regarding a possible appeal.  We discussed possibility of spinal cord stimulator however he would like to hold off on this currently.   Interval history 10/30/22 Thomas Mcneil is here for follow-up regarding his polyneuropathy pain.  He continues to use Cymbalta, nortriptyline, Lyrica with improvement in his pain however it is not currently well-controlled.  He was doing well on Nucynta for about a month until medicine was delayed due to insurance approval issues.  Patient reports for the few weeks he was off the medicine his pain was very severe.  When he was taking the medicine pain was much better controlled and he did not have to think about the pain all the time.  He continues to be active and tries to mow his lawn and do things around the house.  He also takes care of his elderly mother.  He did not have any side effects with this medication.    Interval history 01/01/23 Thomas Mcneil is here regarding his chronic pain.  Patient reports recently provides good pain control for bilateral polyneuropathy.  He denies  any side effects with this medication.  Last seen to be more active at home.  Patient initially wanted to try different medication because of cost.  Otherwise covered by insurance it continues to be expensive.  Later patient decided he did not want to try new medication because it could have different side effects and because Nucynta is working well for his pain.  He has been out of it for about a month because he was trying to avoid the cost, and his pain has been worse as a  result.    Pain Inventory Average Pain 8 Pain Right Now 8 My pain is constant, dull, and aching  In the last 24 hours, has pain interfered with the following? General activity 5 Relation with others 0 Enjoyment of life 5 What TIME of day is your pain at its worst? morning  Sleep (in general) Poor  Pain is worse with: walking, sitting, standing, and some activites Pain improves with: rest and medication Relief from Meds: 9  Family History  Problem Relation Age of Onset   Hypertension Mother    Diverticulosis Mother    GER disease Mother    Lung disease Sister    Polymyositis Sister    Alcohol abuse Maternal Uncle    Social History   Socioeconomic History   Marital status: Widowed    Spouse name: Not on file   Number of children: Not on file   Years of education: Not on file   Highest education level: Not on file  Occupational History   Not on file  Tobacco Use   Smoking status: Some Days    Current packs/day: 0.25    Average packs/day: 0.2 packs/day for 45.0 years (11.3 ttl pk-yrs)    Types: Cigarettes    Start date: 06/09/2012   Smokeless tobacco: Never   Tobacco comments:    quitting, using e-cig    05/12/2022- smokes on occasion  Substance and Sexual Activity   Alcohol use: Not Currently    Alcohol/week: 70.0 standard drinks of alcohol    Types: 70 Cans of beer per week    Comment: Relaspe - currently in [facility] for counseling/rehab. last drink 06/29/14   Drug use: Not Currently    Frequency: 0.2 times per week    Types: "Crack" cocaine    Comment: "Crack" quit 06/29/14   Sexual activity: Not on file  Other Topics Concern   Not on file  Social History Narrative   Are you right handed or left handed? Right Handed    Are you currently employed ? No    What is your current occupation?   Do you live at home alone? No    Who lives with you? Lives with mother    What type of home do you live in: 1 story or 2 story? Lives in a one story home        Social  Determinants of Health   Financial Resource Strain: Not on file  Food Insecurity: Not on file  Transportation Needs: Not on file  Physical Activity: Not on file  Stress: Not on file  Social Connections: Unknown (10/22/2021)   Received from [facility]   Social Network    Social Network: Not on file   Past Surgical History:  Procedure Laterality Date   DIRECT LARYNGOSCOPY  12/26/2011   Procedure: DIRECT LARYNGOSCOPY;  Surgeon: Melvenia Beam, MD;  Location: [facility] OR;  Service: ENT;  Laterality: N/A;  Directy Laryngoscopy with biopsy [id number]   DIRECT LARYNGOSCOPY  N/A 10/21/2012   Procedure: DIRECT LARYNGOSCOPY;  Surgeon: Melvenia Beam, MD;  Location: [facility] OR;  Service: ENT;  Laterality: N/A;   ESOPHAGOGASTRODUODENOSCOPY N/A 10/25/2012   Procedure: ESOPHAGOGASTRODUODENOSCOPY (EGD);  Surgeon: Charna Elizabeth, MD;  Location: [facility] ENDOSCOPY;  Service: Endoscopy;  Laterality: N/A;   TONSILLECTOMY     Past Surgical History:  Procedure Laterality Date   DIRECT LARYNGOSCOPY  12/26/2011   Procedure: DIRECT LARYNGOSCOPY;  Surgeon: Melvenia Beam, MD;  Location: [facility] OR;  Service: ENT;  Laterality: N/A;  Directy Laryngoscopy with biopsy [id number]   DIRECT LARYNGOSCOPY N/A 10/21/2012   Procedure: DIRECT LARYNGOSCOPY;  Surgeon: Melvenia Beam, MD;  Location: [facility] OR;  Service: ENT;  Laterality: N/A;   ESOPHAGOGASTRODUODENOSCOPY N/A 10/25/2012   Procedure: ESOPHAGOGASTRODUODENOSCOPY (EGD);  Surgeon: Charna Elizabeth, MD;  Location: [facility] ENDOSCOPY;  Service: Endoscopy;  Laterality: N/A;   TONSILLECTOMY     Past Medical History:  Diagnosis Date   Arthritis    Complication of anesthesia    slow to wake up after last surgery   Depression    GERD (gastroesophageal reflux disease)    Hepatitis    hep c   High blood pressure    High cholesterol    Neuropathy    Peripheral neuropathy    feet and hands   BP (!) 145/91   Pulse 82   Ht 6\' 1"  (1.854 m)   Wt 201 lb (91.2 kg)   SpO2 96%   BMI 26.52 kg/m   Opioid Risk Score:    Fall Risk Score:  `1  Depression screen PHQ 2/9     01/01/2023    1:49 PM 06/13/2022    1:11 PM 12/20/2015    5:20 PM 08/09/2014    2:57 PM 04/05/2014    8:25 AM 01/04/2014   10:03 AM  Depression screen PHQ 2/9  Decreased Interest 1 3  0 0 0  Down, Depressed, Hopeless 0 2  1 0 0  PHQ - 2 Score 1 5  1  0 0  Altered sleeping  0      Tired, decreased energy  3      Change in appetite  0      Feeling bad or failure about yourself   1      Trouble concentrating  0      Moving slowly or fidgety/restless  0      Suicidal thoughts  0      PHQ-9 Score  9      Difficult doing work/chores  Somewhat difficult         Information is confidential and restricted. Go to Review Flowsheets to unlock data.      Review of Systems  Musculoskeletal:        B/L legs  All other systems reviewed and are negative.      Objective:   Physical Exam  Gen: no distress, normal appearing HEENT: oral mucosa pink and moist, NCAT Chest: normal effort, normal rate of breathing Abd: soft, non-distended Ext: no edema Psych: Very pleasant Skin: intact Neuro: Cranial nerves II through XII grossly intact, alert and awake, normal language and speech.  Follows commands. Tremors in bilateral hands and arms Sensation to light touch decreased in bilateral lower extremities below the mid thighs in a stocking glove distribution-unchanged.  Altered sensation in all fingertips. No focal motor deficits Musculoskeletal: Atrophy of the hand intrinsic muscles no paraspinal tenderness noted slump test negative Spurling's negative     Assessment & Plan:  Polyneuropathy, per neurology most  likely due to alcohol use -Discussed Qutenza again, consider if financial situation changes at a later time -Patient attempted to use TENS unit, Zynex- hard to use with tremor, Zynex rep provided option to try sleeve  -Reports tramadol made him feel bad in the past -UDS, pain agreement completed prior visit -Tapentadol ER 50mg  BID  -reordered today, advised patient to call about 5 to 7 days before refill as needed.   -Spinal cord stimulator may be an additional option, discussed this with the patient- pt not interested at this time -Consider buprenorphine patch as alternative option if needed -Discussed food options that are helpful for pain -Monitor PDMP -Recheck UDS- no recent use of tapentadol as he has been out of this   Altered balance due to above -Discussed PT-patient is not interested -unchanged -Continue home exercises, continue to use walker. He denies recent falls.  -Discussed home exercise program.  Advised trying tai chi

## 2023-03-12 ENCOUNTER — Encounter: Payer: Medicare HMO | Admitting: Physical Medicine & Rehabilitation

## 2023-09-08 ENCOUNTER — Ambulatory Visit: Admitting: Orthopaedic Surgery

## 2023-09-08 ENCOUNTER — Other Ambulatory Visit (INDEPENDENT_AMBULATORY_CARE_PROVIDER_SITE_OTHER): Payer: Self-pay

## 2023-09-08 DIAGNOSIS — M1611 Unilateral primary osteoarthritis, right hip: Secondary | ICD-10-CM | POA: Insufficient documentation

## 2023-09-08 NOTE — Progress Notes (Signed)
 Office Visit Note   Patient: Thomas Mcneil           Date of Birth: [birth year]           MRN: [id number] Visit Date: 09/08/2023              Requested by: No referring provider defined for this encounter. PCP: Patient, No Pcp Per   Assessment & Plan: Visit Diagnoses:  1. Primary osteoarthritis of right hip     Plan: Thomas Mcneil is a 73 year old gentleman with end-stage right hip DJD.  Based on findings I have recommended a right total hip arthroplasty.  We will need clearance from PCP prior to scheduling surgery.  He currently has an aide who comes to his house for 2 hours a day.  He lives with his mother.  He was on Eliquis for DVT but has completed the course.  I would like to send him to physical therapy for prehab.  Impression is severe right hip degenerative joint disease secondary to Osteoarthritis.  Imaging shows bone on bone joint space narrowing.  At this point, conservative treatments fail to provide any significant relief and the pain is severely affecting ADLs and quality of life.  Based on treatment options, the patient has elected to move forward with a hip replacement.  We have discussed the surgical risks that include but are not limited to infection, DVT, leg length discrepancy, numbness, tingling, incomplete relief of pain.  Recovery and prognosis were also reviewed.    Current anticoagulants: No antithrombotic Postop anticoagulation: Eliquis Diabetic: No  Prior DVT/PE: Yes Tobacco use: Yes Clearances needed for surgery: PCP Anticipate discharge dispo: home  Total face to face encounter time was greater than 45 minutes and over half of this time was spent in counseling and/or coordination of care.   Follow-Up Instructions: No follow-ups on file.   Orders:  Orders Placed This Encounter  Procedures   XR HIP UNILAT W OR W/O PELVIS 2-3 VIEWS RIGHT   Ambulatory referral to Physical Therapy   No orders of the defined types were placed in this encounter.      Procedures: No procedures performed   Clinical Data: No additional findings.   Subjective: Chief Complaint  Patient presents with   Right Hip - Pain    HPI Thomas Mcneil is a 73 year old gentleman who comes in for evaluation of severe right hip pain.  Uses a rollator for ambulation.  Reports that his hip pain has gotten severe.  He reports no range of motion in the hip. Review of Systems   Objective: Vital Signs: There were no vitals taken for this visit.  Physical Exam  Ortho Exam Examination of the right hip shows very limited range of motion.  He has extreme pain with any movement of the hip joint.  Right leg is slightly shorter than the left. Specialty Comments:  No specialty comments available.  Imaging: XR HIP UNILAT W OR W/O PELVIS 2-3 VIEWS RIGHT Result Date: 09/08/2023 X-rays of the right hip show advanced degenerative joint disease with bone-on-bone joint space narrowing.    PMFS History: Patient Active Problem List   Diagnosis Date Noted   Primary osteoarthritis of right hip 09/08/2023   Cocaine dependence, binge pattern (HCC) 11/26/2015   Substance induced mood disorder (HCC) 11/26/2015   Intention tremor 11/26/2015   Alcohol use disorder, severe, dependence (HCC) 09/04/2015   Severe recurrent major depression without psychotic features (HCC) 09/04/2015   Alcohol dependence, uncomplicated (HCC) 09/01/2015   Alcohol-induced mood  disorder (HCC) 09/01/2015   Cocaine abuse (HCC) 09/01/2015   Alcohol abuse 04/05/2014   Essential tremor 01/04/2014   Other and unspecified hyperlipidemia 01/04/2014   B12 deficiency 01/04/2014   Melena 10/23/2012   GERD 05/27/2010   SHOULDER PAIN, LEFT 05/21/2010   ONYCHOMYCOSIS, TOENAILS 05/09/2010   KNEE PAIN, BILATERAL 06/27/2009   LEG CRAMPS 06/27/2009   Contusion 08/18/2008   LEG EDEMA, BILATERAL 01/13/2008   ERECTILE DYSFUNCTION 10/15/2007   TOBACCO ABUSE 08/16/2007   Mononeuritis of lower limb 08/16/2007   CORNS AND  CALLUSES 08/16/2007   ALCOHOL ABUSE, HX OF 08/16/2007   Past Medical History:  Diagnosis Date   Arthritis    Complication of anesthesia    slow to wake up after last surgery   Depression    GERD (gastroesophageal reflux disease)    Hepatitis    hep c   High blood pressure    High cholesterol    Neuropathy    Peripheral neuropathy    feet and hands    Family History  Problem Relation Age of Onset   Hypertension Mother    Diverticulosis Mother    GER disease Mother    Lung disease Sister    Polymyositis Sister    Alcohol abuse Maternal Uncle     Past Surgical History:  Procedure Laterality Date   DIRECT LARYNGOSCOPY  12/26/2011   Procedure: DIRECT LARYNGOSCOPY;  Surgeon: Melvenia Beam, MD;  Location: [facility] OR;  Service: ENT;  Laterality: N/A;  Directy Laryngoscopy with biopsy [id number]   DIRECT LARYNGOSCOPY N/A 10/21/2012   Procedure: DIRECT LARYNGOSCOPY;  Surgeon: Melvenia Beam, MD;  Location: [facility] OR;  Service: ENT;  Laterality: N/A;   ESOPHAGOGASTRODUODENOSCOPY N/A 10/25/2012   Procedure: ESOPHAGOGASTRODUODENOSCOPY (EGD);  Surgeon: Charna Elizabeth, MD;  Location: [facility] ENDOSCOPY;  Service: Endoscopy;  Laterality: N/A;   TONSILLECTOMY     Social History   Occupational History   Not on file  Tobacco Use   Smoking status: Some Days    Current packs/day: 0.25    Average packs/day: 0.3 packs/day for 45.6 years (11.4 ttl pk-yrs)    Types: Cigarettes    Start date: 06/09/2012   Smokeless tobacco: Never   Tobacco comments:    quitting, using e-cig    05/12/2022- smokes on occasion  Substance and Sexual Activity   Alcohol use: Not Currently    Alcohol/week: 70.0 standard drinks of alcohol    Types: 70 Cans of beer per week    Comment: [place] - currently in [facility] for counseling/rehab. last drink 06/29/14   Drug use: Not Currently    Frequency: 0.2 times per week    Types: "Crack" cocaine    Comment: "Crack" quit 06/29/14   Sexual activity: Not on file

## 2023-09-29 ENCOUNTER — Encounter: Payer: Self-pay | Admitting: Physical Therapy

## 2023-09-29 ENCOUNTER — Ambulatory Visit: Admitting: Physical Therapy

## 2023-09-29 DIAGNOSIS — M25551 Pain in right hip: Secondary | ICD-10-CM | POA: Diagnosis not present

## 2023-09-29 DIAGNOSIS — M6281 Muscle weakness (generalized): Secondary | ICD-10-CM | POA: Diagnosis not present

## 2023-09-29 DIAGNOSIS — R262 Difficulty in walking, not elsewhere classified: Secondary | ICD-10-CM | POA: Diagnosis not present

## 2023-09-29 NOTE — Therapy (Addendum)
 OUTPATIENT PHYSICAL THERAPY LOWER EXTREMITY EVALUATION  / DISCHARGE   Patient Name: Thomas Mcneil MRN: [id number] DOB:1950/11/04, 73 y.o., male Today's Date: 09/29/2023  END OF SESSION:  PT End of Session - 09/29/23 1435     Visit Number 1    Number of Visits 3    Date for PT Re-Evaluation 10/16/23    PT Start Time 1432    PT Stop Time 1513    PT Time Calculation (min) 41 min    Activity Tolerance Patient tolerated treatment well    Behavior During Therapy WFL for tasks assessed/performed             Past Medical History:  Diagnosis Date   Arthritis    Complication of anesthesia    slow to wake up after last surgery   Depression    GERD (gastroesophageal reflux disease)    Hepatitis    hep c   High blood pressure    High cholesterol    Neuropathy    Peripheral neuropathy    feet and hands   Past Surgical History:  Procedure Laterality Date   DIRECT LARYNGOSCOPY  12/26/2011   Procedure: DIRECT LARYNGOSCOPY;  Surgeon: Merilee Kraft, MD;  Location: [facility] OR;  Service: ENT;  Laterality: N/A;  Directy Laryngoscopy with biopsy [id number]   DIRECT LARYNGOSCOPY N/A 10/21/2012   Procedure: DIRECT LARYNGOSCOPY;  Surgeon: Merilee Kraft, MD;  Location: [facility] OR;  Service: ENT;  Laterality: N/A;   ESOPHAGOGASTRODUODENOSCOPY N/A 10/25/2012   Procedure: ESOPHAGOGASTRODUODENOSCOPY (EGD);  Surgeon: Renaye Sous, MD;  Location[facility] END[facility]COPY;  Service: Endoscopy;  Laterality: N/A;   TONSILLECTOMY     Patient Active Problem List   Diagnosis Date Noted   Primary osteoarthritis of right hip 09/08/2023   Cocaine dependence, binge pattern (HCC) 11/26/2015   Substance induced mood disorder (HCC) 11/26/2015   Intention tremor 11/26/2015   Alcohol  use disorder, severe, dependence (HCC) 09/04/2015   Severe recurrent major depression without psychotic features (HCC) 09/04/2015   Alcohol  dependence, uncomplicated (HCC) 09/01/2015   Alcohol -induced mood disorder (HCC) 09/01/2015   Cocaine abuse (HCC)  09/01/2015   Alcohol  abuse 04/05/2014   Essential tremor 01/04/2014   Other and unspecified hyperlipidemia 01/04/2014   B12 deficiency 01/04/2014   Melena 10/23/2012   GERD 05/27/2010   SHOULDER PAIN, LEFT 05/21/2010   ONYCHOMYCOSIS, TOENAILS 05/09/2010   KNEE PAIN, BILATERAL 06/27/2009   LEG CRAMPS 06/27/2009   Contusion 08/18/2008   LEG EDEMA, BILATERAL 01/13/2008   ERECTILE DYSFUNCTION 10/15/2007   TOBACCO ABUSE 08/16/2007   Mononeuritis of lower limb 08/16/2007   CORNS AND CALLUSES 08/16/2007   ALCOHOL  ABUSE, HX OF 08/16/2007    PCP: Sigrid Deidra Fox, MD    REFERRING PROVIDER: Jerri Kay HERO, MD   REFERRING DIAG:  Diagnosis  M16.11 (ICD-10-CM) - Primary osteoarthritis of right hip    THERAPY DIAG:  Pain in right hip  Muscle weakness (generalized)  Difficulty in walking, not elsewhere classified  Rationale for Evaluation and Treatment: Rehabilitation  ONSET DATE: ongoing for years  SUBJECTIVE:    SUBJECTIVE STATEMENT: 73 year old gentleman with end-stage right hip DJD. Pt scheduled for right total hip arthroplasty on 10/19/23.   PERTINENT HISTORY: See PMH above Pt lives with his mother and has an aide 2 hours each day.    PAIN:  NPRS scale: no pain at rest, worst pain reaches 9/10 Pain location: Rt hip  Pain description: achy, sharp at times Aggravating factors: turning the wrong way Relieving factors: sitting, tylenol   PRECAUTIONS: None  WEIGHT BEARING RESTRICTIONS: No  FALLS:  Has patient fallen in last 6 months? No  LIVING ENVIRONMENT: Lives with: lives with their family and , pt helps to take care of his mother who is 18, pt has an aid to help come give his mom meds and help dress her Lives in: House/apartment Stairs: No Has following equipment at home: Ramped entry  OCCUPATION: retired  PLOF: Independent  PATIENT GOALS: walk without   Next MD visit:   OBJECTIVE:   DIAGNOSTIC FINDINGS:  X-rays of the right hip show  advanced degenerative joint disease with  bone-on-bone joint space narrowing.   PATIENT SURVEYS:  Patient-Specific Activity Scoring Scheme  0 represents "unable to perform." 10 represents "able to perform at prior level. 0 1 2 3 4 5 6 7 8 9  10 (Date and Score)   Activity Eval  09/29/23    1. Stairs  1     2. Getting in bed 2     3. Rolling over in bed 1   4.walking 6   5.standing 5   Score 15/5= 3    Total score = sum of the activity scores/number of activities Minimum detectable change (90%CI) for average score = 2 points Minimum detectable change (90%CI) for single activity score = 3 points  COGNITION: Overall cognitive status: [facility]    LOWER EXTREMITY MMT:  MMT Right Eval 09/29/23 Left Eval 09/29/23  Hip flexion 1/5 MMT 20.1 ppsi  Hip extension    Hip abduction    Hip adduction    Hip internal rotation    Hip external rotation    Knee flexion 22.5 ppsi 26.5 ppsi  Knee extension 2/5 MMT 22.8 ppsi  Ankle dorsiflexion    Ankle plantarflexion    Ankle inversion    Ankle eversion     (Blank rows = not tested)  LOWER EXTREMITY ROM:  ROM Right 09/29/23 Supine active Left 09/29/23 Supine active  Hip flexion A: 55  P: 70   Hip extension    Hip abduction    Hip adduction    Hip internal rotation    Hip external rotation    Knee flexion A: 60 P: 74 A: 98 P: 110  Knee extension A: 0 A: -8 P: -5  Ankle dorsiflexion    Ankle plantarflexion    Ankle inversion    Ankle eversion     (Blank rows = not tested)   FUNCTIONAL TESTS:  09/29/23: 1 time sit to stand: 20.3 seconds TUG: 37.8 seconds c rollator walker using UE support  GAIT: Distance walked: clinic distances level ground Assistive device utilized: Environmental consultant - 4 wheeled Level of assistance: SBA Comments: antalgic gait, tremors in bil UEs and LEs, step through gait pattern using a 4 wheeled walker  TODAY'S TREATMENT                                                                          DATE: 09/29/23:  Therex: HEP instruction/performance c cues for techniques, handout provided.  Trial set performed of each for comprehension and symptom assessment.  See below for exercise list Nustep: level 4 x 5 minutes Self Care:  Bed mobility education  PATIENT EDUCATION:  Education details: HEP, POC Person educated: Patient Education method: Explanation, Demonstration, Verbal cues, and Handouts Education comprehension: verbalized understanding, returned demonstration, and verbal cues required  HOME EXERCISE PROGRAM: Access Code: [email or web address] URL: [email or web address] Date: 09/29/2023 Prepared by: Delon Lunger  Exercises - Sit to Stand with Counter Support  - 2 x daily - 7 x weekly - 10 reps - Seated Hip Abduction with Resistance  - 2 x daily - 7 x weekly - 2 sets - 10 reps - Seated Hip Adduction Isometrics with Ball  - 2 x daily - 7 x weekly - 2 sets - 10 reps - 5 seconds hold - Seated Heel Slide  - 2 x daily - 7 x weekly - 2 sets - 10 reps  ASSESSMENT:  CLINICAL IMPRESSION: Patient is a 73 y.o. who comes to clinic with complaints of Rt hip pain pt scheduled for Rt hip arthroplasty on 10/19/23. Pt presents with mobility, strength and movement coordination deficits that impair their ability to perform usual daily and recreational functional activities without increase difficulty/symptoms at this time.  Patient to benefit from skilled PT services to address impairments and limitations to improve to previous level of function without restriction secondary to condition.   OBJECTIVE IMPAIRMENTS: decreased activity tolerance, decreased balance, decreased mobility, difficulty walking, decreased ROM, decreased strength, and pain.   ACTIVITY LIMITATIONS: bending, sitting, standing, stairs, transfers, bed mobility, and  hygiene/grooming  PARTICIPATION LIMITATIONS: meal prep and community activity  PERSONAL FACTORS: 3+ comorbidities: see PMH above are also affecting patient's functional outcome.   REHAB POTENTIAL: Fair, due to pt scheduled for hip replacement surgery on 10/19/23  CLINICAL DECISION MAKING: Stable/uncomplicated  EVALUATION COMPLEXITY: Low   GOALS: Goals reviewed with patient? Yes  SHORT TERM GOALS: (target date for Short term goals are 3 weeks 10/18/2023)   1.  Patient will demonstrate independent use of home exercise program to maintain progress from in clinic treatments.  Goal status: New  2. Patient will demonstrate Patient specific functional scale avg > or = 4 to indicate reduced disability due to condition.   Goal status: New         PLAN:  PT FREQUENCY: 1x/ week   PT DURATION: 3  weeks  PLANNED INTERVENTIONS: Can include 02853- PT Re-evaluation, 97110-Therapeutic exercises, 97530- Therapeutic activity, 97112- Neuromuscular re-education, [phone]- Self Care, 97140- Manual therapy, [phone]- Gait training, [phone]- Orthotic Fit/training, [phone]- Canalith repositioning, [id number]- Aquatic Therapy, [phone]- Electrical stimulation (unattended), [phone]- Electrical stimulation (manual), [id number] Physical performance testing, 97016- Vasopneumatic device, [id number]- Ultrasound, [id number]- Traction (mechanical), [id number]- Ionotophoresis 4mg /ml Dexamethasone , Patient/Family education, Balance training, Stair training, Taping, Dry Needling, Joint mobilization, Joint manipulation, Spinal manipulation, Spinal mobilization, Scar mobilization, Vestibular training, Visual/preceptual remediation/compensation, DME instructions, Cryotherapy, and Moist heat.  All performed as medically necessary.  All included unless contraindicated  PLAN FOR  NEXT SESSION: Review and progress HEP as tolerated  Nustep    Delon JONELLE Lunger, PT, MPT 09/29/2023, 2:37 PM   PHYSICAL THERAPY DISCHARGE SUMMARY  Visits from Start of Care:  1  Current functional level related to goals / functional outcomes: See note   Remaining deficits: See note   Education / Equipment: HEP  Patient goals were not met. Patient is being discharged due to not returning since the last visit.  Ozell Silvan, PT, DPT, OCS, ATC 03/28/24  10:19 AM

## 2023-10-07 NOTE — Progress Notes (Signed)
 Surgical Instructions    Your procedure is scheduled on Oct 19, 2023.  Report to [facility] Main Entrance "A" at 9:45 A.M., then check in with the Admitting office.  Call this number if you have problems the morning of surgery:  [phone]  If you have any questions prior to your surgery date call [phone]: Open Monday-Friday 8am-4pm If you experience any cold or flu symptoms such as cough, fever, chills, shortness of breath, etc. between now and your scheduled surgery, please notify us  at the above number.     Remember:  Do not eat after midnight the night before your surgery  You may drink clear liquids until 9:15 the morning of your surgery.   Clear liquids allowed are: Water, Non-Citrus Juices (without pulp), Carbonated Beverages, Clear Tea, Black Coffee Only (NO MILK, CREAM OR POWDERED CREAMER of any kind), and Gatorade.  Patient Instructions  The night before surgery:  No food after midnight. ONLY clear liquids after midnight  The day of surgery (if you do NOT have diabetes):  Drink ONE (1) Pre-Surgery Clear Ensure by 9:15 the morning of surgery. Drink in one sitting. Do not sip.  This drink was given to you during your hospital  pre-op appointment visit.  Nothing else to drink after completing the  Pre-Surgery Clear Ensure.           If you have questions, please contact your surgeon's office.     Take these medicines the morning of surgery with A SIP OF WATER  fluticasone  (FLONASE )  pantoprazole  (PROTONIX )  pregabalin  (LYRICA )  propranolol  (INDERAL )  tapentadol  (NUCYNTA )     As of today, STOP taking any Aspirin (unless otherwise instructed by your surgeon) Aleve, Naproxen, Ibuprofen , Motrin , Advil , Goody's, BC's, all herbal medications, fish oil, and all vitamins.                     Do NOT Smoke (Tobacco/Vaping) for 24 hours prior to your procedure.  If you use a CPAP at night, you may bring your mask/headgear for your overnight stay.   Contacts,  glasses, piercing's, hearing aid's, dentures or partials may not be worn into surgery, please bring cases for these belongings.    For patients admitted to the hospital, discharge time will be determined by your treatment team.   Patients discharged the day of surgery will not be allowed to drive home, and someone needs to stay with them for 24 hours.  SURGICAL WAITING ROOM VISITATION Patients having surgery or a procedure may have no more than 2 support people in the waiting area - these visitors may rotate.   Children under the age of 73 must have an adult with them who is not the patient. If the patient needs to stay at the hospital during part of their recovery, the visitor guidelines for inpatient rooms apply. Pre-op nurse will coordinate an appropriate time for 1 support person to accompany patient in pre-op.  This support person may not rotate.   Please refer to the [facility] website for the visitor guidelines for Inpatients (after your surgery is over and you are in a regular room).    Special instructions:   [facility]- Preparing For Surgery  Before surgery, you can play an important role. Because skin is not sterile, your skin needs to be as free of germs as possible. You can reduce the number of germs on your skin by washing with CHG (chlorahexidine gluconate) Soap before surgery.  CHG is an antiseptic cleaner which  kills germs and bonds with the skin to continue killing germs even after washing.    Oral Hygiene is also important to reduce your risk of infection.  Remember - BRUSH YOUR TEETH THE MORNING OF SURGERY WITH YOUR REGULAR TOOTHPASTE  Please do not use if you have an allergy to CHG or antibacterial soaps. If your skin becomes reddened/irritated stop using the CHG.  Do not shave (including legs and underarms) for at least 48 hours prior to first CHG shower. It is OK to shave your face.  Please follow these instructions carefully.   Shower the NIGHT BEFORE SURGERY and  the MORNING OF SURGERY  If you chose to wash your hair, wash your hair first as usual with your normal shampoo.  After you shampoo, rinse your hair and body thoroughly to remove the shampoo.  Use CHG Soap as you would any other liquid soap. You can apply CHG directly to the skin and wash gently with a scrungie or a clean washcloth.   Apply the CHG Soap to your body ONLY FROM THE NECK DOWN.  Do not use on open wounds or open sores. Avoid contact with your eyes, ears, mouth and genitals (private parts). Wash Face and genitals (private parts)  with your normal soap.   Wash thoroughly, paying special attention to the area where your surgery will be performed.  Thoroughly rinse your body with warm water from the neck down.  DO NOT shower/wash with your normal soap after using and rinsing off the CHG Soap.  Pat yourself dry with a CLEAN TOWEL.  Wear CLEAN PAJAMAS to bed the night before surgery  Place CLEAN SHEETS on your bed the night before your surgery  DO NOT SLEEP WITH PETS.   Day of Surgery: Take a shower with CHG soap. Do not wear jewelry or makeup Do not wear lotions, powders, perfumes/colognes, or deodorant. Do not shave 48 hours prior to surgery.  Men may shave face and neck. Do not bring valuables to the hospital.  [facility] is not responsible for any belongings or valuables. Do not wear nail polish, gel polish, artificial nails, or any other type of covering on natural nails (fingers and toes) If you have artificial nails or gel coating that need to be removed by a nail salon, please have this removed prior to surgery. Artificial nails or gel coating may interfere with anesthesia's ability to adequately monitor your vital signs. Wear Clean/Comfortable clothing the morning of surgery Remember to brush your teeth WITH YOUR REGULAR TOOTHPASTE.   Please read over the following fact sheets that you were given.    If you received a COVID test during your pre-op visit  it is  requested that you wear a mask when out in public, stay away from anyone that may not be feeling well and notify your surgeon if you develop symptoms. If you have been in contact with anyone that has tested positive in the last 10 days please notify you surgeon.

## 2023-10-08 ENCOUNTER — Other Ambulatory Visit: Payer: Self-pay

## 2023-10-08 ENCOUNTER — Other Ambulatory Visit: Payer: Self-pay | Admitting: Physician Assistant

## 2023-10-08 ENCOUNTER — Encounter (HOSPITAL_COMMUNITY)
Admission: RE | Admit: 2023-10-08 | Discharge: 2023-10-08 | Disposition: A | Discharge status: Home / Self Care | Source: Ambulatory Visit | Attending: Orthopaedic Surgery | Admitting: Orthopaedic Surgery

## 2023-10-08 ENCOUNTER — Encounter (HOSPITAL_COMMUNITY): Payer: Self-pay

## 2023-10-08 VITALS — BP 123/73 | HR 87 | Temp 98.4°F | Resp 18 | Ht 72.0 in | Wt 200.2 lb

## 2023-10-08 DIAGNOSIS — J449 Chronic obstructive pulmonary disease, unspecified: Secondary | ICD-10-CM | POA: Diagnosis not present

## 2023-10-08 DIAGNOSIS — F1491 Cocaine use, unspecified, in remission: Secondary | ICD-10-CM | POA: Diagnosis not present

## 2023-10-08 DIAGNOSIS — G629 Polyneuropathy, unspecified: Secondary | ICD-10-CM | POA: Diagnosis not present

## 2023-10-08 DIAGNOSIS — D472 Monoclonal gammopathy: Secondary | ICD-10-CM | POA: Diagnosis not present

## 2023-10-08 DIAGNOSIS — M1611 Unilateral primary osteoarthritis, right hip: Secondary | ICD-10-CM | POA: Diagnosis not present

## 2023-10-08 DIAGNOSIS — L89309 Pressure ulcer of unspecified buttock, unspecified stage: Secondary | ICD-10-CM | POA: Diagnosis not present

## 2023-10-08 DIAGNOSIS — Z8619 Personal history of other infectious and parasitic diseases: Secondary | ICD-10-CM | POA: Diagnosis not present

## 2023-10-08 DIAGNOSIS — Z01818 Encounter for other preprocedural examination: Secondary | ICD-10-CM | POA: Diagnosis not present

## 2023-10-08 DIAGNOSIS — I10 Essential (primary) hypertension: Secondary | ICD-10-CM | POA: Insufficient documentation

## 2023-10-08 DIAGNOSIS — E7800 Pure hypercholesterolemia, unspecified: Secondary | ICD-10-CM | POA: Insufficient documentation

## 2023-10-08 DIAGNOSIS — Z01810 Encounter for preprocedural cardiovascular examination: Secondary | ICD-10-CM | POA: Diagnosis present

## 2023-10-08 DIAGNOSIS — Z01812 Encounter for preprocedural laboratory examination: Secondary | ICD-10-CM | POA: Diagnosis present

## 2023-10-08 DIAGNOSIS — F1091 Alcohol use, unspecified, in remission: Secondary | ICD-10-CM | POA: Insufficient documentation

## 2023-10-08 DIAGNOSIS — Z86718 Personal history of other venous thrombosis and embolism: Secondary | ICD-10-CM | POA: Diagnosis not present

## 2023-10-08 DIAGNOSIS — G250 Essential tremor: Secondary | ICD-10-CM | POA: Diagnosis not present

## 2023-10-08 HISTORY — DX: Acute embolism and thrombosis of unspecified deep veins of unspecified lower extremity: I82.409

## 2023-10-08 HISTORY — DX: Chronic obstructive pulmonary disease, unspecified: J44.9

## 2023-10-08 HISTORY — DX: Monoclonal gammopathy: D47.2

## 2023-10-08 LAB — CBC
HCT: 38.4 % — ABNORMAL LOW (ref 39.0–52.0)
Hemoglobin: 12.8 g/dL — ABNORMAL LOW (ref 13.0–17.0)
MCH: 30.3 pg (ref 26.0–34.0)
MCHC: 33.3 g/dL (ref 30.0–36.0)
MCV: 91 fL (ref 80.0–100.0)
Platelets: 235 10*3/uL (ref 150–400)
RBC: 4.22 MIL/uL (ref 4.22–5.81)
RDW: 13.3 % (ref 11.5–15.5)
WBC: 5.1 10*3/uL (ref 4.0–10.5)
nRBC: 0 % (ref 0.0–0.2)

## 2023-10-08 LAB — BASIC METABOLIC PANEL WITH GFR
Anion gap: 10 (ref 5–15)
BUN: 11 mg/dL (ref 8–23)
CO2: 30 mmol/L (ref 22–32)
Calcium: 9.5 mg/dL (ref 8.9–10.3)
Chloride: 102 mmol/L (ref 98–111)
Creatinine, Ser: 1.37 mg/dL — ABNORMAL HIGH (ref 0.61–1.24)
GFR, Estimated: 54 mL/min — ABNORMAL LOW (ref >60)
Glucose, Bld: 91 mg/dL (ref 70–99)
Potassium: 3.3 mmol/L — ABNORMAL LOW (ref 3.5–5.1)
Sodium: 142 mmol/L (ref 135–145)

## 2023-10-08 LAB — SURGICAL PCR SCREEN
MRSA, PCR: NEGATIVE
Staphylococcus aureus: NEGATIVE

## 2023-10-08 LAB — TYPE AND SCREEN
ABO/RH(D): A NEG
Antibody Screen: NEGATIVE

## 2023-10-08 MED ORDER — OXYCODONE-ACETAMINOPHEN 5-325 MG PO TABS: 1.0000 | ORAL_TABLET | Freq: Four times a day (QID) | ORAL | 0 refills | Status: DC | PRN

## 2023-10-08 MED ORDER — ONDANSETRON HCL 4 MG PO TABS: 4.0000 mg | ORAL_TABLET | Freq: Three times a day (TID) | ORAL | 0 refills | Status: DC | PRN

## 2023-10-08 MED ORDER — DOCUSATE SODIUM 100 MG PO CAPS: 100.0000 mg | ORAL_CAPSULE | Freq: Every day | ORAL | 2 refills | Status: DC | PRN

## 2023-10-08 MED ORDER — METHOCARBAMOL 750 MG PO TABS: 750.0000 mg | ORAL_TABLET | Freq: Three times a day (TID) | ORAL | 2 refills | Status: DC | PRN

## 2023-10-08 NOTE — Progress Notes (Addendum)
 Surgical Instructions    Your procedure is scheduled on Oct 19, 2023.  Report to [facility] Main Entrance "A" at 9:45 A.M., then check in with the Admitting office.  Call this number if you have problems the morning of surgery:  [phone]  If you have any questions prior to your surgery date call [phone]: Open Monday-Friday 8am-4pm If you experience any cold or flu symptoms such as cough, fever, chills, shortness of breath, etc. between now and your scheduled surgery, please notify us  at the above number.     Remember:  Do not eat after midnight the night before your surgery  You may drink clear liquids until 9:15 the morning of your surgery.   Clear liquids allowed are: Water, Non-Citrus Juices (without pulp), Carbonated Beverages, Clear Tea, Black Coffee Only (NO MILK, CREAM OR POWDERED CREAMER of any kind), and Gatorade.  Patient Instructions  The night before surgery:  No food after midnight. ONLY clear liquids after midnight  The day of surgery (if you do NOT have diabetes):  Drink ONE (1) Pre-Surgery Clear Ensure by 9:15 the morning of surgery. Drink in one sitting. Do not sip.  This drink was given to you during your hospital  pre-op appointment visit.  Nothing else to drink after completing the  Pre-Surgery Clear Ensure.           If you have questions, please contact your surgeon's office.     Take these medicines the morning of surgery with A SIP OF WATER  amLODipine (NORVASC)  fluticasone  (FLONASE )  pantoprazole  (PROTONIX )  pregabalin  (LYRICA )    If needed: you may take:  acetaminophen  (TYLENOL )   As of today, STOP taking any Aspirin (unless otherwise instructed by your surgeon) Aleve, Naproxen, Ibuprofen , Motrin , Advil , Goody's, BC's, all herbal medications, fish oil, and all vitamins.                     Do NOT Smoke (Tobacco/Vaping) for 24 hours prior to your procedure.  If you use a CPAP at night, you may bring your mask/headgear for your  overnight stay.   Contacts, glasses, piercing's, hearing aid's, dentures or partials may not be worn into surgery, please bring cases for these belongings.    For patients admitted to the hospital, discharge time will be determined by your treatment team.   Patients discharged the day of surgery will not be allowed to drive home, and someone needs to stay with them for 24 hours.  SURGICAL WAITING ROOM VISITATION Patients having surgery or a procedure may have no more than 2 support people in the waiting area - these visitors may rotate.   Children under the age of 2 must have an adult with them who is not the patient. If the patient needs to stay at the hospital during part of their recovery, the visitor guidelines for inpatient rooms apply. Pre-op nurse will coordinate an appropriate time for 1 support person to accompany patient in pre-op.  This support person may not rotate.   Please refer to the [facility] website for the visitor guidelines for Inpatients (after your surgery is over and you are in a regular room).     Pre-operative 5 CHG Bath Instructions   You can play a key role in reducing the risk of infection after surgery. Your skin needs to be as free of germs as possible. You can reduce the number of germs on your skin by washing with CHG (chlorhexidine  gluconate) soap before surgery. CHG is  an antiseptic soap that kills germs and continues to kill germs even after washing.   DO NOT use if you have an allergy to chlorhexidine /CHG or antibacterial soaps. If your skin becomes reddened or irritated, stop using the CHG and notify one of our RNs at [phone].   Please shower with the CHG soap starting 4 days before surgery using the following schedule:     Please keep in mind the following:  DO NOT shave, including legs and underarms, starting the day of your first shower.   You may shave your face at any point before/day of surgery.  Place clean sheets on your bed the day  you start using CHG soap. Use a clean washcloth (not used since being washed) for each shower. DO NOT sleep with pets once you start using the CHG.   CHG Shower Instructions:  If you choose to wash your hair and private area, wash first with your normal shampoo/soap.  After you use shampoo/soap, rinse your hair and body thoroughly to remove shampoo/soap residue.  Turn the water OFF and apply about 3 tablespoons (45 ml) of CHG soap to a CLEAN washcloth.  Apply CHG soap ONLY FROM YOUR NECK DOWN TO YOUR TOES (washing for 3-5 minutes)  DO NOT use CHG soap on face, private areas, open wounds, or sores.  Pay special attention to the area where your surgery is being performed.  If you are having back surgery, having someone wash your back for you may be helpful. Wait 2 minutes after CHG soap is applied, then you may rinse off the CHG soap.  Pat dry with a clean towel  Put on clean clothes/pajamas   If you choose to wear lotion, please use ONLY the CHG-compatible lotions on the back of this paper.     Additional instructions for the day of surgery: DO NOT APPLY any lotions, deodorants, cologne, or perfumes.   Put on clean/comfortable clothes.  Brush your teeth.  Ask your nurse before applying any prescription medications to the skin.      CHG Compatible Lotions   Aveeno Moisturizing lotion  Cetaphil Moisturizing Cream  Cetaphil Moisturizing Lotion  Clairol Herbal Essence Moisturizing Lotion, Dry Skin  Clairol Herbal Essence Moisturizing Lotion, Extra Dry Skin  Clairol Herbal Essence Moisturizing Lotion, Normal Skin  Curel Age Defying Therapeutic Moisturizing Lotion with Alpha Hydroxy  Curel Extreme Care Body Lotion  Curel Soothing Hands Moisturizing Hand Lotion  Curel Therapeutic Moisturizing Cream, Fragrance-Free  Curel Therapeutic Moisturizing Lotion, Fragrance-Free  Curel Therapeutic Moisturizing Lotion, Original Formula  Eucerin Daily Replenishing Lotion  Eucerin Dry Skin  Therapy Plus Alpha Hydroxy Crme  Eucerin Dry Skin Therapy Plus Alpha Hydroxy Lotion  Eucerin Original Crme  Eucerin Original Lotion  Eucerin Plus Crme Eucerin Plus Lotion  Eucerin TriLipid Replenishing Lotion  Keri Anti-Bacterial Hand Lotion  Keri Deep Conditioning Original Lotion Dry Skin Formula Softly Scented  Keri Deep Conditioning Original Lotion, Fragrance Free Sensitive Skin Formula  Keri Lotion Fast Absorbing Fragrance Free Sensitive Skin Formula  Keri Lotion Fast Absorbing Softly Scented Dry Skin Formula  Keri Original Lotion  Keri Skin Renewal Lotion Keri Silky Smooth Lotion  Keri Silky Smooth Sensitive Skin Lotion  Nivea Body Creamy Conditioning Oil  Nivea Body Extra Enriched Teacher, adult education Moisturizing Lotion Nivea Crme  Nivea Skin Firming Lotion  NutraDerm 30 Skin Lotion  NutraDerm Skin Lotion  NutraDerm Therapeutic Skin Cream  NutraDerm Therapeutic Skin Lotion  ProShield Protective Hand Cream  Provon moisturizing lotion

## 2023-10-08 NOTE — Progress Notes (Signed)
 PCP - Roddy Citizen Merino,MD Cardiologist - denies  PPM/ICD - denies Device Orders -  Rep Notified -   Chest x-ray - na EKG - 10/08/23 Stress Test - denies ECHO - denies Cardiac Cath - denies  Sleep Study - deneis CPAP -   Fasting Blood Sugar - na Checks Blood Sugar _____ times a day  Last dose of GLP1 agonist-  na GLP1 instructions:   Blood Thinner Instructions:na Aspirin Instructions:na  ERAS Protcol -clears until 0915 PRE-SURGERY Ensure or G2- Ensure  COVID TEST- na   Anesthesia review: yes- Pre op clearance by PCP 4/30 in [facility],hx DVT,substance abuse,COPD,HTN  Patient denies shortness of breath, fever, cough and chest pain at PAT appointment   All instructions explained to the patient, with a verbal understanding of the material. Patient agrees to go over the instructions while at home for a better understanding.. The opportunity to ask questions was provided.

## 2023-10-09 ENCOUNTER — Encounter (HOSPITAL_COMMUNITY): Payer: Self-pay

## 2023-10-09 NOTE — Progress Notes (Signed)
 Anesthesia Chart Review:  Case: [id number] Date/Time: 10/19/23 1200   Procedure: ARTHROPLASTY, HIP, TOTAL, ANTERIOR APPROACH (Right: Hip)   Anesthesia type: Spinal   Diagnosis: Primary osteoarthritis of right hip [M16.11]   Pre-op diagnosis: right hip osteoarthritis   [facility] / [facility]   Surgeons: Wes Hamman, MD       DISCUSSION: Patient is a 73 year old male scheduled for the above procedure.  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 Reported an episode of prolonged emergence. Former alcohol and cocaine use (reported being clean & sober for 8 years).  He had preoperative evaluation with labs last month with Olene Berne, MD. See Care Everywhere.   Noted he was on doxycycline through 10/14/23, so I called and spoke with Mr. Heineman. He says that he developed a small lesion on his buttocks about 3 months ago.  He is seeing dermatologist Dr. Denman Fischer. He is on another round of treatment including a "cream" and doxycycline. He says the lesion is not draining, but admits he can not view it very well due to its location. He is unsure of the diagnosis but was given the impression it occurred due to sitting too long (activity is limited due to painful peripheral neuropathy). He is scheduled to go to [place] on 10/13/23, so I advised he ask staff if provider needs to evaluate the area while at their office. I also seen a staff message to Dr. Christiane Cowing and PA Loris Ros.    EKG showed NSR. He denied known cardiac history. He denied chest pain, SOB, edema. He reported prolonged emergence with vocal cord  surgery on 12/25/11. He is scheduled for colonoscopy and EGD after recovery from THA. H/H stable at 12.8/38.4. He says he has been clean and sober for 8 years now.   Anesthesia team to evaluate on the day of surgery. Surgeon has ordered a UDS, to be done on the day of surgery.    VS: BP 123/73   Pulse 87   Temp 36.9 C   Resp 18   Ht 6' (1.829 m)   Wt 90.8 kg   SpO2 96%   BMI 27.15 kg/m    PROVIDERS: Olene Berne, MD is PCP [facility]) - Colen Daunt, MD is PCP at [facility]. Last visti 06/26/23.  Lyna Sandhoff, DO is [facility]). Last visit 07/28/23 for MGUS and IDA follow-up. HGB improved on iron. Pending GI evaluation. Continue surveillance of MUGUS, no current evidence of end organ damage that can be attributed to plasma cell dyscrasia. 3 month follow-up planned.  Drexel Gentles, PA is GI provider 08/13/23 consult for IDA. Prior inadequate prep for 2019 colonoscopy at [facility]. Plan for future EGD and colonoscopy. GERD controlled with PPI. Tolerating iron for IDA. No hematochezia.  - Patel, Donika, DO is neurologist. Last visit 05/12/22 for essential tremor. He had declined DBS . Continue propranolol  and primidone . Consider referral to movement disorder specialist if worsening symptoms.   - Lylia Sand, MD is PM&R   LABS: Preoperative labs noted. Cr 1.37, eGFR 54 (previously Cr 1.19, eGFR 67 10/07/23 ([facility]) & Cr 1.21, eGRF 1.21 on 07/28/23 [facility]).  (  all labs ordered are listed, but only abnormal results are displayed)  Labs Reviewed  CBC - Abnormal; Notable for the following components:      Result Value   Hemoglobin 12.8 (*)    HCT 38.4 (*)    All other components within normal limits  BASIC METABOLIC PANEL WITH GFR - Abnormal; Notable for the following components:   Potassium 3.3 (*)    Creatinine, Ser 1.37 (*)    GFR, Estimated 54 (*)    All other components within normal limits  SURGICAL PCR SCREEN  TYPE  AND SCREEN   A1c 5.6% on 10/07/23 ([facility] Everywhere)   IMAGES: Xray right hip 09/08/23: X-rays of the right hip show advanced degenerative joint disease with  bone-on-bone joint space narrowing.    EKG: 10/08/23: Normal sinus rhythm Normal ECG When compared with ECG of 23-Oct-2012 19:38, No significant change since last tracing Confirmed by Ahmad Alert ([id number]) on 10/08/2023 8:47:47 PM   CV: He did not think he had a previous stress, echo, or cath. Requested echo for [facility] if available.    Past Medical History:  Diagnosis Date   Arthritis    Complication of anesthesia    slow to wake up after last surgery   COPD (chronic obstructive pulmonary disease) (HCC)    Depression    DVT (deep venous thrombosis) (HCC)    GERD (gastroesophageal reflux disease)    Hepatitis    hep c   High blood pressure    High cholesterol    MGUS (monoclonal gammopathy of unknown significance)    Neuropathy    Peripheral neuropathy    feet and hands    Past Surgical History:  Procedure Laterality Date   DIRECT LARYNGOSCOPY  12/26/2011   Procedure: DIRECT LARYNGOSCOPY;  Surgeon: Littie Rife, MD;  Location: [facility] OR;  Service: ENT;  Laterality: N/A;  Directy Laryngoscopy with biopsy [id number]   DIRECT LARYNGOSCOPY N/A 10/21/2012   Procedure: DIRECT LARYNGOSCOPY;  Surgeon: Littie Rife, MD;  Location: [facility] OR;  Service: ENT;  Laterality: N/A;   ESOPHAGOGASTRODUODENOSCOPY N/A 10/25/2012   Procedure: ESOPHAGOGASTRODUODENOSCOPY (EGD);  Surgeon: Tami Falcon, MD;  Location: [facility] ENDOSCOPY;  Service: Endoscopy;  Laterality: N/A;   TONSILLECTOMY      MEDICATIONS:  acetaminophen  (TYLENOL ) 500 MG tablet   amLODipine (NORVASC) 5 MG tablet   Ascorbic Acid  (VITAMIN C ) 1000 MG tablet   cyanocobalamin (VITAMIN B12) 500 MCG tablet   doxycycline (VIBRAMYCIN) 100 MG capsule   DULoxetine (CYMBALTA) 60 MG capsule   ferrous sulfate 325 (65 FE) MG EC tablet   fluticasone  (FLONASE ) 50 MCG/ACT nasal spray    furosemide (LASIX) 20 MG tablet   loratadine (CLARITIN) 10 MG tablet   lovastatin  (MEVACOR ) 20 MG tablet   Magnesium  Citrate 200 MG TABS   methocarbamol  (ROBAXIN -750) 750 MG tablet   Misc Natural Products (COMPLETE PROSTATE HEALTH) TABS   Multiple Vitamin (MULTIVITAMIN WITH MINERALS) TABS tablet   ondansetron  (ZOFRAN ) 4 MG tablet   oxyCODONE -acetaminophen  (PERCOCET) 5-325 MG tablet   pantoprazole  (PROTONIX ) 40 MG tablet   POTASSIUM BICARBONATE PO   pregabalin  (LYRICA ) 225 MG capsule   senna (SENOKOT) 8.6 MG TABS tablet   traZODone  (DESYREL ) 100 MG tablet   No current facility-administered medications for this encounter.    Ella Gun, PA-C Surgical Short Stay/Anesthesiology [facility] Phone [phone] [facility] Phone [phone] 10/09/2023 2:52 PM

## 2023-10-12 ENCOUNTER — Other Ambulatory Visit: Payer: Self-pay | Admitting: Family Medicine

## 2023-10-12 DIAGNOSIS — Z0000 Encounter for general adult medical examination without abnormal findings: Secondary | ICD-10-CM

## 2023-10-13 ENCOUNTER — Ambulatory Visit: Admitting: Orthopaedic Surgery

## 2023-10-13 ENCOUNTER — Telehealth: Payer: Self-pay | Admitting: Orthopaedic Surgery

## 2023-10-13 ENCOUNTER — Encounter: Admitting: Physical Therapy

## 2023-10-13 ENCOUNTER — Ambulatory Visit: Admitting: Physician Assistant

## 2023-10-13 ENCOUNTER — Ambulatory Visit: Admitting: Gastroenterology

## 2023-10-13 DIAGNOSIS — L89329 Pressure ulcer of left buttock, unspecified stage: Secondary | ICD-10-CM | POA: Diagnosis not present

## 2023-10-13 NOTE — Telephone Encounter (Signed)
 Called patient to let him know he would need to be seen prior to surgery due to the lesion on his bottom.  Patient has physical therapy appointment today at 3:15 here at [facility].  Offered patient 2:45 or 3pm appointment with Loris Ros or Dr. Christiane Cowing or an appointment at 8:45am tomorrow.  Patient states he is not able to come earlier because has pre-planned transportation.  Appointment scheduled at 3:45pm with Loris Ros.  Patient states he would prefer to cancel his physical therapy appointment today because he is not able to come back tomorrow either.

## 2023-10-13 NOTE — Progress Notes (Signed)
 Office Visit Note   Patient: Thomas Mcneil           Date of Birth: 1950/08/26           MRN: [id number] Visit Date: 10/13/2023              Requested by: Olene Berne, MD [address] [place],  [place] [address] PCP: Olene Berne, MD   Assessment & Plan: Visit Diagnoses:  1. Pressure injury of skin of left buttock, unspecified injury stage     Plan: History of Present Illness ERCOLE IDEMA is a 73 year old male who presents with a sore on his bottom.  He has a sore on his bottom, suspected to be a pressure ulcer due to prolonged sitting. He has been receiving treatment and has consulted a dermatologist. The sore is currently small and shows signs of improvement.  Physical Exam SKIN: Pinpoint lesion on skin, minimal size.  No skin breakdown.  No drainage  Assessment and Plan Pressure ulcer Chronic pressure ulcer on buttocks, no skin breakdown.  Barely a skin lesion.  No concerns from my stand point. - Proceed with scheduled surgery next Monday.  Follow-Up Instructions: No follow-ups on file.   Orders:  No orders of the defined types were placed in this encounter.  No orders of the defined types were placed in this encounter.     Procedures: No procedures performed   Clinical Data: No additional findings.   Subjective: Chief Complaint  Patient presents with   Right Hip - Wound Check    HPI  Review of Systems  Constitutional: Negative.   HENT: Negative.    Eyes: Negative.   Respiratory: Negative.    Cardiovascular: Negative.   Gastrointestinal: Negative.   Endocrine: Negative.   Genitourinary: Negative.   Skin: Negative.   Allergic/Immunologic: Negative.   Neurological: Negative.   Hematological: Negative.   Psychiatric/Behavioral: Negative.    All other systems reviewed and are negative.    Objective: Vital Signs: There were no vitals taken for this visit.  Physical Exam Vitals and nursing note reviewed.   Constitutional:      Appearance: He is well-developed.  HENT:     Head: Normocephalic and atraumatic.  Eyes:     Pupils: Pupils are equal, round, and reactive to light.  Pulmonary:     Effort: Pulmonary effort is normal.  Abdominal:     Palpations: Abdomen is soft.  Musculoskeletal:        General: Normal range of motion.     Cervical back: Neck supple.  Skin:    General: Skin is warm.  Neurological:     Mental Status: He is alert and oriented to person, place, and time.  Psychiatric:        Behavior: Behavior normal.        Thought Content: Thought content normal.        Judgment: Judgment normal.    PMFS History: Patient Active Problem List   Diagnosis Date Noted   Pressure injury of skin of left buttock 10/13/2023   Primary osteoarthritis of right hip 09/08/2023   Cocaine dependence, binge pattern (HCC) 11/26/2015   Substance induced mood disorder (HCC) 11/26/2015   Intention tremor 11/26/2015   Alcohol use disorder, severe, dependence (HCC) 09/04/2015   Severe recurrent major depression without psychotic features (HCC) 09/04/2015   Alcohol dependence, uncomplicated (HCC) 09/01/2015   Alcohol-induced mood disorder (HCC) 09/01/2015   Cocaine abuse (HCC) 09/01/2015   Alcohol  abuse 04/05/2014   Essential tremor 01/04/2014   Other and unspecified hyperlipidemia 01/04/2014   B12 deficiency 01/04/2014   Melena 10/23/2012   GERD 05/27/2010   SHOULDER PAIN, LEFT 05/21/2010   ONYCHOMYCOSIS, TOENAILS 05/09/2010   KNEE PAIN, BILATERAL 06/27/2009   LEG CRAMPS 06/27/2009   Contusion 08/18/2008   LEG EDEMA, BILATERAL 01/13/2008   ERECTILE DYSFUNCTION 10/15/2007   TOBACCO ABUSE 08/16/2007   Mononeuritis of lower limb 08/16/2007   CORNS AND CALLUSES 08/16/2007   ALCOHOL ABUSE, HX OF 08/16/2007   Past Medical History:  Diagnosis Date   Arthritis    Complication of anesthesia    slow to wake up after last surgery   COPD (chronic obstructive pulmonary disease) (HCC)     Depression    DVT (deep venous thrombosis) (HCC)    GERD (gastroesophageal reflux disease)    Hepatitis    hep c   High blood pressure    High cholesterol    MGUS (monoclonal gammopathy of unknown significance)    Neuropathy    Peripheral neuropathy    feet and hands    Family History  Problem Relation Age of Onset   Hypertension Mother    Diverticulosis Mother    GER disease Mother    Lung disease Sister    Polymyositis Sister    Alcohol abuse Maternal Uncle     Past Surgical History:  Procedure Laterality Date   DIRECT LARYNGOSCOPY  12/26/2011   Procedure: DIRECT LARYNGOSCOPY;  Surgeon: Littie Rife, MD;  Location: [facility] OR;  Service: ENT;  Laterality: N/A;  Directy Laryngoscopy with biopsy [id number]   DIRECT LARYNGOSCOPY N/A 10/21/2012   Procedure: DIRECT LARYNGOSCOPY;  Surgeon: Littie Rife, MD;  Location: [facility] OR;  Service: ENT;  Laterality: N/A;   ESOPHAGOGASTRODUODENOSCOPY N/A 10/25/2012   Procedure: ESOPHAGOGASTRODUODENOSCOPY (EGD);  Surgeon: Tami Falcon, MD;  Location: [facility] ENDOSCOPY;  Service: Endoscopy;  Laterality: N/A;   TONSILLECTOMY     Social History   Occupational History   Not on file  Tobacco Use   Smoking status: Some Days    Current packs/day: 0.25    Average packs/day: 0.2 packs/day for 45.7 years (11.4 ttl pk-yrs)    Types: Cigarettes    Start date: 06/09/2012   Smokeless tobacco: Never   Tobacco comments:    quitting, using e-cig    05/12/2022- smokes on occasion  Vaping Use   Vaping status: Former  Substance and Sexual Activity   Alcohol use: Not Currently    Alcohol/week: 70.0 standard drinks of alcohol    Types: 70 Cans of beer per week    Comment: [place] - currently in [facility] for counseling/rehab. last drink 06/29/14   Drug use: Not Currently    Frequency: 0.2 times per week    Types: "Crack" cocaine    Comment: "Crack" quit 06/29/14   Sexual activity: Not on file

## 2023-10-14 NOTE — Anesthesia Preprocedure Evaluation (Addendum)
 Anesthesia Evaluation  Patient identified by MRN, date of birth, ID band Patient awake    Reviewed: Allergy & Precautions, NPO status , Patient's Chart, lab work & pertinent test results  Airway Mallampati: II  TM Distance: >3 FB Neck ROM: Full    Dental  (+) Dental Advisory Given, Missing   Pulmonary COPD, Current Smoker and Patient abstained from smoking.   Pulmonary exam normal breath sounds clear to auscultation       Cardiovascular hypertension, + DVT  Normal cardiovascular exam Rhythm:Regular Rate:Normal     Neuro/Psych  PSYCHIATRIC DISORDERS  Depression     Neuromuscular disease    GI/Hepatic ,GERD  Medicated,,(+)     substance abuse  alcohol use and cocaine use  Endo/Other  negative endocrine ROS    Renal/GU negative Renal ROS     Musculoskeletal  (+) Arthritis ,    Abdominal   Peds  Hematology  (+) Blood dyscrasia, anemia Plt 235k   Anesthesia Other Findings Day of surgery medications reviewed with the patient.  Reproductive/Obstetrics                             Anesthesia Physical Anesthesia Plan  ASA: 3  Anesthesia Plan: Spinal   Post-op Pain Management: Tylenol  PO (pre-op)*   Induction: Intravenous  PONV Risk Score and Plan: 0 and TIVA, Dexamethasone  and Ondansetron   Airway Management Planned: Natural Airway and Simple Face Mask  Additional Equipment:   Intra-op Plan:   Post-operative Plan:   Informed Consent: I have reviewed the patients History and Physical, chart, labs and discussed the procedure including the risks, benefits and alternatives for the proposed anesthesia with the patient or authorized representative who has indicated his/her understanding and acceptance.     Dental advisory given  Plan Discussed with: CRNA  Anesthesia Plan Comments: (PAT note written by Allison Zelenak, PA-C.  )       Anesthesia Quick Evaluation

## 2023-10-16 MED ORDER — TRANEXAMIC ACID 1000 MG/10ML IV SOLN
2000.0000 mg | INTRAVENOUS | Status: DC
  Filled 2023-10-16: qty 20

## 2023-10-19 ENCOUNTER — Other Ambulatory Visit (HOSPITAL_COMMUNITY): Payer: Self-pay

## 2023-10-19 ENCOUNTER — Observation Stay (HOSPITAL_COMMUNITY)

## 2023-10-19 ENCOUNTER — Observation Stay (HOSPITAL_COMMUNITY)
Admission: RE | Admit: 2023-10-19 | Discharge: 2023-10-20 | Disposition: A | Discharge status: Home / Self Care | Attending: Orthopaedic Surgery | Admitting: Orthopaedic Surgery

## 2023-10-19 ENCOUNTER — Other Ambulatory Visit: Payer: Self-pay

## 2023-10-19 ENCOUNTER — Ambulatory Visit (HOSPITAL_COMMUNITY): Payer: Self-pay | Admitting: Vascular Surgery

## 2023-10-19 ENCOUNTER — Encounter (HOSPITAL_COMMUNITY)
Admission: RE | Disposition: A | Discharge status: Home / Self Care | Payer: Self-pay | Source: Home / Self Care | Attending: Orthopaedic Surgery

## 2023-10-19 ENCOUNTER — Ambulatory Visit (HOSPITAL_BASED_OUTPATIENT_CLINIC_OR_DEPARTMENT_OTHER): Payer: Self-pay | Admitting: Anesthesiology

## 2023-10-19 ENCOUNTER — Ambulatory Visit (HOSPITAL_COMMUNITY)

## 2023-10-19 ENCOUNTER — Encounter (HOSPITAL_COMMUNITY): Payer: Self-pay | Admitting: Orthopaedic Surgery

## 2023-10-19 ENCOUNTER — Other Ambulatory Visit: Payer: Self-pay | Admitting: Physician Assistant

## 2023-10-19 DIAGNOSIS — I10 Essential (primary) hypertension: Secondary | ICD-10-CM | POA: Insufficient documentation

## 2023-10-19 DIAGNOSIS — Z79899 Other long term (current) drug therapy: Secondary | ICD-10-CM | POA: Diagnosis not present

## 2023-10-19 DIAGNOSIS — J449 Chronic obstructive pulmonary disease, unspecified: Secondary | ICD-10-CM | POA: Insufficient documentation

## 2023-10-19 DIAGNOSIS — M1611 Unilateral primary osteoarthritis, right hip: Secondary | ICD-10-CM

## 2023-10-19 DIAGNOSIS — F17210 Nicotine dependence, cigarettes, uncomplicated: Secondary | ICD-10-CM | POA: Insufficient documentation

## 2023-10-19 DIAGNOSIS — Z86718 Personal history of other venous thrombosis and embolism: Secondary | ICD-10-CM | POA: Insufficient documentation

## 2023-10-19 DIAGNOSIS — Z7901 Long term (current) use of anticoagulants: Secondary | ICD-10-CM | POA: Diagnosis not present

## 2023-10-19 DIAGNOSIS — Z96641 Presence of right artificial hip joint: Secondary | ICD-10-CM

## 2023-10-19 DIAGNOSIS — F1420 Cocaine dependence, uncomplicated: Principal | ICD-10-CM

## 2023-10-19 HISTORY — PX: TOTAL HIP ARTHROPLASTY: SHX124

## 2023-10-19 LAB — RAPID URINE DRUG SCREEN, HOSP PERFORMED
Amphetamines: NOT DETECTED
Barbiturates: NOT DETECTED
Benzodiazepines: NOT DETECTED
Cocaine: NOT DETECTED
Opiates: NOT DETECTED
Tetrahydrocannabinol: NOT DETECTED

## 2023-10-19 SURGERY — ARTHROPLASTY, HIP, TOTAL, ANTERIOR APPROACH
Anesthesia: Spinal | Site: Hip | Laterality: Right

## 2023-10-19 MED ORDER — DOXYCYCLINE HYCLATE 100 MG PO TABS
100.0000 mg | ORAL_TABLET | Freq: Two times a day (BID) | ORAL | Status: DC
Start: 2023-10-19 — End: 2023-10-20
  Administered 2023-10-19 – 2023-10-20 (×2): 100 mg via ORAL
  Filled 2023-10-19 (×2): qty 1

## 2023-10-19 MED ORDER — ACETAMINOPHEN 500 MG PO TABS
1000.0000 mg | ORAL_TABLET | Freq: Four times a day (QID) | ORAL | Status: AC
  Administered 2023-10-19 – 2023-10-20 (×4): 1000 mg via ORAL
  Filled 2023-10-19 (×4): qty 2

## 2023-10-19 MED ORDER — HYDROMORPHONE HCL 1 MG/ML IJ SOLN: 0.5000 mg | INTRAMUSCULAR | Status: DC | PRN

## 2023-10-19 MED ORDER — TRANEXAMIC ACID 1000 MG/10ML IV SOLN
INTRAVENOUS | Status: DC | PRN
  Administered 2023-10-19: 2000 mg via TOPICAL

## 2023-10-19 MED ORDER — FENTANYL CITRATE (PF) 100 MCG/2ML IJ SOLN: 25.0000 ug | INTRAMUSCULAR | Status: DC | PRN

## 2023-10-19 MED ORDER — MENTHOL 3 MG MT LOZG: 1.0000 | LOZENGE | OROMUCOSAL | Status: DC | PRN

## 2023-10-19 MED ORDER — ONDANSETRON HCL 4 MG/2ML IJ SOLN
INTRAMUSCULAR | Status: AC
  Filled 2023-10-19: qty 2

## 2023-10-19 MED ORDER — DEXAMETHASONE SODIUM PHOSPHATE 10 MG/ML IJ SOLN
INTRAMUSCULAR | Status: AC
  Filled 2023-10-19: qty 1

## 2023-10-19 MED ORDER — DEXAMETHASONE SODIUM PHOSPHATE 10 MG/ML IJ SOLN
10.0000 mg | Freq: Once | INTRAMUSCULAR | Status: AC
  Administered 2023-10-20: 10 mg via INTRAVENOUS
  Filled 2023-10-19: qty 1

## 2023-10-19 MED ORDER — VANCOMYCIN HCL 1 G IV SOLR
INTRAVENOUS | Status: DC | PRN
  Administered 2023-10-19: 1000 mg via TOPICAL

## 2023-10-19 MED ORDER — FERROUS SULFATE 325 (65 FE) MG PO TABS
325.0000 mg | ORAL_TABLET | Freq: Every day | ORAL | Status: DC
Start: 2023-10-20 — End: 2023-10-20
  Administered 2023-10-20: 325 mg via ORAL
  Filled 2023-10-19 (×2): qty 1

## 2023-10-19 MED ORDER — VANCOMYCIN HCL 1000 MG IV SOLR
INTRAVENOUS | Status: AC
  Filled 2023-10-19: qty 20

## 2023-10-19 MED ORDER — METHOCARBAMOL 500 MG PO TABS: 500.0000 mg | ORAL_TABLET | Freq: Four times a day (QID) | ORAL | Status: DC | PRN

## 2023-10-19 MED ORDER — METOCLOPRAMIDE HCL 5 MG PO TABS: 5.0000 mg | ORAL_TABLET | Freq: Three times a day (TID) | ORAL | Status: DC | PRN

## 2023-10-19 MED ORDER — PHENOL 1.4 % MT LIQD: 1.0000 | OROMUCOSAL | Status: DC | PRN

## 2023-10-19 MED ORDER — OXYCODONE HCL 5 MG PO TABS: 5.0000 mg | ORAL_TABLET | ORAL | Status: DC | PRN

## 2023-10-19 MED ORDER — LACTATED RINGERS IV SOLN: INTRAVENOUS | Status: DC | PRN

## 2023-10-19 MED ORDER — CEFAZOLIN SODIUM-DEXTROSE 2-4 GM/100ML-% IV SOLN
INTRAVENOUS | Status: AC
  Filled 2023-10-19: qty 100

## 2023-10-19 MED ORDER — ACETAMINOPHEN 325 MG PO TABS: 325.0000 mg | ORAL_TABLET | Freq: Four times a day (QID) | ORAL | Status: DC | PRN

## 2023-10-19 MED ORDER — OXYCODONE HCL 5 MG PO TABS
10.0000 mg | ORAL_TABLET | ORAL | Status: DC | PRN
  Administered 2023-10-20: 10 mg via ORAL
  Filled 2023-10-19: qty 2

## 2023-10-19 MED ORDER — METHOCARBAMOL 1000 MG/10ML IJ SOLN: 500.0000 mg | Freq: Four times a day (QID) | INTRAMUSCULAR | Status: DC | PRN

## 2023-10-19 MED ORDER — SODIUM CHLORIDE 0.9 % IR SOLN
Status: DC | PRN
  Administered 2023-10-19: 1000 mL

## 2023-10-19 MED ORDER — PRONTOSAN WOUND IRRIGATION OPTIME
TOPICAL | Status: DC | PRN
  Administered 2023-10-19: 350 mL

## 2023-10-19 MED ORDER — ALUM & MAG HYDROXIDE-SIMETH 200-200-20 MG/5ML PO SUSP: 30.0000 mL | ORAL | Status: DC | PRN

## 2023-10-19 MED ORDER — BUPIVACAINE IN DEXTROSE 0.75-8.25 % IT SOLN
INTRATHECAL | Status: DC | PRN
  Administered 2023-10-19: 2 mL via INTRATHECAL

## 2023-10-19 MED ORDER — AMLODIPINE BESYLATE 5 MG PO TABS
5.0000 mg | ORAL_TABLET | Freq: Every day | ORAL | Status: DC
Start: 2023-10-19 — End: 2023-10-20
  Administered 2023-10-19 – 2023-10-20 (×2): 5 mg via ORAL
  Filled 2023-10-19 (×2): qty 1

## 2023-10-19 MED ORDER — PROPOFOL 10 MG/ML IV BOLUS
INTRAVENOUS | Status: DC | PRN
  Administered 2023-10-19: 60 mg via INTRAVENOUS
  Administered 2023-10-19: 50 ug/kg/min via INTRAVENOUS

## 2023-10-19 MED ORDER — DEXAMETHASONE SODIUM PHOSPHATE 10 MG/ML IJ SOLN
INTRAMUSCULAR | Status: DC | PRN
  Administered 2023-10-19: 10 mg via INTRAVENOUS

## 2023-10-19 MED ORDER — ORAL CARE MOUTH RINSE: 15.0000 mL | Freq: Once | OROMUCOSAL | Status: AC

## 2023-10-19 MED ORDER — PANTOPRAZOLE SODIUM 40 MG PO TBEC
40.0000 mg | DELAYED_RELEASE_TABLET | Freq: Every day | ORAL | Status: DC
  Administered 2023-10-19 – 2023-10-20 (×2): 40 mg via ORAL
  Filled 2023-10-19 (×2): qty 1

## 2023-10-19 MED ORDER — APIXABAN 2.5 MG PO TABS
2.5000 mg | ORAL_TABLET | Freq: Two times a day (BID) | ORAL | Status: DC
  Administered 2023-10-20: 2.5 mg via ORAL
  Filled 2023-10-19: qty 1

## 2023-10-19 MED ORDER — LIDOCAINE 2% (20 MG/ML) 5 ML SYRINGE
INTRAMUSCULAR | Status: DC | PRN
  Administered 2023-10-19: 60 mg via INTRAVENOUS

## 2023-10-19 MED ORDER — ONDANSETRON HCL 4 MG/2ML IJ SOLN: 4.0000 mg | Freq: Four times a day (QID) | INTRAMUSCULAR | Status: DC | PRN

## 2023-10-19 MED ORDER — ONDANSETRON HCL 4 MG/2ML IJ SOLN: 4.0000 mg | Freq: Once | INTRAMUSCULAR | Status: DC | PRN

## 2023-10-19 MED ORDER — CEFAZOLIN SODIUM-DEXTROSE 2-4 GM/100ML-% IV SOLN
2.0000 g | INTRAVENOUS | Status: AC
  Administered 2023-10-19: 2 g via INTRAVENOUS

## 2023-10-19 MED ORDER — TRANEXAMIC ACID-NACL 1000-0.7 MG/100ML-% IV SOLN
INTRAVENOUS | Status: AC
  Filled 2023-10-19: qty 100

## 2023-10-19 MED ORDER — TRANEXAMIC ACID-NACL 1000-0.7 MG/100ML-% IV SOLN
1000.0000 mg | Freq: Once | INTRAVENOUS | Status: AC
  Administered 2023-10-19: 1000 mg via INTRAVENOUS
  Filled 2023-10-19: qty 100

## 2023-10-19 MED ORDER — METOCLOPRAMIDE HCL 5 MG/ML IJ SOLN: 5.0000 mg | Freq: Three times a day (TID) | INTRAMUSCULAR | Status: DC | PRN

## 2023-10-19 MED ORDER — POLYETHYLENE GLYCOL 3350 17 G PO PACK
17.0000 g | PACK | Freq: Every day | ORAL | Status: DC
  Administered 2023-10-19: 17 g via ORAL
  Filled 2023-10-19 (×2): qty 1

## 2023-10-19 MED ORDER — ACETAMINOPHEN 500 MG PO TABS: 1000.0000 mg | ORAL_TABLET | Freq: Once | ORAL | Status: AC

## 2023-10-19 MED ORDER — ONDANSETRON HCL 4 MG PO TABS: 4.0000 mg | ORAL_TABLET | Freq: Four times a day (QID) | ORAL | Status: DC | PRN

## 2023-10-19 MED ORDER — LIDOCAINE 2% (20 MG/ML) 5 ML SYRINGE
INTRAMUSCULAR | Status: AC
  Filled 2023-10-19: qty 5

## 2023-10-19 MED ORDER — DOCUSATE SODIUM 100 MG PO CAPS
100.0000 mg | ORAL_CAPSULE | Freq: Two times a day (BID) | ORAL | Status: DC
  Administered 2023-10-19: 100 mg via ORAL
  Filled 2023-10-19 (×2): qty 1

## 2023-10-19 MED ORDER — SORBITOL 70 % SOLN: 30.0000 mL | Freq: Every day | Status: DC | PRN

## 2023-10-19 MED ORDER — BUPIVACAINE-MELOXICAM ER 400-12 MG/14ML IJ SOLN
INTRAMUSCULAR | Status: AC
  Filled 2023-10-19: qty 1

## 2023-10-19 MED ORDER — CHLORHEXIDINE GLUCONATE 0.12 % MT SOLN
OROMUCOSAL | Status: AC
  Administered 2023-10-19: 15 mL via OROMUCOSAL
  Filled 2023-10-19: qty 15

## 2023-10-19 MED ORDER — POVIDONE-IODINE 10 % EX SWAB
2.0000 | Freq: Once | CUTANEOUS | Status: AC
  Administered 2023-10-19: 2 via TOPICAL

## 2023-10-19 MED ORDER — DIPHENHYDRAMINE HCL 12.5 MG/5ML PO ELIX: 25.0000 mg | ORAL_SOLUTION | ORAL | Status: DC | PRN

## 2023-10-19 MED ORDER — APIXABAN 2.5 MG PO TABS
2.5000 mg | ORAL_TABLET | Freq: Two times a day (BID) | ORAL | 0 refills | Status: DC
  Filled 2023-10-19: qty 60, fill #0
  Filled 2023-10-20: qty 60, 30d supply, fill #0

## 2023-10-19 MED ORDER — MAGNESIUM CITRATE PO SOLN: 1.0000 | Freq: Once | ORAL | Status: DC | PRN

## 2023-10-19 MED ORDER — BUPIVACAINE-MELOXICAM ER 400-12 MG/14ML IJ SOLN
INTRAMUSCULAR | Status: DC | PRN
  Administered 2023-10-19: 400 mg

## 2023-10-19 MED ORDER — CEFAZOLIN SODIUM-DEXTROSE 2-4 GM/100ML-% IV SOLN
2.0000 g | Freq: Four times a day (QID) | INTRAVENOUS | Status: AC
  Administered 2023-10-19 (×2): 2 g via INTRAVENOUS
  Filled 2023-10-19 (×2): qty 100

## 2023-10-19 MED ORDER — TRANEXAMIC ACID-NACL 1000-0.7 MG/100ML-% IV SOLN
1000.0000 mg | INTRAVENOUS | Status: AC
  Administered 2023-10-19: 1000 mg via INTRAVENOUS

## 2023-10-19 MED ORDER — MIDAZOLAM HCL 2 MG/2ML IJ SOLN
INTRAMUSCULAR | Status: AC
  Filled 2023-10-19: qty 2

## 2023-10-19 MED ORDER — DULOXETINE HCL 60 MG PO CPEP
60.0000 mg | ORAL_CAPSULE | Freq: Two times a day (BID) | ORAL | Status: DC
  Administered 2023-10-19 – 2023-10-20 (×2): 60 mg via ORAL
  Filled 2023-10-19 (×2): qty 1

## 2023-10-19 MED ORDER — 0.9 % SODIUM CHLORIDE (POUR BTL) OPTIME
TOPICAL | Status: DC | PRN
  Administered 2023-10-19: 1000 mL

## 2023-10-19 MED ORDER — ONDANSETRON HCL 4 MG/2ML IJ SOLN
INTRAMUSCULAR | Status: DC | PRN
  Administered 2023-10-19: 4 mg via INTRAVENOUS

## 2023-10-19 MED ORDER — OXYCODONE HCL ER 10 MG PO T12A
10.0000 mg | EXTENDED_RELEASE_TABLET | Freq: Two times a day (BID) | ORAL | Status: DC
  Administered 2023-10-19 – 2023-10-20 (×2): 10 mg via ORAL
  Filled 2023-10-19 (×2): qty 1

## 2023-10-19 MED ORDER — ACETAMINOPHEN 500 MG PO TABS
ORAL_TABLET | ORAL | Status: AC
  Administered 2023-10-19: 1000 mg via ORAL
  Filled 2023-10-19: qty 2

## 2023-10-19 MED ORDER — CHLORHEXIDINE GLUCONATE 0.12 % MT SOLN: 15.0000 mL | Freq: Once | OROMUCOSAL | Status: AC

## 2023-10-19 SURGICAL SUPPLY — 52 items
BAG COUNTER SPONGE SURGICOUNT (BAG) ×1 IMPLANT
BAG DECANTER FOR FLEXI CONT (MISCELLANEOUS) ×1 IMPLANT
BLADE SAG 18X100X1.27 (BLADE) ×1 IMPLANT
COVER PERINEAL POST (MISCELLANEOUS) ×1 IMPLANT
COVER SURGICAL LIGHT HANDLE (MISCELLANEOUS) ×1 IMPLANT
CUP ACETAB W/GRIPTION 54 (Plate) IMPLANT
DERMABOND ADVANCED .7 DNX12 (GAUZE/BANDAGES/DRESSINGS) IMPLANT
DRAPE C-ARM 42X72 X-RAY (DRAPES) ×1 IMPLANT
DRAPE POUCH INSTRU U-SHP 10X18 (DRAPES) ×1 IMPLANT
DRAPE STERI IOBAN 125X83 (DRAPES) ×1 IMPLANT
DRAPE U-SHAPE 47X51 STRL (DRAPES) ×2 IMPLANT
DRSG AQUACEL AG ADV 3.5X10 (GAUZE/BANDAGES/DRESSINGS) ×1 IMPLANT
DURAPREP 26ML APPLICATOR (WOUND CARE) ×2 IMPLANT
ELECTRODE BLDE 4.0 EZ CLN MEGD (MISCELLANEOUS) ×1 IMPLANT
ELECTRODE REM PT RTRN 9FT ADLT (ELECTROSURGICAL) ×1 IMPLANT
GLOVE BIOGEL PI IND STRL 7.0 (GLOVE) ×2 IMPLANT
GLOVE BIOGEL PI IND STRL 7.5 (GLOVE) ×5 IMPLANT
GLOVE ECLIPSE 7.0 STRL STRAW (GLOVE) ×2 IMPLANT
GLOVE SKINSENSE STRL SZ7.5 (GLOVE) ×1 IMPLANT
GLOVE SURG SYN 7.5 E (GLOVE) ×2 IMPLANT
GLOVE SURG SYN 7.5 PF PI (GLOVE) ×2 IMPLANT
GLOVE SURG UNDER POLY LF SZ7 (GLOVE) ×3 IMPLANT
GLOVE SURG UNDER POLY LF SZ7.5 (GLOVE) ×2 IMPLANT
GOWN STRL REUS W/ TWL LRG LVL3 (GOWN DISPOSABLE) IMPLANT
GOWN STRL REUS W/ TWL XL LVL3 (GOWN DISPOSABLE) ×1 IMPLANT
GOWN STRL SURGICAL XL XLNG (GOWN DISPOSABLE) ×1 IMPLANT
GOWN TOGA ZIPPER T7+ PEEL AWAY (MISCELLANEOUS) ×1 IMPLANT
HEAD CERAMIC DELTA 36 PLUS 1.5 (Hips) IMPLANT
HOOD PEEL AWAY T7 (MISCELLANEOUS) ×1 IMPLANT
IV NS IRRIG 3000ML ARTHROMATIC (IV SOLUTION) ×1 IMPLANT
KIT BASIN OR (CUSTOM PROCEDURE TRAY) ×1 IMPLANT
LINER NEUTRAL 54X36MM PLUS 4 (Hips) IMPLANT
MARKER SKIN DUAL TIP RULER LAB (MISCELLANEOUS) ×1 IMPLANT
NDL SPNL 18GX3.5 QUINCKE PK (NEEDLE) ×1 IMPLANT
NEEDLE SPNL 18GX3.5 QUINCKE PK (NEEDLE) ×1 IMPLANT
PACK TOTAL JOINT (CUSTOM PROCEDURE TRAY) ×1 IMPLANT
PACK UNIVERSAL I (CUSTOM PROCEDURE TRAY) ×1 IMPLANT
SET HNDPC FAN SPRY TIP SCT (DISPOSABLE) ×1 IMPLANT
SOLUTION PRONTOSAN WOUND 350ML (IRRIGATION / IRRIGATOR) ×1 IMPLANT
STEM FEMORAL SZ6 HIGH ACTIS (Stem) IMPLANT
SUT ETHIBOND 2 V 37 (SUTURE) ×1 IMPLANT
SUT ETHILON 2 0 FS 18 (SUTURE) IMPLANT
SUT STRATAFIX 1PDS 45CM VIOLET (SUTURE) IMPLANT
SUT VIC AB 0 CT1 27XBRD ANBCTR (SUTURE) ×1 IMPLANT
SUT VIC AB 1 CTX36XBRD ANBCTR (SUTURE) ×1 IMPLANT
SUT VIC AB 2-0 CT1 TAPERPNT 27 (SUTURE) ×2 IMPLANT
SYR 50ML LL SCALE MARK (SYRINGE) ×1 IMPLANT
TOWEL GREEN STERILE (TOWEL DISPOSABLE) ×1 IMPLANT
TRAY CATH INTERMITTENT SS 16FR (CATHETERS) IMPLANT
TRAY FOLEY W/BAG SLVR 16FR ST (SET/KITS/TRAYS/PACK) IMPLANT
TUBE SUCT ARGYLE STRL (TUBING) ×1 IMPLANT
YANKAUER SUCT BULB TIP NO VENT (SUCTIONS) ×1 IMPLANT

## 2023-10-19 NOTE — Evaluation (Signed)
 Physical Therapy Evaluation Patient Details Name: Thomas Mcneil MRN: [id number] DOB: [birth year] Today's Date: 10/19/2023  History of Present Illness  Pt is 73 year old presented to [facility] on  10/19/23 for rt THR. PMH - copd, HTN, arthritis, peripheral neuropathy, tremor  Clinical Impression  Pt admitted with above diagnosis and presents to PT with functional limitations due to deficits listed below (See PT problem list). Pt needs skilled PT to maximize independence and safety. Pt lives with 51 year old mother and provides some of her care. Will need to be modified independent prior to return home. Expect he will make steady progress with mobility.           If plan is discharge home, recommend the following: A little help with walking and/or transfers;A little help with bathing/dressing/bathroom;Assistance with cooking/housework;Help with stairs or ramp for entrance;Assist for transportation   Can travel by private vehicle        Equipment Recommendations BSC/3in1  Recommendations for Other Services       Functional Status Assessment Patient has had a recent decline in their functional status and demonstrates the ability to make significant improvements in function in a reasonable and predictable amount of time.     Precautions / Restrictions Precautions Precautions: Fall Recall of Precautions/Restrictions: Intact Restrictions Weight Bearing Restrictions Per Provider Order: Yes RLE Weight Bearing Per Provider Order: Weight bearing as tolerated      Mobility  Bed Mobility Overal bed mobility: Needs Assistance Bed Mobility: Supine to Sit     Supine to sit: Min assist     General bed mobility comments: Assist to bring RLE off of the bed and elevate trunk into sitting    Transfers Overall transfer level: Needs assistance Equipment used: Rollator (4 wheels) Transfers: Sit to/from Stand, Bed to chair/wheelchair/BSC Sit to Stand: Contact guard assist, From elevated surface    Step pivot transfers: Contact guard assist       General transfer comment: Assist for safety    Ambulation/Gait Ambulation/Gait assistance: Contact guard assist Gait Distance (Feet): 25 Feet Assistive device: Rollator (4 wheels) Gait Pattern/deviations: Step-through pattern, Decreased stance time - right, Decreased step length - right, Decreased step length - left, Narrow base of support Gait velocity: decr Gait velocity interpretation: 1.31 - 2.62 ft/sec, indicative of limited community ambulator   General Gait Details: Assist for safety  Stairs            Wheelchair Mobility     Tilt Bed    Modified Rankin (Stroke Patients Only)       Balance Overall balance assessment: Needs assistance Sitting-balance support: No upper extremity supported, Feet supported Sitting balance-Leahy Scale: Good     Standing balance support: Bilateral upper extremity supported, During functional activity Standing balance-Leahy Scale: Poor Standing balance comment: rollator and CGA for static standing                             Pertinent Vitals/Pain Pain Assessment Pain Assessment: Faces Faces Pain Scale: Hurts a little bit Pain Location: rt hip and abdomen Pain Descriptors / Indicators: Aching Pain Intervention(s): Limited activity within patient's tolerance, Monitored during session, Repositioned    Home Living Family/patient expects to be discharged to:: Private residence Living Arrangements: Parent (32 year old mother) Available Help at Discharge: Personal care attendant;Available PRN/intermittently Type of Home: House Home Access: Ramped entrance       Home Layout: One level Home Equipment: Rollator (4 wheels);Shower seat  Prior Function Prior Level of Function : Independent/Modified Independent             Mobility Comments: Modified independent with rollator ADLs Comments: reports he can perform his own bathing and dressing      Extremity/Trunk Assessment   Upper Extremity Assessment Upper Extremity Assessment: Overall WFL for tasks assessed    Lower Extremity Assessment Lower Extremity Assessment: RLE deficits/detail;LLE deficits/detail RLE Deficits / Details: Limited by post op soreness LLE Deficits / Details: Limited by arthritic knee       Communication   Communication Communication: No apparent difficulties    Cognition Arousal: Alert Behavior During Therapy: WFL for tasks assessed/performed   PT - Cognitive impairments: No apparent impairments                         Following commands: Intact       Cueing Cueing Techniques: Verbal cues, Tactile cues     General Comments      Exercises     Assessment/Plan    PT Assessment Patient needs continued PT services  PT Problem List Decreased strength;Decreased balance;Decreased mobility;Pain       PT Treatment Interventions DME instruction;Gait training;Functional mobility training;Therapeutic activities;Therapeutic exercise;Balance training;Patient/family education    PT Goals (Current goals can be found in the Care Plan section)  Acute Rehab PT Goals Patient Stated Goal: return home PT Goal Formulation: With patient Time For Goal Achievement: 10/26/23 Potential to Achieve Goals: Good    Frequency 7X/week     Co-evaluation               AM-PAC PT "6 Clicks" Mobility  Outcome Measure Help needed turning from your back to your side while in a flat bed without using bedrails?: A Little Help needed moving from lying on your back to sitting on the side of a flat bed without using bedrails?: A Little Help needed moving to and from a bed to a chair (including a wheelchair)?: A Little Help needed standing up from a chair using your arms (e.g., wheelchair or bedside chair)?: A Little Help needed to walk in hospital room?: A Little Help needed climbing 3-5 steps with a railing? : A Lot 6 Click Score: 17    End of  Session Equipment Utilized During Treatment: Gait belt Activity Tolerance: Patient tolerated treatment well Patient left: in chair;with call bell/phone within reach;with chair alarm set Nurse Communication: Mobility status (nurse present) PT Visit Diagnosis: Other abnormalities of gait and mobility (R26.89);Muscle weakness (generalized) (M62.81);Pain;Difficulty in walking, not elsewhere classified (R26.2) Pain - Right/Left: Right Pain - part of body: Hip    Time: 1610-9604 PT Time Calculation (min) (ACUTE ONLY): 25 min   Charges:   PT Evaluation $PT Eval Moderate Complexity: 1 Mod PT Treatments $Gait Training: 8-22 mins PT General Charges $$ ACUTE PT VISIT: 1 Visit         [facility] PT Acute Rehabilitation Services Office [phone]   Pura Browns [facility] 10/19/2023, 6:20 PM

## 2023-10-19 NOTE — H&P (Signed)
 PREOPERATIVE H&P  Chief Complaint: right hip osteoarthritis  HPI: Thomas Mcneil is a 73 y.o. male who presents for surgical treatment of right hip osteoarthritis.  He denies any changes in medical history.  Past Surgical History:  Procedure Laterality Date   DIRECT LARYNGOSCOPY  12/26/2011   Procedure: DIRECT LARYNGOSCOPY;  Surgeon: Littie Rife, MD;  Location: [facility] OR;  Service: ENT;  Laterality: N/A;  Directy Laryngoscopy with biopsy [id number]   DIRECT LARYNGOSCOPY N/A 10/21/2012   Procedure: DIRECT LARYNGOSCOPY;  Surgeon: Littie Rife, MD;  Location: [facility] OR;  Service: ENT;  Laterality: N/A;   ESOPHAGOGASTRODUODENOSCOPY N/A 10/25/2012   Procedure: ESOPHAGOGASTRODUODENOSCOPY (EGD);  Surgeon: Tami Falcon, MD;  Location: [facility] ENDOSCOPY;  Service: Endoscopy;  Laterality: N/A;   TONSILLECTOMY     Social History   Socioeconomic History   Marital status: Widowed    Spouse name: Not on file   Number of children: Not on file   Years of education: Not on file   Highest education level: Not on file  Occupational History   Not on file  Tobacco Use   Smoking status: Some Days    Current packs/day: 0.25    Average packs/day: 0.3 packs/day for 45.8 years (11.4 ttl pk-yrs)    Types: Cigarettes    Start date: 06/09/2012   Smokeless tobacco: Never   Tobacco comments:    quitting, using e-cig    05/12/2022- smokes on occasion  Vaping Use   Vaping status: Former  Substance and Sexual Activity   Alcohol use: Not Currently    Alcohol/week: 70.0 standard drinks of alcohol    Types: 70 Cans of beer per week    Comment: [place] - currently in [facility] for counseling/rehab. last drink 06/29/14   Drug use: Not Currently    Frequency: 0.2 times per week    Types: "Crack" cocaine    Comment: "Crack" quit 06/29/14   Sexual activity: Not on file  Other Topics Concern   Not on file  Social History Narrative   Are you right handed or left handed? Right Handed    Are you currently employed ? No    What is  your current occupation?   Do you live at home alone? No    Who lives with you? Lives with mother    What type of home do you live in: 1 story or 2 story? Lives in a one story home        Social Drivers of Health   Financial Resource Strain: Not on file  Food Insecurity: Not on file  Transportation Needs: Not on file  Physical Activity: Not on file  Stress: Not on file  Social Connections: Unknown (10/22/2021)   Received from [facility]   Social Network    Social Network: Not on file   Family History  Problem Relation Age of Onset   Hypertension Mother    Diverticulosis Mother    GER disease Mother    Lung disease Sister    Polymyositis Sister    Alcohol abuse Maternal Uncle    No Known Allergies Prior to Admission medications   Medication Sig Start Date End Date Taking? Authorizing Provider  acetaminophen  (TYLENOL ) 500 MG tablet Take 1,000 mg by mouth in the morning, at noon, and at bedtime.   Yes [provider]  Ascorbic Acid  (VITAMIN C ) 1000 MG tablet Take 1 tablet (1,000 mg total) by mouth daily. For Vitamin C  supplementation 09/13/15  Yes Donnis Galeazzi, NP  cyanocobalamin (VITAMIN B12) 500 MCG tablet Take 2 tablets by mouth every morning. 09/20/15  Yes [provider]  DULoxetine (CYMBALTA) 60 MG capsule Take 60 mg by mouth 2 (two) times daily. Take one capsule by mouth twice a day. 02/19/22  Yes [provider]  ferrous sulfate 325 (65 FE) MG EC tablet Take 325 mg by mouth daily with breakfast.   Yes [provider]  fluticasone  (FLONASE ) 50 MCG/ACT nasal spray Place 2 sprays into both nostrils daily as needed for allergies. For allergies Patient taking differently: Place 2 sprays into both nostrils daily as needed for allergies. 09/13/15  Yes Asuncion Layer I, NP  lovastatin  (MEVACOR ) 20 MG tablet Take 1 tablet (20 mg total) by mouth at bedtime. For High Cholesterol 09/13/15  Yes Asuncion Layer I, NP  Magnesium  Citrate 200 MG  TABS Take 1 tablet by mouth daily at 12 noon.   Yes [provider]  Misc Natural Products (COMPLETE PROSTATE HEALTH) TABS Take 1 tablet by mouth in the morning and at bedtime. Vitamin d, Calcium , phosphorus, zinc, selenium, copper, manganeses, chromium, molybdate, beta-sitosterol, silica, boron   Yes [provider]  Multiple Vitamin (MULTIVITAMIN WITH MINERALS) TABS tablet Take 1 tablet by mouth daily.   Yes [provider]  pantoprazole  (PROTONIX ) 40 MG tablet Take 40 mg by mouth 2 (two) times daily.   Yes [provider]  POTASSIUM BICARBONATE PO Take 500 mg by mouth daily at 12 noon.   Yes [provider]  pregabalin  (LYRICA ) 225 MG capsule Take 225 mg by mouth 2 (two) times daily.   Yes [provider]  senna (SENOKOT) 8.6 MG TABS tablet Take 2 tablets by mouth daily.   Yes [provider]  traZODone  (DESYREL ) 100 MG tablet Take 2 tablets (200 mg total) by mouth at bedtime. For sleep Patient taking differently: Take 100 mg by mouth at bedtime. For sleep 09/13/15  Yes Asuncion Layer I, NP  amLODipine (NORVASC) 5 MG tablet Take 1 tablet by mouth daily. 07/28/23   [provider]  apixaban  (ELIQUIS ) 2.5 MG TABS tablet Take 1 tablet (2.5 mg total) by mouth 2 (two) times daily for 30 days after surgery to prevent blood clots 10/19/23   Sandie Cross, PA-C  furosemide (LASIX) 20 MG tablet TAKE 1 TABLET BY MOUTH ONCE A DAY AS NEEDED FOR LEG SWELLING Patient not taking: Reported on 01/01/2023    [provider]  loratadine (CLARITIN) 10 MG tablet Take 10 mg by mouth daily.    [provider]  methocarbamol  (ROBAXIN -750) 750 MG tablet Take 1 tablet (750 mg total) by mouth 3 (three) times daily as needed for muscle spasms. Patient not taking: Reported on 10/08/2023 10/08/23   Sandie Cross, PA-C  ondansetron  (ZOFRAN ) 4 MG tablet Take 1 tablet (4 mg total) by mouth every 8 (eight) hours as needed for nausea or  vomiting. Patient not taking: Reported on 10/08/2023 10/08/23   Sandie Cross, PA-C  oxyCODONE -acetaminophen  (PERCOCET) 5-325 MG tablet Take 1-2 tablets by mouth every 6 (six) hours as needed. To be taken after surgery Patient not taking: Reported on 10/08/2023 10/08/23   Sandie Cross, PA-C     Positive ROS: All other systems have been reviewed and were otherwise negative with the exception of those mentioned in the HPI and as above.  Physical Exam: General: Alert, no acute distress Cardiovascular: No pedal edema Respiratory: No cyanosis, no use of accessory musculature GI: abdomen soft Skin: No lesions  in the area of chief complaint Neurologic: Sensation intact distally Psychiatric: Patient is competent for consent with normal mood and affect Lymphatic: no lymphedema  MUSCULOSKELETAL: exam stable  Assessment: right hip osteoarthritis  Plan: Plan for Procedure(s): ARTHROPLASTY, HIP, TOTAL, ANTERIOR APPROACH  The risks benefits and alternatives were discussed with the patient including but not limited to the risks of nonoperative treatment, versus surgical intervention including infection, bleeding, nerve injury,  blood clots, cardiopulmonary complications, morbidity, mortality, among others, and they were willing to proceed.   Claria Crofts, MD 10/19/2023 9:16 AM

## 2023-10-19 NOTE — Op Note (Signed)
 ARTHROPLASTY, HIP, TOTAL, ANTERIOR APPROACH  Procedure Note Thomas Mcneil   [id number]  Pre-op Diagnosis: right hip osteoarthritis     Post-op Diagnosis: same  Operative Findings Leg length discrepancy Complete loss of articular cartilage    Operative Procedures  1. Total hip replacement; Right hip; uncemented [id number]   Surgeon: Dyana Glade, M.D.  Assist: Sharran Decent, PA-C   Anesthesia: spinal  Prosthesis: Depuy Acetabulum: Pinnacle 54 mm Femur: Actis 6 HO Head: 36 mm size: +1.5 Liner: +4 Bearing Type: ceramic/poly  Total Hip Arthroplasty (Anterior Approach) Op Note:  After informed consent was obtained and the operative extremity marked in the holding area, the patient was brought back to the operating room and placed supine on the HANA table. Next, the operative extremity was prepped and draped in normal sterile fashion. Surgical timeout occurred verifying patient identification, surgical site, surgical procedure and administration of antibiotics.  A 10 cm longitudinal incision was made starting from 2 fingerbreadths lateral and inferior to the ASIS towards the lateral aspect of the patella.  A Hueter approach to the hip was performed, using the interval between tensor fascia lata and sartorius.  Dissection was carried bluntly down onto the anterior hip capsule. The lateral femoral circumflex vessels were identified and coagulated. An inverted T capsulotomy was performed and the capsular flaps tagged for later repair.  The neck osteotomy was performed 1 fingerbreadth above the lesser trochanter. The femoral head was removed which showed severe wear, the acetabular rim was cleared of soft tissue and osteophytes and attention was turned to reaming the acetabulum.  Sequential reaming was performed under fluoroscopic guidance down to the floor of the cotyloid fossa. We reamed to a size 53 mm, and then impacted the acetabular shell.  A +4 liner was then placed after  irrigation and attention turned to the femur.  After placing the femoral hook, the leg was taken to externally rotated, extended and adducted position taking care to perform soft tissue releases to allow for adequate mobilization of the femur.  Pubofemoral ligaments released.  Soft tissue was cleared from the shoulder of the greater trochanter and the hook elevator used to improve exposure of the proximal femur. Sequential broaching performed up to a size 6.  High offset trial neck and +1.5 head were placed. The leg was brought back up to neutral and the construct reduced.  The position and sizing of components, offset and leg lengths were checked using fluoroscopy. Stability of the construct was checked in 45 degrees of hip extension and 90 degrees of external rotation without any subluxation, shuck or impingement of prosthesis. We dislocated the prosthesis, dropped the leg back into position, removed trial components, and irrigated copiously. The final stem and head was then placed, the leg brought back up, the system reduced and fluoroscopy used to verify positioning.  Antibiotic irrigation was placed in the surgical wound.   We irrigated, obtained hemostasis and closed the capsule using #2 ethibond suture.  A topical mixture of 0.25% bupivacaine and meloxicam was placed deep to the fascia.  One gram of vancomycin powder was placed in the surgical bed.   One gram of topical tranexamic acid was injected into the joint.  The fascia was closed with #1 stratafix, the deep fat layer was closed with 0 vicryl, the subcutaneous layers closed with 2.0 Vicryl Plus and the skin closed with 2.0 nylon and dermabond. A sterile dressing was applied. The patient was awakened in the operating room and taken to recovery in  stable condition.  All sponge, needle, and instrument counts were correct at the end of the case.   Trellis Fries, my PA, was a medical necessity for opening, closing, limb positioning, retracting,  exposing, and overall facilitation and timely completion of the surgery.  Position: supine  Complications: see description of procedure.  Time Out: performed   Drains/Packing: none  Estimated blood loss: see anesthesia record  Returned to Recovery Room: in good condition.   Antibiotics: yes   Mechanical VTE (DVT) Prophylaxis: sequential compression devices, TED thigh-high  Chemical VTE (DVT) Prophylaxis: eliquis  POD 1   Fluid Replacement: see anesthesia record  Specimens Removed: 1 to pathology   Sponge and Instrument Count Correct? yes   PACU: portable radiograph - low AP   Plan/RTC: Return in 2 weeks for staple removal. Weight Bearing/Load Lower Extremity: full  Hip precautions: none Suture Removal: 2 weeks   N. Claria Crofts, MD [facility] 12:59 PM   Implant Name Type Inv. Item Serial No. Manufacturer Lot No. LRB No. Used Action  CUP ACETAB W/GRIPTION 54 - [id number] Plate CUP ACETAB W/GRIPTION 54  [employer]  Right 1 Implanted  LINER NEUTRAL 54X36MM PLUS 4 - [id number] Hips LINER NEUTRAL 54X36MM PLUS 4  [employer] [id number] Right 1 Implanted  HEAD CERAMIC DELTA 36 PLUS 1.5 - [id number] Hips HEAD CERAMIC DELTA 36 PLUS 1.5  [employer] [id number] Right 1 Implanted  STEM FEMORAL SZ6 HIGH ACTIS - [id number] Stem STEM FEMORAL SZ6 HIGH ACTIS  [employer] [id number] Right 1 Implanted

## 2023-10-19 NOTE — Discharge Instructions (Signed)

## 2023-10-19 NOTE — Transfer of Care (Signed)
 Immediate Anesthesia Transfer of Care Note  Patient: Thomas Mcneil  Procedure(s) Performed: ARTHROPLASTY, HIP, TOTAL, ANTERIOR APPROACH (Right: Hip)  Patient Location: PACU  Anesthesia Type:MAC and Spinal  Level of Consciousness: awake and sedated  Airway & Oxygen Therapy: Patient Spontanous Breathing and Patient connected to face mask oxygen  Post-op Assessment: Report given to RN and Post -op Vital signs reviewed and stable  Post vital signs: Reviewed and stable  Last Vitals:  Vitals Value Taken Time  BP 114/66 10/19/23 1326  Temp    Pulse 60 10/19/23 1328  Resp 14 10/19/23 1328  SpO2 100 % 10/19/23 1328  Vitals shown include unfiled device data.  Last Pain:  Vitals:   10/19/23 0905  TempSrc:   PainSc: 5          Complications: No notable events documented.

## 2023-10-19 NOTE — Progress Notes (Addendum)
 Receive pt from PACU in bed. Pt has belongings at bedside that include personal walker, glasses and clothes. Pt has no pain at this time able to wiggle toes on the right leg. Pt is alert and orient. Pt has been educated to the floor and call button at bedside. Pt receive menu. Pt did not have foley on arriving to the floor.

## 2023-10-19 NOTE — Plan of Care (Signed)
  Problem: Pain Managment: Goal: General experience of comfort will improve and/or be controlled Outcome: Progressing   Problem: Safety: Goal: Ability to remain free from injury will improve Outcome: Progressing   Problem: Skin Integrity: Goal: Risk for impaired skin integrity will decrease Outcome: Progressing

## 2023-10-19 NOTE — Plan of Care (Signed)
  Problem: Activity: Goal: Risk for activity intolerance will decrease Outcome: Progressing   Problem: Elimination: Goal: Will not experience complications related to urinary retention Outcome: Progressing   Problem: Pain Managment: Goal: General experience of comfort will improve and/or be controlled Outcome: Progressing   Problem: Safety: Goal: Ability to remain free from injury will improve Outcome: Progressing

## 2023-10-19 NOTE — Anesthesia Procedure Notes (Signed)
 Spinal  Patient location during procedure: OR Start time: 10/19/2023 11:28 AM End time: 10/19/2023 11:33 AM Reason for block: surgical anesthesia Staffing Performed: anesthesiologist  Anesthesiologist: Erin Havers, MD Performed by: Erin Havers, MD Authorized by: Erin Havers, MD   Preanesthetic Checklist Completed: patient identified, IV checked, risks and benefits discussed, surgical consent, monitors and equipment checked, pre-op evaluation and timeout performed Spinal Block Patient position: sitting Prep: DuraPrep and site prepped and draped Patient monitoring: continuous pulse ox and blood pressure Approach: midline Location: L3-4 Injection technique: single-shot Needle Needle type: Pencan  Needle gauge: 24 G Assessment Events: CSF return Additional Notes Functioning IV was confirmed and monitors were applied. Sterile prep and drape, including hand hygiene, mask and sterile gloves were used. The patient was positioned and the spine was prepped. The skin was anesthetized with lidocaine .  Free flow of clear CSF was obtained prior to injecting local anesthetic into the CSF.  The spinal needle aspirated freely following injection.  The needle was carefully withdrawn.  The patient tolerated the procedure well. Consent was obtained prior to procedure with all questions answered and concerns addressed. Risks including but not limited to bleeding, infection, nerve damage, paralysis, failed block, inadequate analgesia, allergic reaction, high spinal, itching and headache were discussed and the patient wished to proceed.   Gwenevere Lent, MD

## 2023-10-20 ENCOUNTER — Encounter (HOSPITAL_COMMUNITY): Payer: Self-pay | Admitting: Orthopaedic Surgery

## 2023-10-20 ENCOUNTER — Other Ambulatory Visit (HOSPITAL_COMMUNITY): Payer: Self-pay

## 2023-10-20 DIAGNOSIS — M1611 Unilateral primary osteoarthritis, right hip: Secondary | ICD-10-CM | POA: Diagnosis not present

## 2023-10-20 MED ORDER — DOXYCYCLINE HYCLATE 100 MG PO CAPS: 100.0000 mg | ORAL_CAPSULE | Freq: Two times a day (BID) | ORAL | 0 refills | Status: AC

## 2023-10-20 NOTE — Progress Notes (Signed)
 Subjective: 1 Day Post-Op Procedure(s) (LRB): ARTHROPLASTY, HIP, TOTAL, ANTERIOR APPROACH (Right) Patient reports pain as mild.    Objective: Vital signs in last 24 hours: Temp:  [97.5 F (36.4 C)-98.2 F (36.8 C)] 98.1 F (36.7 C) (05/13 0636) Pulse Rate:  [44-90] 90 (05/13 0636) Resp:  [11-20] 17 (05/13 0636) BP: (113-169)/(57-99) 135/90 (05/13 0636) SpO2:  [93 %-100 %] 93 % (05/13 0636) Weight:  [90.7 kg] 90.7 kg (05/12 0850)  Intake/Output from previous day: 05/12 0701 - 05/13 0700 In: 1577.2 [P.O.:240; I.V.:937.2; IV Piggyback:400] Out: 1900 [Urine:1700; Blood:200] Intake/Output this shift: No intake/output data recorded.  No results for input(s): "HGB" in the last 72 hours. No results for input(s): "WBC", "RBC", "HCT", "PLT" in the last 72 hours. No results for input(s): "NA", "K", "CL", "CO2", "BUN", "CREATININE", "GLUCOSE", "CALCIUM " in the last 72 hours. No results for input(s): "LABPT", "INR" in the last 72 hours.  Neurologically intact Neurovascular intact Sensation intact distally Intact pulses distally Dorsiflexion/Plantar flexion intact Incision: dressing C/D/I No cellulitis present Compartment soft   Assessment/Plan: 1 Day Post-Op Procedure(s) (LRB): ARTHROPLASTY, HIP, TOTAL, ANTERIOR APPROACH (Right) Advance diet Up with therapy D/C IV fluids Discharge home with home health once cleared by PT Currently has a condom catheter as patient tells me he has issues with incontinence.   WBAT RLE       Sandie Cross 10/20/2023, 7:35 AM

## 2023-10-20 NOTE — TOC Initial Note (Addendum)
 Transition of Care (TOC) - Initial/Assessment Note   Spoke to patient at bedside. HHPT arranged with [facility] by Dr Christiane Cowing office, patient in agreement   Orders for rolling walker and 3 in 1. Patient has his  rollator at bedside and prefers that over rolling walker. Encouraged patient to discuss with PT . If patient decides he does want a rolling walker please notify NCM. Patient has a 3 in1 at home already   Patient's sister coming to assist him at home, also has a friend who can help   Per PT patient has agreed to rolling walker . Patient going to discharge lounge . NCM asked Raechel Bulla with [employer] to deliver rolling walker to discharge lounge   Patient needs to work with PT again today. Rolling walker for home is at bedside.  Patient Details  Name: Thomas Mcneil MRN: [id number] Date of Birth: [birth year]  Transition of Care [facility]) CM/SW Contact:    Terre Ferri, RN Phone Number: 10/20/2023, 8:44 AM  Clinical Narrative:                   Expected Discharge Plan: Home w Home Health Services Barriers to Discharge:  (PT to see)   Patient Goals and CMS Choice Patient states their goals for this hospitalization and ongoing recovery are:: to return to home CMS Medicare.gov Compare Post Acute Care list provided to:: Patient Choice offered to / list presented to : Patient      Expected Discharge Plan and Services In-house Referral: NA Discharge Planning Services: CM Consult Post Acute Care Choice: Home Health Living arrangements for the past 2 months: Single Family Home Expected Discharge Date: 10/20/23               DME Arranged:  (see note)           HH Agency: [facility] Date HH Agency Contacted: 10/20/23 Time HH Agency Contacted: [phone] Representative spoke with at [facility] Agency: Renetta Carter  Prior Living Arrangements/Services Living arrangements for the past 2 months: Single Family Home Lives with:: Self Patient language and need for interpreter  reviewed:: Yes Do you feel safe going back to the place where you live?: Yes      Need for Family Participation in Patient Care: Yes (Comment) Care giver support system in place?: Yes (comment) Current home services: DME Criminal Activity/Legal Involvement Pertinent to Current Situation/Hospitalization: No - Comment as needed  Activities of Daily Living   ADL Screening (condition at time of admission) Independently performs ADLs?: No Does the patient have a NEW difficulty with bathing/dressing/toileting/self-feeding that is expected to last >3 days?: Yes (Initiates electronic notice to provider for possible OT consult) Does the patient have a NEW difficulty with getting in/out of bed, walking, or climbing stairs that is expected to last >3 days?: Yes (Initiates electronic notice to provider for possible PT consult) Does the patient have a NEW difficulty with communication that is expected to last >3 days?: No Is the patient deaf or have difficulty hearing?: No Does the patient have difficulty seeing, even when wearing glasses/contacts?: No Does the patient have difficulty concentrating, remembering, or making decisions?: No  Permission Sought/Granted   Permission granted to share information with : Yes, Verbal Permission Granted     Permission granted to share info w AGENCY: [facility]        Emotional Assessment Appearance:: Appears older than stated age Attitude/Demeanor/Rapport: Engaged Affect (typically observed): Accepting Orientation: : Oriented to Self, Oriented to Place, Oriented to  Time, Oriented to Situation Alcohol / Substance Use: Not Applicable Psych Involvement: No (comment)  Admission diagnosis:  Primary osteoarthritis of right hip [M16.11] 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] Patient Active Problem List   Diagnosis Date Noted   Status post total replacement of right hip 10/19/2023   Pressure injury of skin of left buttock 10/13/2023   Primary  osteoarthritis of right hip 09/08/2023   Cocaine dependence, binge pattern (HCC) 11/26/2015   Substance induced mood disorder (HCC) 11/26/2015   Intention tremor 11/26/2015   Alcohol use disorder, severe, dependence (HCC) 09/04/2015   Severe recurrent major depression without psychotic features (HCC) 09/04/2015   Alcohol dependence, uncomplicated (HCC) 09/01/2015   Alcohol-induced mood disorder (HCC) 09/01/2015   Cocaine abuse (HCC) 09/01/2015   Alcohol abuse 04/05/2014   Essential tremor 01/04/2014   Other and unspecified hyperlipidemia 01/04/2014   B12 deficiency 01/04/2014   Melena 10/23/2012   GERD 05/27/2010   SHOULDER PAIN, LEFT 05/21/2010   ONYCHOMYCOSIS, TOENAILS 05/09/2010   KNEE PAIN, BILATERAL 06/27/2009   LEG CRAMPS 06/27/2009   Contusion 08/18/2008   LEG EDEMA, BILATERAL 01/13/2008   ERECTILE DYSFUNCTION 10/15/2007   TOBACCO ABUSE 08/16/2007   Mononeuritis of lower limb 08/16/2007   CORNS AND CALLUSES 08/16/2007   ALCOHOL ABUSE, HX OF 08/16/2007   PCP:  Olene Berne, MD Pharmacy:   [facility] [place], [place] - [address] [place] [address] Phone: [phone] Fax: [phone]  [facility] [address] [place] [address] Phone: [phone] Fax: [phone]     Social Drivers of Health (SDOH) Social History: SDOH Screenings   Food Insecurity: No Food Insecurity (10/19/2023)  Housing: Unknown (10/19/2023)  Transportation Needs: No Transportation Needs (10/19/2023)  Utilities: Not At Risk (10/19/2023)  Depression (PHQ2-9): Low Risk  (01/01/2023)  Social Connections: Socially Isolated (10/19/2023)  Tobacco Use: High Risk (10/19/2023)   SDOH Interventions:     Readmission Risk Interventions     No data to display

## 2023-10-20 NOTE — Anesthesia Postprocedure Evaluation (Signed)
 Anesthesia Post Note  Patient: BRACYN AMICONE  Procedure(s) Performed: ARTHROPLASTY, HIP, TOTAL, ANTERIOR APPROACH (Right: Hip)     Patient location during evaluation: PACU Anesthesia Type: Spinal Level of consciousness: awake, awake and alert and oriented Pain management: pain level controlled Vital Signs Assessment: post-procedure vital signs reviewed and stable Respiratory status: spontaneous breathing, nonlabored ventilation and respiratory function stable Cardiovascular status: blood pressure returned to baseline and stable Postop Assessment: no headache, no backache, spinal receding and no apparent nausea or vomiting Anesthetic complications: no   No notable events documented.  Last Vitals:    Last Pain:                 Erin Havers

## 2023-10-20 NOTE — Discharge Summary (Signed)
 Patient ID: Thomas Mcneil MRN: [id number] DOB/AGE: 74-19-1952 74 y.o.  Admit date: 10/19/2023 Discharge date: 10/20/2023  Admission Diagnoses:  Principal Problem:   Primary osteoarthritis of right hip Active Problems:   Status post total replacement of right hip   Discharge Diagnoses:  Same  Past Medical History:  Diagnosis Date   Arthritis    Complication of anesthesia    slow to wake up after last surgery   COPD (chronic obstructive pulmonary disease) (HCC)    Depression    DVT (deep venous thrombosis) (HCC)    GERD (gastroesophageal reflux disease)    Hepatitis    hep c   High blood pressure    High cholesterol    MGUS (monoclonal gammopathy of unknown significance)    Neuropathy    Peripheral neuropathy    feet and hands    Surgeries: Procedure(s): ARTHROPLASTY, HIP, TOTAL, ANTERIOR APPROACH on 10/19/2023   Consultants:   Discharged Condition: Improved  Hospital Course: Thomas Mcneil is an 73 y.o. male who was admitted 10/19/2023 for operative treatment ofPrimary osteoarthritis of right hip. Patient has severe unremitting pain that affects sleep, daily activities, and work/hobbies. After pre-op clearance the patient was taken to the operating room on 10/19/2023 and underwent  Procedure(s): ARTHROPLASTY, HIP, TOTAL, ANTERIOR APPROACH.    Patient was given perioperative antibiotics:  Anti-infectives (From admission, onward)    Start     Dose/Rate Route Frequency Ordered Stop   10/20/23 0000  doxycycline (VIBRAMYCIN) 100 MG capsule        100 mg Oral 2 times daily 10/20/23 0737 11/03/23 2359   10/19/23 2200  doxycycline (VIBRA-TABS) tablet 100 mg        100 mg Oral 2 times daily 10/19/23 1634     10/19/23 1730  ceFAZolin  (ANCEF ) IVPB 2g/100 mL premix        2 g 200 mL/hr over 30 Minutes Intravenous Every 6 hours 10/19/23 1634 10/20/23 0003   10/19/23 1240  vancomycin (VANCOCIN) powder  Status:  Discontinued          As needed 10/19/23 1240 10/19/23 1322   10/19/23  0900  ceFAZolin  (ANCEF ) IVPB 2g/100 mL premix        2 g 200 mL/hr over 30 Minutes Intravenous On call to O.R. 10/19/23 0848 10/19/23 1145   10/19/23 0849  ceFAZolin  (ANCEF ) 2-4 GM/100ML-% IVPB       Note to Pharmacy: Gwynne Less: cabinet override      10/19/23 0849 10/19/23 1153        Patient was given sequential compression devices, early ambulation, and chemoprophylaxis to prevent DVT.  Patient benefited maximally from hospital stay and there were no complications.    Recent vital signs: Patient Vitals for the past 24 hrs:  BP Temp Temp src Pulse Resp SpO2 Height Weight  10/20/23 0636 (!) 135/90 98.1 F (36.7 C) Oral 90 17 93 % -- --  10/19/23 2348 (!) 152/93 98.2 F (36.8 C) Oral 82 18 96 % -- --  10/19/23 2041 (!) 124/57 98.1 F (36.7 C) Oral 85 18 97 % -- --  10/19/23 1641 (!) 162/99 97.8 F (36.6 C) Oral (!) 55 17 98 % -- --  10/19/23 1615 (!) 154/96 97.9 F (36.6 C) -- (!) 58 15 96 % -- --  10/19/23 1600 (!) 165/93 -- -- (!) 53 13 95 % -- --  10/19/23 1545 (!) 164/94 -- -- (!) 57 13 95 % -- --  10/19/23 1530 (!) 169/94 -- -- Thomas Mcneil  51 12 96 % -- --  10/19/23 1515 (!) 165/89 -- -- (!) 54 20 96 % -- --  10/19/23 1500 (!) 164/89 -- -- (!) 44 12 96 % -- --  10/19/23 1445 (!) 151/88 -- -- (!) 49 16 96 % -- --  10/19/23 1430 (!) 147/82 -- -- (!) 44 14 95 % -- --  10/19/23 1415 (!) 133/90 -- -- (!) 46 14 96 % -- --  10/19/23 1400 137/82 -- -- (!) 50 17 97 % -- --  10/19/23 1345 113/75 -- -- (!) 51 14 98 % -- --  10/19/23 1330 124/66 (!) 97.5 F (36.4 C) -- 61 11 100 % -- --  10/19/23 0850 (!) 150/96 97.8 F (36.6 C) Oral 82 20 96 % 6' (1.829 m) 90.7 kg     Recent laboratory studies: No results for input(s): "WBC", "HGB", "HCT", "PLT", "NA", "K", "CL", "CO2", "BUN", "CREATININE", "GLUCOSE", "INR", "CALCIUM " in the last 72 hours.  Invalid input(s): "PT", "2"   Discharge Medications:   Allergies as of 10/20/2023   No Known Allergies      Medication List     STOP  taking these medications    acetaminophen  500 MG tablet Commonly known as: TYLENOL    furosemide 20 MG tablet Commonly known as: LASIX       TAKE these medications    amLODipine 5 MG tablet Commonly known as: NORVASC Take 1 tablet by mouth daily.   apixaban  2.5 MG Tabs tablet Commonly known as: Eliquis  Take 1 tablet (2.5 mg total) by mouth 2 (two) times daily for 30 days after surgery to prevent blood clots   Complete Prostate Health Tabs Take 1 tablet by mouth in the morning and at bedtime. Vitamin d, Calcium , phosphorus, zinc, selenium, copper, manganeses, chromium, molybdate, beta-sitosterol, silica, boron   cyanocobalamin 500 MCG tablet Commonly known as: VITAMIN B12 Take 2 tablets by mouth every morning.   doxycycline 100 MG capsule Commonly known as: VIBRAMYCIN Take 1 capsule (100 mg total) by mouth 2 (two) times daily for 14 days.   DULoxetine 60 MG capsule Commonly known as: CYMBALTA Take 60 mg by mouth 2 (two) times daily. Take one capsule by mouth twice a day.   ferrous sulfate 325 (65 FE) MG EC tablet Take 325 mg by mouth daily with breakfast.   fluticasone  50 MCG/ACT nasal spray Commonly known as: FLONASE  Place 2 sprays into both nostrils daily as needed for allergies. For allergies What changed: additional instructions   loratadine 10 MG tablet Commonly known as: CLARITIN Take 10 mg by mouth daily.   lovastatin  20 MG tablet Commonly known as: MEVACOR  Take 1 tablet (20 mg total) by mouth at bedtime. For High Cholesterol   Magnesium  Citrate 200 MG Tabs Take 1 tablet by mouth daily at 12 noon.   methocarbamol  750 MG tablet Commonly known as: Robaxin -750 Take 1 tablet (750 mg total) by mouth 3 (three) times daily as needed for muscle spasms.   multivitamin with minerals Tabs tablet Take 1 tablet by mouth daily.   ondansetron  4 MG tablet Commonly known as: Zofran  Take 1 tablet (4 mg total) by mouth every 8 (eight) hours as needed for nausea or  vomiting.   oxyCODONE -acetaminophen  5-325 MG tablet Commonly known as: Percocet Take 1-2 tablets by mouth every 6 (six) hours as needed. To be taken after surgery   pantoprazole  40 MG tablet Commonly known as: PROTONIX  Take 40 mg by mouth 2 (two) times daily.   POTASSIUM BICARBONATE  PO Take 500 mg by mouth daily at 12 noon.   pregabalin  225 MG capsule Commonly known as: LYRICA  Take 225 mg by mouth 2 (two) times daily.   senna 8.6 MG Tabs tablet Commonly known as: SENOKOT Take 2 tablets by mouth daily.   traZODone  100 MG tablet Commonly known as: DESYREL  Take 2 tablets (200 mg total) by mouth at bedtime. For sleep What changed: how much to take   vitamin C  1000 MG tablet Take 1 tablet (1,000 mg total) by mouth daily. For Vitamin C  supplementation               Durable Medical Equipment  (From admission, onward)           Start     Ordered   10/19/23 1635  DME Walker rolling  Once       Question:  Patient needs a walker to treat with the following condition  Answer:  History of hip replacement   10/19/23 1634   10/19/23 1635  DME 3 n 1  Once        10/19/23 1634   10/19/23 1635  DME Bedside commode  Once       Question:  Patient needs a bedside commode to treat with the following condition  Answer:  History of hip replacement   10/19/23 1634            Diagnostic Studies: DG Pelvis Portable Result Date: 10/19/2023 CLINICAL DATA:  Status post right total hip arthroplasty. EXAM: PORTABLE PELVIS 1-2 VIEWS COMPARISON:  09/08/2023 FINDINGS: Status post right total hip arthroplasty. The femoral and acetabular components appear well seated with appropriate alignment. No acute fracture or dislocation. The left femoral head is seated within the acetabulum with mild degenerative changes of the left hip. The sacroiliac joints and pubic symphysis are anatomically aligned. Expected right hip postoperative soft tissue swelling and soft tissue air. IMPRESSION: Status post  right total hip arthroplasty.  No acute complication. Electronically Signed   By: Mannie Seek M.D.   On: 10/19/2023 17:37   DG HIP UNILAT WITH PELVIS 1V RIGHT Result Date: 10/19/2023 CLINICAL DATA:  Total right hip arthroplasty. Intraoperative fluoroscopy. EXAM: DG HIP (WITH OR WITHOUT PELVIS) 1V RIGHT COMPARISON:  Pelvis and right hip radiographs 09/08/2023 FINDINGS: Images were performed intraoperatively without the presence of a radiologist. Severe superior right femoroacetabular joint space narrowing with subchondral sclerosis, subchondral cystic change, and chronic bone erosion. The patient is undergoing total right hip arthroplasty. No hardware complication is seen. Total fluoroscopy images: 6 Total fluoroscopy time: 26 seconds Total dose: Radiation Exposure Index (as provided by the fluoroscopic device): 3.65 mGy air Kerma Please see intraoperative findings for further detail. IMPRESSION: Intraoperative fluoroscopy for total right hip arthroplasty. Electronically Signed   By: Bertina Broccoli M.D.   On: 10/19/2023 16:47   DG C-Arm 1-60 Min-No Report Result Date: 10/19/2023 Fluoroscopy was utilized by the requesting physician.  No radiographic interpretation.   DG C-Arm 1-60 Min-No Report Result Date: 10/19/2023 Fluoroscopy was utilized by the requesting physician.  No radiographic interpretation.    Disposition:      Follow-up Information     Sandie Cross, PA-C. Schedule an appointment as soon as possible for a visit in 2 week(s).   Specialty: Orthopedic Surgery Contact information: [address] [place] [address] [phone]                  Signed: Sandie Cross 10/20/2023, 7:37 AM

## 2023-10-20 NOTE — Progress Notes (Signed)
 Patient taken out of hospital via wheelchair. Sent with personal rolling walker and new front wheeled walker. All belongings sent with patient. AVS went over for second time with patient and sister.

## 2023-10-20 NOTE — Progress Notes (Addendum)
 DISCHARGE NOTE HOME Thomas Mcneil to be discharged Home per MD order. Discussed prescriptions and follow up appointments with the patient. Prescriptions given to patient; medication list explained in detail. Patient verbalized understanding.  Rolling walker and medications delived bedside  Skin clean, dry and intact without evidence of skin break down, no evidence of skin tears noted. IV catheter discontinued intact. Site without signs and symptoms of complications. Dressing and pressure applied. Pt denies pain at the site currently. No complaints noted.  See LDA for disharge incision right hip  An After Visit Summary (AVS) was printed and given to the patient.  Floor will finish discharge when family arrives   [name], RN

## 2023-10-20 NOTE — Progress Notes (Signed)
 Physical Therapy Treatment Patient Details Name: Thomas Mcneil MRN: [id number] DOB: [birth year] Today's Date: 10/20/2023   History of Present Illness Pt is 73 year old presented to [facility] on  10/19/23 for rt THR. PMH - copd, HTN, arthritis, peripheral neuropathy, tremor    PT Comments  Pt received sitting in the recliner and agreeable to session. Pt able to stand from lower surface with CGA and cues for anterior weight shift and hand placement. Pt able to complete bed mobility in flat bed with no rails to simulate home environment with increased time and effort for RLE management. Pt able to slightly increase gait distance this session with improved step length. Pt provided gait belt and education provided to pt and pt's sister on safe guarding and assist. Education provided on activity progression and safe home navigation. Anticipate pt and family will be able to manage pt's mobility needs at home once medically ready for discharge.     If plan is discharge home, recommend the following: A little help with walking and/or transfers;A little help with bathing/dressing/bathroom;Assistance with cooking/housework;Help with stairs or ramp for entrance;Assist for transportation   Can travel by private vehicle        Equipment Recommendations  BSC/3in1;Rolling walker (2 wheels)    Recommendations for Other Services       Precautions / Restrictions Precautions Precautions: Fall Recall of Precautions/Restrictions: Intact Restrictions Weight Bearing Restrictions Per Provider Order: Yes RLE Weight Bearing Per Provider Order: Weight bearing as tolerated     Mobility  Bed Mobility Overal bed mobility: Needs Assistance Bed Mobility: Supine to Sit, Sit to Supine     Supine to sit: Contact guard Sit to supine: Contact guard assist   General bed mobility comments: Increased time/effort and use of gait belt to manage RLE with cues for technique. Flat bed and no handrails to simulate home  environment    Transfers Overall transfer level: Needs assistance Equipment used: Rolling walker (2 wheels) Transfers: Sit to/from Stand Sit to Stand: Contact guard assist   Step pivot transfers: Contact guard assist       General transfer comment: From recliner and EOB with CGA for safety and cues for hand and RLE placement    Ambulation/Gait Ambulation/Gait assistance: Contact guard assist Gait Distance (Feet): 60 Feet Assistive device: Rolling walker (2 wheels) Gait Pattern/deviations: Step-through pattern, Decreased stance time - right, Decreased step length - right, Decreased step length - left, Step-to pattern, Antalgic Gait velocity: decr   Pre-gait activities: weight shifting and marching at EOB General Gait Details: slow step-through pattern with cues for increased step length and upright posture. Some instability due to tremors, but no LOB. CGA for safety       Balance Overall balance assessment: Needs assistance Sitting-balance support: No upper extremity supported, Feet supported Sitting balance-Leahy Scale: Good Sitting balance - Comments: sitting EOB   Standing balance support: Bilateral upper extremity supported, During functional activity, Reliant on assistive device for balance Standing balance-Leahy Scale: Poor Standing balance comment: with RW support                            Communication Communication Communication: No apparent difficulties  Cognition Arousal: Alert Behavior During Therapy: WFL for tasks assessed/performed   PT - Cognitive impairments: No apparent impairments                         Following commands: Intact  Cueing Cueing Techniques: Verbal cues  Exercises      General Comments        Pertinent Vitals/Pain Pain Assessment Pain Assessment: 0-10 Pain Score: 4  Pain Location: R hip Pain Descriptors / Indicators: Aching, Guarding Pain Intervention(s): Limited activity within patient's  tolerance, Monitored during session, Repositioned     PT Goals (current goals can now be found in the care plan section) Acute Rehab PT Goals Patient Stated Goal: return home PT Goal Formulation: With patient Time For Goal Achievement: 10/26/23 Progress towards PT goals: Progressing toward goals    Frequency    7X/week       AM-PAC PT "6 Clicks" Mobility   Outcome Measure  Help needed turning from your back to your side while in a flat bed without using bedrails?: A Little Help needed moving from lying on your back to sitting on the side of a flat bed without using bedrails?: A Little Help needed moving to and from a bed to a chair (including a wheelchair)?: A Little Help needed standing up from a chair using your arms (e.g., wheelchair or bedside chair)?: A Little Help needed to walk in hospital room?: A Little Help needed climbing 3-5 steps with a railing? : A Lot 6 Click Score: 17    End of Session Equipment Utilized During Treatment: Gait belt Activity Tolerance: Patient tolerated treatment well Patient left: in bed;with call bell/phone within reach Nurse Communication: Mobility status PT Visit Diagnosis: Other abnormalities of gait and mobility (R26.89);Muscle weakness (generalized) (M62.81);Pain;Difficulty in walking, not elsewhere classified (R26.2)     Time: 2956-2130 PT Time Calculation (min) (ACUTE ONLY): 30 min  Charges:    $Gait Training: 8-22 mins $Therapeutic Activity: 8-22 mins PT General Charges $$ ACUTE PT VISIT: 1 Visit                     Michaelle Adolphus, PTA Acute Rehabilitation Services Secure Chat Preferred  [phone]    Michaelle Adolphus 10/20/2023, 1:35 PM

## 2023-10-20 NOTE — Progress Notes (Signed)
 Physical Therapy Treatment Patient Details Name: Thomas Mcneil MRN: [id number] DOB: December 20, 1950 Today's Date: 10/20/2023   History of Present Illness Pt is 73 year old presented to [facility] on  10/19/23 for rt THR. PMH - copd, HTN, arthritis, peripheral neuropathy, tremor    PT Comments  Pt received in supine and agreeable to session. Pt demonstrates increased difficulty advancing RLE towards EOB requiring min A despite pt's attempt to use a gait belt to assist RLE. Pt able to stand from EOB and BSC with CGA and increased time with pt demonstrating good carryover of safe hand placement from previous session. Pt reports increased stability with RW and is able to increase step length with cues and progressed distance. Education provided on importance of increased mobility to reduce stiffness. Pt reports his sister will come stay with him for a week at discharge to assist as needed. Pt will benefit from additional session this afternoon to progress bed mobility and ambulation. Pt continues to benefit from PT services to progress toward functional mobility goals.    If plan is discharge home, recommend the following: A little help with walking and/or transfers;A little help with bathing/dressing/bathroom;Assistance with cooking/housework;Help with stairs or ramp for entrance;Assist for transportation   Can travel by private vehicle        Equipment Recommendations  BSC/3in1;Rolling walker (2 wheels)    Recommendations for Other Services       Precautions / Restrictions Precautions Precautions: Fall Recall of Precautions/Restrictions: Intact Restrictions Weight Bearing Restrictions Per Provider Order: Yes RLE Weight Bearing Per Provider Order: Weight bearing as tolerated     Mobility  Bed Mobility Overal bed mobility: Needs Assistance Bed Mobility: Supine to Sit     Supine to sit: Min assist     General bed mobility comments: Min A to elevate RLE for pt to advance towards EOB. Pt educated  on use of gait belt for RLE, however pt unable to advance RLE without assist    Transfers Overall transfer level: Needs assistance Equipment used: Rolling walker (2 wheels) Transfers: Sit to/from Stand, Bed to chair/wheelchair/BSC Sit to Stand: Contact guard assist, From elevated surface   Step pivot transfers: Contact guard assist       General transfer comment: STS from EOB and BSC with CGA for safety    Ambulation/Gait Ambulation/Gait assistance: Contact guard assist Gait Distance (Feet): 50 Feet Assistive device: Rolling walker (2 wheels) Gait Pattern/deviations: Step-through pattern, Decreased stance time - right, Decreased step length - right, Decreased step length - left, Step-to pattern, Antalgic Gait velocity: decr   Pre-gait activities: weight shifting and marching at EOB General Gait Details: Slow step-to progressing to step-through with cues for increased step length and upright posture. Heavy reliance on BUE support to offload     Balance Overall balance assessment: Needs assistance Sitting-balance support: No upper extremity supported, Feet supported Sitting balance-Leahy Scale: Good Sitting balance - Comments: sitting EOB   Standing balance support: Bilateral upper extremity supported, During functional activity, Reliant on assistive device for balance Standing balance-Leahy Scale: Poor Standing balance comment: with RW support                            Communication Communication Communication: No apparent difficulties  Cognition Arousal: Alert Behavior During Therapy: WFL for tasks assessed/performed   PT - Cognitive impairments: No apparent impairments  Following commands: Intact      Cueing Cueing Techniques: Verbal cues  Exercises      General Comments        Pertinent Vitals/Pain Pain Assessment Pain Assessment: 0-10 Pain Score: 4  Pain Location: R hip Pain Descriptors / Indicators:  Aching, Guarding Pain Intervention(s): Limited activity within patient's tolerance, Monitored during session     PT Goals (current goals can now be found in the care plan section) Acute Rehab PT Goals Patient Stated Goal: return home PT Goal Formulation: With patient Time For Goal Achievement: 10/26/23 Progress towards PT goals: Progressing toward goals    Frequency    7X/week       AM-PAC PT "6 Clicks" Mobility   Outcome Measure  Help needed turning from your back to your side while in a flat bed without using bedrails?: A Little Help needed moving from lying on your back to sitting on the side of a flat bed without using bedrails?: A Little Help needed moving to and from a bed to a chair (including a wheelchair)?: A Little Help needed standing up from a chair using your arms (e.g., wheelchair or bedside chair)?: A Little Help needed to walk in hospital room?: A Little Help needed climbing 3-5 steps with a railing? : A Lot 6 Click Score: 17    End of Session Equipment Utilized During Treatment: Gait belt Activity Tolerance: Patient tolerated treatment well Patient left: in chair;with call bell/phone within reach;with chair alarm set Nurse Communication: Mobility status PT Visit Diagnosis: Other abnormalities of gait and mobility (R26.89);Muscle weakness (generalized) (M62.81);Pain;Difficulty in walking, not elsewhere classified (R26.2)     Time: 4098-1191 PT Time Calculation (min) (ACUTE ONLY): 27 min  Charges:    $Gait Training: 8-22 mins $Therapeutic Activity: 8-22 mins PT General Charges $$ ACUTE PT VISIT: 1 Visit                     Michaelle Adolphus, PTA Acute Rehabilitation Services Secure Chat Preferred  [phone]    Michaelle Adolphus 10/20/2023, 9:48 AM

## 2023-10-20 NOTE — Care Management Obs Status (Signed)
 MEDICARE OBSERVATION STATUS NOTIFICATION   Patient Details  Name: Thomas Mcneil MRN: [id number] Date of Birth: 05-25-1951  Patient hand shaking but did sign best he could  Medicare Observation Status Notification Given:  Yes    Terre Ferri, RN 10/20/2023, 8:39 AM

## 2023-10-26 ENCOUNTER — Encounter: Admitting: Physical Therapy

## 2023-10-30 ENCOUNTER — Encounter: Admitting: Physical Therapy

## 2023-11-03 ENCOUNTER — Encounter: Admitting: Physical Therapy

## 2023-11-03 ENCOUNTER — Ambulatory Visit (INDEPENDENT_AMBULATORY_CARE_PROVIDER_SITE_OTHER): Admitting: Physician Assistant

## 2023-11-03 DIAGNOSIS — Z96641 Presence of right artificial hip joint: Secondary | ICD-10-CM

## 2023-11-03 NOTE — Progress Notes (Signed)
 Post-Op Visit Note   Patient: Thomas Mcneil           Date of Birth: 1950/11/14           MRN: [id number] Visit Date: 11/03/2023 PCP: Olene Berne, MD   Assessment & Plan:  Chief Complaint:  Chief Complaint  Patient presents with   Right Hip - Routine Post Op   Visit Diagnoses:  1. Status post total replacement of right hip     Plan: Patient is a pleasant 73 year old gentleman who comes in today 2 weeks status post right total hip replacement  10/19/23.  He has been doing relatively well.  He has been taking oxycodone  for pain.  He has been compliant taking Eliquis  for DVT prophylaxis.  Currently ambulating with a rollator.  Examination of the right hip reveals a well-healing surgical incision with nylon sutures in place.  No evidence of infection or cellulitis.  Calf is soft nontender.  He is neurovascularly intact distally.  Today, sutures were removed and Steri-Strips applied.  He will continue with his home exercise program.  Continue with Eliquis  for another 2 weeks and then transition to a baby aspirin twice daily for 2 weeks for DVT prophylaxis.  Follow-up in 4 weeks for repeat evaluation and AP pelvis x-rays.  Call with concerns or questions.  Follow-Up Instructions: Return in about 4 weeks (around 12/01/2023).   Orders:  No orders of the defined types were placed in this encounter.  No orders of the defined types were placed in this encounter.   Imaging: No new imaging  PMFS History: Patient Active Problem List   Diagnosis Date Noted   Status post total replacement of right hip 10/19/2023   Pressure injury of skin of left buttock 10/13/2023   Primary osteoarthritis of right hip 09/08/2023   Cocaine dependence, binge pattern (HCC) 11/26/2015   Substance induced mood disorder (HCC) 11/26/2015   Intention tremor 11/26/2015   Alcohol use disorder, severe, dependence (HCC) 09/04/2015   Severe recurrent major depression without psychotic features (HCC) 09/04/2015    Alcohol dependence, uncomplicated (HCC) 09/01/2015   Alcohol-induced mood disorder (HCC) 09/01/2015   Cocaine abuse (HCC) 09/01/2015   Alcohol abuse 04/05/2014   Essential tremor 01/04/2014   Other and unspecified hyperlipidemia 01/04/2014   B12 deficiency 01/04/2014   Melena 10/23/2012   GERD 05/27/2010   SHOULDER PAIN, LEFT 05/21/2010   ONYCHOMYCOSIS, TOENAILS 05/09/2010   KNEE PAIN, BILATERAL 06/27/2009   LEG CRAMPS 06/27/2009   Contusion 08/18/2008   LEG EDEMA, BILATERAL 01/13/2008   ERECTILE DYSFUNCTION 10/15/2007   TOBACCO ABUSE 08/16/2007   Mononeuritis of lower limb 08/16/2007   CORNS AND CALLUSES 08/16/2007   ALCOHOL ABUSE, HX OF 08/16/2007   Past Medical History:  Diagnosis Date   Arthritis    Complication of anesthesia    slow to wake up after last surgery   COPD (chronic obstructive pulmonary disease) (HCC)    Depression    DVT (deep venous thrombosis) (HCC)    GERD (gastroesophageal reflux disease)    Hepatitis    hep c   High blood pressure    High cholesterol    MGUS (monoclonal gammopathy of unknown significance)    Neuropathy    Peripheral neuropathy    feet and hands    Family History  Problem Relation Age of Onset   Hypertension Mother    Diverticulosis Mother    GER disease Mother    Lung disease Sister    Polymyositis Sister  Alcohol abuse Maternal Uncle     Past Surgical History:  Procedure Laterality Date   DIRECT LARYNGOSCOPY  12/26/2011   Procedure: DIRECT LARYNGOSCOPY;  Surgeon: Littie Rife, MD;  Location: [facility] OR;  Service: ENT;  Laterality: N/A;  Directy Laryngoscopy with biopsy [id number]   DIRECT LARYNGOSCOPY N/A 10/21/2012   Procedure: DIRECT LARYNGOSCOPY;  Surgeon: Littie Rife, MD;  Location: [facility] OR;  Service: ENT;  Laterality: N/A;   ESOPHAGOGASTRODUODENOSCOPY N/A 10/25/2012   Procedure: ESOPHAGOGASTRODUODENOSCOPY (EGD);  Surgeon: Tami Falcon, MD;  Location: [facility] ENDOSCOPY;  Service: Endoscopy;  Laterality: N/A;   TONSILLECTOMY      TOTAL HIP ARTHROPLASTY Right 10/19/2023   Procedure: ARTHROPLASTY, HIP, TOTAL, ANTERIOR APPROACH;  Surgeon: Wes Hamman, MD;  Location: [facility] OR;  Service: Orthopedics;  Laterality: Right;   Social History   Occupational History   Not on file  Tobacco Use   Smoking status: Some Days    Current packs/day: 0.25    Average packs/day: 0.3 packs/day for 45.8 years (11.5 ttl pk-yrs)    Types: Cigarettes    Start date: 06/09/2012   Smokeless tobacco: Never   Tobacco comments:    quitting, using e-cig    05/12/2022- smokes on occasion  Vaping Use   Vaping status: Former  Substance and Sexual Activity   Alcohol use: Not Currently    Alcohol/week: 70.0 standard drinks of alcohol    Types: 70 Cans of beer per week    Comment: [employer] - currently in [facility] for counseling/rehab. last drink 06/29/14   Drug use: Not Currently    Frequency: 0.2 times per week    Types: "Crack" cocaine    Comment: "Crack" quit 06/29/14   Sexual activity: Not on file

## 2023-11-05 ENCOUNTER — Encounter: Admitting: Physical Therapy

## 2023-11-17 ENCOUNTER — Ambulatory Visit (INDEPENDENT_AMBULATORY_CARE_PROVIDER_SITE_OTHER): Admitting: Physician Assistant

## 2023-11-17 ENCOUNTER — Other Ambulatory Visit: Payer: Self-pay | Admitting: Physician Assistant

## 2023-11-17 DIAGNOSIS — Z96641 Presence of right artificial hip joint: Secondary | ICD-10-CM

## 2023-11-17 MED ORDER — DOXYCYCLINE HYCLATE 100 MG PO TABS: 100.0000 mg | ORAL_TABLET | Freq: Two times a day (BID) | ORAL | 0 refills | Status: DC

## 2023-11-17 NOTE — Progress Notes (Signed)
 Post-Op Visit Note   Patient: Thomas Mcneil           Date of Birth: 31-Jan-1951           MRN: [id number] Visit Date: 11/17/2023 PCP: Olene Berne, MD   Assessment & Plan:  Chief Complaint:  Chief Complaint  Patient presents with   Right Hip - Follow-up    Right total hip arthroplasty 10/19/2023   Visit Diagnoses:  1. Status post total replacement of right hip     Plan: Patient is a pleasant 73 year old gentleman who comes in with concerns about his right hip.  He is 4 weeks status post right total hip replacement 10/19/2023.  He has recently noticed some drainage to the right hip wound from 2 separate places.  No fevers or chills or other constitutional symptoms.  Examination of his right hip reveals 2 areas to the incision with fibrinous tissue.  No evidence of cellulitis or infection.  Painless range of motion of the hip.  He is neurovascularly intact distally.  At this point, we have recommended wet-to-dry dressings twice daily.  I have sent in doxycycline  to his pharmacy and has been instructed to start this ASAP.  He will follow-up with us  in 2 weeks for repeat evaluation and AP pelvis x-rays.  Call with concerns or questions.    Follow-Up Instructions: Return in about 2 weeks (around 12/01/2023).   Orders:  No orders of the defined types were placed in this encounter.  No orders of the defined types were placed in this encounter.   Imaging: No new imaging  PMFS History: Patient Active Problem List   Diagnosis Date Noted   Status post total replacement of right hip 10/19/2023   Pressure injury of skin of left buttock 10/13/2023   Primary osteoarthritis of right hip 09/08/2023   Cocaine dependence, binge pattern (HCC) 11/26/2015   Substance induced mood disorder (HCC) 11/26/2015   Intention tremor 11/26/2015   Alcohol use disorder, severe, dependence (HCC) 09/04/2015   Severe recurrent major depression without psychotic features (HCC) 09/04/2015   Alcohol  dependence, uncomplicated (HCC) 09/01/2015   Alcohol-induced mood disorder (HCC) 09/01/2015   Cocaine abuse (HCC) 09/01/2015   Alcohol abuse 04/05/2014   Essential tremor 01/04/2014   Other and unspecified hyperlipidemia 01/04/2014   B12 deficiency 01/04/2014   Melena 10/23/2012   GERD 05/27/2010   SHOULDER PAIN, LEFT 05/21/2010   ONYCHOMYCOSIS, TOENAILS 05/09/2010   KNEE PAIN, BILATERAL 06/27/2009   LEG CRAMPS 06/27/2009   Contusion 08/18/2008   LEG EDEMA, BILATERAL 01/13/2008   ERECTILE DYSFUNCTION 10/15/2007   TOBACCO ABUSE 08/16/2007   Mononeuritis of lower limb 08/16/2007   CORNS AND CALLUSES 08/16/2007   ALCOHOL ABUSE, HX OF 08/16/2007   Past Medical History:  Diagnosis Date   Arthritis    Complication of anesthesia    slow to wake up after last surgery   COPD (chronic obstructive pulmonary disease) (HCC)    Depression    DVT (deep venous thrombosis) (HCC)    GERD (gastroesophageal reflux disease)    Hepatitis    hep c   High blood pressure    High cholesterol    MGUS (monoclonal gammopathy of unknown significance)    Neuropathy    Peripheral neuropathy    feet and hands    Family History  Problem Relation Age of Onset   Hypertension Mother    Diverticulosis Mother    GER disease Mother    Lung disease Sister  Polymyositis Sister    Alcohol abuse Maternal Uncle     Past Surgical History:  Procedure Laterality Date   DIRECT LARYNGOSCOPY  12/26/2011   Procedure: DIRECT LARYNGOSCOPY;  Surgeon: Littie Rife, MD;  Location: [facility] OR;  Service: ENT;  Laterality: N/A;  Directy Laryngoscopy with biopsy [id number]   DIRECT LARYNGOSCOPY N/A 10/21/2012   Procedure: DIRECT LARYNGOSCOPY;  Surgeon: Littie Rife, MD;  Location: [facility] OR;  Service: ENT;  Laterality: N/A;   ESOPHAGOGASTRODUODENOSCOPY N/A 10/25/2012   Procedure: ESOPHAGOGASTRODUODENOSCOPY (EGD);  Surgeon: Tami Falcon, MD;  Location: [facility] ENDOSCOPY;  Service: Endoscopy;  Laterality: N/A;   TONSILLECTOMY     TOTAL HIP  ARTHROPLASTY Right 10/19/2023   Procedure: ARTHROPLASTY, HIP, TOTAL, ANTERIOR APPROACH;  Surgeon: Wes Hamman, MD;  Location: [facility] OR;  Service: Orthopedics;  Laterality: Right;   Social History   Occupational History   Not on file  Tobacco Use   Smoking status: Some Days    Current packs/day: 0.25    Average packs/day: 0.3 packs/day for 45.8 years (11.5 ttl pk-yrs)    Types: Cigarettes    Start date: 06/09/2012   Smokeless tobacco: Never   Tobacco comments:    quitting, using e-cig    05/12/2022- smokes on occasion  Vaping Use   Vaping status: Former  Substance and Sexual Activity   Alcohol use: Not Currently    Alcohol/week: 70.0 standard drinks of alcohol    Types: 70 Cans of beer per week    Comment: [employer] - currently in [facility] for counseling/rehab. last drink 06/29/14   Drug use: Not Currently    Frequency: 0.2 times per week    Types: "Crack" cocaine    Comment: "Crack" quit 06/29/14   Sexual activity: Not on file

## 2023-11-17 NOTE — Progress Notes (Unsigned)
 Post-Op Visit Note   Patient: Thomas Mcneil           Date of Birth: 1951-03-04           MRN: [id number] Visit Date: 11/17/2023 PCP: Olene Berne, MD   Assessment & Plan:  Chief Complaint: No chief complaint on file.  Visit Diagnoses:  1. Status post total replacement of right hip     Plan: ***  Follow-Up Instructions: No follow-ups on file.   Orders:  No orders of the defined types were placed in this encounter.  Meds ordered this encounter  Medications   doxycycline  (VIBRA -TABS) 100 MG tablet    Sig: Take 1 tablet (100 mg total) by mouth 2 (two) times daily.    Dispense:  28 tablet    Refill:  0    Imaging: No results found.  PMFS History: Patient Active Problem List   Diagnosis Date Noted   Status post total replacement of right hip 10/19/2023   Pressure injury of skin of left buttock 10/13/2023   Primary osteoarthritis of right hip 09/08/2023   Cocaine dependence, binge pattern (HCC) 11/26/2015   Substance induced mood disorder (HCC) 11/26/2015   Intention tremor 11/26/2015   Alcohol use disorder, severe, dependence (HCC) 09/04/2015   Severe recurrent major depression without psychotic features (HCC) 09/04/2015   Alcohol dependence, uncomplicated (HCC) 09/01/2015   Alcohol-induced mood disorder (HCC) 09/01/2015   Cocaine abuse (HCC) 09/01/2015   Alcohol abuse 04/05/2014   Essential tremor 01/04/2014   Other and unspecified hyperlipidemia 01/04/2014   B12 deficiency 01/04/2014   Melena 10/23/2012   GERD 05/27/2010   SHOULDER PAIN, LEFT 05/21/2010   ONYCHOMYCOSIS, TOENAILS 05/09/2010   KNEE PAIN, BILATERAL 06/27/2009   LEG CRAMPS 06/27/2009   Contusion 08/18/2008   LEG EDEMA, BILATERAL 01/13/2008   ERECTILE DYSFUNCTION 10/15/2007   TOBACCO ABUSE 08/16/2007   Mononeuritis of lower limb 08/16/2007   CORNS AND CALLUSES 08/16/2007   ALCOHOL ABUSE, HX OF 08/16/2007   Past Medical History:  Diagnosis Date   Arthritis    Complication of  anesthesia    slow to wake up after last surgery   COPD (chronic obstructive pulmonary disease) (HCC)    Depression    DVT (deep venous thrombosis) (HCC)    GERD (gastroesophageal reflux disease)    Hepatitis    hep c   High blood pressure    High cholesterol    MGUS (monoclonal gammopathy of unknown significance)    Neuropathy    Peripheral neuropathy    feet and hands    Family History  Problem Relation Age of Onset   Hypertension Mother    Diverticulosis Mother    GER disease Mother    Lung disease Sister    Polymyositis Sister    Alcohol abuse Maternal Uncle     Past Surgical History:  Procedure Laterality Date   DIRECT LARYNGOSCOPY  12/26/2011   Procedure: DIRECT LARYNGOSCOPY;  Surgeon: Littie Rife, MD;  Location: [facility] OR;  Service: ENT;  Laterality: N/A;  Directy Laryngoscopy with biopsy [id number]   DIRECT LARYNGOSCOPY N/A 10/21/2012   Procedure: DIRECT LARYNGOSCOPY;  Surgeon: Littie Rife, MD;  Location: [facility] OR;  Service: ENT;  Laterality: N/A;   ESOPHAGOGASTRODUODENOSCOPY N/A 10/25/2012   Procedure: ESOPHAGOGASTRODUODENOSCOPY (EGD);  Surgeon: Tami Falcon, MD;  Location: [facility] ENDOSCOPY;  Service: Endoscopy;  Laterality: N/A;   TONSILLECTOMY     TOTAL HIP ARTHROPLASTY Right 10/19/2023   Procedure: ARTHROPLASTY, HIP, TOTAL, ANTERIOR APPROACH;  Surgeon: Dyana Glade  M, MD;  Location: [facility] OR;  Service: Orthopedics;  Laterality: Right;   Social History   Occupational History   Not on file  Tobacco Use   Smoking status: Some Days    Current packs/day: 0.25    Average packs/day: 0.3 packs/day for 45.8 years (11.5 ttl pk-yrs)    Types: Cigarettes    Start date: 06/09/2012   Smokeless tobacco: Never   Tobacco comments:    quitting, using e-cig    05/12/2022- smokes on occasion  Vaping Use   Vaping status: Former  Substance and Sexual Activity   Alcohol use: Not Currently    Alcohol/week: 70.0 standard drinks of alcohol    Types: 70 Cans of beer per week    Comment: [employer] -  currently in [facility] for counseling/rehab. last drink 06/29/14   Drug use: Not Currently    Frequency: 0.2 times per week    Types: "Crack" cocaine    Comment: "Crack" quit 06/29/14   Sexual activity: Not on file

## 2023-11-25 ENCOUNTER — Ambulatory Visit: Admitting: Gastroenterology

## 2023-12-01 ENCOUNTER — Other Ambulatory Visit (INDEPENDENT_AMBULATORY_CARE_PROVIDER_SITE_OTHER): Payer: Self-pay

## 2023-12-01 ENCOUNTER — Ambulatory Visit (INDEPENDENT_AMBULATORY_CARE_PROVIDER_SITE_OTHER): Admitting: Physician Assistant

## 2023-12-01 DIAGNOSIS — Z96641 Presence of right artificial hip joint: Secondary | ICD-10-CM | POA: Diagnosis not present

## 2023-12-01 MED ORDER — DOXYCYCLINE HYCLATE 100 MG PO TABS: 100.0000 mg | ORAL_TABLET | Freq: Two times a day (BID) | ORAL | 0 refills | Status: DC

## 2023-12-01 NOTE — Progress Notes (Signed)
 Post-Op Visit Note   Patient: Thomas Mcneil           Date of Birth: [birth year]           MRN: [id number] Visit Date: 12/01/2023 PCP: Sigrid Deidra Fox, MD   Assessment & Plan:  Chief Complaint:  Chief Complaint  Patient presents with   Right Hip - Routine Post Op, Follow-up    10/19/2023 Right THA   Visit Diagnoses:  1. Status post total replacement of right hip     Plan: Patient is a pleasant 73 year old gentleman who comes in today approximately 6 weeks status post right total hip replacement, date of surgery 10/19/2023.  He has been doing much better in regards to pain.  He is only taking Tylenol  for this.  He has been ambulating primarily with his walker but has found himself forgetting it at times.  He is not getting any physical therapy.  He has been taking doxycycline  twice daily.  He was seen by us  2 weeks ago for wound check where he had 2 areas of fibrinous tissue.  He has been doing wet-to-dry dressings.  No fevers chills or any other systemic symptoms.  Examination of his right hip reveals painless hip flexion and logroll.  He still has 2 areas to the wound that have not completely healed.  He does have noticeable granulation tissue present.  No signs of cellulitis or infection.  He is neurovascularly intact distally.  At this point, would like for him to continue with wet-to-dry dressings twice daily.  I extended his doxycycline  for 2 weeks.  I have offered to start him in outpatient physical therapy but he has politely declined.  He will follow-up in 2 weeks for recheck.  Call with any concerns or questions in the meantime.  Follow-Up Instructions: Return in about 2 weeks (around 12/15/2023).   Orders:  Orders Placed This Encounter  Procedures   XR HIP UNILAT W OR W/O PELVIS 2-3 VIEWS RIGHT   Meds ordered this encounter  Medications   doxycycline  (VIBRA -TABS) 100 MG tablet    Sig: Take 1 tablet (100 mg total) by mouth 2 (two) times daily.    Dispense:  28 tablet     Refill:  0    Imaging: XR HIP UNILAT W OR W/O PELVIS 2-3 VIEWS RIGHT Result Date: 12/01/2023 Well-seated prosthesis without complication   PMFS History: Patient Active Problem List   Diagnosis Date Noted   Status post total replacement of right hip 10/19/2023   Pressure injury of skin of left buttock 10/13/2023   Primary osteoarthritis of right hip 09/08/2023   Cocaine dependence, binge pattern (HCC) 11/26/2015   Substance induced mood disorder (HCC) 11/26/2015   Intention tremor 11/26/2015   Alcohol use disorder, severe, dependence (HCC) 09/04/2015   Severe recurrent major depression without psychotic features (HCC) 09/04/2015   Alcohol dependence, uncomplicated (HCC) 09/01/2015   Alcohol-induced mood disorder (HCC) 09/01/2015   Cocaine abuse (HCC) 09/01/2015   Alcohol abuse 04/05/2014   Essential tremor 01/04/2014   Other and unspecified hyperlipidemia 01/04/2014   B12 deficiency 01/04/2014   Melena 10/23/2012   GERD 05/27/2010   SHOULDER PAIN, LEFT 05/21/2010   ONYCHOMYCOSIS, TOENAILS 05/09/2010   KNEE PAIN, BILATERAL 06/27/2009   LEG CRAMPS 06/27/2009   Contusion 08/18/2008   LEG EDEMA, BILATERAL 01/13/2008   ERECTILE DYSFUNCTION 10/15/2007   TOBACCO ABUSE 08/16/2007   Mononeuritis of lower limb 08/16/2007   CORNS AND CALLUSES 08/16/2007   ALCOHOL ABUSE, HX OF 08/16/2007  Past Medical History:  Diagnosis Date   Arthritis    Complication of anesthesia    slow to wake up after last surgery   COPD (chronic obstructive pulmonary disease) (HCC)    Depression    DVT (deep venous thrombosis) (HCC)    GERD (gastroesophageal reflux disease)    Hepatitis    hep c   High blood pressure    High cholesterol    MGUS (monoclonal gammopathy of unknown significance)    Neuropathy    Peripheral neuropathy    feet and hands    Family History  Problem Relation Age of Onset   Hypertension Mother    Diverticulosis Mother    GER disease Mother    Lung disease Sister     Polymyositis Sister    Alcohol abuse Maternal Uncle     Past Surgical History:  Procedure Laterality Date   DIRECT LARYNGOSCOPY  12/26/2011   Procedure: DIRECT LARYNGOSCOPY;  Surgeon: Merilee Kraft, MD;  Location: [facility] OR;  Service: ENT;  Laterality: N/A;  Directy Laryngoscopy with biopsy [id number]   DIRECT LARYNGOSCOPY N/A 10/21/2012   Procedure: DIRECT LARYNGOSCOPY;  Surgeon: Merilee Kraft, MD;  Location: [facility] OR;  Service: ENT;  Laterality: N/A;   ESOPHAGOGASTRODUODENOSCOPY N/A 10/25/2012   Procedure: ESOPHAGOGASTRODUODENOSCOPY (EGD);  Surgeon: Renaye Sous, MD;  Location: [facility] ENDOSCOPY;  Service: Endoscopy;  Laterality: N/A;   TONSILLECTOMY     TOTAL HIP ARTHROPLASTY Right 10/19/2023   Procedure: ARTHROPLASTY, HIP, TOTAL, ANTERIOR APPROACH;  Surgeon: Jerri Kay HERO, MD;  Location: [facility] OR;  Service: Orthopedics;  Laterality: Right;   Social History   Occupational History   Not on file  Tobacco Use   Smoking status: Some Days    Current packs/day: 0.25    Average packs/day: 0.2 packs/day for 45.9 years (11.5 ttl pk-yrs)    Types: Cigarettes    Start date: 06/09/2012   Smokeless tobacco: Never   Tobacco comments:    quitting, using e-cig    05/12/2022- smokes on occasion  Vaping Use   Vaping status: Former  Substance and Sexual Activity   Alcohol use: Not Currently    Alcohol/week: 70.0 standard drinks of alcohol    Types: 70 Cans of beer per week    Comment: [employer] - currently in [facility] for counseling/rehab. last drink 06/29/14   Drug use: Not Currently    Frequency: 0.2 times per week    Types: Crack cocaine    Comment: Crack quit 06/29/14   Sexual activity: Not on file

## 2023-12-15 ENCOUNTER — Encounter: Admitting: Physician Assistant

## 2023-12-18 ENCOUNTER — Ambulatory Visit (INDEPENDENT_AMBULATORY_CARE_PROVIDER_SITE_OTHER): Admitting: Orthopaedic Surgery

## 2023-12-18 DIAGNOSIS — Z96641 Presence of right artificial hip joint: Secondary | ICD-10-CM

## 2023-12-18 NOTE — Progress Notes (Signed)
 Post-Op Visit Note   Patient: Thomas Mcneil           Date of Birth: 03-Mar-1951           MRN: [id number] Visit Date: 12/18/2023 PCP: Sigrid Deidra Fox, MD   Assessment & Plan:  Chief Complaint:  Chief Complaint  Patient presents with   Right Hip - Routine Post Op   Visit Diagnoses:  1. Status post total replacement of right hip     Plan: History of Present Illness The patient presents for postop visit.  Recheck right hip incision.  Physical Exam SKIN: Hip wound with red beefy granulation tissue, no signs of infection.  Assessment and Plan 8 weeks s/p right THA with superficial wound dehiscence Healing with granulation tissue, no infection. Expected healing in several weeks. - Provide wound care supplies, including saline. - Continue wet-to-dry dressings. - Schedule follow-up in four weeks. - Instruct to monitor wound daily and report changes.  Nicotine  dependence Negatively impacting wound healing. Smoking cessation crucial. - Advise smoking cessation to promote healing.  Follow-Up Instructions: Return in about 4 weeks (around 01/15/2024) for with lindsey.   Orders:  No orders of the defined types were placed in this encounter.  No orders of the defined types were placed in this encounter.   Imaging: No results found.  PMFS History: Patient Active Problem List   Diagnosis Date Noted   Status post total replacement of right hip 10/19/2023   Pressure injury of skin of left buttock 10/13/2023   Primary osteoarthritis of right hip 09/08/2023   Cocaine dependence, binge pattern (HCC) 11/26/2015   Substance induced mood disorder (HCC) 11/26/2015   Intention tremor 11/26/2015   Alcohol use disorder, severe, dependence (HCC) 09/04/2015   Severe recurrent major depression without psychotic features (HCC) 09/04/2015   Alcohol dependence, uncomplicated (HCC) 09/01/2015   Alcohol-induced mood disorder (HCC) 09/01/2015   Cocaine abuse (HCC) 09/01/2015   Alcohol  abuse 04/05/2014   Essential tremor 01/04/2014   Other and unspecified hyperlipidemia 01/04/2014   B12 deficiency 01/04/2014   Melena 10/23/2012   GERD 05/27/2010   SHOULDER PAIN, LEFT 05/21/2010   ONYCHOMYCOSIS, TOENAILS 05/09/2010   KNEE PAIN, BILATERAL 06/27/2009   LEG CRAMPS 06/27/2009   Contusion 08/18/2008   LEG EDEMA, BILATERAL 01/13/2008   ERECTILE DYSFUNCTION 10/15/2007   TOBACCO ABUSE 08/16/2007   Mononeuritis of lower limb 08/16/2007   CORNS AND CALLUSES 08/16/2007   ALCOHOL ABUSE, HX OF 08/16/2007   Past Medical History:  Diagnosis Date   Arthritis    Complication of anesthesia    slow to wake up after last surgery   COPD (chronic obstructive pulmonary disease) (HCC)    Depression    DVT (deep venous thrombosis) (HCC)    GERD (gastroesophageal reflux disease)    Hepatitis    hep c   High blood pressure    High cholesterol    MGUS (monoclonal gammopathy of unknown significance)    Neuropathy    Peripheral neuropathy    feet and hands    Family History  Problem Relation Age of Onset   Hypertension Mother    Diverticulosis Mother    GER disease Mother    Lung disease Sister    Polymyositis Sister    Alcohol abuse Maternal Uncle     Past Surgical History:  Procedure Laterality Date   DIRECT LARYNGOSCOPY  12/26/2011   Procedure: DIRECT LARYNGOSCOPY;  Surgeon: Merilee Kraft, MD;  Location: [facility] OR;  Service: ENT;  Laterality: N/A;  Directy Laryngoscopy with biopsy [id number]   DIRECT LARYNGOSCOPY N/A 10/21/2012   Procedure: DIRECT LARYNGOSCOPY;  Surgeon: Merilee Kraft, MD;  Location: [facility] OR;  Service: ENT;  Laterality: N/A;   ESOPHAGOGASTRODUODENOSCOPY N/A 10/25/2012   Procedure: ESOPHAGOGASTRODUODENOSCOPY (EGD);  Surgeon: Renaye Sous, MD;  Location: [facility] ENDOSCOPY;  Service: Endoscopy;  Laterality: N/A;   TONSILLECTOMY     TOTAL HIP ARTHROPLASTY Right 10/19/2023   Procedure: ARTHROPLASTY, HIP, TOTAL, ANTERIOR APPROACH;  Surgeon: Jerri Kay HERO, MD;  Location: [facility] OR;   Service: Orthopedics;  Laterality: Right;   Social History   Occupational History   Not on file  Tobacco Use   Smoking status: Some Days    Current packs/day: 0.25    Average packs/day: 0.3 packs/day for 45.9 years (11.5 ttl pk-yrs)    Types: Cigarettes    Start date: 06/09/2012   Smokeless tobacco: Never   Tobacco comments:    quitting, using e-cig    05/12/2022- smokes on occasion  Vaping Use   Vaping status: Former  Substance and Sexual Activity   Alcohol use: Not Currently    Alcohol/week: 70.0 standard drinks of alcohol    Types: 70 Cans of beer per week    Comment: [employer] - currently in [facility] for counseling/rehab. last drink 06/29/14   Drug use: Not Currently    Frequency: 0.2 times per week    Types: Crack cocaine    Comment: Crack quit 06/29/14   Sexual activity: Not on file

## 2023-12-22 ENCOUNTER — Encounter: Payer: Self-pay | Admitting: Gastroenterology

## 2024-01-15 ENCOUNTER — Ambulatory Visit: Admitting: Physician Assistant

## 2024-02-02 ENCOUNTER — Ambulatory Visit (INDEPENDENT_AMBULATORY_CARE_PROVIDER_SITE_OTHER): Admitting: Physician Assistant

## 2024-02-02 DIAGNOSIS — Z96641 Presence of right artificial hip joint: Secondary | ICD-10-CM

## 2024-02-02 NOTE — Progress Notes (Signed)
 Post-Op Visit Note   Patient: Thomas Mcneil           Date of Birth: June 16, 1950           MRN: [id number] Visit Date: 02/02/2024 PCP: Sigrid Deidra Fox, MD   Assessment & Plan:  Chief Complaint:  Chief Complaint  Patient presents with   Right Hip - Pain, Follow-up   Visit Diagnoses:  1. Status post total replacement of right hip     Plan: Patient is a pleasant 73 year old gentleman who comes in today for follow-up of his right hip.  He is 3-1/2 months status post right total hip replacement 10/19/2023.  He has been doing well.  He has finished doing wet-to-dry dressing changes due to the wound healing.  He is still ambulating with a walker due to his neuropathy.  Overall, doing great in regards to his right hip.  Examination of the right hip reveals a fully healed surgical scar without complication.  Painless hip flexion and logroll.  He is neurovascularly intact distally.  At this point, he will continue to advance with activity as tolerated.  Follow-up in 3 months for repeat evaluation and AP pelvis x-rays.  Call with concerns or questions.  Follow-Up Instructions: Return in about 3 months (around 05/04/2024).   Orders:  No orders of the defined types were placed in this encounter.  No orders of the defined types were placed in this encounter.   Imaging: No new imaging  PMFS History: Patient Active Problem List   Diagnosis Date Noted   Status post total replacement of right hip 10/19/2023   Pressure injury of skin of left buttock 10/13/2023   Primary osteoarthritis of right hip 09/08/2023   Cocaine dependence, binge pattern (HCC) 11/26/2015   Substance induced mood disorder (HCC) 11/26/2015   Intention tremor 11/26/2015   Alcohol use disorder, severe, dependence (HCC) 09/04/2015   Severe recurrent major depression without psychotic features (HCC) 09/04/2015   Alcohol dependence, uncomplicated (HCC) 09/01/2015   Alcohol-induced mood disorder (HCC) 09/01/2015    Cocaine abuse (HCC) 09/01/2015   Alcohol abuse 04/05/2014   Essential tremor 01/04/2014   Other and unspecified hyperlipidemia 01/04/2014   B12 deficiency 01/04/2014   Melena 10/23/2012   GERD 05/27/2010   SHOULDER PAIN, LEFT 05/21/2010   ONYCHOMYCOSIS, TOENAILS 05/09/2010   KNEE PAIN, BILATERAL 06/27/2009   LEG CRAMPS 06/27/2009   Contusion 08/18/2008   LEG EDEMA, BILATERAL 01/13/2008   ERECTILE DYSFUNCTION 10/15/2007   TOBACCO ABUSE 08/16/2007   Mononeuritis of lower limb 08/16/2007   CORNS AND CALLUSES 08/16/2007   ALCOHOL ABUSE, HX OF 08/16/2007   Past Medical History:  Diagnosis Date   Arthritis    Complication of anesthesia    slow to wake up after last surgery   COPD (chronic obstructive pulmonary disease) (HCC)    Depression    DVT (deep venous thrombosis) (HCC)    GERD (gastroesophageal reflux disease)    Hepatitis    hep c   High blood pressure    High cholesterol    MGUS (monoclonal gammopathy of unknown significance)    Neuropathy    Peripheral neuropathy    feet and hands    Family History  Problem Relation Age of Onset   Hypertension Mother    Diverticulosis Mother    GER disease Mother    Lung disease Sister    Polymyositis Sister    Alcohol abuse Maternal Uncle     Past Surgical History:  Procedure Laterality Date  DIRECT LARYNGOSCOPY  12/26/2011   Procedure: DIRECT LARYNGOSCOPY;  Surgeon: Merilee Kraft, MD;  Location: [facility] OR;  Service: ENT;  Laterality: N/A;  Directy Laryngoscopy with biopsy [id number]   DIRECT LARYNGOSCOPY N/A 10/21/2012   Procedure: DIRECT LARYNGOSCOPY;  Surgeon: Merilee Kraft, MD;  Location: [facility] OR;  Service: ENT;  Laterality: N/A;   ESOPHAGOGASTRODUODENOSCOPY N/A 10/25/2012   Procedure: ESOPHAGOGASTRODUODENOSCOPY (EGD);  Surgeon: Renaye Sous, MD;  Location: [facility] ENDOSCOPY;  Service: Endoscopy;  Laterality: N/A;   TONSILLECTOMY     TOTAL HIP ARTHROPLASTY Right 10/19/2023   Procedure: ARTHROPLASTY, HIP, TOTAL, ANTERIOR APPROACH;   Surgeon: Jerri Kay HERO, MD;  Location: [facility] OR;  Service: Orthopedics;  Laterality: Right;   Social History   Occupational History   Not on file  Tobacco Use   Smoking status: Some Days    Current packs/day: 0.25    Average packs/day: 0.3 packs/day for 46.0 years (11.5 ttl pk-yrs)    Types: Cigarettes    Start date: 06/09/2012   Smokeless tobacco: Never   Tobacco comments:    quitting, using e-cig    05/12/2022- smokes on occasion  Vaping Use   Vaping status: Former  Substance and Sexual Activity   Alcohol use: Not Currently    Alcohol/week: 70.0 standard drinks of alcohol    Types: 70 Cans of beer per week    Comment: [employer] - currently in [facility] for counseling/rehab. last drink 06/29/14   Drug use: Not Currently    Frequency: 0.2 times per week    Types: Crack cocaine    Comment: Crack quit 06/29/14   Sexual activity: Not on file

## 2024-02-12 ENCOUNTER — Other Ambulatory Visit (INDEPENDENT_AMBULATORY_CARE_PROVIDER_SITE_OTHER)

## 2024-02-12 ENCOUNTER — Other Ambulatory Visit: Payer: Self-pay

## 2024-02-12 ENCOUNTER — Ambulatory Visit: Admitting: Orthopaedic Surgery

## 2024-02-12 DIAGNOSIS — G8929 Other chronic pain: Secondary | ICD-10-CM

## 2024-02-12 DIAGNOSIS — Z96641 Presence of right artificial hip joint: Secondary | ICD-10-CM | POA: Diagnosis not present

## 2024-02-12 DIAGNOSIS — M1712 Unilateral primary osteoarthritis, left knee: Secondary | ICD-10-CM

## 2024-02-12 DIAGNOSIS — M25562 Pain in left knee: Secondary | ICD-10-CM

## 2024-02-12 NOTE — Progress Notes (Signed)
 Office Visit Note   Patient: Thomas Mcneil           Date of Birth: 25-Mar-1951           MRN: [id number] Visit Date: 02/12/2024              Requested by: Sigrid Deidra Fox, MD [address] [place],  [place] [address] PCP: Sigrid Deidra Fox, MD   Assessment & Plan: Visit Diagnoses:  1. Primary osteoarthritis of left knee   2. Status post total replacement of right hip     Plan: History of Present Illness Thomas Mcneil is a 73 year old male who presents for 3 months postop check and left knee osteoarthritis.  He experiences chronic pain in the left knee, which has persisted for several years. Previous treatments, including injections and gel injections, no longer provide relief. He has not received a cortisone injection in the past three months. The pain radiates down the side of the knee.  He underwent a right hip replacement over three months ago, which has healed well. He expresses fear of undergoing knee surgery and dislikes steroid injections.  Left knee exam shows mild valgus alignment.  Pain and crepitus throughout range of motion.  No motor or sensory deficits.  Lateral joint line tenderness. Right hip shows fully healed surgical scar.  Fluid painless range of motion.  Assessment and Plan 67-month status post right total hip arthroplasty. - Doing well overall.  Very pleased with the outcome.  Would like to proceed with knee surgery  2.  Impression is severe left knee degenerative joint disease secondary to Osteoarthritis.  Patient has attempted conservative treatment for at least 6 consecutive weeks within the past 12 weeks, including but not limited to physical therapy, home exercise program, NSAIDs, activity modification, and/or corticosteroid injections. Despite these efforts, symptoms have not improved or have worsened. Conservative measures have been deemed unsuccessful at this time. After a detailed discussion covering diagnosis and treatment  options--including the risks, benefits, alternatives, and potential complications of surgical and nonsurgical management--the patient elected to proceed with surgery  Anticoagulants: No antithrombotic Postop anticoagulation: Eliquis  Diabetic: No  Nickel allergy: No Prior DVT/PE: No Tobacco use: No Clearances needed for surgery: Recently obtained for THA Anticipated discharge dispo: Home  Follow-Up Instructions: No follow-ups on file.   Orders:  Orders Placed This Encounter  Procedures   XR KNEE 3 VIEW LEFT   No orders of the defined types were placed in this encounter.    Subjective: Chief Complaint  Patient presents with   Left Knee - Pain    HPI  Review of Systems  Constitutional: Negative.   HENT: Negative.    Eyes: Negative.   Respiratory: Negative.    Cardiovascular: Negative.   Gastrointestinal: Negative.   Endocrine: Negative.   Genitourinary: Negative.   Skin: Negative.   Allergic/Immunologic: Negative.   Neurological: Negative.   Hematological: Negative.   Psychiatric/Behavioral: Negative.    All other systems reviewed and are negative.    Objective: Vital Signs: There were no vitals taken for this visit.  Physical Exam Vitals and nursing note reviewed.  Constitutional:      Appearance: He is well-developed.  HENT:     Head: Normocephalic and atraumatic.  Eyes:     Pupils: Pupils are equal, round, and reactive to light.  Pulmonary:     Effort: Pulmonary effort is normal.  Abdominal:     Palpations: Abdomen is soft.  Musculoskeletal:  General: Normal range of motion.     Cervical back: Neck supple.  Skin:    General: Skin is warm.  Neurological:     Mental Status: He is alert and oriented to person, place, and time.  Psychiatric:        Behavior: Behavior normal.        Thought Content: Thought content normal.        Judgment: Judgment normal.     Ortho Exam  Specialty Comments:  No specialty comments  available.  Imaging: XR KNEE 3 VIEW LEFT Result Date: 02/12/2024 X-rays of the left knee show advanced tricompartmental osteoarthritis.  Bone-on-bone joint space narrowing.  Kellgren-Lawrence stage IV    PMFS History: Patient Active Problem List   Diagnosis Date Noted   Primary osteoarthritis of left knee 02/12/2024   Status post total replacement of right hip 10/19/2023   Pressure injury of skin of left buttock 10/13/2023   Primary osteoarthritis of right hip 09/08/2023   Cocaine dependence, binge pattern (HCC) 11/26/2015   Substance induced mood disorder (HCC) 11/26/2015   Intention tremor 11/26/2015   Alcohol use disorder, severe, dependence (HCC) 09/04/2015   Severe recurrent major depression without psychotic features (HCC) 09/04/2015   Alcohol dependence, uncomplicated (HCC) 09/01/2015   Alcohol-induced mood disorder (HCC) 09/01/2015   Cocaine abuse (HCC) 09/01/2015   Alcohol abuse 04/05/2014   Essential tremor 01/04/2014   Other and unspecified hyperlipidemia 01/04/2014   B12 deficiency 01/04/2014   Melena 10/23/2012   GERD 05/27/2010   SHOULDER PAIN, LEFT 05/21/2010   ONYCHOMYCOSIS, TOENAILS 05/09/2010   KNEE PAIN, BILATERAL 06/27/2009   LEG CRAMPS 06/27/2009   Contusion 08/18/2008   LEG EDEMA, BILATERAL 01/13/2008   ERECTILE DYSFUNCTION 10/15/2007   TOBACCO ABUSE 08/16/2007   Mononeuritis of lower limb 08/16/2007   CORNS AND CALLUSES 08/16/2007   ALCOHOL ABUSE, HX OF 08/16/2007   Past Medical History:  Diagnosis Date   Arthritis    Complication of anesthesia    slow to wake up after last surgery   COPD (chronic obstructive pulmonary disease) (HCC)    Depression    DVT (deep venous thrombosis) (HCC)    GERD (gastroesophageal reflux disease)    Hepatitis    hep c   High blood pressure    High cholesterol    MGUS (monoclonal gammopathy of unknown significance)    Neuropathy    Peripheral neuropathy    feet and hands    Family History  Problem Relation  Age of Onset   Hypertension Mother    Diverticulosis Mother    GER disease Mother    Lung disease Sister    Polymyositis Sister    Alcohol abuse Maternal Uncle     Past Surgical History:  Procedure Laterality Date   DIRECT LARYNGOSCOPY  12/26/2011   Procedure: DIRECT LARYNGOSCOPY;  Surgeon: Merilee Kraft, MD;  Location: [facility] OR;  Service: ENT;  Laterality: N/A;  Directy Laryngoscopy with biopsy [id number]   DIRECT LARYNGOSCOPY N/A 10/21/2012   Procedure: DIRECT LARYNGOSCOPY;  Surgeon: Merilee Kraft, MD;  Location: [facility] OR;  Service: ENT;  Laterality: N/A;   ESOPHAGOGASTRODUODENOSCOPY N/A 10/25/2012   Procedure: ESOPHAGOGASTRODUODENOSCOPY (EGD);  Surgeon: Renaye Sous, MD;  Location: [facility] ENDOSCOPY;  Service: Endoscopy;  Laterality: N/A;   TONSILLECTOMY     TOTAL HIP ARTHROPLASTY Right 10/19/2023   Procedure: ARTHROPLASTY, HIP, TOTAL, ANTERIOR APPROACH;  Surgeon: Jerri Kay HERO, MD;  Location: [facility] OR;  Service: Orthopedics;  Laterality: Right;   Social History   Occupational History  Not on file  Tobacco Use   Smoking status: Some Days    Current packs/day: 0.25    Average packs/day: 0.2 packs/day for 46.1 years (11.5 ttl pk-yrs)    Types: Cigarettes    Start date: 06/09/2012   Smokeless tobacco: Never   Tobacco comments:    quitting, using e-cig    05/12/2022- smokes on occasion  Vaping Use   Vaping status: Former  Substance and Sexual Activity   Alcohol use: Not Currently    Alcohol/week: 70.0 standard drinks of alcohol    Types: 70 Cans of beer per week    Comment: [employer] - currently in [facility] for counseling/rehab. last drink 06/29/14   Drug use: Not Currently    Frequency: 0.2 times per week    Types: Crack cocaine    Comment: Crack quit 06/29/14   Sexual activity: Not on file

## 2024-02-16 ENCOUNTER — Encounter: Payer: Self-pay | Admitting: Gastroenterology

## 2024-02-16 ENCOUNTER — Ambulatory Visit: Admitting: Gastroenterology

## 2024-02-16 ENCOUNTER — Other Ambulatory Visit (INDEPENDENT_AMBULATORY_CARE_PROVIDER_SITE_OTHER)

## 2024-02-16 VITALS — BP 166/100 | HR 79 | Ht 72.0 in | Wt 200.0 lb

## 2024-02-16 DIAGNOSIS — D509 Iron deficiency anemia, unspecified: Secondary | ICD-10-CM

## 2024-02-16 DIAGNOSIS — D500 Iron deficiency anemia secondary to blood loss (chronic): Secondary | ICD-10-CM | POA: Diagnosis not present

## 2024-02-16 LAB — CBC WITH DIFFERENTIAL/PLATELET
Basophils Absolute: 0.1 K/uL (ref 0.0–0.1)
Basophils Relative: 1.1 % (ref 0.0–3.0)
Eosinophils Absolute: 0.3 K/uL (ref 0.0–0.7)
Eosinophils Relative: 5.6 % — ABNORMAL HIGH (ref 0.0–5.0)
HCT: 41.3 % (ref 39.0–52.0)
Hemoglobin: 13.5 g/dL (ref 13.0–17.0)
Lymphocytes Relative: 44.8 % (ref 12.0–46.0)
Lymphs Abs: 2.5 K/uL (ref 0.7–4.0)
MCHC: 32.6 g/dL (ref 30.0–36.0)
MCV: 89 fl (ref 78.0–100.0)
Monocytes Absolute: 0.6 K/uL (ref 0.1–1.0)
Monocytes Relative: 10.3 % (ref 3.0–12.0)
Neutro Abs: 2.2 K/uL (ref 1.4–7.7)
Neutrophils Relative %: 38.2 % — ABNORMAL LOW (ref 43.0–77.0)
Platelets: 196 K/uL (ref 150.0–400.0)
RBC: 4.64 Mil/uL (ref 4.22–5.81)
RDW: 14.2 % (ref 11.5–15.5)
WBC: 5.6 K/uL (ref 4.0–10.5)

## 2024-02-16 MED ORDER — NA SULFATE-K SULFATE-MG SULF 17.5-3.13-1.6 GM/177ML PO SOLN: 1.0000 | Freq: Once | ORAL | 0 refills | Status: AC

## 2024-02-16 NOTE — Patient Instructions (Addendum)
 Your provider has requested that you go to the basement level for lab work before leaving today. Press B on the elevator. The lab is located at the first door on the left as you exit the elevator.   You have been scheduled for an endoscopy and colonoscopy. Please follow the written instructions given to you at your visit today.  If you use inhalers (even only as needed), please bring them with you on the day of your procedure.  DO NOT TAKE 7 DAYS PRIOR TO TEST- Trulicity (dulaglutide) Ozempic, Wegovy (semaglutide) Mounjaro (tirzepatide) Bydureon Bcise (exanatide extended release)  DO NOT TAKE 1 DAY PRIOR TO YOUR TEST Rybelsus (semaglutide) Adlyxin (lixisenatide) Victoza (liraglutide) Byetta (exanatide) ___________________________________________________________________________   _______________________________________________________  If your blood pressure at your visit was 140/90 or greater, please contact your primary care physician to follow up on this.  _______________________________________________________  If you are age 37 or older, your body mass index should be between 23-30. Your Body mass index is 27.12 kg/m. If this is out of the aforementioned range listed, please consider follow up with your Primary Care Provider.  If you are age 19 or younger, your body mass index should be between 19-25. Your Body mass index is 27.12 kg/m. If this is out of the aformentioned range listed, please consider follow up with your Primary Care Provider.   ________________________________________________________  The [facility] providers would like to encourage you to use MYCHART to communicate with providers for non-urgent requests or questions.  Due to long hold times on the telephone, sending your provider a message by [facility] may be a faster and more efficient way to get a response.  Please allow 48 business hours for a response.  Please remember that this is for non-urgent requests.   _______________________________________________________  [facility] is using a team-based approach to care.  Your team is made up of your doctor and two to three APPS. Our APPS (Nurse Practitioners and Physician Assistants) work with your physician to ensure care continuity for you. They are fully qualified to address your health concerns and develop a treatment plan. They communicate directly with your gastroenterologist to care for you. Seeing the Advanced Practice Practitioners on your physician's team can help you by facilitating care more promptly, often allowing for earlier appointments, access to diagnostic testing, procedures, and other specialty referrals.    Thank you for trusting me with your gastrointestinal care!    Dr. Victory Legrand DOUGLAS [facility]

## 2024-02-16 NOTE — Progress Notes (Signed)
 [facility] Consult Note:  History: Thomas Mcneil 02/16/2024  Referring provider: Sigrid Kays, [place], MD  Reason for consult/chief complaint: Colon Cancer Screening (Patient would like to schedule an endoscopy and colonoscopy today.)   Subjective  Prior history:  No prior GI care at this clinic or health system.  Referred by Bernardino Medicine, PA at the [facility] GI clinic after an office visit with them March 2025.  Patient had been referred to that [facility] GI clinic for iron deficiency anemia and GERD.  Office note copying that referral indicates patient had a colonoscopy at [facility] in 2019 with inadequate prep.  Report of that procedure not available. Patient referred out for community GI care   Discussed the use of AI scribe software for clinical note transcription with the patient, who gave verbal consent to proceed.  History of Present Illness He is a 73 year old male with anemia who presents for evaluation of potential gastrointestinal blood loss. He was referred by the GI team at the [facility] for evaluation of potential gastrointestinal blood loss.  He has been evaluated by a hematologist at the [facility] for anemia, and he recalls being told that blood loss might be a possible cause.  That appears to be the indication for his referral to the [facility] clinic.  He has been taking iron tablets regularly for this condition. No visible blood in stools, changes in bowel habits such as diarrhea or constipation, or abdominal pain.  He experiences occasional choking while eating, which he attributes to inadequate chewing. No nausea, vomiting, or unexpected weight loss. Instead, he reports gaining weight and describes his appetite as great.  His current medications include iron supplements, vitamin B12, and a blood thinner, Eliquis , which he was taking following a joint surgery to prevent clots. He undergoes blood work approximately three times a year, with the most  recent and available test occurring in February of this year.  He may have had some lab work more recently at the [facility] but if so, we do not have access to it.  He denies a family history of esophageal gastric or colon cancer. Current smoker, uses inhalers for COPD and does not require supplemental oxygen.   ROS:  Review of Systems  Constitutional:  Negative for appetite change and unexpected weight change.  HENT:  Negative for mouth sores and voice change.   Eyes:  Negative for pain and redness.  Respiratory:  Positive for shortness of breath. Negative for cough.   Cardiovascular:  Negative for chest pain and palpitations.  Genitourinary:  Negative for dysuria and hematuria.  Musculoskeletal:  Negative for arthralgias and myalgias.  Skin:  Negative for pallor and rash.  Neurological:  Negative for weakness and headaches.  Hematological:  Negative for adenopathy.   Episodic and stable dyspnea that he attributes to his COPD.    Past Medical History: Past Medical History:  Diagnosis Date   Arthritis    Complication of anesthesia    slow to wake up after last surgery   COPD (chronic obstructive pulmonary disease) (HCC)    Depression    DVT (deep venous thrombosis) (HCC)    GERD (gastroesophageal reflux disease)    Hepatitis    hep c   High blood pressure    High cholesterol    MGUS (monoclonal gammopathy of unknown significance)    Neuropathy    Peripheral neuropathy    feet and hands     Past Surgical History: Past Surgical  History:  Procedure Laterality Date   DIRECT LARYNGOSCOPY  12/26/2011   Procedure: DIRECT LARYNGOSCOPY;  Surgeon: Merilee Kraft, MD;  Location: [facility] OR;  Service: ENT;  Laterality: N/A;  Directy Laryngoscopy with biopsy [id number]   DIRECT LARYNGOSCOPY N/A 10/21/2012   Procedure: DIRECT LARYNGOSCOPY;  Surgeon: Merilee Kraft, MD;  Location[facility] OR;  Service: ENT;  Laterality: N/A;   ESOPHAGOGASTRODUODENOSCOPY N/A 10/25/2012   Procedure: ESOPHAGOGASTRODUODENOSCOPY  (EGD);  Surgeon: Renaye Sous, MD;  Location: [facility] ENDOSCOPY;  Service: Endoscopy;  Laterality: N/A;   TONSILLECTOMY     TOTAL HIP ARTHROPLASTY Right 10/19/2023   Procedure: ARTHROPLASTY, HIP, TOTAL, ANTERIOR APPROACH;  Surgeon: Jerri Kay HERO, MD;  Location: [facility] OR;  Service: Orthopedics;  Laterality: Right;     Family History: Family History  Problem Relation Age of Onset   Hypertension Mother    Diverticulosis Mother    GER disease Mother    Lung disease Sister    Polymyositis Sister    Alcohol abuse Maternal Uncle     Social History: Social History   Socioeconomic History   Marital status: Widowed    Spouse name: Not on file   Number of children: 1   Years of education: Not on file   Highest education level: Not on file  Occupational History   Not on file  Tobacco Use   Smoking status: Some Days    Current packs/day: 0.25    Average packs/day: 0.2 packs/day for 46.1 years (11.5 ttl pk-yrs)    Types: Cigarettes    Start date: 06/09/2012   Smokeless tobacco: Never   Tobacco comments:    quitting, using e-cig    05/12/2022- smokes on occasion  Vaping Use   Vaping status: Former  Substance and Sexual Activity   Alcohol use: Not Currently    Alcohol/week: 70.0 standard drinks of alcohol    Types: 70 Cans of beer per week    Comment: [place] - currently in [facility] for counseling/rehab. last drink 06/29/14   Drug use: Not Currently    Frequency: 0.2 times per week    Types: Crack cocaine    Comment: Crack quit 06/29/14   Sexual activity: Not on file  Other Topics Concern   Not on file  Social History Narrative   Are you right handed or left handed? Right Handed    Are you currently employed ? No    What is your current occupation?   Do you live at home alone? No    Who lives with you? Lives with mother    What type of home do you live in: 1 story or 2 story? Lives in a one story home        Social Drivers of Health   Financial Resource Strain: Not on file  Food  Insecurity: No Food Insecurity (10/19/2023)   Hunger Vital Sign    Worried About Running Out of Food in the Last Year: Never true    Ran Out of Food in the Last Year: Never true  Transportation Needs: No Transportation Needs (10/19/2023)   PRAPARE - Administrator, Civil Service (Medical): No    Lack of Transportation (Non-Medical): No  Physical Activity: Not on file  Stress: Not on file  Social Connections: Socially Isolated (10/19/2023)   Social Connection and Isolation Panel    Frequency of Communication with Friends and Family: Three times a week    Frequency of Social Gatherings with Friends and Family: Once a week    Attends  Religious Services: Never    Active Member of Clubs or Organizations: No    Attends Banker Meetings: Never    Marital Status: Widowed   Psychologist, educational  Allergies: No Known Allergies  Outpatient Meds: Current Outpatient Medications  Medication Sig Dispense Refill   apixaban  (ELIQUIS ) 2.5 MG TABS tablet Take 1 tablet (2.5 mg total) by mouth 2 (two) times daily for 30 days after surgery to prevent blood clots 60 tablet 0   Ascorbic Acid  (VITAMIN C ) 1000 MG tablet Take 1 tablet (1,000 mg total) by mouth daily. For Vitamin C  supplementation     cyanocobalamin (VITAMIN B12) 500 MCG tablet Take 2 tablets by mouth every morning.     DULoxetine  (CYMBALTA ) 60 MG capsule Take 60 mg by mouth 2 (two) times daily. Take one capsule by mouth twice a day.     ferrous sulfate  325 (65 FE) MG EC tablet Take 325 mg by mouth daily with breakfast.     fluticasone  (FLONASE ) 50 MCG/ACT nasal spray Place 2 sprays into both nostrils daily as needed for allergies. For allergies  2   ketoconazole (NIZORAL) 2 % cream Apply 1 Application topically 2 (two) times daily as needed.     loratadine (CLARITIN) 10 MG tablet Take 10 mg by mouth daily.     lovastatin  (MEVACOR ) 20 MG tablet Take 1 tablet (20 mg total) by mouth at bedtime. For High Cholesterol 1 tablet  0   Magnesium  Citrate 200 MG TABS Take 1 tablet by mouth daily at 12 noon.     methocarbamol  (ROBAXIN -750) 750 MG tablet Take 1 tablet (750 mg total) by mouth 3 (three) times daily as needed for muscle spasms. 30 tablet 2   Misc Natural Products (COMPLETE PROSTATE HEALTH) TABS Take 1 tablet by mouth in the morning and at bedtime. Vitamin d, Calcium , phosphorus, zinc, selenium, copper, manganeses, chromium, molybdate, beta-sitosterol, silica, boron     Multiple Vitamin (MULTIVITAMIN WITH MINERALS) TABS tablet Take 1 tablet by mouth daily.     pantoprazole  (PROTONIX ) 40 MG tablet Take 40 mg by mouth 2 (two) times daily.     pregabalin  (LYRICA ) 225 MG capsule Take 225 mg by mouth 2 (two) times daily.     propranolol  (INDERAL ) 80 MG tablet Take 80 mg by mouth 2 (two) times daily.     traZODone  (DESYREL ) 100 MG tablet Take 2 tablets (200 mg total) by mouth at bedtime. For sleep 60 tablet 0   amLODipine  (NORVASC ) 5 MG tablet Take 1 tablet by mouth daily. (Patient not taking: Reported on 02/16/2024)     doxycycline  (VIBRA -TABS) 100 MG tablet Take 1 tablet (100 mg total) by mouth 2 (two) times daily. (Patient not taking: Reported on 02/16/2024) 28 tablet 0   ondansetron  (ZOFRAN ) 4 MG tablet Take 1 tablet (4 mg total) by mouth every 8 (eight) hours as needed for nausea or vomiting. (Patient not taking: Reported on 02/16/2024) 40 tablet 0   POTASSIUM BICARBONATE PO Take 500 mg by mouth daily at 12 noon. (Patient not taking: Reported on 02/16/2024)     senna (SENOKOT) 8.6 MG TABS tablet Take 2 tablets by mouth daily. (Patient not taking: Reported on 02/16/2024)     No current facility-administered medications for this visit.   See above-Eliquis  is not a current prescription   ___________________________________________________________________ Objective   Exam:  BP (!) 166/100 (BP Location: Left Arm, Patient Position: Sitting, Cuff Size: Normal)   Pulse 79   Ht 6' (1.829 m)  Wt 200 lb (90.7 kg)   BMI 27.12  kg/m  Wt Readings from Last 3 Encounters:  02/16/24 200 lb (90.7 kg)  10/19/23 200 lb (90.7 kg)  10/08/23 200 lb 3.2 oz (90.8 kg)   Wife in attendance for entire visit General: Well-appearing Eyes: sclera anicteric, no redness ENT: oral mucosa moist without lesions, no cervical or supraclavicular lymphadenopathy CV: Regular without appreciable murmur, no JVD, + bilateral lower leg edema and venous stasis changes Resp: clear to auscultation bilaterally, no wheezing, normal RR and effort noted GI: soft, no tenderness, with active bowel sounds. No guarding or palpable organomegaly noted. Skin; warm and dry, no rash or jaundice noted.  Bilateral lower leg venous stasis changes Neuro: awake, alert and oriented x 3. Normal gross motor function and fluent speech   Labs:  February 2025 [facility] labs accompanying the referral:  Iron 54, ferritin 54, sat 37%, hematocrit 37, creatinine 1.18  Radiologic Studies:  none   Encounter Diagnosis  Name Primary?   Iron deficiency anemia due to chronic blood loss Yes    Assessment and Plan Assessment & Plan Iron deficiency anemia evaluation for possible gastrointestinal blood loss.  Patient does not report overt GI bleeding, unknown if he has had FOB testing at the [facility].  If so, not indicated in their notes. Iron deficiency anemia with potential gastrointestinal blood loss. Hematologist suspects blood loss and referred for gastrointestinal evaluation.  Anemia also sounds multifactorial since he is on B12 supplementation as well.  Raises question of pernicious anemia. - Schedule upper endoscopy and colonoscopy to evaluate for gastrointestinal bleeding. - Ensure bowel preparation prior to colonoscopy. - Discussed risks of endoscopic procedures, including bleeding, perforation, and anesthesia-related risks.  The benefits and risks of the planned procedure(s) were described in detail with the patient or (when appropriate) their health care  proxy.  Risks were outlined as including, but not limited to, bleeding, infection, perforation, adverse medication reaction leading to cardiac or pulmonary decompensation, pancreatitis (if ERCP).  The limitation of incomplete mucosal visualization was also discussed.  No guarantees or warranties were given.  Patient at increased risk for cardiopulmonary complications of procedure due to medical comorbidities.    Thank you for the courtesy of this consult.  Please call me with any questions or concerns.  Victory LITTIE Brand III  CC: Referring provider noted above

## 2024-02-17 ENCOUNTER — Ambulatory Visit: Payer: Self-pay | Admitting: Gastroenterology

## 2024-02-24 ENCOUNTER — Other Ambulatory Visit: Payer: Self-pay | Admitting: Physician Assistant

## 2024-02-24 MED ORDER — DOCUSATE SODIUM 100 MG PO CAPS
100.0000 mg | ORAL_CAPSULE | Freq: Every day | ORAL | 2 refills | Status: AC | PRN
Start: 2024-02-24 — End: 2025-02-23

## 2024-02-24 MED ORDER — OXYCODONE-ACETAMINOPHEN 5-325 MG PO TABS: 1.0000 | ORAL_TABLET | Freq: Four times a day (QID) | ORAL | 0 refills | Status: DC | PRN

## 2024-02-24 MED ORDER — ONDANSETRON HCL 4 MG PO TABS: 4.0000 mg | ORAL_TABLET | Freq: Three times a day (TID) | ORAL | 0 refills | Status: AC | PRN

## 2024-02-24 MED ORDER — METHOCARBAMOL 750 MG PO TABS: 750.0000 mg | ORAL_TABLET | Freq: Three times a day (TID) | ORAL | 2 refills | Status: AC | PRN

## 2024-03-01 NOTE — Patient Instructions (Signed)
 SURGICAL WAITING ROOM VISITATION  Patients having surgery or a procedure may have no more than 2 support people in the waiting area - these visitors may rotate.    Children under the age of 22 must have an adult with them who is not the patient.  Visitors with respiratory illnesses are discouraged from visiting and should remain at home.  If the patient needs to stay at the hospital during part of their recovery, the visitor guidelines for inpatient rooms apply. Pre-op nurse will coordinate an appropriate time for 1 support person to accompany patient in pre-op.  This support person may not rotate.    Please refer to the [facility] website for the visitor guidelines for Inpatients (after your surgery is over and you are in a regular room).    Your procedure is scheduled on: 03/04/24   Report to [facility] Main Entrance    Report to admitting at 7:20 AM   Call this number if you have problems the morning of surgery [phone]   Do not eat food :After Midnight.   After Midnight you may have the following liquids until 6:50 AM DAY OF SURGERY  Water  Non-Citrus Juices (without pulp, NO RED-Apple, White grape, White cranberry) Black Coffee (NO MILK/CREAM OR CREAMERS, sugar ok)  Clear Tea (NO MILK/CREAM OR CREAMERS, sugar ok) regular and decaf                             Plain Jell-O (NO RED)                                           Fruit ices (not with fruit pulp, NO RED)                                     Popsicles (NO RED)                                                               Sports drinks like Gatorade (NO RED)                The day of surgery:  Drink ONE (1) Pre-Surgery Clear Ensure at 6:50 AM the morning of surgery. Drink in one sitting. Do not sip.  This drink was given to you during your hospital  pre-op appointment visit. Nothing else to drink after completing the  Pre-Surgery Clear Ensure.          If you have questions, please contact your surgeon's  office.   FOLLOW BOWEL PREP AND ANY ADDITIONAL PRE OP INSTRUCTIONS YOU RECEIVED FROM YOUR SURGEON'S OFFICE!!!     Oral Hygiene is also important to reduce your risk of infection.                                    Remember - BRUSH YOUR TEETH THE MORNING OF SURGERY WITH YOUR REGULAR TOOTHPASTE  DENTURES WILL BE REMOVED PRIOR TO SURGERY PLEASE DO NOT APPLY Poly grip OR ADHESIVES!!!  Do NOT smoke after Midnight   Stop all vitamins and herbal supplements 7 days before surgery.   Take these medicines the morning of surgery with A SIP OF WATER : Duloxetine , Pantoprazole , Lyrica , Propranolol    DO NOT TAKE ANY ORAL DIABETIC MEDICATIONS DAY OF YOUR SURGERY  Bring CPAP mask and tubing day of surgery.                              You may not have any metal on your body including jewelry, and body piercing             Do not wear lotions, powders, cologne, or deodorant              Men may shave face and neck.   Do not bring valuables to the hospital. [place] IS NOT             RESPONSIBLE   FOR VALUABLES.   Contacts, glasses, dentures or bridgework may not be worn into surgery.   Bring small overnight bag day of surgery.   DO NOT BRING YOUR HOME MEDICATIONS TO THE HOSPITAL. PHARMACY WILL DISPENSE MEDICATIONS LISTED ON YOUR MEDICATION LIST TO YOU DURING YOUR ADMISSION IN THE HOSPITAL!    Special Instructions: Bring a copy of your healthcare power of attorney and living will documents the day of surgery if you haven't scanned them before.              Please read over the following fact sheets you were given: IF YOU HAVE QUESTIONS ABOUT YOUR PRE-OP INSTRUCTIONS PLEASE CALL [phone].   If you received a COVID test during your pre-op visit  it is requested that you wear a mask when out in public, stay away from anyone that may not be feeling well and notify your surgeon if you develop symptoms. If you test positive for Covid or have been in contact with anyone that has  tested positive in the last 10 days please notify you surgeon.      Pre-operative 5 CHG Bath Instructions   You can play a key role in reducing the risk of infection after surgery. Your skin needs to be as free of germs as possible. You can reduce the number of germs on your skin by washing with CHG (chlorhexidine  gluconate) soap before surgery. CHG is an antiseptic soap that kills germs and continues to kill germs even after washing.   DO NOT use if you have an allergy to chlorhexidine /CHG or antibacterial soaps. If your skin becomes reddened or irritated, stop using the CHG and notify one of our RNs at [phone].   Please shower with the CHG soap starting 4 days before surgery using the following schedule:     Please keep in mind the following:  DO NOT shave, including legs and underarms, starting the day of your first shower.   You may shave your face at any point before/day of surgery.  Place clean sheets on your bed the day you start using CHG soap. Use a clean washcloth (not used since being washed) for each shower. DO NOT sleep with pets once you start using the CHG.   CHG Shower Instructions:  If you choose to wash your hair and private area, wash first with your normal shampoo/soap.  After you use shampoo/soap, rinse your hair and body thoroughly to remove shampoo/soap residue.  Turn the water  OFF and apply about 3 tablespoons (45 ml) of  CHG soap to a CLEAN washcloth.  Apply CHG soap ONLY FROM YOUR NECK DOWN TO YOUR TOES (washing for 3-5 minutes)  DO NOT use CHG soap on face, private areas, open wounds, or sores.  Pay special attention to the area where your surgery is being performed.  If you are having back surgery, having someone wash your back for you may be helpful. Wait 2 minutes after CHG soap is applied, then you may rinse off the CHG soap.  Pat dry with a clean towel  Put on clean clothes/pajamas   If you choose to wear lotion, please use ONLY the CHG-compatible  lotions on the back of this paper.     Additional instructions for the day of surgery: DO NOT APPLY any lotions, deodorants, cologne, or perfumes.   Put on clean/comfortable clothes.  Brush your teeth.  Ask your nurse before applying any prescription medications to the skin.      CHG Compatible Lotions   Aveeno Moisturizing lotion  Cetaphil Moisturizing Cream  Cetaphil Moisturizing Lotion  Clairol Herbal Essence Moisturizing Lotion, Dry Skin  Clairol Herbal Essence Moisturizing Lotion, Extra Dry Skin  Clairol Herbal Essence Moisturizing Lotion, Normal Skin  Curel Age Defying Therapeutic Moisturizing Lotion with Alpha Hydroxy  Curel Extreme Care Body Lotion  Curel Soothing Hands Moisturizing Hand Lotion  Curel Therapeutic Moisturizing Cream, Fragrance-Free  Curel Therapeutic Moisturizing Lotion, Fragrance-Free  Curel Therapeutic Moisturizing Lotion, Original Formula  Eucerin Daily Replenishing Lotion  Eucerin Dry Skin Therapy Plus Alpha Hydroxy Crme  Eucerin Dry Skin Therapy Plus Alpha Hydroxy Lotion  Eucerin Original Crme  Eucerin Original Lotion  Eucerin Plus Crme Eucerin Plus Lotion  Eucerin TriLipid Replenishing Lotion  Keri Anti-Bacterial Hand Lotion  Keri Deep Conditioning Original Lotion Dry Skin Formula Softly Scented  Keri Deep Conditioning Original Lotion, Fragrance Free Sensitive Skin Formula  Keri Lotion Fast Absorbing Fragrance Free Sensitive Skin Formula  Keri Lotion Fast Absorbing Softly Scented Dry Skin Formula  Keri Original Lotion  Keri Skin Renewal Lotion Keri Silky Smooth Lotion  Keri Silky Smooth Sensitive Skin Lotion  Nivea Body Creamy Conditioning Oil  Nivea Body Extra Enriched Lotion  Nivea Body Original Lotion  Nivea Body Sheer Moisturizing Lotion Nivea Crme  Nivea Skin Firming Lotion  NutraDerm 30 Skin Lotion  NutraDerm Skin Lotion  NutraDerm Therapeutic Skin Cream  NutraDerm Therapeutic Skin Lotion  ProShield Protective Hand Cream   Provon moisturizing lotion   View Pre-Surgery Education Videos:  [email or web address]     Incentive Spirometer  An incentive spirometer is a tool that can help keep your lungs clear and active. This tool measures how well you are filling your lungs with each breath. Taking long deep breaths may help reverse or decrease the chance of developing breathing (pulmonary) problems (especially infection) following: A long period of time when you are unable to move or be active. BEFORE THE PROCEDURE  If the spirometer includes an indicator to show your best effort, your nurse or respiratory therapist will set it to a desired goal. If possible, sit up straight or lean slightly forward. Try not to slouch. Hold the incentive spirometer in an upright position. INSTRUCTIONS FOR USE  Sit on the edge of your bed if possible, or sit up as far as you can in bed or on a chair. Hold the incentive spirometer in an upright position. Breathe out normally. Place the mouthpiece in your mouth and seal your lips tightly around it. Breathe in slowly and as deeply as possible, raising the piston  or the ball toward the top of the column. Hold your breath for 3-5 seconds or for as long as possible. Allow the piston or ball to fall to the bottom of the column. Remove the mouthpiece from your mouth and breathe out normally. Rest for a few seconds and repeat Steps 1 through 7 at least 10 times every 1-2 hours when you are awake. Take your time and take a few normal breaths between deep breaths. The spirometer may include an indicator to show your best effort. Use the indicator as a goal to work toward during each repetition. After each set of 10 deep breaths, practice coughing to be sure your lungs are clear. If you have an incision (the cut made at the time of surgery), support your incision when coughing by placing a pillow or rolled up towels firmly against  it. Once you are able to get out of bed, walk around indoors and cough well. You may stop using the incentive spirometer when instructed by your caregiver.  RISKS AND COMPLICATIONS Take your time so you do not get dizzy or light-headed. If you are in pain, you may need to take or ask for pain medication before doing incentive spirometry. It is harder to take a deep breath if you are having pain. AFTER USE Rest and breathe slowly and easily. It can be helpful to keep track of a log of your progress. Your caregiver can provide you with a simple table to help with this. If you are using the spirometer at home, follow these instructions: SEEK MEDICAL CARE IF:  You are having difficultly using the spirometer. You have trouble using the spirometer as often as instructed. Your pain medication is not giving enough relief while using the spirometer. You develop fever of 100.5 F (38.1 C) or higher. SEEK IMMEDIATE MEDICAL CARE IF:  You cough up bloody sputum that had not been present before. You develop fever of 102 F (38.9 C) or greater. You develop worsening pain at or near the incision site. MAKE SURE YOU:  Understand these instructions. Will watch your condition. Will get help right away if you are not doing well or get worse. Document Released: 10/06/2006 Document Revised: 08/18/2011 Document Reviewed: 12/07/2006 [facility] Patient Information 2014 [place], [place].   ________________________________________________________________________

## 2024-03-01 NOTE — Progress Notes (Signed)
 COVID Vaccine Completed: yes  Date of COVID positive in last 90 days:  PCP - Sula Sigrid Kays, MD Cardiologist - n/a  Chest x-ray - N/A EKG - 10/08/23 Epic Stress Test - N/A ECHO - n/a Cardiac Cath - n/a Pacemaker/ICD device last checked:N/A Spinal Cord Stimulator:N/A  Bowel Prep - N/A  Sleep Study - N/A CPAP -   Fasting Blood Sugar - N/A Checks Blood Sugar _____ times a day  Last dose of GLP1 agonist-  N/A GLP1 instructions:  Do not take after     Last dose of SGLT-2 inhibitors-  N/A SGLT-2 instructions:  Do not take after     Blood Thinner Instructions: N/A Last dose:   Time: Aspirin Instructions:N/A Last Dose:  Activity level: Can go up a flight of stairs and perform activities of daily living without stopping and without symptoms of chest pain or shortness of breath. Patient avoids stairs but can do them if neccessary. Ambulates with Rolator.    Anesthesia review: HTN, COPD, hepatitis, DVT  Patient denies shortness of breath, fever, cough and chest pain at PAT appointment  Patient verbalized understanding of instructions that were given to them at the PAT appointment. Patient was also instructed that they will need to review over the PAT instructions again at home before surgery.

## 2024-03-02 ENCOUNTER — Encounter (HOSPITAL_COMMUNITY): Payer: Self-pay

## 2024-03-02 ENCOUNTER — Other Ambulatory Visit: Payer: Self-pay

## 2024-03-02 ENCOUNTER — Encounter (HOSPITAL_COMMUNITY)
Admission: RE | Admit: 2024-03-02 | Discharge: 2024-03-02 | Disposition: A | Discharge status: Home / Self Care | Source: Ambulatory Visit | Attending: Orthopaedic Surgery | Admitting: Orthopaedic Surgery

## 2024-03-02 VITALS — BP 131/88 | HR 86 | Temp 98.4°F | Resp 16 | Ht 72.0 in | Wt 200.0 lb

## 2024-03-02 DIAGNOSIS — F1021 Alcohol dependence, in remission: Secondary | ICD-10-CM | POA: Insufficient documentation

## 2024-03-02 DIAGNOSIS — G250 Essential tremor: Secondary | ICD-10-CM | POA: Diagnosis not present

## 2024-03-02 DIAGNOSIS — E7800 Pure hypercholesterolemia, unspecified: Secondary | ICD-10-CM | POA: Insufficient documentation

## 2024-03-02 DIAGNOSIS — R490 Dysphonia: Secondary | ICD-10-CM | POA: Insufficient documentation

## 2024-03-02 DIAGNOSIS — D472 Monoclonal gammopathy: Secondary | ICD-10-CM | POA: Diagnosis not present

## 2024-03-02 DIAGNOSIS — G629 Polyneuropathy, unspecified: Secondary | ICD-10-CM | POA: Insufficient documentation

## 2024-03-02 DIAGNOSIS — Z96641 Presence of right artificial hip joint: Secondary | ICD-10-CM | POA: Insufficient documentation

## 2024-03-02 DIAGNOSIS — Z8619 Personal history of other infectious and parasitic diseases: Secondary | ICD-10-CM | POA: Diagnosis not present

## 2024-03-02 DIAGNOSIS — J449 Chronic obstructive pulmonary disease, unspecified: Secondary | ICD-10-CM | POA: Insufficient documentation

## 2024-03-02 DIAGNOSIS — F1020 Alcohol dependence, uncomplicated: Secondary | ICD-10-CM

## 2024-03-02 DIAGNOSIS — Z86718 Personal history of other venous thrombosis and embolism: Secondary | ICD-10-CM | POA: Insufficient documentation

## 2024-03-02 DIAGNOSIS — I10 Essential (primary) hypertension: Secondary | ICD-10-CM | POA: Diagnosis not present

## 2024-03-02 DIAGNOSIS — M1712 Unilateral primary osteoarthritis, left knee: Secondary | ICD-10-CM | POA: Insufficient documentation

## 2024-03-02 DIAGNOSIS — Z01812 Encounter for preprocedural laboratory examination: Secondary | ICD-10-CM | POA: Diagnosis present

## 2024-03-02 DIAGNOSIS — Z720 Tobacco use: Secondary | ICD-10-CM | POA: Insufficient documentation

## 2024-03-02 HISTORY — DX: Anemia, unspecified: D64.9

## 2024-03-02 LAB — COMPREHENSIVE METABOLIC PANEL WITH GFR
ALT: 6 U/L (ref 0–44)
AST: 17 U/L (ref 15–41)
Albumin: 4 g/dL (ref 3.5–5.0)
Alkaline Phosphatase: 99 U/L (ref 38–126)
Anion gap: 10 (ref 5–15)
BUN: 11 mg/dL (ref 8–23)
CO2: 26 mmol/L (ref 22–32)
Calcium: 9.4 mg/dL (ref 8.9–10.3)
Chloride: 108 mmol/L (ref 98–111)
Creatinine, Ser: 1.17 mg/dL (ref 0.61–1.24)
GFR, Estimated: >60 mL/min (ref >60)
Glucose, Bld: 113 mg/dL — ABNORMAL HIGH (ref 70–99)
Potassium: 4.2 mmol/L (ref 3.5–5.1)
Sodium: 144 mmol/L (ref 135–145)
Total Bilirubin: 0.7 mg/dL (ref 0.0–1.2)
Total Protein: 7 g/dL (ref 6.5–8.1)

## 2024-03-02 LAB — CBC
HCT: 39.8 % (ref 39.0–52.0)
Hemoglobin: 13 g/dL (ref 13.0–17.0)
MCH: 29.3 pg (ref 26.0–34.0)
MCHC: 32.7 g/dL (ref 30.0–36.0)
MCV: 89.6 fL (ref 80.0–100.0)
Platelets: 223 K/uL (ref 150–400)
RBC: 4.44 MIL/uL (ref 4.22–5.81)
RDW: 14.4 % (ref 11.5–15.5)
WBC: 4.9 K/uL (ref 4.0–10.5)
nRBC: 0 % (ref 0.0–0.2)

## 2024-03-02 NOTE — Progress Notes (Signed)
 Case: [id number] Date/Time: 03/04/24 0936   Procedure: ARTHROPLASTY, KNEE, TOTAL (Left: Knee)   Anesthesia type: Spinal   Diagnosis: Primary osteoarthritis of left knee [M17.12]   Pre-op diagnosis: osteoarthritis of left knee   Location: [facility] ROOM 08 / [facility]   Surgeons: Jerri Kay HERO, MD       DISCUSSION: Patient is a 73 year old male with PMH of current smoking, COPD, HTN, hypercholesterolemia, Hepatitis C (reported s/p treatment), MGUS, polyneuropathy (likely due to alcohol  use 01/01/23), essential tremor, DVT (diagnosed ~ 10/2017 at [facility] and started on warfarin but referred to ED 11/15/17 due to concern for ineffective treatment, US  showed BLE age indeterminate DVT), osteoarthritis, 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 Reported an episode of prolonged emergence. 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    Patient underwent R THA on 10/19/23 under spinal anesthesia. No complications noted. Now scheduled for L TKA under spinal  Seen by PCP on 01/28/24. BP stable. COPD stable without recent exacerbation. All other issues stable. Cleared as moderate risk for R THA in May (scanned in media on 10/21/23).    VS: BP 131/88   Pulse 86   Temp 36.9 C (Oral)   Resp 16   Ht 6' (1.829 m)   Wt 90.7 kg   SpO2 97%   BMI 27.12 kg/m   PROVIDERS: Sigrid Deidra Fox, MD   LABS: Labs reviewed: Acceptable for surgery. (all labs ordered are listed, but only abnormal results are displayed)  Labs Reviewed  COMPREHENSIVE METABOLIC PANEL WITH GFR - Abnormal; Notable for the following components:      Result Value   Glucose, Bld 113 (*)    All other components within normal limits  CBC     IMAGES:   EKG 10/08/23:  NSR rate 82  CV:  Past Medical History:  Diagnosis Date   Anemia    Arthritis    Complication of anesthesia    slow to wake up after last surgery   COPD (chronic obstructive pulmonary disease)  (HCC)    Depression    DVT (deep venous thrombosis) (HCC)    GERD (gastroesophageal reflux disease)    Hepatitis    hep c   High blood pressure    High cholesterol    MGUS (monoclonal gammopathy of unknown significance)    Neuropathy    Peripheral neuropathy    feet and hands    Past Surgical History:  Procedure Laterality Date   DIRECT LARYNGOSCOPY  12/26/2011   Procedure: DIRECT LARYNGOSCOPY;  Surgeon: Merilee Kraft, MD;  Location: [facility] OR;  Service: ENT;  Laterality: N/A;  Directy Laryngoscopy with biopsy [id number]   DIRECT LARYNGOSCOPY N/A 10/21/2012   Procedure: DIRECT LARYNGOSCOPY;  Surgeon: Merilee Kraft, MD;  Location: [facility] OR;  Service: ENT;  Laterality: N/A;   ESOPHAGOGASTRODUODENOSCOPY N/A 10/25/2012   Procedure: ESOPHAGOGASTRODUODENOSCOPY (EGD);  Surgeon: Renaye Sous, MD;  Location: [facility] ENDOSCOPY;  Service: Endoscopy;  Laterality: N/A;   TONSILLECTOMY     TOTAL HIP ARTHROPLASTY Right 10/19/2023   Procedure: ARTHROPLASTY, HIP, TOTAL, ANTERIOR APPROACH;  Surgeon: Jerri Kay HERO, MD;  Location: [facility] OR;  Service: Orthopedics;  Laterality: Right;    MEDICATIONS:  amLODipine  (NORVASC ) 5 MG tablet   Ascorbic Acid  (VITAMIN C ) 1000 MG tablet   cyanocobalamin (VITAMIN B12) 500 MCG tablet   docusate sodium  (COLACE) 100 MG capsule   doxycycline  (VIBRA -TABS) 100 MG tablet   DULoxetine  (CYMBALTA ) 60 MG capsule  ferrous sulfate  325 (65 FE) MG EC tablet   fluticasone  (FLONASE ) 50 MCG/ACT nasal spray   ketoconazole (NIZORAL) 2 % cream   lovastatin  (MEVACOR ) 20 MG tablet   Magnesium  Citrate 200 MG TABS   methocarbamol  (ROBAXIN -750) 750 MG tablet   Misc Natural Products (COMPLETE PROSTATE HEALTH) TABS   Multiple Vitamin (MULTIVITAMIN WITH MINERALS) TABS tablet   ondansetron  (ZOFRAN ) 4 MG tablet   oxyCODONE -acetaminophen  (PERCOCET) 5-325 MG tablet   pantoprazole  (PROTONIX ) 40 MG tablet   pregabalin  (LYRICA ) 225 MG capsule   propranolol  (INDERAL ) 80 MG tablet   traZODone  (DESYREL ) 100 MG  tablet   No current facility-administered medications for this encounter.   Burnard CHRISTELLA Odis DEVONNA [facility] Surgical Short Stay/Anesthesiology [facility] Phone [phone] 03/02/2024 2:35 PM

## 2024-03-02 NOTE — Anesthesia Preprocedure Evaluation (Signed)
 Anesthesia Evaluation  Patient identified by MRN, date of birth, ID band Patient awake    Reviewed: Allergy & Precautions, NPO status , Patient's Chart, lab work & pertinent test results  Airway Mallampati: II  TM Distance: >3 FB Neck ROM: Full    Dental no notable dental hx.    Pulmonary COPD, Current Smoker and Patient abstained from smoking.   Pulmonary exam normal        Cardiovascular hypertension, + DVT   Rhythm:Regular Rate:Normal     Neuro/Psych    Depression    negative neurological ROS     GI/Hepatic ,GERD  Medicated,,(+) Hepatitis -, C  Endo/Other  negative endocrine ROS    Renal/GU negative Renal ROS  negative genitourinary   Musculoskeletal  (+) Arthritis , Osteoarthritis,    Abdominal Normal abdominal exam  (+)   Peds  Hematology  (+) Blood dyscrasia, anemia Lab Results      Component                Value               Date                      WBC                      4.9                 03/02/2024                HGB                      13.0                03/02/2024                HCT                      39.8                03/02/2024                MCV                      89.6                03/02/2024                PLT                      223                 03/02/2024              Anesthesia Other Findings   Reproductive/Obstetrics                              Anesthesia Physical Anesthesia Plan  ASA: 3  Anesthesia Plan: MAC, Spinal and Regional   Post-op Pain Management: Regional block*   Induction: Intravenous  PONV Risk Score and Plan: Ondansetron , Dexamethasone , Propofol  infusion and Treatment may vary due to age or medical condition  Airway Management Planned: Simple Face Mask and Nasal Cannula  Additional Equipment: None  Intra-op Plan:   Post-operative Plan:   Informed Consent: I have reviewed the patients History and Physical, chart, labs  and  discussed the procedure including the risks, benefits and alternatives for the proposed anesthesia with the patient or authorized representative who has indicated his/her understanding and acceptance.     Dental advisory given  Plan Discussed with: CRNA  Anesthesia Plan Comments: (See PAT note from 9/24 )         Anesthesia Quick Evaluation

## 2024-03-03 MED ORDER — TRANEXAMIC ACID 1000 MG/10ML IV SOLN
2000.0000 mg | INTRAVENOUS | Status: DC
  Filled 2024-03-03: qty 20

## 2024-03-04 ENCOUNTER — Other Ambulatory Visit: Payer: Self-pay | Admitting: Physician Assistant

## 2024-03-04 ENCOUNTER — Ambulatory Visit (HOSPITAL_COMMUNITY): Payer: Self-pay | Admitting: Certified Registered"

## 2024-03-04 ENCOUNTER — Other Ambulatory Visit (HOSPITAL_COMMUNITY): Payer: Self-pay

## 2024-03-04 ENCOUNTER — Other Ambulatory Visit: Payer: Self-pay

## 2024-03-04 ENCOUNTER — Encounter (HOSPITAL_COMMUNITY): Payer: Self-pay | Admitting: Orthopaedic Surgery

## 2024-03-04 ENCOUNTER — Observation Stay (HOSPITAL_COMMUNITY)

## 2024-03-04 ENCOUNTER — Encounter (HOSPITAL_COMMUNITY)
Admission: RE | Disposition: A | Discharge status: Home / Self Care | Payer: Self-pay | Source: Home / Self Care | Attending: Orthopaedic Surgery

## 2024-03-04 ENCOUNTER — Ambulatory Visit (HOSPITAL_COMMUNITY): Payer: Self-pay | Admitting: Physician Assistant

## 2024-03-04 ENCOUNTER — Observation Stay (HOSPITAL_COMMUNITY)
Admission: RE | Admit: 2024-03-04 | Discharge: 2024-03-09 | Disposition: A | Discharge status: Home / Self Care | Attending: Orthopaedic Surgery | Admitting: Orthopaedic Surgery

## 2024-03-04 DIAGNOSIS — M24562 Contracture, left knee: Secondary | ICD-10-CM | POA: Diagnosis not present

## 2024-03-04 DIAGNOSIS — Z79899 Other long term (current) drug therapy: Secondary | ICD-10-CM | POA: Insufficient documentation

## 2024-03-04 DIAGNOSIS — M1712 Unilateral primary osteoarthritis, left knee: Secondary | ICD-10-CM

## 2024-03-04 DIAGNOSIS — Z96652 Presence of left artificial knee joint: Secondary | ICD-10-CM

## 2024-03-04 DIAGNOSIS — Z96641 Presence of right artificial hip joint: Secondary | ICD-10-CM | POA: Insufficient documentation

## 2024-03-04 DIAGNOSIS — F17200 Nicotine dependence, unspecified, uncomplicated: Secondary | ICD-10-CM | POA: Diagnosis not present

## 2024-03-04 DIAGNOSIS — I10 Essential (primary) hypertension: Secondary | ICD-10-CM

## 2024-03-04 DIAGNOSIS — Z7901 Long term (current) use of anticoagulants: Secondary | ICD-10-CM | POA: Insufficient documentation

## 2024-03-04 DIAGNOSIS — M21062 Valgus deformity, not elsewhere classified, left knee: Secondary | ICD-10-CM | POA: Insufficient documentation

## 2024-03-04 DIAGNOSIS — F17210 Nicotine dependence, cigarettes, uncomplicated: Secondary | ICD-10-CM | POA: Insufficient documentation

## 2024-03-04 DIAGNOSIS — J449 Chronic obstructive pulmonary disease, unspecified: Secondary | ICD-10-CM | POA: Diagnosis not present

## 2024-03-04 DIAGNOSIS — R252 Cramp and spasm: Secondary | ICD-10-CM

## 2024-03-04 HISTORY — PX: TOTAL KNEE ARTHROPLASTY: SHX125

## 2024-03-04 LAB — SURGICAL PCR SCREEN
MRSA, PCR: NEGATIVE
Staphylococcus aureus: NEGATIVE

## 2024-03-04 LAB — BASIC METABOLIC PANEL WITH GFR
Anion gap: 9 (ref 5–15)
BUN: 9 mg/dL (ref 8–23)
CO2: 26 mmol/L (ref 22–32)
Calcium: 9.2 mg/dL (ref 8.9–10.3)
Chloride: 109 mmol/L (ref 98–111)
Creatinine, Ser: 1.18 mg/dL (ref 0.61–1.24)
GFR, Estimated: >60 mL/min (ref >60)
Glucose, Bld: 102 mg/dL — ABNORMAL HIGH (ref 70–99)
Potassium: 5.1 mmol/L (ref 3.5–5.1)
Sodium: 144 mmol/L (ref 135–145)

## 2024-03-04 SURGERY — ARTHROPLASTY, KNEE, TOTAL
Anesthesia: Monitor Anesthesia Care | Site: Knee | Laterality: Left

## 2024-03-04 MED ORDER — FENTANYL CITRATE PF 50 MCG/ML IJ SOSY
50.0000 ug | PREFILLED_SYRINGE | INTRAMUSCULAR | Status: AC
  Administered 2024-03-04: 100 ug via INTRAVENOUS
  Filled 2024-03-04: qty 2

## 2024-03-04 MED ORDER — MIDAZOLAM HCL 2 MG/2ML IJ SOLN: 1.0000 mg | INTRAMUSCULAR | Status: DC

## 2024-03-04 MED ORDER — METOCLOPRAMIDE HCL 5 MG/ML IJ SOLN: 5.0000 mg | Freq: Three times a day (TID) | INTRAMUSCULAR | Status: DC | PRN

## 2024-03-04 MED ORDER — ORAL CARE MOUTH RINSE: 15.0000 mL | Freq: Once | OROMUCOSAL | Status: AC

## 2024-03-04 MED ORDER — ONDANSETRON HCL 4 MG PO TABS: 4.0000 mg | ORAL_TABLET | Freq: Four times a day (QID) | ORAL | Status: DC | PRN

## 2024-03-04 MED ORDER — SODIUM CHLORIDE 0.9 % IR SOLN
Status: DC | PRN
  Administered 2024-03-04: 1000 mL

## 2024-03-04 MED ORDER — ROPIVACAINE HCL 7.5 MG/ML IJ SOLN
INTRAMUSCULAR | Status: DC | PRN
  Administered 2024-03-04: 20 mL via PERINEURAL

## 2024-03-04 MED ORDER — ONDANSETRON HCL 4 MG/2ML IJ SOLN
INTRAMUSCULAR | Status: AC
  Filled 2024-03-04: qty 2

## 2024-03-04 MED ORDER — ACETAMINOPHEN 500 MG PO TABS
1000.0000 mg | ORAL_TABLET | Freq: Four times a day (QID) | ORAL | Status: AC
  Administered 2024-03-04 – 2024-03-05 (×3): 1000 mg via ORAL
  Filled 2024-03-04 (×4): qty 2

## 2024-03-04 MED ORDER — METOCLOPRAMIDE HCL 5 MG PO TABS: 5.0000 mg | ORAL_TABLET | Freq: Three times a day (TID) | ORAL | Status: DC | PRN

## 2024-03-04 MED ORDER — PROPOFOL 500 MG/50ML IV EMUL
INTRAVENOUS | Status: DC | PRN
  Administered 2024-03-04: 40 ug/kg/min via INTRAVENOUS

## 2024-03-04 MED ORDER — METHOCARBAMOL 1000 MG/10ML IJ SOLN: 500.0000 mg | Freq: Four times a day (QID) | INTRAMUSCULAR | Status: DC | PRN

## 2024-03-04 MED ORDER — PHENYLEPHRINE 80 MCG/ML (10ML) SYRINGE FOR IV PUSH (FOR BLOOD PRESSURE SUPPORT)
PREFILLED_SYRINGE | INTRAVENOUS | Status: DC | PRN
  Administered 2024-03-04 (×2): 80 ug via INTRAVENOUS

## 2024-03-04 MED ORDER — ONDANSETRON HCL 4 MG/2ML IJ SOLN: 4.0000 mg | Freq: Four times a day (QID) | INTRAMUSCULAR | Status: DC | PRN

## 2024-03-04 MED ORDER — FENTANYL CITRATE (PF) 100 MCG/2ML IJ SOLN
INTRAMUSCULAR | Status: AC
  Filled 2024-03-04: qty 2

## 2024-03-04 MED ORDER — DEXAMETHASONE SODIUM PHOSPHATE 10 MG/ML IJ SOLN
INTRAMUSCULAR | Status: AC
  Filled 2024-03-04: qty 1

## 2024-03-04 MED ORDER — CHLORHEXIDINE GLUCONATE 0.12 % MT SOLN
15.0000 mL | Freq: Once | OROMUCOSAL | Status: AC
  Administered 2024-03-04: 15 mL via OROMUCOSAL

## 2024-03-04 MED ORDER — DOXYCYCLINE HYCLATE 100 MG PO TABS
100.0000 mg | ORAL_TABLET | Freq: Two times a day (BID) | ORAL | Status: DC
Start: 2024-03-04 — End: 2024-03-09
  Administered 2024-03-04 – 2024-03-09 (×10): 100 mg via ORAL
  Filled 2024-03-04 (×10): qty 1

## 2024-03-04 MED ORDER — BUPIVACAINE IN DEXTROSE 0.75-8.25 % IT SOLN
INTRATHECAL | Status: DC | PRN
Start: 2024-03-04 — End: 2024-03-04
  Administered 2024-03-04: 2 mL via INTRATHECAL

## 2024-03-04 MED ORDER — LIDOCAINE HCL (PF) 2 % IJ SOLN
INTRAMUSCULAR | Status: AC
  Filled 2024-03-04: qty 5

## 2024-03-04 MED ORDER — OXYCODONE HCL 5 MG PO TABS
5.0000 mg | ORAL_TABLET | Freq: Four times a day (QID) | ORAL | Status: DC | PRN
  Administered 2024-03-04: 5 mg via ORAL
  Filled 2024-03-04: qty 1

## 2024-03-04 MED ORDER — PROPOFOL 10 MG/ML IV BOLUS
INTRAVENOUS | Status: DC | PRN
  Administered 2024-03-04: 20 mg via INTRAVENOUS

## 2024-03-04 MED ORDER — POVIDONE-IODINE 10 % EX SWAB
2.0000 | Freq: Once | CUTANEOUS | Status: DC
Start: 2024-03-04 — End: 2024-03-04

## 2024-03-04 MED ORDER — OXYCODONE HCL 5 MG PO TABS
10.0000 mg | ORAL_TABLET | Freq: Four times a day (QID) | ORAL | Status: DC | PRN
  Administered 2024-03-04 – 2024-03-09 (×10): 10 mg via ORAL
  Filled 2024-03-04 (×12): qty 2

## 2024-03-04 MED ORDER — FENTANYL CITRATE PF 50 MCG/ML IJ SOSY: 25.0000 ug | PREFILLED_SYRINGE | INTRAMUSCULAR | Status: DC | PRN

## 2024-03-04 MED ORDER — STERILE WATER FOR IRRIGATION IR SOLN
Status: DC | PRN
  Administered 2024-03-04: 2000 mL

## 2024-03-04 MED ORDER — METHOCARBAMOL 500 MG PO TABS
500.0000 mg | ORAL_TABLET | Freq: Four times a day (QID) | ORAL | Status: DC | PRN
  Administered 2024-03-04 – 2024-03-09 (×9): 500 mg via ORAL
  Filled 2024-03-04 (×9): qty 1

## 2024-03-04 MED ORDER — VANCOMYCIN HCL 1 G IV SOLR
INTRAVENOUS | Status: DC | PRN
  Administered 2024-03-04: 1000 mg via TOPICAL

## 2024-03-04 MED ORDER — TRANEXAMIC ACID-NACL 1000-0.7 MG/100ML-% IV SOLN
1000.0000 mg | Freq: Once | INTRAVENOUS | Status: AC
  Administered 2024-03-04: 1000 mg via INTRAVENOUS
  Filled 2024-03-04: qty 100

## 2024-03-04 MED ORDER — HYDROMORPHONE HCL 1 MG/ML IJ SOLN
1.0000 mg | Freq: Three times a day (TID) | INTRAMUSCULAR | Status: DC | PRN
  Administered 2024-03-04 – 2024-03-07 (×3): 1 mg via INTRAVENOUS
  Filled 2024-03-04 (×3): qty 1

## 2024-03-04 MED ORDER — CEFAZOLIN SODIUM-DEXTROSE 2-4 GM/100ML-% IV SOLN
2.0000 g | INTRAVENOUS | Status: AC
  Administered 2024-03-04: 2 g via INTRAVENOUS
  Filled 2024-03-04: qty 100

## 2024-03-04 MED ORDER — VANCOMYCIN HCL 1000 MG IV SOLR
INTRAVENOUS | Status: AC
  Filled 2024-03-04: qty 20

## 2024-03-04 MED ORDER — ISOPROPYL ALCOHOL 70 % SOLN
Status: DC | PRN
  Administered 2024-03-04: 1 via TOPICAL

## 2024-03-04 MED ORDER — PREGABALIN 75 MG PO CAPS
225.0000 mg | ORAL_CAPSULE | Freq: Two times a day (BID) | ORAL | Status: DC
Start: 2024-03-04 — End: 2024-03-09
  Administered 2024-03-04 – 2024-03-09 (×10): 225 mg via ORAL
  Filled 2024-03-04 (×10): qty 3

## 2024-03-04 MED ORDER — DEXAMETHASONE SODIUM PHOSPHATE 10 MG/ML IJ SOLN
INTRAMUSCULAR | Status: DC | PRN
Start: 2024-03-04 — End: 2024-03-04
  Administered 2024-03-04: 10 mg
  Administered 2024-03-04: 4 mg

## 2024-03-04 MED ORDER — AMLODIPINE BESYLATE 5 MG PO TABS
5.0000 mg | ORAL_TABLET | Freq: Every day | ORAL | Status: DC
  Administered 2024-03-04 – 2024-03-09 (×6): 5 mg via ORAL
  Filled 2024-03-04 (×6): qty 1

## 2024-03-04 MED ORDER — SODIUM CHLORIDE 0.9 % IV SOLN: INTRAVENOUS | Status: DC

## 2024-03-04 MED ORDER — MENTHOL 3 MG MT LOZG: 1.0000 | LOZENGE | OROMUCOSAL | Status: DC | PRN

## 2024-03-04 MED ORDER — LACTATED RINGERS IV SOLN: INTRAVENOUS | Status: DC

## 2024-03-04 MED ORDER — TRANEXAMIC ACID-NACL 1000-0.7 MG/100ML-% IV SOLN
1000.0000 mg | INTRAVENOUS | Status: AC
  Administered 2024-03-04: 1000 mg via INTRAVENOUS
  Filled 2024-03-04: qty 100

## 2024-03-04 MED ORDER — PHENYLEPHRINE 80 MCG/ML (10ML) SYRINGE FOR IV PUSH (FOR BLOOD PRESSURE SUPPORT)
PREFILLED_SYRINGE | INTRAVENOUS | Status: AC
  Filled 2024-03-04: qty 10

## 2024-03-04 MED ORDER — ACETAMINOPHEN 325 MG PO TABS
325.0000 mg | ORAL_TABLET | Freq: Four times a day (QID) | ORAL | Status: DC | PRN
  Administered 2024-03-08 – 2024-03-09 (×2): 650 mg via ORAL
  Filled 2024-03-04 (×2): qty 2

## 2024-03-04 MED ORDER — APIXABAN 2.5 MG PO TABS
2.5000 mg | ORAL_TABLET | Freq: Two times a day (BID) | ORAL | 0 refills | Status: DC
  Filled 2024-03-04: qty 60, 30d supply, fill #0

## 2024-03-04 MED ORDER — PROPOFOL 1000 MG/100ML IV EMUL
INTRAVENOUS | Status: AC
  Filled 2024-03-04: qty 100

## 2024-03-04 MED ORDER — BUPIVACAINE-MELOXICAM ER 200-6 MG/7ML IJ SOLN
INTRAMUSCULAR | Status: DC | PRN
  Administered 2024-03-04: 400 mg

## 2024-03-04 MED ORDER — KETOROLAC TROMETHAMINE 15 MG/ML IJ SOLN
7.5000 mg | Freq: Four times a day (QID) | INTRAMUSCULAR | Status: AC
  Administered 2024-03-04 – 2024-03-05 (×3): 7.5 mg via INTRAVENOUS
  Filled 2024-03-04 (×3): qty 1

## 2024-03-04 MED ORDER — TRANEXAMIC ACID 1000 MG/10ML IV SOLN
INTRAVENOUS | Status: DC | PRN
  Administered 2024-03-04: 2000 mg via TOPICAL

## 2024-03-04 MED ORDER — ACETAMINOPHEN 10 MG/ML IV SOLN: 1000.0000 mg | Freq: Once | INTRAVENOUS | Status: DC | PRN

## 2024-03-04 MED ORDER — PROPOFOL 10 MG/ML IV BOLUS
INTRAVENOUS | Status: AC
  Filled 2024-03-04: qty 20

## 2024-03-04 MED ORDER — DOCUSATE SODIUM 100 MG PO CAPS
100.0000 mg | ORAL_CAPSULE | Freq: Two times a day (BID) | ORAL | Status: DC
  Administered 2024-03-04 – 2024-03-09 (×10): 100 mg via ORAL
  Filled 2024-03-04 (×10): qty 1

## 2024-03-04 MED ORDER — DULOXETINE HCL 60 MG PO CPEP
60.0000 mg | ORAL_CAPSULE | Freq: Two times a day (BID) | ORAL | Status: DC
  Administered 2024-03-04 – 2024-03-09 (×10): 60 mg via ORAL
  Filled 2024-03-04 (×10): qty 1

## 2024-03-04 MED ORDER — LIDOCAINE HCL (CARDIAC) PF 100 MG/5ML IV SOSY
PREFILLED_SYRINGE | INTRAVENOUS | Status: DC | PRN
  Administered 2024-03-04: 40 mg via INTRAVENOUS

## 2024-03-04 MED ORDER — PHENOL 1.4 % MT LIQD: 1.0000 | OROMUCOSAL | Status: DC | PRN

## 2024-03-04 MED ORDER — ONDANSETRON HCL 4 MG/2ML IJ SOLN
INTRAMUSCULAR | Status: DC | PRN
  Administered 2024-03-04: 4 mg via INTRAVENOUS

## 2024-03-04 MED ORDER — 0.9 % SODIUM CHLORIDE (POUR BTL) OPTIME
TOPICAL | Status: DC | PRN
  Administered 2024-03-04: 1000 mL

## 2024-03-04 MED ORDER — DEXAMETHASONE SODIUM PHOSPHATE 10 MG/ML IJ SOLN
10.0000 mg | Freq: Once | INTRAMUSCULAR | Status: AC
  Administered 2024-03-05: 10 mg via INTRAVENOUS
  Filled 2024-03-04: qty 1

## 2024-03-04 MED ORDER — CEFAZOLIN SODIUM-DEXTROSE 2-4 GM/100ML-% IV SOLN
2.0000 g | Freq: Four times a day (QID) | INTRAVENOUS | Status: AC
  Administered 2024-03-04 (×2): 2 g via INTRAVENOUS
  Filled 2024-03-04 (×2): qty 100

## 2024-03-04 MED ORDER — APIXABAN 2.5 MG PO TABS
2.5000 mg | ORAL_TABLET | Freq: Two times a day (BID) | ORAL | Status: DC
  Administered 2024-03-05 – 2024-03-09 (×7): 2.5 mg via ORAL
  Filled 2024-03-04 (×8): qty 1

## 2024-03-04 MED ORDER — PROPRANOLOL HCL 40 MG PO TABS
80.0000 mg | ORAL_TABLET | Freq: Two times a day (BID) | ORAL | Status: DC
  Administered 2024-03-04 – 2024-03-09 (×10): 80 mg via ORAL
  Filled 2024-03-04: qty 4
  Filled 2024-03-04: qty 2
  Filled 2024-03-04: qty 4
  Filled 2024-03-04: qty 2
  Filled 2024-03-04 (×2): qty 4
  Filled 2024-03-04: qty 2
  Filled 2024-03-04: qty 4
  Filled 2024-03-04 (×2): qty 2
  Filled 2024-03-04 (×2): qty 4
  Filled 2024-03-04 (×3): qty 2
  Filled 2024-03-04: qty 4

## 2024-03-04 SURGICAL SUPPLY — 55 items
BAG COUNTER SPONGE SURGICOUNT (BAG) IMPLANT
BAG ZIPLOCK 12X15 (MISCELLANEOUS) ×1 IMPLANT
BLADE SAG 18X100X1.27 (BLADE) IMPLANT
BLADE SAW SAG 35X64 .89 (BLADE) ×1 IMPLANT
BLADE SAW SGTL 13.0X1.19X90.0M (BLADE) ×1 IMPLANT
BLADE SAW SGTL 18X1.27X75 (BLADE) ×1 IMPLANT
BOWL SMART MIX CTS (DISPOSABLE) IMPLANT
CEMENT BONE R 1X40 (Cement) IMPLANT
CEMENT BONE REFOBACIN R1X40 US (Cement) IMPLANT
COMPONENT FEM CMT PERS SZ11LT (Joint) IMPLANT
COMPONENT TIB KNEE PS G 0D LT (Joint) IMPLANT
COOLER ICEMAN CLASSIC (MISCELLANEOUS) ×1 IMPLANT
COVER SURGICAL LIGHT HANDLE (MISCELLANEOUS) ×1 IMPLANT
CUFF TRNQT CYL 34X4.125X (TOURNIQUET CUFF) ×1 IMPLANT
DERMABOND ADVANCED .7 DNX12 (GAUZE/BANDAGES/DRESSINGS) IMPLANT
DRAPE IMP U-DRAPE 54X76 (DRAPES) ×1 IMPLANT
DRAPE SHEET LG 3/4 BI-LAMINATE (DRAPES) ×1 IMPLANT
DRAPE SURG ORHT 6 SPLT 77X108 (DRAPES) ×2 IMPLANT
DRAPE U-SHAPE 47X51 STRL (DRAPES) ×2 IMPLANT
DRSG AQUACEL AG ADV 3.5X10 (GAUZE/BANDAGES/DRESSINGS) IMPLANT
DURAPREP 26ML APPLICATOR (WOUND CARE) ×3 IMPLANT
ELECT PENCIL ROCKER SW 15FT (MISCELLANEOUS) ×1 IMPLANT
ELECT REM PT RETURN 15FT ADLT (MISCELLANEOUS) ×1 IMPLANT
GAUZE SPONGE 4X4 12PLY STRL (GAUZE/BANDAGES/DRESSINGS) IMPLANT
GLOVE BIOGEL PI IND STRL 7.0 (GLOVE) ×2 IMPLANT
GLOVE BIOGEL PI IND STRL 7.5 (GLOVE) ×1 IMPLANT
GLOVE ECLIPSE 7.0 STRL STRAW (GLOVE) ×3 IMPLANT
GLOVE SURG SYN 7.5 PF PI (GLOVE) ×5 IMPLANT
GOWN STRL SURGICAL XL XLNG (GOWN DISPOSABLE) ×2 IMPLANT
HOOD PEEL AWAY T7 (MISCELLANEOUS) ×3 IMPLANT
INSERT ARTISURF S8-11 18X22X14 (Insert) IMPLANT
KIT TURNOVER KIT A (KITS) ×1 IMPLANT
MARKER SKIN DUAL TIP RULER LAB (MISCELLANEOUS) ×2 IMPLANT
NDL SAFETY ECLIPSE 18X1.5 (NEEDLE) IMPLANT
NDL SPNL 18GX3.5 QUINCKE PK (NEEDLE) ×1 IMPLANT
NEEDLE SPNL 18GX3.5 QUINCKE PK (NEEDLE) ×1 IMPLANT
NS IRRIG 1000ML POUR BTL (IV SOLUTION) ×2 IMPLANT
PAD COLD SHLDR WRAP-ON (PAD) ×1 IMPLANT
PIN DRILL HDLS TROCAR 75 4PK (PIN) IMPLANT
PROTECTOR NERVE ULNAR (MISCELLANEOUS) ×1 IMPLANT
SCREW FEMALE HEX FIX 25X2.5 (ORTHOPEDIC DISPOSABLE SUPPLIES) IMPLANT
SET HNDPC FAN SPRY TIP SCT (DISPOSABLE) ×1 IMPLANT
SOLUTION PRONTOSAN WOUND 350ML (IRRIGATION / IRRIGATOR) ×1 IMPLANT
SPIKE FLUID TRANSFER (MISCELLANEOUS) ×2 IMPLANT
STEM POLY PAT PLY 35M KNEE (Knees) IMPLANT
STRIP CLOSURE SKIN 1/2X4 (GAUZE/BANDAGES/DRESSINGS) ×2 IMPLANT
SUT ETHILON 2 0 PS N (SUTURE) ×3 IMPLANT
SUT MNCRL AB 3-0 PS2 18 (SUTURE) IMPLANT
SUT STRATAFIX PDS+ 0 24IN (SUTURE) IMPLANT
SUT VIC AB 0 CT1 36 (SUTURE) ×1 IMPLANT
SUT VIC AB 2-0 CT1 TAPERPNT 27 (SUTURE) ×3 IMPLANT
SYR 50ML LL SCALE MARK (SYRINGE) ×1 IMPLANT
TRAY CATH INTERMITTENT SS 16FR (CATHETERS) IMPLANT
WATER STERILE IRR 1000ML POUR (IV SOLUTION) ×2 IMPLANT
WRAP KNEE MAXI GEL POST OP (GAUZE/BANDAGES/DRESSINGS) ×1 IMPLANT

## 2024-03-04 NOTE — Transfer of Care (Signed)
 Immediate Anesthesia Transfer of Care Note  Patient: Thomas Mcneil  Procedure(s) Performed: ARTHROPLASTY, KNEE, TOTAL (Left: Knee)  Patient Location: PACU  Anesthesia Type:Spinal  Level of Consciousness: awake, drowsy, and patient cooperative  Airway & Oxygen Therapy: Patient Spontanous Breathing and Patient connected to face mask oxygen  Post-op Assessment: Report given to RN and Post -op Vital signs reviewed and stable  Post vital signs: Reviewed and stable  Last Vitals:  Vitals Value Taken Time  BP 116/70 03/04/24 11:49  Temp    Pulse 64 03/04/24 11:50  Resp 14 03/04/24 11:50  SpO2 98 % 03/04/24 11:50  Vitals shown include unfiled device data.  Last Pain:  Vitals:   03/04/24 0807  TempSrc:   PainSc: 8          Complications: No notable events documented.

## 2024-03-04 NOTE — Anesthesia Procedure Notes (Signed)
 Spinal  Patient location during procedure: OR Start time: 03/04/2024 9:55 AM End time: 03/04/2024 9:57 AM Staffing Performed: anesthesiologist  Anesthesiologist: Dorethea Cordella SQUIBB, DO Performed by: Dorethea Cordella SQUIBB, DO Authorized by: Dorethea Cordella SQUIBB, DO   Preanesthetic Checklist Completed: patient identified, IV checked, site marked, risks and benefits discussed, surgical consent, monitors and equipment checked, pre-op evaluation and timeout performed Spinal Block Patient position: sitting Prep: DuraPrep Patient monitoring: heart rate, cardiac monitor, continuous pulse ox and blood pressure Approach: midline Location: L3-4 Injection technique: single-shot Needle Needle type: Pencan  Needle gauge: 24 G Needle length: 10 cm Assessment Events: CSF return Additional Notes Patient identified. Risks/Benefits/Options discussed with patient including but not limited to bleeding, infection, nerve damage, paralysis, failed block, incomplete pain control, headache, blood pressure changes, nausea, vomiting, reactions to medications, itching and postpartum back pain. Confirmed with bedside nurse the patient's most recent platelet count. Confirmed with patient that they are not currently taking any anticoagulation, have any bleeding history or any family history of bleeding disorders. Patient expressed understanding and wished to proceed. All questions were answered. Sterile technique was used throughout the entire procedure. Please see nursing notes for vital signs. Warning signs of high block given to the patient including shortness of breath, tingling/numbness in hands, complete motor block, or any concerning symptoms with instructions to call for help. Patient was given instructions on fall risk and not to get out of bed. All questions and concerns addressed with instructions to call with any issues or inadequate analgesia.

## 2024-03-04 NOTE — Progress Notes (Signed)
 Orthopedic Tech Progress Note Patient Details:  Thomas Mcneil 10-24-1950 [id number]  Ortho Devices Type of Ortho Device: Bone foam zero knee Ortho Device/Splint Location: LLE Ortho Device/Splint Interventions: Application   Post Interventions Patient Tolerated: Well  Massie FORBES Yu Peggs 03/04/2024, 2:11 PM

## 2024-03-04 NOTE — Anesthesia Procedure Notes (Signed)
 Procedure Name: MAC Date/Time: 03/04/2024 9:50 AM  Performed by: Metta Andrea NOVAK, CRNAPre-anesthesia Checklist: Patient identified, Emergency Drugs available, Suction available, Patient being monitored and Timeout performed Oxygen Delivery Method: Simple face mask Placement Confirmation: positive ETCO2

## 2024-03-04 NOTE — H&P (Signed)
 PREOPERATIVE H&P  Chief Complaint: osteoarthritis of left knee  HPI: Thomas Mcneil is a 73 y.o. male who presents for surgical treatment of osteoarthritis of left knee.  He denies any changes in medical history.  Past Surgical History:  Procedure Laterality Date   DIRECT LARYNGOSCOPY  12/26/2011   Procedure: DIRECT LARYNGOSCOPY;  Surgeon: Merilee Kraft, MD;  Location: [facility] OR;  Service: ENT;  Laterality: N/A;  Directy Laryngoscopy with biopsy [id number]   DIRECT LARYNGOSCOPY N/A 10/21/2012   Procedure: DIRECT LARYNGOSCOPY;  Surgeon: Merilee Kraft, MD;  Location: [facility] OR;  Service: ENT;  Laterality: N/A;   ESOPHAGOGASTRODUODENOSCOPY N/A 10/25/2012   Procedure: ESOPHAGOGASTRODUODENOSCOPY (EGD);  Surgeon: Renaye Sous, MD;  Location: [facility] ENDOSCOPY;  Service: Endoscopy;  Laterality: N/A;   TONSILLECTOMY     TOTAL HIP ARTHROPLASTY Right 10/19/2023   Procedure: ARTHROPLASTY, HIP, TOTAL, ANTERIOR APPROACH;  Surgeon: Jerri Kay HERO, MD;  Location: [facility] OR;  Service: Orthopedics;  Laterality: Right;   Social History   Socioeconomic History   Marital status: Widowed    Spouse name: Not on file   Number of children: 1   Years of education: Not on file   Highest education level: Not on file  Occupational History   Not on file  Tobacco Use   Smoking status: Some Days    Current packs/day: 0.25    Average packs/day: 0.3 packs/day for 46.1 years (11.5 ttl pk-yrs)    Types: Cigarettes    Start date: 06/09/2012   Smokeless tobacco: Never   Tobacco comments:    quitting, using e-cig    05/12/2022- smokes on occasion  Vaping Use   Vaping status: Former  Substance and Sexual Activity   Alcohol  use: Not Currently    Alcohol /week: 70.0 standard drinks of alcohol     Types: 70 Cans of beer per week    Comment: [place] - currently in [facility] for counseling/rehab. last drink 06/29/14   Drug use: Not Currently    Frequency: 0.2 times per week    Types: Crack cocaine    Comment: Crack quit 06/29/14   Sexual  activity: Not on file  Other Topics Concern   Not on file  Social History Narrative   Are you right handed or left handed? Right Handed    Are you currently employed ? No    What is your current occupation?   Do you live at home alone? No    Who lives with you? Lives with mother    What type of home do you live in: 1 story or 2 story? Lives in a one story home        Social Drivers of Health   Financial Resource Strain: Not on file  Food Insecurity: No Food Insecurity (10/19/2023)   Hunger Vital Sign    Worried About Running Out of Food in the Last Year: Never true    Ran Out of Food in the Last Year: Never true  Transportation Needs: No Transportation Needs (10/19/2023)   PRAPARE - Administrator, Civil Service (Medical): No    Lack of Transportation (Non-Medical): No  Physical Activity: Not on file  Stress: Not on file  Social Connections: Socially Isolated (10/19/2023)   Social Connection and Isolation Panel    Frequency of Communication with Friends and Family: Three times a week    Frequency of Social Gatherings with Friends and Family: Once a week    Attends Religious Services: Never    Active Member of [employer]  or Organizations: No    Attends Banker Meetings: Never    Marital Status: Widowed   Family History  Problem Relation Age of Onset   Hypertension Mother    Diverticulosis Mother    GER disease Mother    Lung disease Sister    Polymyositis Sister    Alcohol  abuse Maternal Uncle    No Known Allergies Prior to Admission medications   Medication Sig Start Date End Date Taking? Authorizing Provider  Ascorbic Acid  (VITAMIN C ) 1000 MG tablet Take 1 tablet (1,000 mg total) by mouth daily. For Vitamin C  supplementation 09/13/15  Yes Nwoko, Mac I, NP  cyanocobalamin (VITAMIN B12) 500 MCG tablet Take 1,000 mcg by mouth every morning. 09/20/15  Yes [provider]  docusate sodium  (COLACE) 100 MG capsule Take 1 capsule (100 mg total) by  mouth daily as needed. 02/24/24 02/23/25 Yes Jule Ronal CROME, PA-C  DULoxetine  (CYMBALTA ) 60 MG capsule Take 60 mg by mouth 2 (two) times daily. 02/19/22  Yes [provider]  ferrous sulfate  325 (65 FE) MG EC tablet Take 325 mg by mouth daily with breakfast.   Yes [provider]  fluticasone  (FLONASE ) 50 MCG/ACT nasal spray Place 2 sprays into both nostrils daily as needed for allergies. For allergies 09/13/15  Yes Nwoko, Mac I, NP  ketoconazole (NIZORAL) 2 % cream Apply 1 Application topically 2 (two) times daily as needed for irritation. 11/09/23  Yes [provider]  lovastatin  (MEVACOR ) 20 MG tablet Take 1 tablet (20 mg total) by mouth at bedtime. For High Cholesterol 09/13/15  Yes Collene Mac I, NP  Magnesium  Citrate 200 MG TABS Take 200 mg by mouth daily at 12 noon.   Yes [provider]  Misc Natural Products (COMPLETE PROSTATE HEALTH) TABS Take 1 tablet by mouth in the morning and at bedtime.   Yes [provider]  Multiple Vitamin (MULTIVITAMIN WITH MINERALS) TABS tablet Take 1 tablet by mouth daily.   Yes [provider]  pantoprazole  (PROTONIX ) 40 MG tablet Take 40 mg by mouth 2 (two) times daily.   Yes [provider]  pregabalin  (LYRICA ) 225 MG capsule Take 225 mg by mouth 2 (two) times daily.   Yes [provider]  propranolol  (INDERAL ) 80 MG tablet Take 80 mg by mouth 2 (two) times daily. 12/18/23  Yes [provider]  traZODone  (DESYREL ) 100 MG tablet Take 2 tablets (200 mg total) by mouth at bedtime. For sleep Patient taking differently: Take 100 mg by mouth at bedtime. For sleep 09/13/15  Yes Collene Mac I, NP  amLODipine  (NORVASC ) 5 MG tablet Take 1 tablet by mouth daily. Patient not taking: No sig reported 07/28/23   [provider]  doxycycline  (VIBRA -TABS) 100 MG tablet Take 1 tablet (100 mg total) by mouth 2 (two) times daily. Patient not taking: No sig reported 12/01/23   Jule Ronal CROME, PA-C   methocarbamol  (ROBAXIN -750) 750 MG tablet Take 1 tablet (750 mg total) by mouth 3 (three) times daily as needed for muscle spasms. 02/24/24   Jule Ronal CROME, PA-C  ondansetron  (ZOFRAN ) 4 MG tablet Take 1 tablet (4 mg total) by mouth every 8 (eight) hours as needed for nausea or vomiting. 02/24/24   Jule Ronal CROME, PA-C  oxyCODONE -acetaminophen  (PERCOCET) 5-325 MG tablet Take 1-2 tablets by mouth every 6 (six) hours as needed. To be taken after surgery 02/24/24   Jule Ronal CROME, PA-C     Positive ROS: All other systems have been  reviewed and were otherwise negative with the exception of those mentioned in the HPI and as above.  Physical Exam: General: Alert, no acute distress Cardiovascular: No pedal edema Respiratory: No cyanosis, no use of accessory musculature GI: abdomen soft Skin: No lesions in the area of chief complaint Neurologic: Sensation intact distally Psychiatric: Patient is competent for consent with normal mood and affect Lymphatic: no lymphedema  MUSCULOSKELETAL: exam stable  Assessment: osteoarthritis of left knee  Plan: Plan for Procedure(s): ARTHROPLASTY, KNEE, TOTAL  The risks benefits and alternatives were discussed with the patient including but not limited to the risks of nonoperative treatment, versus surgical intervention including infection, bleeding, nerve injury,  blood clots, cardiopulmonary complications, morbidity, mortality, among others, and they were willing to proceed.   Ozell Cummins, MD 03/04/2024 7:17 AM

## 2024-03-04 NOTE — Anesthesia Procedure Notes (Signed)
 Anesthesia Regional Block: Adductor canal block   Pre-Anesthetic Checklist: , timeout performed,  Correct Patient, Correct Site, Correct Laterality,  Correct Procedure, Correct Position, site marked,  Risks and benefits discussed,  Surgical consent,  Pre-op evaluation,  At surgeon's request and post-op pain management  Laterality: Left  Prep: Dura Prep       Needles:  Injection technique: Single-shot  Needle Type: Echogenic Stimulator Needle     Needle Length: 10cm  Needle Gauge: 20     Additional Needles:   Procedures:,,,, ultrasound used (permanent image in chart),,    Narrative:  Start time: 03/04/2024 9:14 AM End time: 03/04/2024 9:17 AM Injection made incrementally with aspirations every 5 mL.  Performed by: Personally  Anesthesiologist: Dorethea Cordella SQUIBB, DO  Additional Notes: Patient identified. Risks/Benefits/Options discussed with patient including but not limited to bleeding, infection, nerve damage, failed block, incomplete pain control. Patient expressed understanding and wished to proceed. All questions were answered. Sterile technique was used throughout the entire procedure. Please see nursing notes for vital signs. Aspirated in 5cc intervals with injection for negative confirmation. Patient was given instructions on fall risk and not to get out of bed. All questions and concerns addressed with instructions to call with any issues or inadequate analgesia.

## 2024-03-04 NOTE — Discharge Instructions (Signed)

## 2024-03-04 NOTE — Op Note (Signed)
 Total Knee Arthroplasty Procedure Note  Preoperative diagnosis: Left knee osteoarthritis  Postoperative diagnosis:same  Operative findings: Severe OA Flexion contracture Valgus deformity  Operative procedure: Left total knee arthroplasty. CPT [phone]  Surgeon: N. Ozell Cummins, MD  Assist: Ronal Morna Grave, PA-C; necessary for the timely completion of procedure and due to complexity of procedure.  Anesthesia: Spinal, regional, local  Tourniquet time: see anesthesia record  Implants used: Zimmer persona Femur: CR 11 Tibia: G Patella: 35 mm Polyethylene: 10 mm medial congruent  Indication: Thomas Mcneil is a 73 y.o. year old male with a history of knee pain. Having failed conservative management, the patient elected to proceed with a total knee arthroplasty.  We have reviewed the risk and benefits of the surgery and they elected to proceed after voicing understanding.  Procedure:  After informed consent was obtained and understanding of the risk were voiced including but not limited to bleeding, infection, damage to surrounding structures including nerves and vessels, blood clots, leg length inequality and the failure to achieve desired results, the operative extremity was marked with verbal confirmation of the patient in the holding area.   The patient was then brought to the operating room and transported to the operating room table in the supine position.  A tourniquet was applied to the operative extremity around the upper thigh. The operative limb was then prepped and draped in the usual sterile fashion and preoperative antibiotics were administered.  A time out was performed prior to the start of surgery confirming the correct extremity, preoperative antibiotic administration, as well as team members, implants and instruments available for the case. Correct surgical site was also confirmed with preoperative radiographs. The limb was then elevated for exsanguination and the  tourniquet was inflated. A midline incision was made and a standard medial parapatellar approach was performed.  The infrapatellar fat pad was removed.  Suprapatellar synovium was removed to reveal the anterior distal femoral cortex.  A medial peel was performed to release the capsule and the deep MCL off of the medial tibial plateau back to the semimembranosus.  The patella was then everted which showed complete loss of articular cartilage and was resected down to 14 mm and sized to a 35 mm.  A cover was placed on the patella for protection from retractors.  The knee was then brought into flexion and we then turned our attention to the femur.  The ACL was sacrificed.  Start site was drilled in the femur and the intramedullary distal femoral cutting guide was placed, set at 5 degrees valgus, taking 12 mm of distal resection for the flexion contracture. The distal cut was made. Osteophytes were then removed.  Next, the proximal tibial cutting guide was placed with appropriate slope, varus/valgus alignment and depth of resection.  The drop rod was attached to confirm that it was aimed at the second metatarsal.  The proximal tibial cut was made taking 2 mm off the lower lateral side. Gap blocks were then used to assess the extension gap and alignment, and appropriate soft tissue releases were performed. Attention was turned back to the femur, which was sized using the sizing guide to a size 11 standard. Appropriate rotation of the femoral component was determined using epicondylar axis, Whiteside's line, and assessing the flexion gap under ligament tension. The appropriate size 4-in-1 cutting block was placed and checked with an angel wing and cuts were made. Posterior femoral osteophytes and uncapped bone were then removed with the curved osteotome.  The menisci  were removed.  Trial components were placed, and stability was checked in full extension, mid-flexion, and deep flexion.  PCL was resected to balance the  flexion space. Proper tibial rotation was determined and marked.  The patella tracked well without a lateral release.  The femoral lugs were then drilled. Trial components were then removed and tibial preparation performed.  The trial tibia was pointed to the medial third of the tibial tubercle.  The tibia was sized for a size G component and prepared.  Trial components were removed.    The bony surfaces were irrigated with a pulse lavage and then dried. Bone cement was vacuum mixed on the back table, and the final components sized above were cemented into place.  Antibiotic irrigation was placed in the knee joint and soft tissues while the cement cured.  After cement had finished curing, excess cement was removed. The stability of the construct was re-evaluated throughout a range of motion and found to be acceptable. The trial liner was removed, the knee was copiously irrigated, and the knee was re-evaluated for any excess bone debris. The real polyethylene liner, 10 mm thick, was inserted and checked to ensure the locking mechanism had engaged appropriately. The tourniquet was deflated and hemostasis was achieved. The wound was irrigated with normal saline.  One gram of vancomycin  powder was placed in the surgical bed.  Topical mixture of 0.25% bupivacaine  and meloxicam  was placed in the joint for postoperative pain.  Capsular closure was performed with a #1 stratafix in flexion, subcutaneous fat closed with a 0 vicryl suture, then subcutaneous tissue closed with interrupted 2.0 vicryl suture. The skin was then closed with a 2.0 nylon and dermabond. A sterile dressing was applied.  The patient was awakened in the operating room and taken to recovery in stable condition. All sponge, needle, and instrument counts were correct at the end of the case.  Morna Grave was necessary for opening, closing, retracting, limb positioning and overall facilitation and completion of the surgery.  Position: supine   Complications: none.  Time Out: performed   Drains/Packing: none Estimated blood loss: minimal Returned to Recovery Room: in good condition.   Mechanical VTE (DVT) Prophylaxis: sequential compression devices, TED thigh-high  Chemical VTE (DVT) Prophylaxis: eliquis   Fluid Replacement  Crystalloid: see anesthesia record Blood: none  FFP: none   Specimens Removed: 1 to pathology  Sponge and Instrument Count Correct? yes  PACU: portable radiograph - knee AP and Lateral  Plan/RTC: Return in 2 weeks for suture removal.  Weight Bearing/Load Lower Extremity: full   Implant Name Type Inv. Item Serial No. Manufacturer Lot No. LRB No. Used Action  CEMENT BONE REFOBACIN [id number] US  - [id number] Cement CEMENT BONE REFOBACIN R1X40 US   ZIMMER RECON(ORTH,TRAU,BIO,SG) [id number] Left 1 Implanted  CEMENT BONE REFOBACIN R1X40 US  - [id number] Cement CEMENT BONE REFOBACIN R1X40 US   ZIMMER RECON(ORTH,TRAU,BIO,SG) [id number] Left 1 Implanted  STEM POLY PAT PLY 16M KNEE - [id number] Knees STEM POLY PAT PLY 16M KNEE  ZIMMER RECON(ORTH,TRAU,BIO,SG) [id number] Left 1 Implanted  INSERT ARTISURF S8-11 [id number] - [id number] Insert INSERT ARTISURF S8-11 [id number]  ZIMMER RECON(ORTH,TRAU,BIO,SG) [id number] Left 1 Implanted  COMPONENT FEM CMT PERS [id number] - [id number] Joint COMPONENT FEM CMT PERS [id number]  ZIMMER RECON(ORTH,TRAU,BIO,SG) [id number] Left 1 Implanted  COMPONENT TIB KNEE PS G 0D LT - [id number] Joint COMPONENT TIB KNEE PS G 0D LT  ZIMMER RECON(ORTH,TRAU,BIO,SG) [id number] Left 1 Implanted    N. Ozell Cummins, MD [facility] 11:10 AM

## 2024-03-04 NOTE — Anesthesia Postprocedure Evaluation (Signed)
 Anesthesia Post Note  Patient: Thomas Mcneil  Procedure(s) Performed: ARTHROPLASTY, KNEE, TOTAL (Left: Knee)     Patient location during evaluation: PACU Anesthesia Type: Regional, MAC and Spinal Level of consciousness: awake and alert Pain management: pain level controlled Vital Signs Assessment: post-procedure vital signs reviewed and stable Respiratory status: spontaneous breathing, nonlabored ventilation, respiratory function stable and patient connected to nasal cannula oxygen Cardiovascular status: stable and blood pressure returned to baseline Postop Assessment: no apparent nausea or vomiting Anesthetic complications: no   No notable events documented.  Last Vitals:  Vitals:   03/04/24 1412 03/04/24 1610  BP: (!) 174/114 (!) 149/96  Pulse: 61 90  Resp: 18 18  Temp: 36.4 C   SpO2: 96% 96%    Last Pain:  Vitals:   03/04/24 1634  TempSrc:   PainSc: 3                  [name] P Tila Millirons

## 2024-03-04 NOTE — Evaluation (Signed)
 Physical Therapy Evaluation Patient Details Name: Thomas Mcneil MRN: [id number] DOB: [birth year] Today's Date: 03/04/2024  History of Present Illness  Pt is 73 yo L TKA on 03/04/24.  Pt with hx including but not limited to R THA 10/19/23, arthritis, COPD, DVT, HTN, MGUS, neuropathy, tremor  Clinical Impression  Pt is s/p TKA resulting in the deficits listed below (see PT Problem List). At baseline, pt resides with his elderly mother who is unable to assist.  He does have  a PCA 2 hr / day.  Pt has accessible home and DME.  Today, pt initially reporting sensation intact after spinal ; however, upon transfers was incontinent of urine and demonstrated decreased gluteal strength - likely from spinal.  Required mod x 2 for transfers and to stand, not able to take any steps today.  Pt additionally with chronic tremors bil UE , but reports they are further exacerbated right now.  Pt did progress well with his THA back in May, but may need increased time for TKA prior to return home.  Pt will benefit from acute skilled PT to increase their independence and safety with mobility to allow discharge.          If plan is discharge home, recommend the following: Two people to help with walking and/or transfers;Two people to help with bathing/dressing/bathroom   Can travel by private vehicle        Equipment Recommendations None recommended by PT  Recommendations for Other Services       Functional Status Assessment Patient has had a recent decline in their functional status and demonstrates the ability to make significant improvements in function in a reasonable and predictable amount of time.     Precautions / Restrictions Precautions Precautions: Fall;Knee Restrictions Weight Bearing Restrictions Per Provider Order: Yes LLE Weight Bearing Per Provider Order: Weight bearing as tolerated      Mobility  Bed Mobility Overal bed mobility: Needs Assistance Bed Mobility: Supine to Sit, Sit to Supine      Supine to sit: Mod assist Sit to supine: Min assist        Transfers Overall transfer level: Needs assistance Equipment used: Rolling walker (2 wheels) Transfers: Sit to/from Stand Sit to Stand: Mod assist, +2 physical assistance           General transfer comment: Mod A x 2 to stand, cues and assist for foot placement (to increase BOS), performed x 2. Pt not safe/ready to transfer to chair today -difficulty coordinating LE movement to take steps and required mod x 2 for balance.  Did shuffle 1 lateral step toward [facility] with assist.    Ambulation/Gait                  Stairs            Wheelchair Mobility     Tilt Bed    Modified Rankin (Stroke Patients Only)       Balance Overall balance assessment: Needs assistance Sitting-balance support: No upper extremity supported Sitting balance-Leahy Scale: Fair     Standing balance support: Bilateral upper extremity supported Standing balance-Leahy Scale: Poor Standing balance comment: Leaning L requiring mod A x 2 - some improvement with assist to tuck R hip and cues but still needing significant assistance.  Pt reports sensation intact from spinal but was still incontinent and weak gluteal muscles upon standing.  Pertinent Vitals/Pain Pain Assessment Pain Assessment: Faces Faces Pain Scale: Hurts little more Pain Location: L knee Pain Descriptors / Indicators: Discomfort Pain Intervention(s): Limited activity within patient's tolerance, Monitored during session, Premedicated before session, Repositioned    Home Living Family/patient expects to be discharged to:: Private residence Living Arrangements: Parent Available Help at Discharge: Personal care attendant;Available PRN/intermittently (PCA 2 hr /day everyday) Type of Home: House Home Access: Ramped entrance       Home Layout: One level Home Equipment: Rollator (4 wheels);Shower Counsellor (2  wheels)      Prior Function Prior Level of Function : Independent/Modified Independent             Mobility Comments: Using rollator able to ambulate in community ADLs Comments: Pt independent with ADLS; aide assist with IADLs     Extremity/Trunk Assessment   Upper Extremity Assessment Upper Extremity Assessment:  (Significant bil UE tremor basline)    Lower Extremity Assessment Lower Extremity Assessment: LLE deficits/detail;RLE deficits/detail RLE Deficits / Details: ROM WFL; MMT: ankle 5/5, knee 5/5, hip flex 4/5; Pt reports sensation intact from spinal but was still incontinent and weak gluteal muscles upon standing. LLE Deficits / Details: Expected post op changes; ROM L knee ~5 to 85 degrees; MMT ankle 5/5, knee and hip 3/5 not further tested; Pt reports sensation intact from spinal but was still incontinent and weak gluteal muscles upon standing.    Cervical / Trunk Assessment Cervical / Trunk Assessment: Normal  Communication        Cognition Arousal: Alert Behavior During Therapy: WFL for tasks assessed/performed   PT - Cognitive impairments: No apparent impairments                                 Cueing       General Comments      Exercises     Assessment/Plan    PT Assessment Patient needs continued PT services  PT Problem List Decreased strength;Decreased range of motion;Decreased activity tolerance;Decreased balance;Decreased knowledge of use of DME;Pain;Decreased mobility       PT Treatment Interventions DME instruction;Therapeutic exercise;Gait training;Functional mobility training;Therapeutic activities;Patient/family education;Modalities;Balance training    PT Goals (Current goals can be found in the Care Plan section)  Acute Rehab PT Goals Patient Stated Goal: return home PT Goal Formulation: With patient Time For Goal Achievement: 03/18/24 Potential to Achieve Goals: Good    Frequency 7X/week     Co-evaluation                AM-PAC PT 6 Clicks Mobility  Outcome Measure Help needed turning from your back to your side while in a flat bed without using bedrails?: A Lot Help needed moving from lying on your back to sitting on the side of a flat bed without using bedrails?: A Lot Help needed moving to and from a bed to a chair (including a wheelchair)?: Total Help needed standing up from a chair using your arms (e.g., wheelchair or bedside chair)?: Total Help needed to walk in hospital room?: Total Help needed climbing 3-5 steps with a railing? : Total 6 Click Score: 8    End of Session Equipment Utilized During Treatment: Gait belt Activity Tolerance: Treatment limited secondary to medical complications (Comment) (still with some effects of spinal) Patient left: in bed;with call bell/phone within reach;with bed alarm set;with nursing/sitter in room Nurse Communication: Mobility status PT Visit Diagnosis: Other abnormalities of gait and mobility (  R26.89);Muscle weakness (generalized) (M62.81)    Time: 8549-8486 PT Time Calculation (min) (ACUTE ONLY): 23 min   Charges:   PT Evaluation $PT Eval Moderate Complexity: 1 Mod PT Treatments $Therapeutic Activity: 8-22 mins PT General Charges $$ ACUTE PT VISIT: 1 Visit         Benjiman, PT Acute Rehab [facility] [phone]   Benjiman VEAR Mulberry 03/04/2024, 4:17 PM

## 2024-03-05 DIAGNOSIS — M1712 Unilateral primary osteoarthritis, left knee: Secondary | ICD-10-CM | POA: Diagnosis not present

## 2024-03-05 NOTE — Evaluation (Signed)
 Occupational Therapy Evaluation Patient Details Name: Thomas Mcneil MRN: [id number] DOB: 08/28/1950 Today's Date: 03/05/2024   History of Present Illness   Pt is 73 yo L TKA on 03/04/24.  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 He relies on his aide for cooking. Pt reports being able to mow his lawn. He lives with his mother who cannot provide physical assistance. Pt presents with L knee pain, generalized weakness, impaired standing balance with heavy reliance on UEs and baseline UE tremor. He require min assist to stand from recliner with cues for technique. He needs set up to max assist for ADLs. Will follow acutely.      If plan is discharge home, recommend the following:   A little help with walking and/or transfers;A lot of help with bathing/dressing/bathroom;Assistance with cooking/housework;Assist for transportation;Help with stairs or ramp for entrance     Functional Status Assessment   Patient has had a recent decline in their functional status and demonstrates the ability to make significant improvements in function in a reasonable and predictable amount of time.     Equipment Recommendations   BSC/3in1     Recommendations for Other Services         Precautions/Restrictions   Precautions Precautions: Fall;Knee Restrictions Weight Bearing Restrictions Per Provider Order: No LLE Weight Bearing Per Provider Order: Weight bearing as tolerated     Mobility Bed Mobility               General bed mobility comments: received in chair    Transfers Overall transfer level: Needs assistance Equipment used: Rolling walker (2 wheels) Transfers: Sit to/from Stand Sit to Stand: Min assist           General transfer comment: cues for technique, assist to rise and steady, reliant on  B UE support      Balance Overall balance assessment: Needs assistance Sitting-balance support: No upper extremity supported, Feet supported Sitting balance-Leahy Scale: Fair     Standing balance support: Bilateral upper extremity supported Standing balance-Leahy Scale: Poor Standing balance comment: reliant on B UE and min assist in static standing                           ADL either performed or assessed with clinical judgement   ADL Overall ADL's : Needs assistance/impaired Eating/Feeding: Independent;Sitting Eating/Feeding Details (indicate cue type and reason): increased spillage due to tremor, has many types of bowls and utensils and has tried stabilizing his elbow to self feed Grooming: Set up;Standing;Sitting;Wash/dry hands   Upper Body Bathing: Set up;Sitting   Lower Body Bathing: Sit to/from stand;Maximal assistance   Upper Body Dressing : Set up;Sitting   Lower Body Dressing: Sit to/from stand;Maximal assistance                       Vision Ability to See in Adequate Light: 0 Adequate Patient Visual Report: No change from baseline       Perception         Praxis         Pertinent Vitals/Pain Pain Assessment Pain Assessment: Faces Faces Pain Scale: Hurts little more Pain Location: L knee Pain Descriptors / Indicators: Sore Pain Intervention(s): Monitored during session, Repositioned     Extremity/Trunk Assessment Upper Extremity  Assessment Upper Extremity Assessment: Right hand dominant;RUE deficits/detail;LUE deficits/detail RUE Deficits / Details: baseline tremor RUE Coordination: decreased fine motor LUE Deficits / Details: baseline tremor LUE Coordination: decreased fine motor   Lower Extremity Assessment Lower Extremity Assessment: Defer to PT evaluation   Cervical / Trunk Assessment Cervical / Trunk Assessment: Normal   Communication Communication Communication: No apparent difficulties   Cognition Arousal:  Alert Behavior During Therapy: WFL for tasks assessed/performed Cognition: No apparent impairments                               Following commands: Intact       Cueing  General Comments   Cueing Techniques: Verbal cues      Exercises     Shoulder Instructions      Home Living Family/patient expects to be discharged to:: Private residence Living Arrangements: Parent Available Help at Discharge: Personal care attendant;Available PRN/intermittently (2 hours a day, only helps with cooking) Type of Home: House Home Access: Ramped entrance     Home Layout: One level     Bathroom Shower/Tub: Tub/shower unit         Home Equipment: Rollator (4 wheels);Shower Counsellor (2 wheels);Grab bars - tub/shower          Prior Functioning/Environment Prior Level of Function : Independent/Modified Independent             Mobility Comments: Using rollator able to ambulate in community ADLs Comments: Pt independent with ADLS; aide assist with IADLs, states he can sweep and mow, avoids socks, wears slip on shoes.    OT Problem List: Decreased strength;Impaired balance (sitting and/or standing);Decreased knowledge of use of DME or AE;Pain   OT Treatment/Interventions: Self-care/ADL training;DME and/or AE instruction;Therapeutic activities;Patient/family education;Balance training      OT Goals(Current goals can be found in the care plan section)   Acute Rehab OT Goals OT Goal Formulation: With patient Time For Goal Achievement: 03/19/24 Potential to Achieve Goals: Good ADL Goals Pt Will Perform Grooming: with supervision;standing Pt Will Perform Lower Body Bathing: with supervision;sit to/from stand;with adaptive equipment Pt Will Perform Lower Body Dressing: with adaptive equipment;sit to/from stand;with supervision Pt Will Transfer to Toilet: ambulating;bedside commode;with supervision Pt Will Perform Toileting - Clothing Manipulation and  hygiene: sit to/from stand;with supervision Additional ADL Goal #1: Pt will complete bed mobility mod I in preparation for ADLs.   OT Frequency:  Min 2X/week    Co-evaluation              AM-PAC OT 6 Clicks Daily Activity     Outcome Measure Help from another person eating meals?: None Help from another person taking care of personal grooming?: A Little Help from another person toileting, which includes using toliet, bedpan, or urinal?: A Lot Help from another person bathing (including washing, rinsing, drying)?: A Lot Help from another person to put on and taking off regular upper body clothing?: A Little Help from another person to put on and taking off regular lower body clothing?: A Lot 6 Click Score: 16   End of Session Equipment Utilized During Treatment: Rolling walker (2 wheels);Gait belt  Activity Tolerance: Patient tolerated treatment well Patient left: in chair;with call bell/phone within reach;with chair alarm set  OT Visit Diagnosis: Unsteadiness on feet (R26.81);Other abnormalities of gait and mobility (R26.89);Pain Pain - Right/Left: Left Pain - part of body: Knee  Time: 8767-8751 OT Time Calculation (min): 16 min Charges:  OT General Charges $OT Visit: 1 Visit OT Evaluation $OT Eval Moderate Complexity: 1 Mod  Mliss HERO, OTR/L Acute Rehabilitation Services Office: [phone]   Kennth Mliss Helling 03/05/2024, 1:25 PM

## 2024-03-05 NOTE — Care Management Obs Status (Signed)
 MEDICARE OBSERVATION STATUS NOTIFICATION   Patient Details  Name: Thomas Mcneil MRN: [id number] Date of Birth: Mar 20, 1951   Medicare Observation Status Notification Given:  Yes    Sheri ONEIDA Sharps, LCSW 03/05/2024, 1:22 PM

## 2024-03-05 NOTE — Progress Notes (Signed)
 Orthopedic Surgery Progress Note   Assessment: Patient is a 73 y.o. male with left knee OA status post TKA   Plan: -Operative plans: complete -Diet: Diabetic -DVT ppx: Eliquis  -Antibiotics: Ancef  post-op doses -Weight bearing status: As tolerated -PT/OT evaluate and treat -Pain control - Anticipate discharge to home today  ___________________________________________________________________________  Subjective: No acute events overnight.  Pain well-controlled.  No complaints this morning.    Physical Exam:  General: no acute distress, appears stated age Neurologic: alert, answering questions appropriately, following commands Respiratory: unlabored breathing on room air, symmetric chest rise Psychiatric: appropriate affect, normal cadence to speech  MSK:    -Left lower extremity  Dressing over knee c/d/i EHL/TA/GSC intact Plantarflexes and dorsiflexes toes Sensation intact to light touch in sural, saphenous, tibial, deep peroneal, and superficial peroneal nerve distributions (decreased in all distributions from baseline neuropathy) Foot warm and well perfused   Yesterday's total administered Morphine  Milligram Equivalents: 72.5   Patient name: Thomas Mcneil Patient MRN: [id number] Date: 03/05/24

## 2024-03-05 NOTE — Progress Notes (Addendum)
 Physical Therapy Treatment Patient Details Name: Thomas Mcneil MRN: [id number] DOB: September 18, 1950 Today's Date: 03/05/2024   History of Present Illness Pt is 73 yo L TKA on 03/04/24.  Pt with hx including but not limited to R THA 10/19/23, arthritis, COPD, DVT, HTN, MGUS, neuropathy, tremor    PT Comments  Pt able to amb 11' with RW  and 2 person min assist. Distance limited by fatigue. Overall much improved from eval however pt will likely need another day to work towards safe d/c home. Pt lives with his mother who is unable to provide any physical assist. RN aware   If plan is discharge home, recommend the following: A lot of help with walking and/or transfers;A lot of help with bathing/dressing/bathroom;Help with stairs or ramp for entrance   Can travel by private vehicle        Equipment Recommendations  None recommended by PT    Recommendations for Other Services       Precautions / Restrictions Precautions Precautions: Fall;Knee Restrictions Weight Bearing Restrictions Per Provider Order: No LLE Weight Bearing Per Provider Order: Weight bearing as tolerated     Mobility  Bed Mobility Overal bed mobility: Needs Assistance Bed Mobility: Supine to Sit     Supine to sit: Min assist     General bed mobility comments: for trunk elevation and safety    Transfers Overall transfer level: Needs assistance Equipment used: Rolling walker (2 wheels) Transfers: Sit to/from Stand Sit to Stand: Min assist, +2 safety/equipment           General transfer comment: cues for safety and hand placement. assist to rise and steady    Ambulation/Gait Ambulation/Gait assistance: Min assist, +2 physical assistance, +2 safety/equipment Gait Distance (Feet): 11 Feet Assistive device: Rolling walker (2 wheels) Gait Pattern/deviations: Step-to pattern, Trunk flexed       General Gait Details: pt with ataxic type gait requiring incr time, min assist of 2 to balance, manuever RW.  multi-modal cues for sequence, foot placement and RW position. fatigues quickly   Stairs             Wheelchair Mobility     Tilt Bed    Modified Rankin (Stroke Patients Only)       Balance   Sitting-balance support: No upper extremity supported, Feet supported Sitting balance-Leahy Scale: Fair     Standing balance support: Bilateral upper extremity supported, Reliant on assistive device for balance, During functional activity Standing balance-Leahy Scale: Poor Standing balance comment: unsteady static stand with bil UE support/postural sway requiring assist/tactile input for midline                            Communication Communication Communication: No apparent difficulties  Cognition Arousal: Alert Behavior During Therapy: WFL for tasks assessed/performed   PT - Cognitive impairments: No apparent impairments                         Following commands: Intact      Cueing Cueing Techniques: Verbal cues  Exercises  Quad sets x7 bil LEs Ankle pumps x 10    General Comments        Pertinent Vitals/Pain Pain Assessment Pain Assessment: Faces Faces Pain Scale: Hurts little more Pain Location: L knee Pain Descriptors / Indicators: Sore Pain Intervention(s): Limited activity within patient's tolerance, Monitored during session, Premedicated before session, Repositioned, Ice applied    Home Living  Prior Function            PT Goals (current goals can now be found in the care plan section) Acute Rehab PT Goals Patient Stated Goal: return home PT Goal Formulation: With patient Time For Goal Achievement: 03/18/24 Potential to Achieve Goals: Good Progress towards PT goals: Progressing toward goals    Frequency    7X/week      PT Plan      Co-evaluation              AM-PAC PT 6 Clicks Mobility   Outcome Measure  Help needed turning from your back to your side while in a flat  bed without using bedrails?: A Lot Help needed moving from lying on your back to sitting on the side of a flat bed without using bedrails?: A Lot Help needed moving to and from a bed to a chair (including a wheelchair)?: A Lot Help needed standing up from a chair using your arms (e.g., wheelchair or bedside chair)?: A Lot Help needed to walk in hospital room?: A Lot Help needed climbing 3-5 steps with a railing? : Total 6 Click Score: 11    End of Session Equipment Utilized During Treatment: Gait belt Activity Tolerance: Patient limited by fatigue Patient left: in chair;with call bell/phone within reach;with chair alarm set Nurse Communication: Mobility status PT Visit Diagnosis: Other abnormalities of gait and mobility (R26.89);Muscle weakness (generalized) (M62.81)     Time: 8977-8966 PT Time Calculation (min) (ACUTE ONLY): 11 min  Charges:    $Gait Training: 8-22 mins PT General Charges $$ ACUTE PT VISIT: 1 Visit                     Jesselee Poth, PT  Acute Rehab Dept [facility]) [phone]  03/05/2024    [facility] [place] 03/05/2024, 12:30 PM

## 2024-03-05 NOTE — Progress Notes (Signed)
 PT TX NOTE  03/05/24 1400  PT Visit Information  Last PT Received On 03/05/24  Pt progressing well this session, continues to require min assist to +2 min A at times; activity tol improved with pt able to amb 40' with RW and review TKA exercises. Continue PT POC.  Assistance Needed +2 (progression)  History of Present Illness Pt is 73 yo L TKA on 03/04/24.  Pt with hx including but not limited to R THA 10/19/23, arthritis, COPD, DVT, HTN, MGUS, neuropathy, tremor  Subjective Data  Patient Stated Goal return home  Precautions  Precautions Fall;Knee  Restrictions  Weight Bearing Restrictions Per Provider Order No  LLE Weight Bearing Per Provider Order WBAT  Pain Assessment  Pain Assessment Faces  Faces Pain Scale 4  Pain Location L knee  Pain Descriptors / Indicators Sore  Pain Intervention(s) Limited activity within patient's tolerance;Monitored during session;Ice applied  Cognition  Arousal Alert  Behavior During Therapy WFL for tasks assessed/performed  PT - Cognitive impairments No apparent impairments  Following Commands  Following commands Intact  Cueing  Cueing Techniques Verbal cues  Communication  Communication No apparent difficulties  Bed Mobility  General bed mobility comments received in chair and returned to same  Transfers  Overall transfer level Needs assistance  Equipment used Rolling walker (2 wheels)  Transfers Sit to/from Stand  Sit to Stand Min assist  General transfer comment cues for technique, assist to rise and steady, reliant on B UE support  Ambulation/Gait  Ambulation/Gait assistance Min assist;+2 physical assistance;+2 safety/equipment  Gait Distance (Feet) 40 Feet  Assistive device Rolling walker (2 wheels)  Gait Pattern/deviations Step-to pattern;Trunk flexed  General Gait Details pt with ataxic type gait requiring incr time, mild scissoring at times;  min assist of 1-2 for balance and safety, manuever RW. multi-modal cues for sequence, foot  placement and RW position.  Total Joint Exercises  Ankle Circles/Pumps AROM;Both;10 reps  Quad Sets AROM;Both;10 reps  Heel Slides AAROM;Left;10 reps;AROM  Hip ABduction/ADduction AROM;Left;AAROM;10 reps  Straight Leg Raises AROM;Strengthening;Left;10 reps  Goniometric ROM grossly 5 to 80 degrees knee flexion  PT - End of Session  Equipment Utilized During Treatment Gait belt  Activity Tolerance Patient tolerated treatment well  Patient left in chair;with call bell/phone within reach;with chair alarm set  Nurse Communication Mobility status   PT - Assessment/Plan  PT Visit Diagnosis Other abnormalities of gait and mobility (R26.89);Muscle weakness (generalized) (M62.81)  PT Frequency (ACUTE ONLY) 7X/week  Follow Up Recommendations Follow physician's recommendations for discharge plan and follow up therapies  Patient can return home with the following A lot of help with walking and/or transfers;A lot of help with bathing/dressing/bathroom;Help with stairs or ramp for entrance  PT equipment None recommended by PT  AM-PAC PT 6 Clicks Mobility Outcome Measure (Version 2)  Help needed turning from your back to your side while in a flat bed without using bedrails? 2  Help needed moving from lying on your back to sitting on the side of a flat bed without using bedrails? 2  Help needed moving to and from a bed to a chair (including a wheelchair)? 2  Help needed standing up from a chair using your arms (e.g., wheelchair or bedside chair)? 2  Help needed to walk in hospital room? 2  Help needed climbing 3-5 steps with a railing?  2  6 Click Score 12  Consider Recommendation of Discharge To: CIR/SNF/LTACH  PT Goal Progression  Progress towards PT goals Progressing toward goals  Acute  Rehab PT Goals  PT Goal Formulation With patient  Time For Goal Achievement 03/18/24  Potential to Achieve Goals Good  PT Time Calculation  PT Start Time (ACUTE ONLY) 1401  PT Stop Time (ACUTE ONLY) 1418  PT  Time Calculation (min) (ACUTE ONLY) 17 min  PT General Charges  $$ ACUTE PT VISIT 1 Visit  PT Treatments  $Gait Training 8-22 mins

## 2024-03-05 NOTE — Progress Notes (Signed)
 Patient started choking while eating breakfast this morning. Thomas Mcneil was set up and kept bedside. Patient stated that this happens to him often. MD was notified. MD stated that at this time he didn't believe that a swallow study was necessary because this is patients baseline and changing his diet would be necessary if patient were staying in the hospital. However patient has discharge orders at this time and will return home today.

## 2024-03-05 NOTE — Plan of Care (Signed)
  Problem: Pain Management: Goal: Pain level will decrease with appropriate interventions Outcome: Progressing   Problem: Activity: Goal: Range of joint motion will improve Outcome: Progressing   Problem: Education: Goal: Knowledge of the prescribed therapeutic regimen will improve Outcome: Progressing   Problem: Safety: Goal: Ability to remain free from injury will improve Outcome: Progressing   Problem: Elimination: Goal: Will not experience complications related to urinary retention Outcome: Progressing

## 2024-03-06 DIAGNOSIS — M1712 Unilateral primary osteoarthritis, left knee: Secondary | ICD-10-CM | POA: Diagnosis not present

## 2024-03-06 NOTE — Plan of Care (Signed)
  Problem: Clinical Measurements: Goal: Will remain free from infection 03/06/2024 0751 by Leontine Ricky BROCKS, RN Outcome: Progressing 03/06/2024 0745 by Leontine Ricky BROCKS, RN Outcome: Progressing   Problem: Activity: Goal: Risk for activity intolerance will decrease 03/06/2024 0751 by Leontine Ricky BROCKS, RN Outcome: Progressing 03/06/2024 0745 by Leontine Ricky BROCKS, RN Outcome: Progressing   Problem: Safety: Goal: Ability to remain free from injury will improve 03/06/2024 0751 by Leontine Ricky BROCKS, RN Outcome: Progressing 03/06/2024 0745 by Leontine Ricky BROCKS, RN Outcome: Progressing

## 2024-03-06 NOTE — Progress Notes (Signed)
 Physical Therapy Treatment Patient Details Name: Thomas Mcneil MRN: [id number] DOB: 1950-12-30 Today's Date: 03/06/2024   History of Present Illness Pt is 73 yo L TKA on 03/04/24.  Pt with hx including but not limited to R THA 10/19/23, arthritis, COPD, DVT, HTN, MGUS, neuropathy, tremor    PT Comments   pt initially declined any activity; agreed to OOB to chair with encouragement. pt requests assist to use urinal as he is unable d/t UE tremors. Will continue attempts to progress pt. Pt will need to be at or near supervision level to d/c home with his mother    If plan is discharge home, recommend the following: A lot of help with walking and/or transfers;A lot of help with bathing/dressing/bathroom;Help with stairs or ramp for entrance   Can travel by private vehicle        Equipment Recommendations  None recommended by PT    Recommendations for Other Services       Precautions / Restrictions Precautions Precautions: Fall;Knee Restrictions Weight Bearing Restrictions Per Provider Order: No LLE Weight Bearing Per Provider Order: Weight bearing as tolerated     Mobility  Bed Mobility Overal bed mobility: Needs Assistance Bed Mobility: Supine to Sit     Supine to sit: Contact guard     General bed mobility comments: for safety to elevate trunk, incr time needed    Transfers Overall transfer level: Needs assistance Equipment used: Rolling walker (2 wheels) Transfers: Sit to/from Stand, Bed to chair/wheelchair/BSC Sit to Stand: Min assist, From elevated surface   Step pivot transfers: Min assist       General transfer comment: cues for technique, assist to rise and steady. assist to steady for stand pivot    Ambulation/Gait               General Gait Details:  (pt declines d/t fatigue)   Stairs             Wheelchair Mobility     Tilt Bed    Modified Rankin (Stroke Patients Only)       Balance   Sitting-balance support: No upper extremity  supported, Feet supported Sitting balance-Leahy Scale: Fair     Standing balance support: Bilateral upper extremity supported Standing balance-Leahy Scale: Poor Standing balance comment: reliant on B UE and min assist in static standing                            Communication Communication Communication: No apparent difficulties  Cognition Arousal: Alert Behavior During Therapy: WFL for tasks assessed/performed   PT - Cognitive impairments: No apparent impairments                         Following commands: Intact      Cueing Cueing Techniques: Verbal cues  Exercises Total Joint Exercises Ankle Circles/Pumps: AROM, Both, 10 reps Quad Sets: AROM, Both, 10 reps    General Comments General comments (skin integrity, edema, etc.): pt initially declines any activity; agrees with encouragement. pt requests assist to use urinal as he is unable d/t UE tremors      Pertinent Vitals/Pain Pain Assessment Pain Assessment: No/denies pain Faces Pain Scale: Hurts a little bit Pain Location: L knee Pain Descriptors / Indicators: Grimacing, Guarding Pain Intervention(s): Limited activity within patient's tolerance, Monitored during session, Repositioned, Ice applied    Home Living  Prior Function            PT Goals (current goals can now be found in the care plan section) Acute Rehab PT Goals Patient Stated Goal: return home PT Goal Formulation: With patient Time For Goal Achievement: 03/18/24 Potential to Achieve Goals: Good Progress towards PT goals: Progressing toward goals    Frequency    7X/week      PT Plan      Co-evaluation              AM-PAC PT 6 Clicks Mobility   Outcome Measure  Help needed turning from your back to your side while in a flat bed without using bedrails?: A Lot Help needed moving from lying on your back to sitting on the side of a flat bed without using bedrails?: A  Lot Help needed moving to and from a bed to a chair (including a wheelchair)?: A Lot Help needed standing up from a chair using your arms (e.g., wheelchair or bedside chair)?: A Lot Help needed to walk in hospital room?: A Lot Help needed climbing 3-5 steps with a railing? : A Lot 6 Click Score: 12    End of Session Equipment Utilized During Treatment: Gait belt Activity Tolerance: Patient tolerated treatment well Patient left: in chair;with call bell/phone within reach;with chair alarm set Nurse Communication: Mobility status PT Visit Diagnosis: Other abnormalities of gait and mobility (R26.89);Muscle weakness (generalized) (M62.81)     Time: 8961-8941 PT Time Calculation (min) (ACUTE ONLY): 20 min  Charges:    $Therapeutic Activity: 8-22 mins PT General Charges $$ ACUTE PT VISIT: 1 Visit                     Alyiah Ulloa, PT  Acute Rehab Dept [facility]) [phone]  03/06/2024    [facility] 03/06/2024, 11:04 AM

## 2024-03-06 NOTE — Progress Notes (Signed)
 Orthopedic Surgery Progress Note   Assessment: Patient is a 73 y.o. male with left knee OA status post TKA   Plan: -Operative plans: complete -Diet: Diabetic -DVT ppx: Eliquis  -Antibiotics: Ancef  post-op doses -Weight bearing status: As tolerated -PT/OT evaluate and treat -Pain control -Anticipate discharge to home today  ___________________________________________________________________________  Subjective: Only mobilized short distances yesterday with PT so did not discharge yesterday. No acute events overnight. Pain well controlled this morning. Was able to get some sleep last night.     Physical Exam:  General: no acute distress, appears stated age Neurologic: alert, answering questions appropriately, following commands Respiratory: unlabored breathing on room air, symmetric chest rise Psychiatric: appropriate affect, normal cadence to speech  MSK:    -Left lower extremity  Dressing over knee c/d/i EHL/TA/GSC intact Plantarflexes and dorsiflexes toes Sensation intact to light touch in sural, saphenous, tibial, deep peroneal, and superficial peroneal nerve distributions (decreased in all distributions from baseline neuropathy) Foot warm and well perfused   Yesterday's total administered Morphine  [place] Equivalents: 50   Patient name: Thomas Mcneil Patient MRN: [id number] Date: 03/06/24

## 2024-03-06 NOTE — Progress Notes (Signed)
 Physical Therapy Treatment Patient Details Name: Thomas Mcneil MRN: [id number] DOB: 01/19/1951 Today's Date: 03/06/2024   History of Present Illness Pt is 73 yo L TKA on 03/04/24.  Pt with hx including but not limited to R THA 10/19/23, arthritis, COPD, DVT, HTN, MGUS, neuropathy, tremor    PT Comments  Pt is making limited progress today, reports incr pain with mobility and is requiring +2 min to mod assist for amb x 20'---->+2 assist to balance, wt shift and for UE approximation to control UE tremors and RW position. distance ltd by fatigue and pain. I f continued slow progress pt may need SNF; his mother is unable to provide physical assist. In addition, pt is requiring heavy assist for ADLs, unable to feed himself or use the urinal/toileting d/t tremors (he reports they are worse as day progresses)   If plan is discharge home, recommend the following: A lot of help with walking and/or transfers;A lot of help with bathing/dressing/bathroom;Help with stairs or ramp for entrance   Can travel by private vehicle        Equipment Recommendations  None recommended by PT    Recommendations for Other Services       Precautions / Restrictions Precautions Precautions: Fall;Knee Restrictions Weight Bearing Restrictions Per Provider Order: No LLE Weight Bearing Per Provider Order: Weight bearing as tolerated     Mobility  Bed Mobility Overal bed mobility: Needs Assistance Bed Mobility: Supine to Sit, Sit to Supine     Supine to sit: Min assist Sit to supine: Min assist   General bed mobility comments: assist to progress RLE on and off bed    Transfers Overall transfer level: Needs assistance Equipment used: Rolling walker (2 wheels) Transfers: Sit to/from Stand, Bed to chair/wheelchair/BSC Sit to Stand: Min assist, From elevated surface, +2 safety/equipment, +2 physical assistance, Mod assist   Step pivot transfers: Min assist       General transfer comment: ongoing education  for technique, assist to rise and steady.    Ambulation/Gait Ambulation/Gait assistance: Min assist, +2 physical assistance, +2 safety/equipment Gait Distance (Feet): 25 Feet   Gait Pattern/deviations: Step-to pattern, Narrow base of support Gait velocity: decr     General Gait Details: pt requiring assist to balance, wt shift and UE approximation to control tremors and RW position. distance ltd by fatigue and pain   Stairs             Wheelchair Mobility     Tilt Bed    Modified Rankin (Stroke Patients Only)       Balance   Sitting-balance support: No upper extremity supported, Feet supported Sitting balance-Leahy Scale: Fair     Standing balance support: Bilateral upper extremity supported Standing balance-Leahy Scale: Poor Standing balance comment: reliant on B UE and min assist in static standing                            Communication Communication Communication: No apparent difficulties  Cognition Arousal: Alert Behavior During Therapy: WFL for tasks assessed/performed   PT - Cognitive impairments: No apparent impairments                         Following commands: Intact      Cueing Cueing Techniques: Verbal cues  Exercises Total Joint Exercises Ankle Circles/Pumps: AROM, Both, 10 reps Quad Sets: AROM, Both, 10 reps Heel Slides: Limitations Heel Slides Limitations: pain. low threshold for  flexion  at this time; encouraged pt to  work on knee flexion exercises after pain level decreases    General Comments        Pertinent Vitals/Pain Pain Assessment Pain Assessment: Faces Faces Pain Scale: Hurts whole lot Pain Location: L knee Pain Descriptors / Indicators: Grimacing, Guarding, Sore Pain Intervention(s): Limited activity within patient's tolerance, Monitored during session, Repositioned, Patient requesting pain meds-RN notified, RN gave pain meds during session, Other (comment) (declined ice)    Home Living                           Prior Function            PT Goals (current goals can now be found in the care plan section) Acute Rehab PT Goals Patient Stated Goal: return home PT Goal Formulation: With patient Time For Goal Achievement: 03/18/24 Potential to Achieve Goals: Good Progress towards PT goals: Progressing toward goals    Frequency    7X/week      PT Plan      Co-evaluation              AM-PAC PT 6 Clicks Mobility   Outcome Measure  Help needed turning from your back to your side while in a flat bed without using bedrails?: A Lot Help needed moving from lying on your back to sitting on the side of a flat bed without using bedrails?: A Lot Help needed moving to and from a bed to a chair (including a wheelchair)?: A Lot Help needed standing up from a chair using your arms (e.g., wheelchair or bedside chair)?: A Lot Help needed to walk in hospital room?: A Lot Help needed climbing 3-5 steps with a railing? : A Lot 6 Click Score: 12    End of Session Equipment Utilized During Treatment: Gait belt Activity Tolerance: Patient tolerated treatment well Patient left: in chair;with call bell/phone within reach;with chair alarm set Nurse Communication: Mobility status PT Visit Diagnosis: Other abnormalities of gait and mobility (R26.89);Muscle weakness (generalized) (M62.81)     Time: 8573-8557 PT Time Calculation (min) (ACUTE ONLY): 16 min  Charges:    $Gait Training: 8-22 mins PT General Charges $$ ACUTE PT VISIT: 1 Visit                     Francis Doenges, PT  Acute Rehab Dept ([facility]) [phone]  03/06/2024    [facility] 03/06/2024, 2:54 PM

## 2024-03-06 NOTE — Plan of Care (Signed)
  Problem: Activity: Goal: Risk for activity intolerance will decrease 03/06/2024 0745 by Leontine Ricky BROCKS, RN Outcome: Progressing 03/06/2024 0745 by Leontine Ricky BROCKS, RN Outcome: Progressing   Problem: Education: Goal: Knowledge of General Education information will improve Description: Including pain rating scale, medication(s)/side effects and non-pharmacologic comfort measures 03/06/2024 0745 by Kayshaun Polanco C, RN Outcome: Progressing 03/06/2024 0745 by Leontine Ricky BROCKS, RN Outcome: Progressing

## 2024-03-06 NOTE — Plan of Care (Signed)
  Problem: Clinical Measurements: Goal: Will remain free from infection Outcome: Progressing Goal: Respiratory complications will improve Outcome: Progressing Goal: Cardiovascular complication will be avoided Outcome: Progressing   Problem: Coping: Goal: Level of anxiety will decrease Outcome: Progressing   Problem: Elimination: Goal: Will not experience complications related to bowel motility Outcome: Progressing Goal: Will not experience complications related to urinary retention Outcome: Progressing   Problem: Pain Managment: Goal: General experience of comfort will improve and/or be controlled Outcome: Progressing   Problem: Safety: Goal: Ability to remain free from injury will improve Outcome: Progressing   Problem: Skin Integrity: Goal: Risk for impaired skin integrity will decrease Outcome: Progressing   Problem: Clinical Measurements: Goal: Postoperative complications will be avoided or minimized Outcome: Progressing

## 2024-03-07 ENCOUNTER — Other Ambulatory Visit (HOSPITAL_COMMUNITY): Payer: Self-pay

## 2024-03-07 ENCOUNTER — Other Ambulatory Visit: Payer: Self-pay | Admitting: Physician Assistant

## 2024-03-07 ENCOUNTER — Encounter (HOSPITAL_COMMUNITY): Payer: Self-pay | Admitting: Orthopaedic Surgery

## 2024-03-07 DIAGNOSIS — M1712 Unilateral primary osteoarthritis, left knee: Secondary | ICD-10-CM | POA: Diagnosis not present

## 2024-03-07 MED ORDER — APIXABAN 2.5 MG PO TABS: 2.5000 mg | ORAL_TABLET | Freq: Two times a day (BID) | ORAL | 0 refills | Status: AC

## 2024-03-07 MED ORDER — APIXABAN 2.5 MG PO TABS: 2.5000 mg | ORAL_TABLET | Freq: Two times a day (BID) | ORAL | 0 refills | Status: DC

## 2024-03-07 MED ORDER — OXYCODONE-ACETAMINOPHEN 5-325 MG PO TABS: 1.0000 | ORAL_TABLET | Freq: Four times a day (QID) | ORAL | 0 refills | Status: DC | PRN

## 2024-03-07 NOTE — [place] FL2 (Signed)
 [place]  MEDICAID FL2 LEVEL OF CARE FORM     IDENTIFICATION  Patient Name: Thomas Mcneil Birthdate: 01-25-1951 Sex: male Admission Date (Current Location): 03/04/2024  [facility] and [place] Number:  Producer, television/film/video and Address:  [facility],  [address], [place] [address]      Provider Number: [id number]  Attending Physician Name and Address:  Jerri Kay HERO, MD  Relative Name and Phone Number:  sellar,loda Pricilla)  [phone] [facility])    Current Level of Care: Hospital Recommended Level of Care: Skilled Nursing Facility Prior Approval Number:    Date Approved/Denied:   PASRR Number: pending  Discharge Plan: SNF    Current Diagnoses: Patient Active Problem List   Diagnosis Date Noted   Status post total left knee replacement 03/04/2024   Primary osteoarthritis of left knee 02/12/2024   Status post total replacement of right hip 10/19/2023   Pressure injury of skin of left buttock 10/13/2023   Primary osteoarthritis of right hip 09/08/2023   Cocaine dependence, binge pattern (HCC) 11/26/2015   Substance induced mood disorder (HCC) 11/26/2015   Intention tremor 11/26/2015   Alcohol  use disorder, severe, dependence (HCC) 09/04/2015   Severe recurrent major depression without psychotic features (HCC) 09/04/2015   Alcohol  dependence, uncomplicated (HCC) 09/01/2015   Alcohol -induced mood disorder (HCC) 09/01/2015   Cocaine abuse (HCC) 09/01/2015   Alcohol  abuse 04/05/2014   Essential tremor 01/04/2014   Other and unspecified hyperlipidemia 01/04/2014   B12 deficiency 01/04/2014   Melena 10/23/2012   GERD 05/27/2010   SHOULDER PAIN, LEFT 05/21/2010   ONYCHOMYCOSIS, TOENAILS 05/09/2010   KNEE PAIN, BILATERAL 06/27/2009   LEG CRAMPS 06/27/2009   Contusion 08/18/2008   LEG EDEMA, BILATERAL 01/13/2008   ERECTILE DYSFUNCTION 10/15/2007   TOBACCO ABUSE 08/16/2007   Mononeuritis of lower limb 08/16/2007   CORNS AND CALLUSES 08/16/2007   ALCOHOL   ABUSE, HX OF 08/16/2007    Orientation RESPIRATION BLADDER Height & Weight     Time, Situation, Place, Self  Normal Continent Weight: 90 kg Height:  6' (182.9 cm)  BEHAVIORAL SYMPTOMS/MOOD NEUROLOGICAL BOWEL NUTRITION STATUS      Continent Diet (carb modified)  AMBULATORY STATUS COMMUNICATION OF NEEDS Skin   Limited Assist Verbally Surgical wounds (Left Total Knee Arthroplasty)                       Personal Care Assistance Level of Assistance  Bathing, Feeding, Dressing Bathing Assistance: Limited assistance Feeding assistance: Limited assistance Dressing Assistance: Limited assistance     Functional Limitations Info  Sight, Speech, Hearing Sight Info: Impaired (eyeglasses) Hearing Info: Adequate Speech Info: Adequate    SPECIAL CARE FACTORS FREQUENCY  PT (By licensed PT), OT (By licensed OT)     PT Frequency: 5x/wk OT Frequency: 5x/wk            Contractures Contractures Info: Not present    Additional Factors Info  Code Status, Allergies, Psychotropic Code Status Info: Full Code Allergies Info: NKA Psychotropic Info: Cymbalta  60mg  po BID         Current Medications (03/07/2024):  This is the current hospital active medication list Current Facility-Administered Medications  Medication Dose Route Frequency Provider Last Rate Last Admin   0.9 %  sodium chloride  infusion   Intravenous Continuous Jerri Kay HERO, MD   Stopped at 03/05/24 1146   acetaminophen  (TYLENOL ) tablet 325-650 mg  325-650 mg Oral Q6H PRN Jerri Kay HERO, MD       amLODipine  (  NORVASC ) tablet 5 mg  5 mg Oral Daily Jerri Kay HERO, MD   5 mg at 03/07/24 9047   apixaban  (ELIQUIS ) tablet 2.5 mg  2.5 mg Oral Q12H Jerri Kay HERO, MD   2.5 mg at 03/07/24 9048   docusate sodium  (COLACE) capsule 100 mg  100 mg Oral BID Jerri Kay HERO, MD   100 mg at 03/07/24 9048   doxycycline  (VIBRA -TABS) tablet 100 mg  100 mg Oral BID Jerri Kay HERO, MD   100 mg at 03/07/24 9048   DULoxetine  (CYMBALTA ) DR capsule 60  mg  60 mg Oral BID Jerri Kay HERO, MD   60 mg at 03/07/24 9047   HYDROmorphone  (DILAUDID ) injection 1 mg  1 mg Intravenous Q8H PRN Jerri Kay HERO, MD   1 mg at 03/07/24 9045   menthol  (CEPACOL) lozenge 3 mg  1 lozenge Oral PRN Jerri Kay HERO, MD       Or   phenol (CHLORASEPTIC) mouth spray 1 spray  1 spray Mouth/Throat PRN Jerri Kay HERO, MD       methocarbamol  (ROBAXIN ) tablet 500 mg  500 mg Oral Q6H PRN Jerri Kay HERO, MD   500 mg at 03/07/24 9048   Or   methocarbamol  (ROBAXIN ) injection 500 mg  500 mg Intravenous Q6H PRN Jerri Kay HERO, MD       metoCLOPramide  (REGLAN ) tablet 5-10 mg  5-10 mg Oral Q8H PRN Jerri Kay HERO, MD       Or   metoCLOPramide  (REGLAN ) injection 5-10 mg  5-10 mg Intravenous Q8H PRN Jerri Kay HERO, MD       ondansetron  (ZOFRAN ) tablet 4 mg  4 mg Oral Q6H PRN Jerri Kay HERO, MD       Or   ondansetron  (ZOFRAN ) injection 4 mg  4 mg Intravenous Q6H PRN Jerri Kay HERO, MD       oxyCODONE  (Oxy IR/ROXICODONE ) immediate release tablet 10 mg  10 mg Oral Q6H PRN Jerri Kay HERO, MD   10 mg at 03/07/24 9370   oxyCODONE  (Oxy IR/ROXICODONE ) immediate release tablet 5 mg  5 mg Oral Q6H PRN Jerri Kay HERO, MD   5 mg at 03/04/24 1423   pregabalin  (LYRICA ) capsule 225 mg  225 mg Oral BID Jerri Kay HERO, MD   225 mg at 03/07/24 9047   propranolol  (INDERAL ) tablet 80 mg  80 mg Oral BID Jerri Kay HERO, MD   80 mg at 03/07/24 9046     Discharge Medications: Please see discharge summary for a list of discharge medications.  Relevant Imaging Results:  Relevant Lab Results:   Additional Information SSN: [id number]  Alfonse JONELLE Rex, RN

## 2024-03-07 NOTE — TOC Transition Note (Signed)
 Transition of Care [facility]) - Discharge Note   Patient Details  Name: Thomas Mcneil MRN: [id number] Date of Birth: [birth year]  [facility]) CM/SW Contact:  Alfonse JONELLE Rex, RN Phone Number: 03/07/2024, 1:30 PM   Clinical Narrative:   Met with patient at bedside to introduce or of IP Care Management and review for dc planning, TOC Consult for SNF Placement/PT recommendation for SNF, patient agreeable, no preference. Per PT note has FWW and BSC at home, no current home care services. FL2 updated, Level 2 PASRR pending, faxed out for bed offers.       Barriers to Discharge: Continued Medical Work up   Patient Goals and CMS Choice Patient states their goals for this hospitalization and ongoing recovery are:: return home          Discharge Placement                       Discharge Plan and Services Additional resources added to the After Visit Summary for                                       Social Drivers of Health (SDOH) Interventions SDOH Screenings   Food Insecurity: No Food Insecurity (03/04/2024)  Housing: Low Risk  (03/04/2024)  Transportation Needs: No Transportation Needs (03/04/2024)  Utilities: At Risk (03/04/2024)  Depression (PHQ2-9): Low Risk  (01/01/2023)  Social Connections: Socially Isolated (03/04/2024)  Tobacco Use: High Risk (03/04/2024)     Readmission Risk Interventions     No data to display

## 2024-03-07 NOTE — TOC PASRR Note (Signed)
 30 Day PASRR Note   Patient Details  Name: Thomas Mcneil Date of Birth: [birth year]   Transition of Care [facility]) CM/SW Contact:    Alfonse JONELLE Rex, RN Phone Number: 03/07/2024, 1:20 PM  To Whom It May Concern:  Please be advised that this patient will require a short-term nursing home stay - anticipated 30 days or less for rehabilitation and strengthening.   The plan is for return home.

## 2024-03-07 NOTE — Progress Notes (Signed)
 Subjective: 3 Days Post-Op Procedure(s) (LRB): ARTHROPLASTY, KNEE, TOTAL (Left) Patient reports pain as moderate.    Objective: Vital signs in last 24 hours: Temp:  [97.4 F (36.3 C)-98.4 F (36.9 C)] 98.4 F (36.9 C) (09/29 1300) Pulse Rate:  [59-77] 59 (09/29 1300) Resp:  [13-20] 13 (09/29 1300) BP: (102-144)/(64-75) 102/64 (09/29 1300) SpO2:  [95 %-98 %] 98 % (09/29 1300)  Intake/Output from previous day: 09/28 0701 - 09/29 0700 In: 530 [P.O.:530] Out: 1900 [Urine:1900] Intake/Output this shift: Total I/O In: 240 [P.O.:240] Out: 400 [Urine:400]  No results for input(s): HGB in the last 72 hours. No results for input(s): WBC, RBC, HCT, PLT in the last 72 hours. No results for input(s): NA, K, CL, CO2, BUN, CREATININE, GLUCOSE, CALCIUM  in the last 72 hours. No results for input(s): LABPT, INR in the last 72 hours.  Neurologically intact Neurovascular intact Sensation intact distally Intact pulses distally Dorsiflexion/Plantar flexion intact Incision: dressing C/D/I No cellulitis present Compartment soft   Assessment/Plan: 3 Days Post-Op Procedure(s) (LRB): ARTHROPLASTY, KNEE, TOTAL (Left) Advance diet Up with therapy D/C IV fluids Discharge to SNF Once insurance approves and bed is available WBAT LLE F/u with ortho two weeks po      Ronal LITTIE Grave 03/07/2024, 4:03 PM

## 2024-03-07 NOTE — Progress Notes (Addendum)
 Physical Therapy Treatment Patient Details Name: Thomas Mcneil MRN: [id number] DOB: May 22, 1951 Today's Date: 03/07/2024   History of Present Illness Pt is 73 yo L TKA on 03/04/24.  Pt with hx including but not limited to R THA 10/19/23, arthritis, COPD, DVT, HTN, MGUS, neuropathy, tremor    PT Comments  Pt progressing however continues to require min assist for amb d/t tremors and decr coordination, cues for RW use and safety. Pt was able to amb 30' this session. Pt may need additional post acute rehab to progress to supervision level  for safe d/c home.  Patient will benefit from continued inpatient follow up therapy, <3 hours/day Will continue to follow acutely   If plan is discharge home, recommend the following: A lot of help with walking and/or transfers;A lot of help with bathing/dressing/bathroom;Help with stairs or ramp for entrance   Can travel by private vehicle        Equipment Recommendations  None recommended by PT    Recommendations for Other Services       Precautions / Restrictions Precautions Precautions: Fall;Knee Restrictions LLE Weight Bearing Per Provider Order: Weight bearing as tolerated     Mobility  Bed Mobility Overal bed mobility: Needs Assistance Bed Mobility: Supine to Sit     Supine to sit: Min assist     General bed mobility comments: assist to progress LLE on and off bed    Transfers Overall transfer level: Needs assistance Equipment used: Rolling walker (2 wheels) Transfers: Sit to/from Stand Sit to Stand: Min assist, From elevated surface, +2 safety/equipment           General transfer comment: ongoing education for technique, hand placement, LLE position prior to descent. assist to rise and steady.    Ambulation/Gait Ambulation/Gait assistance: Min assist, +2 physical assistance, +2 safety/equipment Gait Distance (Feet): 30 Feet Assistive device: Rolling walker (2 wheels) Gait Pattern/deviations: Step-to pattern, Narrow base  of support Gait velocity: decr     General Gait Details: pt requiring assist to balance, wt shift and UE approximation to control tremors and RW position. cues for RW position and safety, especially with turns. distance ltd by fatigue and pain   Stairs             Wheelchair Mobility     Tilt Bed    Modified Rankin (Stroke Patients Only)       Balance   Sitting-balance support: No upper extremity supported, Feet supported Sitting balance-Leahy Scale: Fair     Standing balance support: Bilateral upper extremity supported Standing balance-Leahy Scale: Poor Standing balance comment: reliant on B UE and CGA to min  to assist in static standing                            Communication Communication Communication: No apparent difficulties  Cognition Arousal: Alert Behavior During Therapy: WFL for tasks assessed/performed   PT - Cognitive impairments: No apparent impairments                         Following commands: Intact      Cueing Cueing Techniques: Verbal cues  Exercises Total Joint Exercises Ankle Circles/Pumps: AROM, Both, 10 reps Quad Sets: AROM, Both, 10 reps Heel Slides: AAROM, Left, 10 reps Goniometric ROM: grossly 8 to 70 degrees knee flexion    General Comments        Pertinent Vitals/Pain Pain Assessment Pain Assessment: Faces Faces Pain  Scale: Hurts even more Pain Location: L knee with activity Pain Descriptors / Indicators: Grimacing, Guarding, Sore Pain Intervention(s): Limited activity within patient's tolerance, Monitored during session, Premedicated before session, Repositioned, Ice applied    Home Living                          Prior Function            PT Goals (current goals can now be found in the care plan section) Acute Rehab PT Goals Patient Stated Goal: return home PT Goal Formulation: With patient Time For Goal Achievement: 03/18/24 Potential to Achieve Goals: Good Progress towards  PT goals: Progressing toward goals    Frequency    7X/week      PT Plan      Co-evaluation              AM-PAC PT 6 Clicks Mobility   Outcome Measure  Help needed turning from your back to your side while in a flat bed without using bedrails?: A Little Help needed moving from lying on your back to sitting on the side of a flat bed without using bedrails?: A Little Help needed moving to and from a bed to a chair (including a wheelchair)?: A Little Help needed standing up from a chair using your arms (e.g., wheelchair or bedside chair)?: A Little Help needed to walk in hospital room?: A Lot Help needed climbing 3-5 steps with a railing? : A Lot 6 Click Score: 16    End of Session Equipment Utilized During Treatment: Gait belt Activity Tolerance: Patient tolerated treatment well Patient left: in chair;with call bell/phone within reach;with chair alarm set Nurse Communication: Mobility status PT Visit Diagnosis: Other abnormalities of gait and mobility (R26.89);Muscle weakness (generalized) (M62.81)     Time: 1000-1022 PT Time Calculation (min) (ACUTE ONLY): 22 min  Charges:    $Gait Training: 8-22 mins PT General Charges $$ ACUTE PT VISIT: 1 Visit                     Sherrica Niehaus, PT  Acute Rehab Dept [facility]) [phone]  03/07/2024    [facility] 03/07/2024, 11:40 AM

## 2024-03-07 NOTE — Plan of Care (Signed)
  Problem: Health Behavior/Discharge Planning: Goal: Ability to manage health-related needs will improve Outcome: Progressing   Problem: Clinical Measurements: Goal: Will remain free from infection Outcome: Progressing Goal: Respiratory complications will improve Outcome: Progressing Goal: Cardiovascular complication will be avoided Outcome: Progressing   Problem: Coping: Goal: Level of anxiety will decrease Outcome: Progressing   Problem: Elimination: Goal: Will not experience complications related to urinary retention Outcome: Progressing   Problem: Pain Managment: Goal: General experience of comfort will improve and/or be controlled Outcome: Progressing   Problem: Safety: Goal: Ability to remain free from injury will improve Outcome: Progressing   Problem: Skin Integrity: Goal: Risk for impaired skin integrity will decrease Outcome: Progressing   Problem: Activity: Goal: Ability to avoid complications of mobility impairment will improve Outcome: Progressing Goal: Range of joint motion will improve Outcome: Progressing   Problem: Clinical Measurements: Goal: Postoperative complications will be avoided or minimized Outcome: Progressing

## 2024-03-07 NOTE — Discharge Summary (Addendum)
 Patient ID: Thomas Mcneil MRN: [id number] DOB/AGE: December 07, 1950 73 y.o.  Admit date: 03/04/2024 Discharge date: 03/09/2024  Admission Diagnoses:  Principal Problem:   Primary osteoarthritis of left knee Active Problems:   Status post total left knee replacement   Discharge Diagnoses:  Same  Past Medical History:  Diagnosis Date   Anemia    Arthritis    Complication of anesthesia    slow to wake up after last surgery   COPD (chronic obstructive pulmonary disease) (HCC)    Depression    DVT (deep venous thrombosis) (HCC)    GERD (gastroesophageal reflux disease)    Hepatitis    hep c   High blood pressure    High cholesterol    MGUS (monoclonal gammopathy of unknown significance)    Neuropathy    Peripheral neuropathy    feet and hands    Surgeries: Procedure(s): ARTHROPLASTY, KNEE, TOTAL on 03/04/2024   Consultants:   Discharged Condition: Improved  Hospital Course: KRISHAV MAMONE is an 73 y.o. male who was admitted 03/04/2024 for operative treatment ofPrimary osteoarthritis of left knee. Patient has severe unremitting pain that affects sleep, daily activities, and work/hobbies. After pre-op clearance the patient was taken to the operating room on 03/04/2024 and underwent  Procedure(s): ARTHROPLASTY, KNEE, TOTAL.    Patient was given perioperative antibiotics:  Anti-infectives (From admission, onward)    Start     Dose/Rate Route Frequency Ordered Stop   03/04/24 2200  doxycycline  (VIBRA -TABS) tablet 100 mg        100 mg Oral 2 times daily 03/04/24 1358     03/04/24 1600  ceFAZolin  (ANCEF ) IVPB 2g/100 mL premix        2 g 200 mL/hr over 30 Minutes Intravenous Every 6 hours 03/04/24 1358 03/04/24 2230   03/04/24 1040  vancomycin  (VANCOCIN ) powder  Status:  Discontinued          As needed 03/04/24 1040 03/04/24 1145   03/04/24 0745  ceFAZolin  (ANCEF ) IVPB 2g/100 mL premix        2 g 200 mL/hr over 30 Minutes Intravenous On call to O.R. 03/04/24 9266 03/04/24 9041         Patient was given sequential compression devices, early ambulation, and chemoprophylaxis to prevent DVT.  Inpatient Morphine  Milligram Equivalents Per Day 9/26 - 10/1   Values displayed are in units of MME/Day    Order Start / End Date 9/26 9/27 9/28 9/29 Yesterday Today    oxyCODONE  (Oxy IR/ROXICODONE ) immediate release tablet 5 mg 9/26 - No end date 7.5 of 15 0 of 30 0 of 30 0 of 30 0 of 30 0 of 30    oxyCODONE  (Oxy IR/ROXICODONE ) immediate release tablet 10 mg 9/26 - No end date 15 of 30 30 of 60 45 of 60 30 of 60 15 of 60 0 of 60    HYDROmorphone  (DILAUDID ) injection 1 mg 9/26 - No end date 20 of 40 20 of 60 0 of 60 20 of 60 0 of 60 0 of 60    fentaNYL  (SUBLIMAZE ) injection 50-100 mcg 9/26 - 9/26 30 of 15-30 -- -- -- -- --    fentaNYL  (SUBLIMAZE ) injection 25-50 mcg 9/26 - 9/26 0 of 45-90 -- -- -- -- --    Daily Totals  72.5 of 145-205 50 of 150 45 of 150 50 of 150 15 of 150 0 of 150       Patient benefited maximally from hospital stay and there were no complications.  Recent vital signs: Patient Vitals for the past 24 hrs:  BP Temp Temp src Pulse Resp SpO2  03/09/24 0518 135/82 98.5 F (36.9 C) Oral 64 19 96 %  03/08/24 2149 136/81 -- -- 74 -- --  03/08/24 2057 136/81 99.5 F (37.5 C) -- 74 19 95 %  03/08/24 1231 111/60 97.8 F (36.6 C) -- 63 18 95 %     Recent laboratory studies: No results for input(s): WBC, HGB, HCT, PLT, NA, K, CL, CO2, BUN, CREATININE, GLUCOSE, INR, CALCIUM  in the last 72 hours.  Invalid input(s): PT, 2   Discharge Medications:   Allergies as of 03/09/2024   No Known Allergies      Medication List     STOP taking these medications    doxycycline  100 MG tablet Commonly known as: VIBRA -TABS       TAKE these medications    amLODipine  5 MG tablet Commonly known as: NORVASC  Take 1 tablet by mouth daily.   apixaban  2.5 MG Tabs tablet Commonly known as: Eliquis  Take 1 tablet by mouth twice daily for 30  days after surgery to prevent blood clots   Complete Prostate Health Tabs Take 1 tablet by mouth in the morning and at bedtime.   cyanocobalamin 500 MCG tablet Commonly known as: VITAMIN B12 Take 1,000 mcg by mouth every morning.   docusate sodium  100 MG capsule Commonly known as: Colace Take 1 capsule (100 mg total) by mouth daily as needed.   DULoxetine  60 MG capsule Commonly known as: CYMBALTA  Take 60 mg by mouth 2 (two) times daily.   ferrous sulfate  325 (65 FE) MG EC tablet Take 325 mg by mouth daily with breakfast.   fluticasone  50 MCG/ACT nasal spray Commonly known as: FLONASE  Place 2 sprays into both nostrils daily as needed for allergies. For allergies   ketoconazole 2 % cream Commonly known as: NIZORAL Apply 1 Application topically 2 (two) times daily as needed for irritation.   lovastatin  20 MG tablet Commonly known as: MEVACOR  Take 1 tablet (20 mg total) by mouth at bedtime. For High Cholesterol   Magnesium  Citrate 200 MG Tabs Take 200 mg by mouth daily at 12 noon.   methocarbamol  750 MG tablet Commonly known as: Robaxin -750 Take 1 tablet (750 mg total) by mouth 3 (three) times daily as needed for muscle spasms.   multivitamin with minerals Tabs tablet Take 1 tablet by mouth daily.   ondansetron  4 MG tablet Commonly known as: Zofran  Take 1 tablet (4 mg total) by mouth every 8 (eight) hours as needed for nausea or vomiting.   oxyCODONE -acetaminophen  5-325 MG tablet Commonly known as: Percocet Take 1-2 tablets by mouth every 6 (six) hours as needed. To be taken after surgery   pantoprazole  40 MG tablet Commonly known as: PROTONIX  Take 40 mg by mouth 2 (two) times daily.   pregabalin  225 MG capsule Commonly known as: LYRICA  Take 225 mg by mouth 2 (two) times daily.   propranolol  80 MG tablet Commonly known as: INDERAL  Take 80 mg by mouth 2 (two) times daily.   traZODone  100 MG tablet Commonly known as: DESYREL  Take 2 tablets (200 mg total) by  mouth at bedtime. For sleep What changed: how much to take   vitamin C  1000 MG tablet Take 1 tablet (1,000 mg total) by mouth daily. For Vitamin C  supplementation               Durable Medical Equipment  (From admission, onward)  Start     Ordered   03/04/24 1359  DME Walker rolling  Once       Question Answer Comment  Walker: With 5 Inch Wheels   Patient needs a walker to treat with the following condition Status post left partial knee replacement      03/04/24 1358   03/04/24 1359  DME 3 n 1  Once        03/04/24 1358   03/04/24 1359  DME Bedside commode  Once       Question:  Patient needs a bedside commode to treat with the following condition  Answer:  Status post left partial knee replacement   03/04/24 1358            Diagnostic Studies: DG Knee Left Port Result Date: 03/04/2024 CLINICAL DATA:  Left knee arthroplasty EXAM: PORTABLE LEFT KNEE - 1-2 VIEW COMPARISON:  February 12, 2024 FINDINGS: Status post left knee arthroplasty with normal alignment and proper position of the surgical hardware. Postsurgical changes, soft tissue swelling and air-fluid level within the joint space. IMPRESSION: Postsurgical changes of left knee arthroplasty. Electronically Signed   By: Megan  Zare M.D.   On: 03/04/2024 16:18   XR KNEE 3 VIEW LEFT Result Date: 02/12/2024 X-rays of the left knee show advanced tricompartmental osteoarthritis.  Bone-on-bone joint space narrowing.  Kellgren-Lawrence stage IV   Disposition: Discharge disposition: 03-Skilled Nursing Facility          Contact information for follow-up providers     Jule Ronal CROME, PA-C. Schedule an appointment as soon as possible for a visit in 2 week(s).   Specialty: Orthopedic Surgery Contact information: [address] [place]  [place] [address] [phone]              Contact information for after-discharge care     Destination     [facility] .   Service: Skilled Nursing Contact  information: [address] [place]  [phone]                      Signed: Ronal CROME Jule 03/09/2024, 7:31 AM

## 2024-03-08 DIAGNOSIS — M1712 Unilateral primary osteoarthritis, left knee: Secondary | ICD-10-CM | POA: Diagnosis not present

## 2024-03-08 NOTE — Progress Notes (Signed)
 Physical Therapy Treatment Patient Details Name: Thomas Mcneil MRN: [id number] DOB: 1950/06/24 Today's Date: 03/08/2024   History of Present Illness Pt is 73 yo L TKA on 03/04/24.  Pt with hx including but not limited to R THA 10/19/23, arthritis, COPD, DVT, HTN, MGUS, neuropathy, tremor    PT Comments  Pt making slow but steady progress; activity tolerance and knee ROM steadily improving. D/c plan remains appropriate at this time. Continue PT POC     If plan is discharge home, recommend the following: A lot of help with walking and/or transfers;A lot of help with bathing/dressing/bathroom;Help with stairs or ramp for entrance   Can travel by private vehicle     No  Equipment Recommendations  None recommended by PT    Recommendations for Other Services       Precautions / Restrictions Precautions Precautions: Fall;Knee Recall of Precautions/Restrictions: Intact Restrictions Weight Bearing Restrictions Per Provider Order: No LLE Weight Bearing Per Provider Order: Weight bearing as tolerated     Mobility  Bed Mobility   Bed Mobility: Supine to Sit           General bed mobility comments: light assist to progress LLE on and off bed    Transfers Overall transfer level: Needs assistance Equipment used: Rolling walker (2 wheels) Transfers: Sit to/from Stand Sit to Stand: Min assist, From elevated surface, +2 safety/equipment           General transfer comment: ongoing education for technique, hand placement, LLE position prior to descent. assist to rise and steady. much improved control of descent to chair    Ambulation/Gait Ambulation/Gait assistance: Min assist, +2 physical assistance, +2 safety/equipment Gait Distance (Feet): 55 Feet Assistive device: Rolling walker (2 wheels) Gait Pattern/deviations: Step-to pattern, Narrow base of support Gait velocity: decr     General Gait Details: pt requiring assist to balance, wt shift and UE approximation to control  tremors and RW position. cues for RW position and safety. pt demonstrates carryover with proper foot position and RW placement with turns   Stairs             Wheelchair Mobility     Tilt Bed    Modified Rankin (Stroke Patients Only)       Balance   Sitting-balance support: No upper extremity supported, Feet supported Sitting balance-Leahy Scale: Fair     Standing balance support: Bilateral upper extremity supported, During functional activity, Reliant on assistive device for balance Standing balance-Leahy Scale: Poor Standing balance comment: reliant on B UE and CGA to min  to assist in static standing                            Communication Communication Communication: No apparent difficulties  Cognition Arousal: Alert Behavior During Therapy: WFL for tasks assessed/performed   PT - Cognitive impairments: No apparent impairments                         Following commands: Intact      Cueing Cueing Techniques: Verbal cues  Exercises Total Joint Exercises Ankle Circles/Pumps: AROM, Both, 10 reps Quad Sets: AROM, Both, 10 reps Straight Leg Raises: AROM, Strengthening, Left, 10 reps Goniometric ROM: ~ 6 to 70 degrees flexion    General Comments        Pertinent Vitals/Pain Pain Assessment Pain Assessment: Faces Faces Pain Scale: Hurts little more Pain Location: L knee with activity Pain Descriptors /  Indicators: Grimacing, Guarding, Sore Pain Intervention(s): Limited activity within patient's tolerance, Monitored during session, Premedicated before session, Repositioned, Ice applied    Home Living                          Prior Function            PT Goals (current goals can now be found in the care plan section) Acute Rehab PT Goals Patient Stated Goal: return home PT Goal Formulation: With patient Time For Goal Achievement: 03/18/24 Potential to Achieve Goals: Good Progress towards PT goals: Progressing  toward goals    Frequency    7X/week      PT Plan      Co-evaluation              AM-PAC PT 6 Clicks Mobility   Outcome Measure  Help needed turning from your back to your side while in a flat bed without using bedrails?: A Little Help needed moving from lying on your back to sitting on the side of a flat bed without using bedrails?: A Little Help needed moving to and from a bed to a chair (including a wheelchair)?: A Little Help needed standing up from a chair using your arms (e.g., wheelchair or bedside chair)?: A Little Help needed to walk in hospital room?: A Little Help needed climbing 3-5 steps with a railing? : A Lot 6 Click Score: 17    End of Session Equipment Utilized During Treatment: Gait belt Activity Tolerance: Patient tolerated treatment well Patient left: with call bell/phone within reach;in chair;with chair alarm set;with nursing/sitter in room Nurse Communication: Mobility status PT Visit Diagnosis: Other abnormalities of gait and mobility (R26.89);Muscle weakness (generalized) (M62.81)     Time: 8778-8749 PT Time Calculation (min) (ACUTE ONLY): 29 min  Charges:    $Gait Training: 23-37 mins PT General Charges $$ ACUTE PT VISIT: 1 Visit                     Donella Pascarella, PT  Acute Rehab Dept ([facility]) [phone]  03/08/2024    [facility] 03/08/2024, 1:21 PM

## 2024-03-08 NOTE — Plan of Care (Signed)
  Problem: Clinical Measurements: Goal: Will remain free from infection Outcome: Progressing Goal: Respiratory complications will improve Outcome: Progressing Goal: Cardiovascular complication will be avoided Outcome: Progressing   Problem: Activity: Goal: Risk for activity intolerance will decrease Outcome: Progressing   Problem: Nutrition: Goal: Adequate nutrition will be maintained Outcome: Progressing   Problem: Coping: Goal: Level of anxiety will decrease Outcome: Progressing   Problem: Elimination: Goal: Will not experience complications related to urinary retention Outcome: Progressing   Problem: Pain Managment: Goal: General experience of comfort will improve and/or be controlled Outcome: Progressing   Problem: Safety: Goal: Ability to remain free from injury will improve Outcome: Progressing   Problem: Skin Integrity: Goal: Risk for impaired skin integrity will decrease Outcome: Progressing   Problem: Pain Management: Goal: Pain level will decrease with appropriate interventions Outcome: Progressing

## 2024-03-08 NOTE — Plan of Care (Signed)

## 2024-03-08 NOTE — TOC Progression Note (Addendum)
 Transition of Care [facility]) - Progression Note    Patient Details  Name: Thomas Mcneil MRN: [id number] Date of Birth: [birth year]  Transition [facility]) CM/SW Contact  Thomas JONELLE Rex, RN Phone Number: 03/08/2024, 11:16 AM  Clinical Narrative:  Call to patient's sister,  Thomas Mcneil, per her request. Introduced role of TOC/NCM and reviewed dc planning, PT recommendation for short term rehab/SNF. Reviewed with pt's sister that patient agreeable and that he did not request NCM to speak with any family members. Patient's sister states that she is patient's HCPOA. NCM informed patient's sister that there are no HCPOA papers on file. SNF bed offers reviewed with patient's sister [facility] ,  [facility]), accepted bed offer at [facility], admit coord, notified.   -11:52am. SNF auth initiate via [employer], REF# [id number], auth pending.       Expected Discharge Plan: Skilled Nursing Facility Barriers to Discharge: Continued Medical Work up               Expected Discharge Plan and Services       Living arrangements for the past 2 months: Single Family Home Expected Discharge Date: 03/08/24                                     Social Drivers of Health (SDOH) Interventions SDOH Screenings   Food Insecurity: No Food Insecurity (03/04/2024)  Housing: Low Risk  (03/04/2024)  Transportation Needs: No Transportation Needs (03/04/2024)  Utilities: At Risk (03/04/2024)  Depression (PHQ2-9): Low Risk  (01/01/2023)  Social Connections: Socially Isolated (03/04/2024)  Tobacco Use: High Risk (03/04/2024)    Readmission Risk Interventions     No data to display

## 2024-03-08 NOTE — Plan of Care (Signed)
  Problem: Education: Goal: Knowledge of General Education information will improve Description: Including pain rating scale, medication(s)/side effects and non-pharmacologic comfort measures 03/08/2024 0830 by Alaina Dozier PARAS, RN Outcome: Adequate for Discharge 03/08/2024 0759 by Alaina Dozier PARAS, RN Outcome: Progressing   Problem: Health Behavior/Discharge Planning: Goal: Ability to manage health-related needs will improve 03/08/2024 0830 by Alaina Dozier PARAS, RN Outcome: Adequate for Discharge 03/08/2024 0759 by Alaina Dozier PARAS, RN Outcome: Progressing   Problem: Clinical Measurements: Goal: Ability to maintain clinical measurements within normal limits will improve 03/08/2024 0830 by Alaina Dozier PARAS, RN Outcome: Adequate for Discharge 03/08/2024 0759 by Alaina Dozier PARAS, RN Outcome: Progressing Goal: Will remain free from infection 03/08/2024 0830 by Alaina Dozier PARAS, RN Outcome: Adequate for Discharge 03/08/2024 0759 by Alaina Dozier PARAS, RN Outcome: Progressing Goal: Diagnostic test results will improve Outcome: Adequate for Discharge Goal: Respiratory complications will improve Outcome: Adequate for Discharge Goal: Cardiovascular complication will be avoided Outcome: Adequate for Discharge   Problem: Activity: Goal: Risk for activity intolerance will decrease Outcome: Adequate for Discharge   Problem: Nutrition: Goal: Adequate nutrition will be maintained Outcome: Adequate for Discharge   Problem: Coping: Goal: Level of anxiety will decrease Outcome: Adequate for Discharge   Problem: Elimination: Goal: Will not experience complications related to bowel motility Outcome: Adequate for Discharge Goal: Will not experience complications related to urinary retention Outcome: Adequate for Discharge   Problem: Pain Managment: Goal: General experience of comfort will improve and/or be controlled Outcome: Adequate for Discharge    Problem: Safety: Goal: Ability to remain free from injury will improve Outcome: Adequate for Discharge   Problem: Skin Integrity: Goal: Risk for impaired skin integrity will decrease Outcome: Adequate for Discharge   Problem: Education: Goal: Knowledge of the prescribed therapeutic regimen will improve Outcome: Adequate for Discharge Goal: Individualized Educational Video(s) Outcome: Adequate for Discharge   Problem: Activity: Goal: Ability to avoid complications of mobility impairment will improve Outcome: Adequate for Discharge Goal: Range of joint motion will improve Outcome: Adequate for Discharge   Problem: Clinical Measurements: Goal: Postoperative complications will be avoided or minimized Outcome: Adequate for Discharge   Problem: Pain Management: Goal: Pain level will decrease with appropriate interventions Outcome: Adequate for Discharge   Problem: Skin Integrity: Goal: Will show signs of wound healing Outcome: Adequate for Discharge

## 2024-03-08 NOTE — Progress Notes (Signed)
 Subjective: 4 Days Post-Op Procedure(s) (LRB): ARTHROPLASTY, KNEE, TOTAL (Left) Patient reports pain as mild.    Objective: Vital signs in last 24 hours: Temp:  [98.2 F (36.8 C)-98.9 F (37.2 C)] 98.9 F (37.2 C) (09/30 0627) Pulse Rate:  [59-76] 76 (09/30 0627) Resp:  [13-20] 18 (09/30 0627) BP: (102-119)/(64-78) 110/78 (09/30 0627) SpO2:  [96 %-98 %] 96 % (09/30 0627)  Intake/Output from previous day: 09/29 0701 - 09/30 0700 In: 240 [P.O.:240] Out: 1200 [Urine:1200] Intake/Output this shift: No intake/output data recorded.  No results for input(s): HGB in the last 72 hours. No results for input(s): WBC, RBC, HCT, PLT in the last 72 hours. No results for input(s): NA, K, CL, CO2, BUN, CREATININE, GLUCOSE, CALCIUM  in the last 72 hours. No results for input(s): LABPT, INR in the last 72 hours.  Neurologically intact Neurovascular intact Sensation intact distally Intact pulses distally Dorsiflexion/Plantar flexion intact Incision: dressing C/D/I Compartment soft   Assessment/Plan: 4 Days Post-Op Procedure(s) (LRB): ARTHROPLASTY, KNEE, TOTAL (Left) Advance diet Up with therapy D/C IV fluids Discharge to SNF Up with therapy once insurance approves and bed is available WBAT LLE F/u with ortho two weeks po     Ronal LITTIE Grave 03/08/2024, 7:41 AM

## 2024-03-09 DIAGNOSIS — M1712 Unilateral primary osteoarthritis, left knee: Secondary | ICD-10-CM | POA: Diagnosis not present

## 2024-03-09 NOTE — Progress Notes (Signed)
 Physical Therapy Treatment Patient Details Name: Thomas Mcneil MRN: [id number] DOB: 1951/04/22 Today's Date: 03/09/2024   History of Present Illness Pt is 73 yo L TKA on 03/04/24.  Pt with hx including but not limited to R THA 10/19/23, arthritis, COPD, DVT, HTN, MGUS, neuropathy, tremor    PT Comments  Pt agreeable to therapy session. Reports pain as controlled. Progressing with mobility. Able to mobilize with +1 assist on today. Ambulation distance limited by fatigue, pain. Plan is for ST SNF rehab.    If plan is discharge home, recommend the following: A lot of help with bathing/dressing/bathroom;Help with stairs or ramp for entrance;A little help with walking and/or transfers;A little help with bathing/dressing/bathroom   Can travel by private vehicle        Equipment Recommendations  None recommended by PT    Recommendations for Other Services       Precautions / Restrictions Precautions Precautions: Fall;Knee Restrictions Weight Bearing Restrictions Per Provider Order: No LLE Weight Bearing Per Provider Order: Weight bearing as tolerated     Mobility  Bed Mobility Overal bed mobility: Needs Assistance Bed Mobility: Supine to Sit     Supine to sit: Min assist, HOB elevated     General bed mobility comments: light assist to progress LLE on and off bed. increased time.    Transfers Overall transfer level: Needs assistance Equipment used: Rolling walker (2 wheels) Transfers: Sit to/from Stand Sit to Stand: Min assist, From elevated surface           General transfer comment: ongoing education for technique, hand placement, LLE position prior to descent. assist to rise and steady. much improved control of descent to chair.    Ambulation/Gait Ambulation/Gait assistance: Min assist Gait Distance (Feet): 58 Feet Assistive device: Rolling walker (2 wheels) Gait Pattern/deviations: Step-to pattern, Narrow base of support       General Gait Details: pt requiring  assist to balance, wt shift and UE approximation to control tremors and RW position. cues for RW position and safety. pt demonstrates carryover with proper foot position and RW placement with turns. distance limited by fatigue.   Stairs             Wheelchair Mobility     Tilt Bed    Modified Rankin (Stroke Patients Only)       Balance Overall balance assessment: Needs assistance         Standing balance support: Bilateral upper extremity supported, During functional activity, Reliant on assistive device for balance Standing balance-Leahy Scale: Poor                              Communication Communication Communication: No apparent difficulties  Cognition Arousal: Alert Behavior During Therapy: WFL for tasks assessed/performed   PT - Cognitive impairments: No apparent impairments                         Following commands: Intact      Cueing Cueing Techniques: Verbal cues  Exercises Total Joint Exercises Ankle Circles/Pumps: AROM, Both, 10 reps Quad Sets: AROM, Both, 10 reps Heel Slides: AAROM, Left, 10 reps Heel Slides Limitations: pain. low threshold for flexion  at this time; encouraged pt to  work on knee flexion exercises after pain level decreases Straight Leg Raises: AROM, Strengthening, Left, 10 reps Goniometric ROM: ~5-70 degrees (seated flexion)    General Comments  Pertinent Vitals/Pain Pain Assessment Pain Assessment: Faces Faces Pain Scale: Hurts little more Pain Location: L knee with activity Pain Descriptors / Indicators: Grimacing, Guarding, Sore Pain Intervention(s): Limited activity within patient's tolerance, Ice applied, Repositioned    Home Living                          Prior Function            PT Goals (current goals can now be found in the care plan section) Progress towards PT goals: Progressing toward goals    Frequency    7X/week      PT Plan      Co-evaluation               AM-PAC PT 6 Clicks Mobility   Outcome Measure  Help needed turning from your back to your side while in a flat bed without using bedrails?: A Little Help needed moving from lying on your back to sitting on the side of a flat bed without using bedrails?: A Little Help needed moving to and from a bed to a chair (including a wheelchair)?: A Little Help needed standing up from a chair using your arms (e.g., wheelchair or bedside chair)?: A Little Help needed to walk in hospital room?: A Little Help needed climbing 3-5 steps with a railing? : A Lot 6 Click Score: 17    End of Session Equipment Utilized During Treatment: Gait belt Activity Tolerance: Patient tolerated treatment well Patient left: in chair;with call bell/phone within reach   PT Visit Diagnosis: Other abnormalities of gait and mobility (R26.89);Muscle weakness (generalized) (M62.81)     Time: 8966-8942 PT Time Calculation (min) (ACUTE ONLY): 24 min  Charges:    $Gait Training: 8-22 mins $Therapeutic Exercise: 8-22 mins PT General Charges $$ ACUTE PT VISIT: 1 Visit                        Dannial SQUIBB, PT Acute Rehabilitation  Office: [phone]

## 2024-03-09 NOTE — Progress Notes (Signed)
 Subjective: 5 Days Post-Op Procedure(s) (LRB): ARTHROPLASTY, KNEE, TOTAL (Left) Patient reports pain as moderate.    Objective: Vital signs in last 24 hours: Temp:  [97.8 F (36.6 C)-99.5 F (37.5 C)] 98.5 F (36.9 C) (10/01 0518) Pulse Rate:  [63-74] 64 (10/01 0518) Resp:  [18-19] 19 (10/01 0518) BP: (111-136)/(60-82) 135/82 (10/01 0518) SpO2:  [95 %-96 %] 96 % (10/01 0518)  Intake/Output from previous day: 09/30 0701 - 10/01 0700 In: 360 [P.O.:360] Out: 1200 [Urine:1200] Intake/Output this shift: No intake/output data recorded.  No results for input(s): HGB in the last 72 hours. No results for input(s): WBC, RBC, HCT, PLT in the last 72 hours. No results for input(s): NA, K, CL, CO2, BUN, CREATININE, GLUCOSE, CALCIUM  in the last 72 hours. No results for input(s): LABPT, INR in the last 72 hours.  Neurologically intact Neurovascular intact Sensation intact distally Intact pulses distally Dorsiflexion/Plantar flexion intact Incision: dressing C/D/I No cellulitis present Compartment soft   Assessment/Plan: 5 Days Post-Op Procedure(s) (LRB): ARTHROPLASTY, KNEE, TOTAL (Left) Advance diet Up with therapy D/C IV fluids WBAT LLE D/c to SNF      Ronal LITTIE Grave 03/09/2024, 7:10 AM

## 2024-03-09 NOTE — Plan of Care (Signed)

## 2024-03-09 NOTE — Plan of Care (Signed)

## 2024-03-09 NOTE — TOC PASRR Note (Deleted)
 30 Day PASRR Note   Patient Details  Name: Thomas Mcneil Date of Birth: [birth year]   Transition of Care [facility]) CM/SW Contact:    Alfonse JONELLE Rex, RN Phone Number: 03/09/2024, 10:49 AM  To Whom It May Concern:  Please be advised that this patient will require a short-term nursing home stay - anticipated 30 days or less for rehabilitation and strengthening.   The plan is for return home.

## 2024-03-09 NOTE — Progress Notes (Signed)
 IV removed, belongings packed, AVS and paperwork ready for PTAR, report called to Heywood Hertz RN at [phone]. Pending PTAR pick up .SABRASABRASABRASABRA

## 2024-03-09 NOTE — TOC Transition Note (Signed)
 Transition of Care [facility]) - Discharge Note   Patient Details  Name: Thomas Mcneil MRN: [id number] Date of Birth: May 09, 1951  Transition of Care [facility]) CM/SW Contact:  Alfonse JONELLE Rex, RN Phone Number: 03/09/2024, 11:35 AM   Clinical Narrative:   DC to SNF [facility]). RM 140 Patient's sister, [name], notified. PTAR for transport. No further TOC needs identified at this time.     Final next level of care: Skilled Nursing Facility Barriers to Discharge: Barriers Resolved   Patient Goals and CMS Choice Patient states their goals for this hospitalization and ongoing recovery are:: return home CMS Medicare.gov Compare Post Acute Care list provided to:: Patient Represenative (must comment) Preston Fanti (sister) [phone]) Choice offered to / list presented to : [facility] POA / Guardian, Sibling [place] ownership interest in [facility]provided to:: Sibling    Discharge Placement              Patient chooses bed at:  [facility]) Patient to be transferred to facility by: PTAR Name of family member notified: [name] (sister) [phone] Patient and family notified of of transfer: 03/09/24  Discharge Plan and Services Additional resources added to the After Visit Summary for                                       Social Drivers of Health (SDOH) Interventions SDOH Screenings   Food Insecurity: No Food Insecurity (03/04/2024)  Housing: Low Risk  (03/04/2024)  Transportation Needs: No Transportation Needs (03/04/2024)  Utilities: At Risk (03/04/2024)  Depression (PHQ2-9): Low Risk  (01/01/2023)  Social Connections: Socially Isolated (03/04/2024)  Tobacco Use: High Risk (03/04/2024)     Readmission Risk Interventions     No data to display

## 2024-03-14 ENCOUNTER — Encounter: Admitting: Gastroenterology

## 2024-03-14 ENCOUNTER — Telehealth: Payer: Self-pay | Admitting: Gastroenterology

## 2024-03-14 ENCOUNTER — Other Ambulatory Visit (HOSPITAL_COMMUNITY): Payer: Self-pay

## 2024-03-14 NOTE — Telephone Encounter (Signed)
 Good Afternoon Dr. Legrand,  I called this patient at 2:10pm he stated he was trying to call us  to let us  know he was not coming for his procedure.  He Stated he was in the hospital and was just released and is in a nursing home.  He stated he would call back to reschedule.  I will NO SHOW him.  [employer]

## 2024-03-18 ENCOUNTER — Ambulatory Visit (INDEPENDENT_AMBULATORY_CARE_PROVIDER_SITE_OTHER): Admitting: Physician Assistant

## 2024-03-18 DIAGNOSIS — Z96652 Presence of left artificial knee joint: Secondary | ICD-10-CM

## 2024-03-18 MED ORDER — OXYCODONE-ACETAMINOPHEN 7.5-325 MG PO TABS: 1.0000 | ORAL_TABLET | Freq: Three times a day (TID) | ORAL | 0 refills | Status: AC | PRN

## 2024-03-18 NOTE — Progress Notes (Addendum)
 Post-Op Visit Note   Patient: Thomas Mcneil           Date of Birth: [birth year]           MRN: [id number] Visit Date: 03/18/2024 PCP: Sigrid Deidra Fox, MD   Assessment & Plan:  Chief Complaint:  Chief Complaint  Patient presents with   Left Knee - Follow-up    Left TKA 03/04/2024   Visit Diagnoses:  1. Status post total left knee replacement     Plan: Patient is a pleasant 73 year old gentleman who comes in today 2 weeks status post left total knee replacement.  He has been doing okay.  He has been taking Eliquis  for DVT prophylaxis.  He has been taking Percocet for pain and says this has not been strong enough.  He has been getting home health PT and is currently ambulating with a rollator.  Examination of the left knee reveals a well-healing surgical incision with nylon sutures in place.  No evidence of infection or cellulitis.  Calves are soft nontender.  He is neurovascularly intact distally.  Today, sutures were removed and Steri-Strips applied.  When the distalmost suture was removed his wound started to bleed.  I cleaned this with Betadine  and put into staples.  We then wrapped this with a compression wrap.  He will follow-up next week to have the staples removed.  He will then follow-up when he is 6 weeks out for repeat evaluation and 2 view x-rays.  I have sent in a prescription of Percocet 7.5.  I have sent in a referral for outpatient PT.  Call with concerns or questions.  Follow-Up Instructions: Return in about 1 week (around 03/25/2024) for with Dr. Jerri.   Orders:  Orders Placed This Encounter  Procedures   Ambulatory referral to Physical Therapy   Meds ordered this encounter  Medications   oxyCODONE -acetaminophen  (PERCOCET) 7.5-325 MG tablet    Sig: Take 1-2 tablets by mouth every 8 (eight) hours as needed for severe pain (pain score 7-10).    Dispense:  20 tablet    Refill:  0    Imaging: No new imaging  PMFS History: Patient Active Problem List    Diagnosis Date Noted   Status post total left knee replacement 03/04/2024   Primary osteoarthritis of left knee 02/12/2024   Status post total replacement of right hip 10/19/2023   Pressure injury of skin of left buttock 10/13/2023   Primary osteoarthritis of right hip 09/08/2023   Cocaine dependence, binge pattern (HCC) 11/26/2015   Substance induced mood disorder (HCC) 11/26/2015   Intention tremor 11/26/2015   Alcohol  use disorder, severe, dependence (HCC) 09/04/2015   Severe recurrent major depression without psychotic features (HCC) 09/04/2015   Alcohol  dependence, uncomplicated (HCC) 09/01/2015   Alcohol -induced mood disorder (HCC) 09/01/2015   Cocaine abuse (HCC) 09/01/2015   Alcohol  abuse 04/05/2014   Essential tremor 01/04/2014   Other and unspecified hyperlipidemia 01/04/2014   B12 deficiency 01/04/2014   Melena 10/23/2012   GERD 05/27/2010   SHOULDER PAIN, LEFT 05/21/2010   ONYCHOMYCOSIS, TOENAILS 05/09/2010   KNEE PAIN, BILATERAL 06/27/2009   LEG CRAMPS 06/27/2009   Contusion 08/18/2008   LEG EDEMA, BILATERAL 01/13/2008   ERECTILE DYSFUNCTION 10/15/2007   TOBACCO ABUSE 08/16/2007   Mononeuritis of lower limb 08/16/2007   CORNS AND CALLUSES 08/16/2007   ALCOHOL  ABUSE, HX OF 08/16/2007   Past Medical History:  Diagnosis Date   Anemia    Arthritis    Complication of anesthesia  slow to wake up after last surgery   COPD (chronic obstructive pulmonary disease) (HCC)    Depression    DVT (deep venous thrombosis) (HCC)    GERD (gastroesophageal reflux disease)    Hepatitis    hep c   High blood pressure    High cholesterol    MGUS (monoclonal gammopathy of unknown significance)    Neuropathy    Peripheral neuropathy    feet and hands    Family History  Problem Relation Age of Onset   Hypertension Mother    Diverticulosis Mother    GER disease Mother    Lung disease Sister    Polymyositis Sister    Alcohol  abuse Maternal Uncle     Past Surgical  History:  Procedure Laterality Date   DIRECT LARYNGOSCOPY  12/26/2011   Procedure: DIRECT LARYNGOSCOPY;  Surgeon: Merilee Kraft, MD;  Location: [facility] OR;  Service: ENT;  Laterality: N/A;  Directy Laryngoscopy with biopsy [id number]   DIRECT LARYNGOSCOPY N/A 10/21/2012   Procedure: DIRECT LARYNGOSCOPY;  Surgeon: Merilee Kraft, MD;  Location: [facility] OR;  Service: ENT;  Laterality: N/A;   ESOPHAGOGASTRODUODENOSCOPY N/A 10/25/2012   Procedure: ESOPHAGOGASTRODUODENOSCOPY (EGD);  Surgeon: Renaye Sous, MD;  Location: [facility] ENDOSCOPY;  Service: Endoscopy;  Laterality: N/A;   TONSILLECTOMY     TOTAL HIP ARTHROPLASTY Right 10/19/2023   Procedure: ARTHROPLASTY, HIP, TOTAL, ANTERIOR APPROACH;  Surgeon: Jerri Kay HERO, MD;  Location: [facility] OR;  Service: Orthopedics;  Laterality: Right;   TOTAL KNEE ARTHROPLASTY Left 03/04/2024   Procedure: ARTHROPLASTY, KNEE, TOTAL;  Surgeon: Jerri Kay HERO, MD;  Location: [facility] ORS;  Service: Orthopedics;  Laterality: Left;   Social History   Occupational History   Not on file  Tobacco Use   Smoking status: Some Days    Current packs/day: 0.25    Average packs/day: 0.3 packs/day for 46.2 years (11.5 ttl pk-yrs)    Types: Cigarettes    Start date: 06/09/2012   Smokeless tobacco: Never   Tobacco comments:    quitting, using e-cig    05/12/2022- smokes on occasion  Vaping Use   Vaping status: Former  Substance and Sexual Activity   Alcohol  use: Not Currently    Alcohol /week: 70.0 standard drinks of alcohol     Types: 70 Cans of beer per week    Comment: [place] - currently in [facility] for counseling/rehab. last drink 06/29/14   Drug use: Not Currently    Frequency: 0.2 times per week    Types: Crack cocaine    Comment: Crack quit 06/29/14   Sexual activity: Not on file

## 2024-03-28 ENCOUNTER — Encounter: Payer: Self-pay | Admitting: Rehabilitative and Restorative Service Providers"

## 2024-03-28 ENCOUNTER — Ambulatory Visit: Admitting: Rehabilitative and Restorative Service Providers"

## 2024-03-28 DIAGNOSIS — G8929 Other chronic pain: Secondary | ICD-10-CM

## 2024-03-28 DIAGNOSIS — R600 Localized edema: Secondary | ICD-10-CM

## 2024-03-28 DIAGNOSIS — M25662 Stiffness of left knee, not elsewhere classified: Secondary | ICD-10-CM | POA: Diagnosis not present

## 2024-03-28 DIAGNOSIS — M6281 Muscle weakness (generalized): Secondary | ICD-10-CM

## 2024-03-28 DIAGNOSIS — M25562 Pain in left knee: Secondary | ICD-10-CM | POA: Diagnosis not present

## 2024-03-28 DIAGNOSIS — R262 Difficulty in walking, not elsewhere classified: Secondary | ICD-10-CM

## 2024-03-28 NOTE — Therapy (Signed)
 OUTPATIENT PHYSICAL THERAPY EVALUATION   Patient Name: WELDEN HAUSMANN MRN: [id number] DOB:December 04, 1950, 73 y.o., male Today's Date: 03/28/2024  END OF SESSION:  PT End of Session - 03/28/24 1020     Visit Number 1    Number of Visits 20    Date for Recertification  06/06/24    Authorization Type [employer] $30 copay    Progress Note Due on Visit 10    PT Start Time 1022    PT Stop Time 1055    PT Time Calculation (min) 33 min    Activity Tolerance Patient limited by pain;Patient limited by fatigue    Behavior During Therapy [facility] for tasks assessed/performed          Past Medical History:  Diagnosis Date   Anemia    Arthritis    Complication of anesthesia    slow to wake up after last surgery   COPD (chronic obstructive pulmonary disease) (HCC)    Depression    DVT (deep venous thrombosis) (HCC)    GERD (gastroesophageal reflux disease)    Hepatitis    hep c   High blood pressure    High cholesterol    MGUS (monoclonal gammopathy of unknown significance)    Neuropathy    Peripheral neuropathy    feet and hands   Past Surgical History:  Procedure Laterality Date   DIRECT LARYNGOSCOPY  12/26/2011   Procedure: DIRECT LARYNGOSCOPY;  Surgeon: Merilee Kraft, MD;  Location: [facility] OR;  Service: ENT;  Laterality: N/A;  Directy Laryngoscopy with biopsy [id number]   DIRECT LARYNGOSCOPY N/A 10/21/2012   Procedure: DIRECT LARYNGOSCOPY;  Surgeon: Merilee Kraft, MD;  Location: [facility] OR;  Service: ENT;  Laterality: N/A;   ESOPHAGOGASTRODUODENOSCOPY N/A 10/25/2012   Procedure: ESOPHAGOGASTRODUODENOSCOPY (EGD);  Surgeon: Renaye Sous, MD;  Location: [facility] ENDOSCOPY;  Service: Endoscopy;  Laterality: N/A;   TONSILLECTOMY     TOTAL HIP ARTHROPLASTY Right 10/19/2023   Procedure: ARTHROPLASTY, HIP, TOTAL, ANTERIOR APPROACH;  Surgeon: Jerri Kay HERO, MD;  Location: [facility] OR;  Service: Orthopedics;  Laterality: Right;   TOTAL KNEE ARTHROPLASTY Left 03/04/2024   Procedure: ARTHROPLASTY, KNEE, TOTAL;  Surgeon:  Jerri Kay HERO, MD;  Location: [facility] ORS;  Service: Orthopedics;  Laterality: Left;   Patient Active Problem List   Diagnosis Date Noted   Status post total left knee replacement 03/04/2024   Primary osteoarthritis of left knee 02/12/2024   Status post total replacement of right hip 10/19/2023   Pressure injury of skin of left buttock 10/13/2023   Primary osteoarthritis of right hip 09/08/2023   Cocaine dependence, binge pattern (HCC) 11/26/2015   Substance induced mood disorder (HCC) 11/26/2015   Intention tremor 11/26/2015   Alcohol  use disorder, severe, dependence (HCC) 09/04/2015   Severe recurrent major depression without psychotic features (HCC) 09/04/2015   Alcohol  dependence, uncomplicated (HCC) 09/01/2015   Alcohol -induced mood disorder (HCC) 09/01/2015   Cocaine abuse (HCC) 09/01/2015   Alcohol  abuse 04/05/2014   Essential tremor 01/04/2014   Other and unspecified hyperlipidemia 01/04/2014   B12 deficiency 01/04/2014   Melena 10/23/2012   GERD 05/27/2010   SHOULDER PAIN, LEFT 05/21/2010   ONYCHOMYCOSIS, TOENAILS 05/09/2010   KNEE PAIN, BILATERAL 06/27/2009   LEG CRAMPS 06/27/2009   Contusion 08/18/2008   LEG EDEMA, BILATERAL 01/13/2008   ERECTILE DYSFUNCTION 10/15/2007   TOBACCO ABUSE 08/16/2007   Mononeuritis of lower limb 08/16/2007   CORNS AND CALLUSES 08/16/2007   ALCOHOL  ABUSE, HX OF 08/16/2007    PCP: Sigrid Deidra Fox MD  REFERRING PROVIDER: Jule Ronal LITTIE DEVONNA  REFERRING DIAG: [phone] (ICD-10-CM) - Status post total left knee replacement  THERAPY DIAG:  Chronic pain of left knee  Muscle weakness (generalized)  Stiffness of left knee  Difficulty in walking, not elsewhere classified  Localized edema  Rationale for Evaluation and Treatment: Rehabilitation  ONSET DATE: Lt TKA 03/04/2024  SUBJECTIVE:   SUBJECTIVE STATEMENT: Pt came to clinic s/p Lt TKA on 03/04/2024.  Pt indicated having soreness and tightness noted.  Did report feeling more  confident in knee.  Pt indicated using rollator for a whileprior to surgery.  Had thought about trying to walk independent.  Worse at night and when waking up in the morning.   PERTINENT HISTORY: Anemia, COPD, Depression, DVT history, GERD, Hep C, High blood pressure, High cholesterol, peripheral neuropathy.   PAIN:  NPRS scale: at worst 7/10, low levels at best.  Pain location: Lt knee  Pain description: stiffness, achy.  Sharp pains at times.  Aggravating factors: prolonged walking,standing, nighttime.  Stiffness with sitting.  Relieving factors: Ice machine. Tylenol    PRECAUTIONS: None  WEIGHT BEARING RESTRICTIONS: No  FALLS:  Has patient fallen in last 6 months? No  LIVING ENVIRONMENT: Lives with: Lives with someone.  Lives in: House/apartment Stairs: has ramp.   Has following equipment at home: FWW, cane.  Elevated toilet seat, railings in bathroom.     OCCUPATION: No work.   PLOF: Independent.  Sit and watch TV, computer.   Has riding mowing.   Doesn't drive.   Usually does groceries shopping.   PATIENT GOALS: Reduce pain, improve walking.   OBJECTIVE:   PATIENT SURVEYS:  Patient-Specific Activity Scoring Scheme  0 represents "unable to perform." 10 represents "able to perform at prior level. 0 1 2 3 4 5 6 7 8 9  10 (Date and Score)   Activity Eval  03/28/2024 Did not gather due to patient difficulty in writing.  Will ask next viist    1.      2.       3.     4.    5.    Score     Total score = sum of the activity scores/number of activities Minimum detectable change (90%CI) for average score = 2 points Minimum detectable change (90%CI) for single activity score = 3 points  COGNITION: 03/28/2024 Overall cognitive status: WFL    SENSATION: 03/28/2024 Not tested  EDEMA:  03/28/2024 Visual Lt leg edema from knee to ankle/lower leg.   MUSCLE LENGTH: 03/28/2024 No specific testing today  POSTURE:  03/28/2024 Mild forward lean in standing,  walking.  Tremors noted in UE control.   PALPATION: 03/28/2024 Mild tenderness around incision and anterior knee.   LOWER EXTREMITY ROM:   ROM Right Eval 03/28/2024 Left Eval 03/28/2024  Hip flexion    Hip extension    Hip abduction    Hip adduction    Hip internal rotation    Hip external rotation    Knee flexion  110 AROM in supine heel slide  Knee extension  -10 seated LAQ AROM    Ankle dorsiflexion    Ankle plantarflexion    Ankle inversion    Ankle eversion     (Blank rows = not tested)  LOWER EXTREMITY MMT:  MMT Right Eval 03/28/2024 Left Eval 03/28/2024  Hip flexion 5/5 4+/5  Hip extension    Hip abduction    Hip adduction    Hip internal rotation    Hip external rotation  Knee flexion 5/5 5/5  Knee extension 5/5 4/5  Ankle dorsiflexion 5/5 5/5  Ankle plantarflexion    Ankle inversion    Ankle eversion     (Blank rows = not tested)  LOWER EXTREMITY SPECIAL TESTS:  03/28/2024 No specific testing.   FUNCTIONAL TESTS:  03/28/2024 18 inch chair transfer: unable s UE assist.  Lt SLS: unable   TUG with rollator 17 seconds  GAIT: 03/28/2024 Ambulation in clinic distances with RWW with reduced gait speed, step length with Rt leg due to lack of stance and toe off progression on Lt leg.  Lacking TKE in stance.                                                                                                                                                                         TODAY'S TREATMENT                                                                          DATE:  03/28/2024 Therex:    HEP instruction/performance c cues for techniques, handout provided.  Trial set performed of each for comprehension and symptom assessment.  See below for exercise list Supine TKE 3 mins trial.   Manual Supine Lt knee flexion c distraction/IR mobilization c movement in elevation.   Self Care Education on edema reduction importance with emphasis on  elevation, ice use, ankle pumps for reducing swelling within LE.   Education given during vaso time.   Vaso 10 mins medium compression in elevation Lt leg 34 deg   PATIENT EDUCATION:  Education details: HEP, POC Person educated: Patient Education method: Programmer, multimedia, Demonstration, Verbal cues, and Handouts Education comprehension: verbalized understanding, returned demonstration, and verbal cues required  HOME EXERCISE PROGRAM: Access Code: [email or web address] URL: [email or web address] Date: 03/28/2024 Prepared by: Ozell Silvan  Exercises - Supine Heel Slide  - 3-5 x daily - 7 x weekly - 1 sets - 10 reps - 5 hold - Supine Heel Slide with Strap  - 3-5 x daily - 7 x weekly - 1 sets - 10 reps - 5 hold - Seated Long Arc Quad  - 3-5 x daily - 7 x weekly - 1 sets - 5-10 reps - 2 hold - Seated Quad Set (Mirrored)  - 3-5 x daily - 7 x weekly - 1 sets - 10 reps - 5 hold - Quad Setting and Stretching  - 3-5 x daily - 7 x weekly - 1 sets -  10 reps - 5 hold - Supine Knee Extension Mobilization with Weight (Mirrored)  - 4-5 x daily - 7 x weekly - 1 sets - 1 reps - to tolerance up to 5 mins hold  ASSESSMENT:  CLINICAL IMPRESSION: Patient is a 73 y.o. who comes to clinic with complaints of Lt knee pain s/p Lt TKA with mobility, strength and movement coordination deficits that impair their ability to perform usual daily and recreational functional activities without increase difficulty/symptoms at this time.  Patient to benefit from skilled PT services to address impairments and limitations to improve to previous level of function without restriction secondary to condition.   OBJECTIVE IMPAIRMENTS: Abnormal gait, decreased activity tolerance, decreased balance, decreased coordination, decreased endurance, decreased mobility, difficulty walking, decreased ROM, decreased strength, hypomobility, increased edema, increased fascial restrictions, impaired perceived functional ability, impaired  flexibility, improper body mechanics, postural dysfunction, and pain.   ACTIVITY LIMITATIONS: carrying, lifting, bending, sitting, standing, squatting, sleeping, stairs, transfers, reach over head, and locomotion level  PARTICIPATION LIMITATIONS: meal prep, cleaning, laundry, interpersonal relationship, shopping, community activity, and yard work  PERSONAL FACTORS: Anemia, COPD, Depression, DVT history, GERD, Hep C, High blood pressure, High cholesterol, peripheral neuropathy. are also affecting patient's functional outcome.   REHAB POTENTIAL: Good  CLINICAL DECISION MAKING: Evolving/moderate complexity  EVALUATION COMPLEXITY: Moderate   GOALS: Goals reviewed with patient? Yes  SHORT TERM GOALS: (target date for Short term goals are 3 weeks 04/18/2024)   1.  Patient will demonstrate independent use of home exercise program to maintain progress from in clinic treatments.  Goal status: New  LONG TERM GOALS: (target dates for all long term goals are 10 weeks  06/06/2024 )   1. Patient will demonstrate/report pain at worst less than or equal to 2/10 to facilitate minimal limitation in daily activity secondary to pain symptoms.  Goal status: New   2. Patient will demonstrate independent use of home exercise program to facilitate ability to maintain/progress functional gains from skilled physical therapy services.  Goal status: New   3. Patient will demonstrate Patient specific functional scale avg > or = 8/10 to indicate reduced disability due to condition.   Goal status: New   4.  Patient will demonstrate Lt LE MMT 5/5 throughout to faciltiate usual transfers, stairs, squatting at [facility] for daily life.   Goal status: New   5.  Patient will demonstrate LRAD ambulation community distances > 500 ft.  Goal status: New   6.  Patient will demonstrate ascending/descending stairs reciprocally s UE assist for community integration, getting on /off mower.   Goal status: New   7.   Patient will demonstrate TUG with LRAD < 14 seconds to indicate reduced fall risk  Goal Status: New   PLAN:  PT FREQUENCY: 1-2x/week  PT DURATION: 10 weeks  PLANNED INTERVENTIONS: Can include 02853- PT Re-evaluation, 97110-Therapeutic exercises, 97530- Therapeutic activity, [id number]- Neuromuscular re-education, 97535- Self Care, 97140- Manual therapy, [phone]- Gait training, [phone]- Orthotic Fit/training, [phone]- Canalith repositioning, [id number]- Aquatic Therapy, [phone]- Electrical stimulation (unattended), [id number] Physical performance testing, 97016- Vasopneumatic device, [id number]- Ultrasound, [id number]- Traction (mechanical), [id number]- Ionotophoresis 4mg /ml Dexamethasone ,  79439 - Needle insertion w/o injection 1 or 2 muscles, 20561 - Needle insertion w/o injection 3 or more muscles.   Patient/Family education, Balance training, Stair training, Taping, Dry Needling, Joint mobilization, Joint manipulation, Spinal manipulation, Spinal mobilization, Scar mobilization, Vestibular training, Visual/preceptual remediation/compensation, DME instructions, Cryotherapy, and Moist heat.  All performed as medically necessary.  All included unless contraindicated  PLAN FOR NEXT  SESSION: Review HEP knowledge/results. Extension gains and strengthening.  Vaso.  PSFS completion (did not gather on eval).    Ozell Silvan, PT, DPT, OCS, ATC 03/28/24  10:57 AM

## 2024-03-29 ENCOUNTER — Other Ambulatory Visit: Payer: Self-pay

## 2024-03-29 ENCOUNTER — Ambulatory Visit: Admitting: Physician Assistant

## 2024-03-29 DIAGNOSIS — Z96652 Presence of left artificial knee joint: Secondary | ICD-10-CM

## 2024-03-29 MED ORDER — OXYCODONE-ACETAMINOPHEN 5-325 MG PO TABS: 1.0000 | ORAL_TABLET | Freq: Two times a day (BID) | ORAL | 0 refills | Status: AC | PRN

## 2024-03-29 NOTE — Progress Notes (Signed)
 Post-Op Visit Note   Patient: Thomas Mcneil           Date of Birth: May 03, 1951           MRN: [id number] Visit Date: 03/29/2024 PCP: Sigrid Deidra Fox, MD   Assessment & Plan:  Chief Complaint:  Chief Complaint  Patient presents with   Left Knee - Pain   Visit Diagnoses:  1. Status post total left knee replacement     Plan: patient is a pleasant 73 year old gentleman who comes in today approximately 3 weeks status post left total knee replacement.  He has been doing well.  He has not been experiencing any drainage from his knee.  He has been in physical therapy making good progress.  He is taking Eliquis  for DVT prophylaxis.  Examination of his left knee reveals a well-healed surgical incision.  He does have 2 staples to the distal incision.  No drainage or signs of cellulitis or infection.  Calves are soft nontender.  He is neurovascular intact distally.  Today, staples were removed.  He will continue with physical therapy.  Continue with Eliquis  when weaned to a baby aspirin twice daily once finished with the Eliquis .  Follow-up in 3 weeks for recheck.  Call with concerns or questions.  Follow-Up Instructions: Return in about 3 weeks (around 04/19/2024).   Orders:  Orders Placed This Encounter  Procedures   XR Knee 1-2 Views Left   Meds ordered this encounter  Medications   oxyCODONE -acetaminophen  (PERCOCET) 5-325 MG tablet    Sig: Take 1-2 tablets by mouth 2 (two) times daily as needed. To be taken after surgery    Dispense:  40 tablet    Refill:  0    Imaging: No results found.  PMFS History: Patient Active Problem List   Diagnosis Date Noted   Status post total left knee replacement 03/04/2024   Primary osteoarthritis of left knee 02/12/2024   Status post total replacement of right hip 10/19/2023   Pressure injury of skin of left buttock 10/13/2023   Primary osteoarthritis of right hip 09/08/2023   Cocaine dependence, binge pattern (HCC) 11/26/2015    Substance induced mood disorder (HCC) 11/26/2015   Intention tremor 11/26/2015   Alcohol  use disorder, severe, dependence (HCC) 09/04/2015   Severe recurrent major depression without psychotic features (HCC) 09/04/2015   Alcohol  dependence, uncomplicated (HCC) 09/01/2015   Alcohol -induced mood disorder (HCC) 09/01/2015   Cocaine abuse (HCC) 09/01/2015   Alcohol  abuse 04/05/2014   Essential tremor 01/04/2014   Other and unspecified hyperlipidemia 01/04/2014   B12 deficiency 01/04/2014   Melena 10/23/2012   GERD 05/27/2010   SHOULDER PAIN, LEFT 05/21/2010   ONYCHOMYCOSIS, TOENAILS 05/09/2010   KNEE PAIN, BILATERAL 06/27/2009   LEG CRAMPS 06/27/2009   Contusion 08/18/2008   LEG EDEMA, BILATERAL 01/13/2008   ERECTILE DYSFUNCTION 10/15/2007   TOBACCO ABUSE 08/16/2007   Mononeuritis of lower limb 08/16/2007   CORNS AND CALLUSES 08/16/2007   ALCOHOL  ABUSE, HX OF 08/16/2007   Past Medical History:  Diagnosis Date   Anemia    Arthritis    Complication of anesthesia    slow to wake up after last surgery   COPD (chronic obstructive pulmonary disease) (HCC)    Depression    DVT (deep venous thrombosis) (HCC)    GERD (gastroesophageal reflux disease)    Hepatitis    hep c   High blood pressure    High cholesterol    MGUS (monoclonal gammopathy of unknown significance)  Neuropathy    Peripheral neuropathy    feet and hands    Family History  Problem Relation Age of Onset   Hypertension Mother    Diverticulosis Mother    GER disease Mother    Lung disease Sister    Polymyositis Sister    Alcohol  abuse Maternal Uncle     Past Surgical History:  Procedure Laterality Date   DIRECT LARYNGOSCOPY  12/26/2011   Procedure: DIRECT LARYNGOSCOPY;  Surgeon: Merilee Kraft, MD;  [facility] OR;  Service: ENT;  Laterality: N/A;  Directy Laryngoscopy with biopsy [id number]   DIRECT LARYNGOSCOPY N/A 10/21/2012   Procedure: DIRECT LARYNGOSCOPY;  Surgeon: Merilee Kraft, MD;  Location: [facility] OR;   Service: ENT;  Laterality: N/A;   ESOPHAGOGASTRODUODENOSCOPY N/A 10/25/2012   Procedure: ESOPHAGOGASTRODUODENOSCOPY (EGD);  Surgeon: Renaye Sous, MD;  Location: [facility] ENDOSCOPY;  Service: Endoscopy;  Laterality: N/A;   TONSILLECTOMY     TOTAL HIP ARTHROPLASTY Right 10/19/2023   Procedure: ARTHROPLASTY, HIP, TOTAL, ANTERIOR APPROACH;  Surgeon: Jerri Kay HERO, MD;  Location: [facility] OR;  Service: Orthopedics;  Laterality: Right;   TOTAL KNEE ARTHROPLASTY Left 03/04/2024   Procedure: ARTHROPLASTY, KNEE, TOTAL;  Surgeon: Jerri Kay HERO, MD;  Location: [facility] ORS;  Service: Orthopedics;  Laterality: Left;   Social History   Occupational History   Not on file  Tobacco Use   Smoking status: Some Days    Current packs/day: 0.25    Average packs/day: 0.3 packs/day for 46.2 years (11.6 ttl pk-yrs)    Types: Cigarettes    Start date: 06/09/2012   Smokeless tobacco: Never   Tobacco comments:    quitting, using e-cig    05/12/2022- smokes on occasion  Vaping Use   Vaping status: Former  Substance and Sexual Activity   Alcohol  use: Not Currently    Alcohol /week: 70.0 standard drinks of alcohol     Types: 70 Cans of beer per week    Comment: Relas[place] - currently in [facility] for counseling/rehab. last drink 06/29/14   Drug use: Not Currently    Frequency: 0.2 times per week    Types: Crack cocaine    Comment: Crack quit 06/29/14   Sexual activity: Not on file

## 2024-03-30 NOTE — Therapy (Incomplete)
 OUTPATIENT PHYSICAL THERAPY EVALUATION   Patient Name: Thomas Mcneil MRN: [id number] DOB:[birth year], 73 y.o., male Today's Date: 03/30/2024  END OF SESSION:    Past Medical History:  Diagnosis Date   Anemia    Arthritis    Complication of anesthesia    slow to wake up after last surgery   COPD (chronic obstructive pulmonary disease) (HCC)    Depression    DVT (deep venous thrombosis) (HCC)    GERD (gastroesophageal reflux disease)    Hepatitis    hep c   High blood pressure    High cholesterol    MGUS (monoclonal gammopathy of unknown significance)    Neuropathy    Peripheral neuropathy    feet and hands   Past Surgical History:  Procedure Laterality Date   DIRECT LARYNGOSCOPY  12/26/2011   Procedure: DIRECT LARYNGOSCOPY;  Surgeon: Merilee Kraft, MD;  Location: [facility] OR;  Service: ENT;  Laterality: N/A;  Directy Laryngoscopy with biopsy [id number]   DIRECT LARYNGOSCOPY N/A 10/21/2012   Procedure: DIRECT LARYNGOSCOPY;  Surgeon: Merilee Kraft, MD;  Location: [facility] OR;  Service: ENT;  Laterality: N/A;   ESOPHAGOGASTRODUODENOSCOPY N/A 10/25/2012   Procedure: ESOPHAGOGASTRODUODENOSCOPY (EGD);  Surgeon: Renaye Sous, MD;  Location: [facility] ENDOSCOPY;  Service: Endoscopy;  Laterality: N/A;   TONSILLECTOMY     TOTAL HIP ARTHROPLASTY Right 10/19/2023   Procedure: ARTHROPLASTY, HIP, TOTAL, ANTERIOR APPROACH;  Surgeon: Jerri Kay HERO, MD;  Location: [facility] OR;  Service: Orthopedics;  Laterality: Right;   TOTAL KNEE ARTHROPLASTY Left 03/04/2024   Procedure: ARTHROPLASTY, KNEE, TOTAL;  Surgeon: Jerri Kay HERO, MD;  Location: [facility] ORS;  Service: Orthopedics;  Laterality: Left;   Patient Active Problem List   Diagnosis Date Noted   Status post total left knee replacement 03/04/2024   Primary osteoarthritis of left knee 02/12/2024   Status post total replacement of right hip 10/19/2023   Pressure injury of skin of left buttock 10/13/2023   Primary osteoarthritis of right hip 09/08/2023   Cocaine dependence,  binge pattern (HCC) 11/26/2015   Substance induced mood disorder (HCC) 11/26/2015   Intention tremor 11/26/2015   Alcohol  use disorder, severe, dependence (HCC) 09/04/2015   Severe recurrent major depression without psychotic features (HCC) 09/04/2015   Alcohol  dependence, uncomplicated (HCC) 09/01/2015   Alcohol -induced mood disorder (HCC) 09/01/2015   Cocaine abuse (HCC) 09/01/2015   Alcohol  abuse 04/05/2014   Essential tremor 01/04/2014   Other and unspecified hyperlipidemia 01/04/2014   B12 deficiency 01/04/2014   Melena 10/23/2012   GERD 05/27/2010   SHOULDER PAIN, LEFT 05/21/2010   ONYCHOMYCOSIS, TOENAILS 05/09/2010   KNEE PAIN, BILATERAL 06/27/2009   LEG CRAMPS 06/27/2009   Contusion 08/18/2008   LEG EDEMA, BILATERAL 01/13/2008   ERECTILE DYSFUNCTION 10/15/2007   TOBACCO ABUSE 08/16/2007   Mononeuritis of lower limb 08/16/2007   CORNS AND CALLUSES 08/16/2007   ALCOHOL  ABUSE, HX OF 08/16/2007    PCP: Sigrid Deidra Fox MD  REFERRING PROVIDER: Jule Ronal CROME, PA-C  REFERRING DIAG: [phone] (ICD-10-CM) - Status post total left knee replacement  THERAPY DIAG:  No diagnosis found.  Rationale for Evaluation and Treatment: Rehabilitation  ONSET DATE: Lt TKA 03/04/2024  SUBJECTIVE:   SUBJECTIVE STATEMENT: ***  PERTINENT HISTORY: Anemia, COPD, Depression, DVT history, GERD, Hep C, High blood pressure, High cholesterol, peripheral neuropathy.  Pt came to clinic s/p Lt TKA on 03/04/2024.  Pt indicated having soreness and tightness noted.  Did report feeling more confident in knee.  Pt indicated using rollator for a whileprior to  surgery.  Had thought about trying to walk independent.  Worse at night and when waking up in the morning.    PAIN:  NPRS scale: at worst 7/10, low levels at best.  Pain location: Lt knee  Pain description: stiffness, achy.  Sharp pains at times.  Aggravating factors: prolonged walking,standing, nighttime.  Stiffness with sitting.   Relieving factors: Ice machine. Tylenol    PRECAUTIONS: None  WEIGHT BEARING RESTRICTIONS: No  FALLS:  Has patient fallen in last 6 months? No  LIVING ENVIRONMENT: Lives with: Lives with someone.  Lives in: House/apartment Stairs: has ramp.   Has following equipment at home: FWW, cane.  Elevated toilet seat, railings in bathroom.     OCCUPATION: No work.   PLOF: Independent.  Sit and watch TV, computer.   Has riding mowing.   Doesn't drive.   Usually does groceries shopping.   PATIENT GOALS: Reduce pain, improve walking.   OBJECTIVE:   PATIENT SURVEYS:  Patient-Specific Activity Scoring Scheme  0 represents "unable to perform." 10 represents "able to perform at prior level. 0 1 2 3 4 5 6 7 8 9  10 (Date and Score)   Activity Eval  03/28/2024 Did not gather due to patient difficulty in writing.  Will ask next viist    1.      2.       3.     4.    5.    Score     Total score = sum of the activity scores/number of activities Minimum detectable change (90%CI) for average score = 2 points Minimum detectable change (90%CI) for single activity score = 3 points  COGNITION: 03/28/2024 Overall cognitive status: WFL    SENSATION: 03/28/2024 Not tested  EDEMA:  03/28/2024 Visual Lt leg edema from knee to ankle/lower leg.   MUSCLE LENGTH: 03/28/2024 No specific testing today  POSTURE:  03/28/2024 Mild forward lean in standing, walking.  Tremors noted in UE control.   PALPATION: 03/28/2024 Mild tenderness around incision and anterior knee.   LOWER EXTREMITY ROM:   ROM Right Eval 03/28/2024 Left Eval 03/28/2024  Hip flexion    Hip extension    Hip abduction    Hip adduction    Hip internal rotation    Hip external rotation    Knee flexion  110 AROM in supine heel slide  Knee extension  -10 seated LAQ AROM    Ankle dorsiflexion    Ankle plantarflexion    Ankle inversion    Ankle eversion     (Blank rows = not tested)  LOWER EXTREMITY  MMT:  MMT Right Eval 03/28/2024 Left Eval 03/28/2024  Hip flexion 5/5 4+/5  Hip extension    Hip abduction    Hip adduction    Hip internal rotation    Hip external rotation    Knee flexion 5/5 5/5  Knee extension 5/5 4/5  Ankle dorsiflexion 5/5 5/5  Ankle plantarflexion    Ankle inversion    Ankle eversion     (Blank rows = not tested)  LOWER EXTREMITY SPECIAL TESTS:  03/28/2024 No specific testing.   FUNCTIONAL TESTS:  03/28/2024 18 inch chair transfer: unable s UE assist.  Lt SLS: unable   TUG with rollator 17 seconds  GAIT: 03/28/2024 Ambulation in clinic distances with RWW with reduced gait speed, step length with Rt leg due to lack of stance and toe off progression on Lt leg.  Lacking TKE in stance.  TODAY'S TREATMENT                                                                          DATE:  03/31/2024 ***   TODAY'S TREATMENT                                                                          DATE:  03/28/2024 Therex:    HEP instruction/performance c cues for techniques, handout provided.  Trial set performed of each for comprehension and symptom assessment.  See below for exercise list Supine TKE 3 mins trial.   Manual Supine Lt knee flexion c distraction/IR mobilization c movement in elevation.   Self Care Education on edema reduction importance with emphasis on elevation, ice use, ankle pumps for reducing swelling within LE.   Education given during vaso time.   Vaso 10 mins medium compression in elevation Lt leg 34 deg   PATIENT EDUCATION:  Education details: HEP, POC Person educated: Patient Education method: Programmer, multimedia, Demonstration, Verbal cues, and Handouts Education comprehension: verbalized understanding, returned demonstration, and verbal cues required  HOME  EXERCISE PROGRAM: Access Code: [email or web address] URL: [email or web address] Date: 03/28/2024 Prepared by: Ozell Silvan  Exercises - Supine Heel Slide  - 3-5 x daily - 7 x weekly - 1 sets - 10 reps - 5 hold - Supine Heel Slide with Strap  - 3-5 x daily - 7 x weekly - 1 sets - 10 reps - 5 hold - Seated Long Arc Quad  - 3-5 x daily - 7 x weekly - 1 sets - 5-10 reps - 2 hold - Seated Quad Set (Mirrored)  - 3-5 x daily - 7 x weekly - 1 sets - 10 reps - 5 hold - Quad Setting and Stretching  - 3-5 x daily - 7 x weekly - 1 sets - 10 reps - 5 hold - Supine Knee Extension Mobilization with Weight (Mirrored)  - 4-5 x daily - 7 x weekly - 1 sets - 1 reps - to tolerance up to 5 mins hold  ASSESSMENT:  CLINICAL IMPRESSION: *** Patient is a 73 y.o. who comes to clinic with complaints of Lt knee pain s/p Lt TKA with mobility, strength and movement coordination deficits that impair their ability to perform usual daily and recreational functional activities without increase difficulty/symptoms at this time.  Patient to benefit from skilled PT services to address impairments and limitations to improve to previous level of function without restriction secondary to condition.   OBJECTIVE IMPAIRMENTS: Abnormal gait, decreased activity tolerance, decreased balance, decreased coordination, decreased endurance, decreased mobility, difficulty walking, decreased ROM, decreased strength, hypomobility, increased edema, increased fascial restrictions, impaired perceived functional ability, impaired flexibility, improper body mechanics, postural dysfunction, and pain.   ACTIVITY LIMITATIONS: carrying, lifting, bending, sitting, standing, squatting, sleeping, stairs, transfers, reach over head, and locomotion level  PARTICIPATION LIMITATIONS: meal prep, cleaning, laundry, interpersonal relationship, shopping, community activity, and yard work  PERSONAL FACTORS: Anemia, COPD, Depression, DVT  history, GERD, Hep C,  High blood pressure, High cholesterol, peripheral neuropathy. are also affecting patient's functional outcome.   REHAB POTENTIAL: Good  CLINICAL DECISION MAKING: Evolving/moderate complexity  EVALUATION COMPLEXITY: Moderate   GOALS: Goals reviewed with patient? Yes  SHORT TERM GOALS: (target date for Short term goals are 3 weeks 04/18/2024)   1.  Patient will demonstrate independent use of home exercise program to maintain progress from in clinic treatments.  Goal status: New  LONG TERM GOALS: (target dates for all long term goals are 10 weeks  06/06/2024 )   1. Patient will demonstrate/report pain at worst less than or equal to 2/10 to facilitate minimal limitation in daily activity secondary to pain symptoms.  Goal status: New   2. Patient will demonstrate independent use of home exercise program to facilitate ability to maintain/progress functional gains from skilled physical therapy services.  Goal status: New   3. Patient will demonstrate Patient specific functional scale avg > or = 8/10 to indicate reduced disability due to condition.   Goal status: New   4.  Patient will demonstrate Lt LE MMT 5/5 throughout to faciltiate usual transfers, stairs, squatting at [facility] for daily life.   Goal status: New   5.  Patient will demonstrate LRAD ambulation community distances > 500 ft.  Goal status: New   6.  Patient will demonstrate ascending/descending stairs reciprocally s UE assist for community integration, getting on /off mower.   Goal status: New   7.  Patient will demonstrate TUG with LRAD < 14 seconds to indicate reduced fall risk  Goal Status: New   PLAN:  PT FREQUENCY: 1-2x/week  PT DURATION: 10 weeks  PLANNED INTERVENTIONS: Can include 02853- PT Re-evaluation, 97110-Therapeutic exercises, 97530- Therapeutic activity, [id number]- Neuromuscular re-education, 97535- Self Care, 97140- Manual therapy, [phone]- Gait training, [phone]- Orthotic Fit/training, [phone]-  Canalith repositioning, [id number]- Aquatic Therapy, [phone]- Electrical stimulation (unattended), [id number] Physical performance testing, 97016- Vasopneumatic device, [id number]- Ultrasound, [id number]- Traction (mechanical), [id number]- Ionotophoresis 4mg /ml Dexamethasone ,  79439 - Needle insertion w/o injection 1 or 2 muscles, 20561 - Needle insertion w/o injection 3 or more muscles.   Patient/Family education, Balance training, Stair training, Taping, Dry Needling, Joint mobilization, Joint manipulation, Spinal manipulation, Spinal mobilization, Scar mobilization, Vestibular training, Visual/preceptual remediation/compensation, DME instructions, Cryotherapy, and Moist heat.  All performed as medically necessary.  All included unless contraindicated  PLAN FOR NEXT SESSION: *** Review HEP knowledge/results. Extension gains and strengthening.  Vaso.  PSFS completion (did not gather on eval).    Susannah Daring, PT, DPT 03/30/24 7:53 AM

## 2024-03-31 ENCOUNTER — Ambulatory Visit

## 2024-03-31 ENCOUNTER — Encounter: Payer: Self-pay | Admitting: Rehabilitative and Restorative Service Providers"

## 2024-03-31 DIAGNOSIS — R262 Difficulty in walking, not elsewhere classified: Secondary | ICD-10-CM

## 2024-03-31 DIAGNOSIS — M25662 Stiffness of left knee, not elsewhere classified: Secondary | ICD-10-CM | POA: Diagnosis not present

## 2024-03-31 DIAGNOSIS — M6281 Muscle weakness (generalized): Secondary | ICD-10-CM

## 2024-03-31 DIAGNOSIS — R600 Localized edema: Secondary | ICD-10-CM

## 2024-03-31 DIAGNOSIS — M25562 Pain in left knee: Secondary | ICD-10-CM

## 2024-03-31 DIAGNOSIS — G8929 Other chronic pain: Secondary | ICD-10-CM

## 2024-03-31 NOTE — Therapy (Signed)
 OUTPATIENT PHYSICAL THERAPY TREATMENT   Patient Name: Thomas Mcneil MRN: [id number] DOB:1950/09/16, 73 y.o., male Today's Date: 03/31/2024  END OF SESSION:  PT End of Session - 03/31/24 0939     Visit Number 2    Number of Visits 20    Date for Recertification  06/06/24    Authorization Type [employer] $30 copay    Progress Note Due on Visit 10    PT Start Time 0937    PT Stop Time 1022    PT Time Calculation (min) 45 min    Activity Tolerance Patient tolerated treatment well    Behavior During Therapy WFL for tasks assessed/performed           Past Medical History:  Diagnosis Date   Anemia    Arthritis    Complication of anesthesia    slow to wake up after last surgery   COPD (chronic obstructive pulmonary disease) (HCC)    Depression    DVT (deep venous thrombosis) (HCC)    GERD (gastroesophageal reflux disease)    Hepatitis    hep c   High blood pressure    High cholesterol    MGUS (monoclonal gammopathy of unknown significance)    Neuropathy    Peripheral neuropathy    feet and hands   Past Surgical History:  Procedure Laterality Date   DIRECT LARYNGOSCOPY  12/26/2011   Procedure: DIRECT LARYNGOSCOPY;  Surgeon: Merilee Kraft, MD;  Location: [facility] OR;  Service: ENT;  Laterality: N/A;  Directy Laryngoscopy with biopsy [id number]   DIRECT LARYNGOSCOPY N/A 10/21/2012   Procedure: DIRECT LARYNGOSCOPY;  Surgeon: Merilee Kraft, MD;  Location: [facility] OR;  Service: ENT;  Laterality: N/A;   ESOPHAGOGASTRODUODENOSCOPY N/A 10/25/2012   Procedure: ESOPHAGOGASTRODUODENOSCOPY (EGD);  Surgeon: Renaye Sous, MD;  Location: [facility] ENDOSCOPY;  Service: Endoscopy;  Laterality: N/A;   TONSILLECTOMY     TOTAL HIP ARTHROPLASTY Right 10/19/2023   Procedure: ARTHROPLASTY, HIP, TOTAL, ANTERIOR APPROACH;  Surgeon: Jerri Kay HERO, MD;  Location: [facility] OR;  Service: Orthopedics;  Laterality: Right;   TOTAL KNEE ARTHROPLASTY Left 03/04/2024   Procedure: ARTHROPLASTY, KNEE, TOTAL;  Surgeon: Jerri Kay HERO,  MD;  Location: [facility] ORS;  Service: Orthopedics;  Laterality: Left;   Patient Active Problem List   Diagnosis Date Noted   Status post total left knee replacement 03/04/2024   Primary osteoarthritis of left knee 02/12/2024   Status post total replacement of right hip 10/19/2023   Pressure injury of skin of left buttock 10/13/2023   Primary osteoarthritis of right hip 09/08/2023   Cocaine dependence, binge pattern (HCC) 11/26/2015   Substance induced mood disorder (HCC) 11/26/2015   Intention tremor 11/26/2015   Alcohol  use disorder, severe, dependence (HCC) 09/04/2015   Severe recurrent major depression without psychotic features (HCC) 09/04/2015   Alcohol  dependence, uncomplicated (HCC) 09/01/2015   Alcohol -induced mood disorder (HCC) 09/01/2015   Cocaine abuse (HCC) 09/01/2015   Alcohol  abuse 04/05/2014   Essential tremor 01/04/2014   Other and unspecified hyperlipidemia 01/04/2014   B12 deficiency 01/04/2014   Melena 10/23/2012   GERD 05/27/2010   SHOULDER PAIN, LEFT 05/21/2010   ONYCHOMYCOSIS, TOENAILS 05/09/2010   KNEE PAIN, BILATERAL 06/27/2009   LEG CRAMPS 06/27/2009   Contusion 08/18/2008   LEG EDEMA, BILATERAL 01/13/2008   ERECTILE DYSFUNCTION 10/15/2007   TOBACCO ABUSE 08/16/2007   Mononeuritis of lower limb 08/16/2007   CORNS AND CALLUSES 08/16/2007   ALCOHOL  ABUSE, HX OF 08/16/2007    PCP: Sigrid Deidra Fox MD  REFERRING  PROVIDER: Jule Ronal CROME, PA-C  REFERRING DIAG: [phone] (ICD-10-CM) - Status post total left knee replacement  THERAPY DIAG:  Chronic pain of left knee  Muscle weakness (generalized)  Stiffness of left knee  Difficulty in walking, not elsewhere classified  Localized edema  Rationale for Evaluation and Treatment: Rehabilitation  ONSET DATE: Lt TKA 03/04/2024  SUBJECTIVE:   SUBJECTIVE STATEMENT: Pt indicated about 6/10 upon arrival today for knee.    PERTINENT HISTORY: Anemia, COPD, Depression, DVT history, GERD, Hep C, High  blood pressure, High cholesterol, peripheral neuropathy.   PAIN:  NPRS scale: 6/10 upon arrival.  Pain location: Lt knee  Pain description: stiffness, achy.  Sharp pains at times.  Aggravating factors: prolonged walking,standing, nighttime.  Stiffness with sitting.  Relieving factors: Ice machine. Tylenol    PRECAUTIONS: None  WEIGHT BEARING RESTRICTIONS: No  FALLS:  Has patient fallen in last 6 months? No  LIVING ENVIRONMENT: Lives with: Lives with someone.  Lives in: House/apartment Stairs: has ramp.   Has following equipment at home: FWW, cane.  Elevated toilet seat, railings in bathroom.     OCCUPATION: No work.   PLOF: Independent.  Sit and watch TV, computer.   Has riding mowing.   Doesn't drive.   Usually does groceries shopping.   PATIENT GOALS: Reduce pain, improve walking.   OBJECTIVE:   PATIENT SURVEYS:  Patient-Specific Activity Scoring Scheme  0 represents "unable to perform." 10 represents "able to perform at prior level. 0 1 2 3 4 5 6 7 8 9  10 (Date and Score)   Activity Eval  03/28/2024 Did not gather due to patient difficulty in writing.  Will ask next viist 03/31/2024    Normal walking    6  2. Mowing yard   0   3.  Standing in house unassisted  6  4. Transfers (car)  3  5.    Score  3.75   Total score = sum of the activity scores/number of activities Minimum detectable change (90%CI) for average score = 2 points Minimum detectable change (90%CI) for single activity score = 3 points  COGNITION: 03/28/2024 Overall cognitive status: WFL    SENSATION: 03/28/2024 Not tested  EDEMA:  03/28/2024 Visual Lt leg edema from knee to ankle/lower leg.   MUSCLE LENGTH: 03/28/2024 No specific testing today  POSTURE:  03/28/2024 Mild forward lean in standing, walking.  Tremors noted in UE control.   PALPATION: 03/28/2024 Mild tenderness around incision and anterior knee.   LOWER EXTREMITY ROM:   ROM Right Eval 03/28/2024  Left Eval 03/28/2024  Hip flexion    Hip extension    Hip abduction    Hip adduction    Hip internal rotation    Hip external rotation    Knee flexion  110 AROM in supine heel slide  Knee extension  -10 seated LAQ AROM    Ankle dorsiflexion    Ankle plantarflexion    Ankle inversion    Ankle eversion     (Blank rows = not tested)  LOWER EXTREMITY MMT:  MMT Right Eval 03/28/2024 Left Eval 03/28/2024  Hip flexion 5/5 4+/5  Hip extension    Hip abduction    Hip adduction    Hip internal rotation    Hip external rotation    Knee flexion 5/5 5/5  Knee extension 5/5 4/5  Ankle dorsiflexion 5/5 5/5  Ankle plantarflexion    Ankle inversion    Ankle eversion     (Blank rows = not tested)  LOWER EXTREMITY  SPECIAL TESTS:  03/28/2024 No specific testing.   FUNCTIONAL TESTS:  03/28/2024 18 inch chair transfer: unable s UE assist.  Lt SLS: unable   TUG with rollator 17 seconds  GAIT: 03/28/2024 Ambulation in clinic distances with RWW with reduced gait speed, step length with Rt leg due to lack of stance and toe off progression on Lt leg.  Lacking TKE in stance.                                                                                                                                                                         TODAY'S TREATMENT                                                                          DATE:  03/31/2024 Therex: Seated Lt leg LAQ 2 lb with end range pauses each direction 2 x 10  Seated hamstring curl Lt leg green band 2 x 10  Supine Lt knee SAQ 3 x 10 2-3 sec hold  Supine heel  slide AROM Lt leg 5 sec hold x 15  Supine TKE stretch to start vaso for extension gains. Performed to tolerance 4 mins   Manual Supine posterior Femur glides on tibia with bolster under proximal tibia g4 for extension gains.   Neuro Re-ed (muscle activation, balance control) Standing feet together stance with SBA 1 min Standing feet hip width apart church pew  sway control fwd/back x 20 each way with SBA Supine quad set 5 sec hold x 10    Vaso 10 mins medium compression in elevation Lt leg 34 deg  with TKE stretch start of activity.   TODAY'S TREATMENT                                                                          DATE:  03/28/2024 Therex:    HEP instruction/performance c cues for techniques, handout provided.  Trial set performed of each for comprehension and symptom assessment.  See below for exercise list Supine TKE 3 mins trial.   Manual Supine Lt knee flexion c distraction/IR mobilization c movement in elevation.   Self Care Education on edema reduction importance with emphasis on elevation,  ice use, ankle pumps for reducing swelling within LE.   Education given during vaso time.   Vaso 10 mins medium compression in elevation Lt leg 34 deg   PATIENT EDUCATION:  Education details: HEP, POC Person educated: Patient Education method: Programmer, multimedia, Demonstration, Verbal cues, and Handouts Education comprehension: verbalized understanding, returned demonstration, and verbal cues required  HOME EXERCISE PROGRAM: Access Code: [email or web address] URL: [email or web address] Date: 03/28/2024 Prepared by: Ozell Silvan  Exercises - Supine Heel Slide  - 3-5 x daily - 7 x weekly - 1 sets - 10 reps - 5 hold - Supine Heel Slide with Strap  - 3-5 x daily - 7 x weekly - 1 sets - 10 reps - 5 hold - Seated Long Arc Quad  - 3-5 x daily - 7 x weekly - 1 sets - 5-10 reps - 2 hold - Seated Quad Set (Mirrored)  - 3-5 x daily - 7 x weekly - 1 sets - 10 reps - 5 hold - Quad Setting and Stretching  - 3-5 x daily - 7 x weekly - 1 sets - 10 reps - 5 hold - Supine Knee Extension Mobilization with Weight (Mirrored)  - 4-5 x daily - 7 x weekly - 1 sets - 1 reps - to tolerance up to 5 mins hold  ASSESSMENT:  CLINICAL IMPRESSION: Making some gains in quality of extension.  Will continue to benefit from skilled PT services to progress mobility,  strength and balance control to reach goals.   OBJECTIVE IMPAIRMENTS: Abnormal gait, decreased activity tolerance, decreased balance, decreased coordination, decreased endurance, decreased mobility, difficulty walking, decreased ROM, decreased strength, hypomobility, increased edema, increased fascial restrictions, impaired perceived functional ability, impaired flexibility, improper body mechanics, postural dysfunction, and pain.   ACTIVITY LIMITATIONS: carrying, lifting, bending, sitting, standing, squatting, sleeping, stairs, transfers, reach over head, and locomotion level  PARTICIPATION LIMITATIONS: meal prep, cleaning, laundry, interpersonal relationship, shopping, community activity, and yard work  PERSONAL FACTORS: Anemia, COPD, Depression, DVT history, GERD, Hep C, High blood pressure, High cholesterol, peripheral neuropathy. are also affecting patient's functional outcome.   REHAB POTENTIAL: Good  CLINICAL DECISION MAKING: Evolving/moderate complexity  EVALUATION COMPLEXITY: Moderate   GOALS: Goals reviewed with patient? Yes  SHORT TERM GOALS: (target date for Short term goals are 3 weeks 04/18/2024)   1.  Patient will demonstrate independent use of home exercise program to maintain progress from in clinic treatments.  Goal status: on going 03/31/2024  LONG TERM GOALS: (target dates for all long term goals are 10 weeks  06/06/2024 )   1. Patient will demonstrate/report pain at worst less than or equal to 2/10 to facilitate minimal limitation in daily activity secondary to pain symptoms.  Goal status: New   2. Patient will demonstrate independent use of home exercise program to facilitate ability to maintain/progress functional gains from skilled physical therapy services.  Goal status: New   3. Patient will demonstrate Patient specific functional scale avg > or = 8/10 to indicate reduced disability due to condition.   Goal status: New   4.  Patient will demonstrate  Lt LE MMT 5/5 throughout to faciltiate usual transfers, stairs, squatting at [facility] for daily life.   Goal status: New   5.  Patient will demonstrate LRAD ambulation community distances > 500 ft.  Goal status: New   6.  Patient will demonstrate ascending/descending stairs reciprocally s UE assist for community integration, getting on /off mower.   Goal status: New   7.  Patient will demonstrate TUG with  LRAD < 14 seconds to indicate reduced fall risk  Goal Status: New   PLAN:  PT FREQUENCY: 1-2x/week  PT DURATION: 10 weeks  PLANNED INTERVENTIONS: Can include 02853- PT Re-evaluation, 97110-Therapeutic exercises, 97530- Therapeutic activity, [id number]- Neuromuscular re-education, 97535- Self Care, 97140- Manual therapy, [phone]- Gait training, [phone]- Orthotic Fit/training, [phone]- Canalith repositioning, [id number]- Aquatic Therapy, [phone]- Electrical stimulation (unattended), [id number] Physical performance testing, 97016- Vasopneumatic device, [id number]- Ultrasound, [id number]- Traction (mechanical), [id number]- Ionotophoresis 4mg /ml Dexamethasone ,  [id number] - Needle insertion w/o injection 1 or 2 muscles, 20561 - Needle insertion w/o injection 3 or more muscles.   Patient/Family education, Balance training, Stair training, Taping, Dry Needling, Joint mobilization, Joint manipulation, Spinal manipulation, Spinal mobilization, Scar mobilization, Vestibular training, Visual/preceptual remediation/compensation, DME instructions, Cryotherapy, and Moist heat.  All performed as medically necessary.  All included unless contraindicated  PLAN FOR NEXT SESSION: Progressive strengthening, quad activation.  Balance improvements and extension gains.    Ozell Silvan, PT, DPT, OCS, ATC 03/31/24  10:11 AM

## 2024-04-04 ENCOUNTER — Encounter: Admitting: Physical Therapy

## 2024-04-04 NOTE — Therapy (Incomplete)
 OUTPATIENT PHYSICAL THERAPY TREATMENT   Patient Name: Thomas Mcneil MRN: [id number] DOB:05/08/1951, 73 y.o., male Today's Date: 04/04/2024  END OF SESSION:     Past Medical History:  Diagnosis Date   Anemia    Arthritis    Complication of anesthesia    slow to wake up after last surgery   COPD (chronic obstructive pulmonary disease) (HCC)    Depression    DVT (deep venous thrombosis) (HCC)    GERD (gastroesophageal reflux disease)    Hepatitis    hep c   High blood pressure    High cholesterol    MGUS (monoclonal gammopathy of unknown significance)    Neuropathy    Peripheral neuropathy    feet and hands   Past Surgical History:  Procedure Laterality Date   DIRECT LARYNGOSCOPY  12/26/2011   Procedure: DIRECT LARYNGOSCOPY;  Surgeon: Merilee Kraft, MD;  Location: [facility] OR;  Service: ENT;  Laterality: N/A;  Directy Laryngoscopy with biopsy [id number]   DIRECT LARYNGOSCOPY N/A 10/21/2012   Procedure: DIRECT LARYNGOSCOPY;  Surgeon: Merilee Kraft, MD;  Location: [facility] OR;  Service: ENT;  Laterality: N/A;   ESOPHAGOGASTRODUODENOSCOPY N/A 10/25/2012   Procedure: ESOPHAGOGASTRODUODENOSCOPY (EGD);  Surgeon: Renaye Sous, MD;  Location: [facility] ENDOSCOPY;  Service: Endoscopy;  Laterality: N/A;   TONSILLECTOMY     TOTAL HIP ARTHROPLASTY Right 10/19/2023   Procedure: ARTHROPLASTY, HIP, TOTAL, ANTERIOR APPROACH;  Surgeon: Jerri Kay HERO, MD;  Location: [facility] OR;  Service: Orthopedics;  Laterality: Right;   TOTAL KNEE ARTHROPLASTY Left 03/04/2024   Procedure: ARTHROPLASTY, KNEE, TOTAL;  Surgeon: Jerri Kay HERO, MD;  Location: [facility] ORS;  Service: Orthopedics;  Laterality: Left;   Patient Active Problem List   Diagnosis Date Noted   Status post total left knee replacement 03/04/2024   Primary osteoarthritis of left knee 02/12/2024   Status post total replacement of right hip 10/19/2023   Pressure injury of skin of left buttock 10/13/2023   Primary osteoarthritis of right hip 09/08/2023   Cocaine dependence,  binge pattern (HCC) 11/26/2015   Substance induced mood disorder (HCC) 11/26/2015   Intention tremor 11/26/2015   Alcohol  use disorder, severe, dependence (HCC) 09/04/2015   Severe recurrent major depression without psychotic features (HCC) 09/04/2015   Alcohol  dependence, uncomplicated (HCC) 09/01/2015   Alcohol -induced mood disorder (HCC) 09/01/2015   Cocaine abuse (HCC) 09/01/2015   Alcohol  abuse 04/05/2014   Essential tremor 01/04/2014   Other and unspecified hyperlipidemia 01/04/2014   B12 deficiency 01/04/2014   Melena 10/23/2012   GERD 05/27/2010   SHOULDER PAIN, LEFT 05/21/2010   ONYCHOMYCOSIS, TOENAILS 05/09/2010   KNEE PAIN, BILATERAL 06/27/2009   LEG CRAMPS 06/27/2009   Contusion 08/18/2008   LEG EDEMA, BILATERAL 01/13/2008   ERECTILE DYSFUNCTION 10/15/2007   TOBACCO ABUSE 08/16/2007   Mononeuritis of lower limb 08/16/2007   CORNS AND CALLUSES 08/16/2007   ALCOHOL  ABUSE, HX OF 08/16/2007    PCP: Sigrid Deidra Fox MD  REFERRING PROVIDER: Jule Ronal CROME, PA-C  REFERRING DIAG: [id number] (ICD-10-CM) - Status post total left knee replacement  THERAPY DIAG:  No diagnosis found.  Rationale for Evaluation and Treatment: Rehabilitation  ONSET DATE: Lt TKA 03/04/2024  SUBJECTIVE:   SUBJECTIVE STATEMENT: ***  Pt indicated about 6/10 upon arrival today for knee.    PERTINENT HISTORY: Anemia, COPD, Depression, DVT history, GERD, Hep C, High blood pressure, High cholesterol, peripheral neuropathy.   PAIN:  NPRS scale: *** 6/10 upon arrival.  Pain location: Lt knee  Pain description: stiffness, achy.  [name]  pains at times.  Aggravating factors: prolonged walking,standing, nighttime.  Stiffness with sitting.  Relieving factors: Ice machine. Tylenol    PRECAUTIONS: None  WEIGHT BEARING RESTRICTIONS: No  FALLS:  Has patient fallen in last 6 months? No  LIVING ENVIRONMENT: Lives with: Lives with someone.  Lives in: House/apartment Stairs: has ramp.   Has  following equipment at home: FWW, cane.  Elevated toilet seat, railings in bathroom.     OCCUPATION: No work.   PLOF: Independent.  Sit and watch TV, computer.   Has riding mowing.   Doesn't drive.   Usually does groceries shopping.   PATIENT GOALS: Reduce pain, improve walking.   OBJECTIVE:   PATIENT SURVEYS:  Patient-Specific Activity Scoring Scheme  0 represents "unable to perform." 10 represents "able to perform at prior level. 0 1 2 3 4 5 6 7 8 9  10 (Date and Score)   Activity Eval  03/28/2024 Did not gather due to patient difficulty in writing.  Will ask next viist 03/31/2024    Normal walking    6  2. Mowing yard   0   3.  Standing in house unassisted  6  4. Transfers (car)  3  5.    Score  3.75   Total score = sum of the activity scores/number of activities Minimum detectable change (90%CI) for average score = 2 points Minimum detectable change (90%CI) for single activity score = 3 points  COGNITION: 03/28/2024 Overall cognitive status: WFL    SENSATION: 03/28/2024 Not tested  EDEMA:  03/28/2024 Visual Lt leg edema from knee to ankle/lower leg.   MUSCLE LENGTH: 03/28/2024 No specific testing today  POSTURE:  03/28/2024 Mild forward lean in standing, walking.  Tremors noted in UE control.   PALPATION: 03/28/2024 Mild tenderness around incision and anterior knee.   LOWER EXTREMITY ROM:   ROM Right Eval 03/28/2024 Left Eval 03/28/2024  Hip flexion    Hip extension    Hip abduction    Hip adduction    Hip internal rotation    Hip external rotation    Knee flexion  110 AROM in supine heel slide  Knee extension  -10 seated LAQ AROM    Ankle dorsiflexion    Ankle plantarflexion    Ankle inversion    Ankle eversion     (Blank rows = not tested)  LOWER EXTREMITY MMT:  MMT Right Eval 03/28/2024 Left Eval 03/28/2024  Hip flexion 5/5 4+/5  Hip extension    Hip abduction    Hip adduction    Hip internal rotation    Hip external  rotation    Knee flexion 5/5 5/5  Knee extension 5/5 4/5  Ankle dorsiflexion 5/5 5/5  Ankle plantarflexion    Ankle inversion    Ankle eversion     (Blank rows = not tested)  LOWER EXTREMITY SPECIAL TESTS:  03/28/2024 No specific testing.   FUNCTIONAL TESTS:  03/28/2024 18 inch chair transfer: unable s UE assist.  Lt SLS: unable   TUG with rollator 17 seconds  GAIT: 03/28/2024 Ambulation in clinic distances with RWW with reduced gait speed, step length with Rt leg due to lack of stance and toe off progression on Lt leg.  Lacking TKE in stance.  TODAY'S TREATMENT                                                                          DATE:  04/04/2024 Therapeutic Exercise: ***  Seated Lt leg LAQ 2 lb with end range pauses each direction 2 x 10  Seated hamstring curl Lt leg green band 2 x 10  Supine Lt knee SAQ 3 x 10 2-3 sec hold  Supine heel  slide AROM Lt leg 5 sec hold x 15  Supine TKE stretch to start vaso for extension gains. Performed to tolerance 4 mins   Manual Supine posterior Femur glides on tibia with bolster under proximal tibia g4 for extension gains.   Neuro Re-ed (muscle activation, balance control) Standing feet together stance with SBA 1 min Standing feet hip width apart church pew sway control fwd/back x 20 each way with SBA Supine quad set 5 sec hold x 10   Vaso 10 mins medium compression in elevation Lt leg 34 deg  with TKE stretch start of activity.     TREATMENT                                                                          DATE:  03/31/2024 Therex: Seated Lt leg LAQ 2 lb with end range pauses each direction 2 x 10  Seated hamstring curl Lt leg green band 2 x 10  Supine Lt knee SAQ 3 x 10 2-3 sec hold  Supine heel  slide AROM Lt leg 5 sec hold x 15  Supine TKE stretch  to start vaso for extension gains. Performed to tolerance 4 mins   Manual Supine posterior Femur glides on tibia with bolster under proximal tibia g4 for extension gains.   Neuro Re-ed (muscle activation, balance control) Standing feet together stance with SBA 1 min Standing feet hip width apart church pew sway control fwd/back x 20 each way with SBA Supine quad set 5 sec hold x 10    Vaso 10 mins medium compression in elevation Lt leg 34 deg  with TKE stretch start of activity.    TREATMENT                                                                          DATE:  03/28/2024 Therex:    HEP instruction/performance c cues for techniques, handout provided.  Trial set performed of each for comprehension and symptom assessment.  See below for exercise list Supine TKE 3 mins trial.   Manual Supine Lt knee flexion c distraction/IR mobilization c movement in elevation.   Self Care Education on edema reduction importance with emphasis on elevation, ice use, ankle pumps for reducing swelling  within LE.   Education given during vaso time.   Vaso 10 mins medium compression in elevation Lt leg 34 deg    PATIENT EDUCATION:  Education details: HEP, POC Person educated: Patient Education method: Programmer, Multimedia, Demonstration, Verbal cues, and Handouts Education comprehension: verbalized understanding, returned demonstration, and verbal cues required  HOME EXERCISE PROGRAM: Access Code: [email or web address] URL: [email or web address] Date: 03/28/2024 Prepared by: Ozell Silvan  Exercises - Supine Heel Slide  - 3-5 x daily - 7 x weekly - 1 sets - 10 reps - 5 hold - Supine Heel Slide with Strap  - 3-5 x daily - 7 x weekly - 1 sets - 10 reps - 5 hold - Seated Long Arc Quad  - 3-5 x daily - 7 x weekly - 1 sets - 5-10 reps - 2 hold - Seated Quad Set (Mirrored)  - 3-5 x daily - 7 x weekly - 1 sets - 10 reps - 5 hold - Quad Setting and Stretching  - 3-5 x daily - 7 x weekly - 1 sets - 10  reps - 5 hold - Supine Knee Extension Mobilization with Weight (Mirrored)  - 4-5 x daily - 7 x weekly - 1 sets - 1 reps - to tolerance up to 5 mins hold   ASSESSMENT:  CLINICAL IMPRESSION: ***   Making some gains in quality of extension.  Will continue to benefit from skilled PT services to progress mobility, strength and balance control to reach goals.   OBJECTIVE IMPAIRMENTS: Abnormal gait, decreased activity tolerance, decreased balance, decreased coordination, decreased endurance, decreased mobility, difficulty walking, decreased ROM, decreased strength, hypomobility, increased edema, increased fascial restrictions, impaired perceived functional ability, impaired flexibility, improper body mechanics, postural dysfunction, and pain.   ACTIVITY LIMITATIONS: carrying, lifting, bending, sitting, standing, squatting, sleeping, stairs, transfers, reach over head, and locomotion level  PARTICIPATION LIMITATIONS: meal prep, cleaning, laundry, interpersonal relationship, shopping, community activity, and yard work  PERSONAL FACTORS: Anemia, COPD, Depression, DVT history, GERD, Hep C, High blood pressure, High cholesterol, peripheral neuropathy. are also affecting patient's functional outcome.   REHAB POTENTIAL: Good  CLINICAL DECISION MAKING: Evolving/moderate complexity  EVALUATION COMPLEXITY: Moderate   GOALS: Goals reviewed with patient? Yes  SHORT TERM GOALS: (target date for Short term goals are 3 weeks 04/18/2024)   1.  Patient will demonstrate independent use of home exercise program to maintain progress from in clinic treatments. Goal status: Ongoing   04/04/2024  LONG TERM GOALS: (target dates for all long term goals are 10 weeks  06/06/2024 )   1. Patient will demonstrate/report pain at worst less than or equal to 2/10 to facilitate minimal limitation in daily activity secondary to pain symptoms. Goal status: Ongoing   04/04/2024   2. Patient will demonstrate independent use  of home exercise program to facilitate ability to maintain/progress functional gains from skilled physical therapy services. Goal status: Ongoing   04/04/2024   3. Patient will demonstrate Patient specific functional scale avg > or = 8/10 to indicate reduced disability due to condition.  Goal status: Ongoing   04/04/2024   4.  Patient will demonstrate Lt LE MMT 5/5 throughout to faciltiate usual transfers, stairs, squatting at [facility] for daily life.  Goal status: Ongoing   04/04/2024   5.  Patient will demonstrate LRAD ambulation community distances > 500 ft.  Goal status: Ongoing   04/04/2024   6.  Patient will demonstrate ascending/descending stairs reciprocally s UE assist for community integration, getting on /off mower.  Goal status: Ongoing  04/04/2024   7.  Patient will demonstrate TUG with LRAD < 14 seconds to indicate reduced fall risk Goal Status: Ongoing   04/04/2024   PLAN:  PT FREQUENCY: 1-2x/week  PT DURATION: 10 weeks  PLANNED INTERVENTIONS: Can include 02853- PT Re-evaluation, 97110-Therapeutic exercises, 97530- Therapeutic activity, 97112- Neuromuscular re-education, 97535- Self Care, 97140- Manual therapy, [phone]- Gait training, [phone]- Orthotic Fit/training, [phone]- Canalith repositioning, [id number]- Aquatic Therapy, [phone]- Electrical stimulation (unattended), [id number] Physical performance testing, 97016- Vasopneumatic device, [id number]- Ultrasound, [id number]- Traction (mechanical), [id number]- Ionotophoresis 4mg /ml Dexamethasone ,  [id number] - Needle insertion w/o injection 1 or 2 muscles, 20561 - Needle insertion w/o injection 3 or more muscles.   Patient/Family education, Balance training, Stair training, Taping, Dry Needling, Joint mobilization, Joint manipulation, Spinal manipulation, Spinal mobilization, Scar mobilization, Vestibular training, Visual/preceptual remediation/compensation, DME instructions, Cryotherapy, and Moist heat.  All performed as medically necessary.  All included unless  contraindicated  PLAN FOR NEXT SESSION: ***   Progressive strengthening, quad activation.  Balance improvements and extension gains.    Grayce Spatz, PT, DPT 04/04/2024, 7:40 AM

## 2024-04-08 ENCOUNTER — Encounter: Payer: Self-pay | Admitting: Rehabilitative and Restorative Service Providers"

## 2024-04-08 ENCOUNTER — Ambulatory Visit: Admitting: Rehabilitative and Restorative Service Providers"

## 2024-04-08 DIAGNOSIS — G8929 Other chronic pain: Secondary | ICD-10-CM

## 2024-04-08 DIAGNOSIS — M6281 Muscle weakness (generalized): Secondary | ICD-10-CM | POA: Diagnosis not present

## 2024-04-08 DIAGNOSIS — R600 Localized edema: Secondary | ICD-10-CM

## 2024-04-08 DIAGNOSIS — M25562 Pain in left knee: Secondary | ICD-10-CM

## 2024-04-08 DIAGNOSIS — M25662 Stiffness of left knee, not elsewhere classified: Secondary | ICD-10-CM | POA: Diagnosis not present

## 2024-04-08 DIAGNOSIS — R262 Difficulty in walking, not elsewhere classified: Secondary | ICD-10-CM | POA: Diagnosis not present

## 2024-04-08 NOTE — Therapy (Signed)
 OUTPATIENT PHYSICAL THERAPY TREATMENT   Patient Name: Thomas Mcneil MRN: [id number] DOB:09-09-1950, 73 y.o., male Today's Date: 04/08/2024  END OF SESSION:  PT End of Session - 04/08/24 1156     Visit Number 3    Number of Visits 20    Date for Recertification  06/06/24    Authorization Type [employer] $30 copay    Progress Note Due on Visit 10    PT Start Time 1143    PT Stop Time 1222    PT Time Calculation (min) 39 min    Activity Tolerance Patient tolerated treatment well    Behavior During Therapy WFL for tasks assessed/performed            Past Medical History:  Diagnosis Date   Anemia    Arthritis    Complication of anesthesia    slow to wake up after last surgery   COPD (chronic obstructive pulmonary disease) (HCC)    Depression    DVT (deep venous thrombosis) (HCC)    GERD (gastroesophageal reflux disease)    Hepatitis    hep c   High blood pressure    High cholesterol    MGUS (monoclonal gammopathy of unknown significance)    Neuropathy    Peripheral neuropathy    feet and hands   Past Surgical History:  Procedure Laterality Date   DIRECT LARYNGOSCOPY  12/26/2011   Procedure: DIRECT LARYNGOSCOPY;  Surgeon: Merilee Kraft, MD;  Location: [facility] OR;  Service: ENT;  Laterality: N/A;  Directy Laryngoscopy with biopsy [id number]   DIRECT LARYNGOSCOPY N/A 10/21/2012   Procedure: DIRECT LARYNGOSCOPY;  Surgeon: Merilee Kraft, MD;  Location: [facility] OR;  Service: ENT;  Laterality: N/A;   ESOPHAGOGASTRODUODENOSCOPY N/A 10/25/2012   Procedure: ESOPHAGOGASTRODUODENOSCOPY (EGD);  Surgeon: Renaye Sous, MD;  Location: [facility] ENDOSCOPY;  Service: Endoscopy;  Laterality: N/A;   TONSILLECTOMY     TOTAL HIP ARTHROPLASTY Right 10/19/2023   Procedure: ARTHROPLASTY, HIP, TOTAL, ANTERIOR APPROACH;  Surgeon: Jerri Kay HERO, MD;  Location: [facility] OR;  Service: Orthopedics;  Laterality: Right;   TOTAL KNEE ARTHROPLASTY Left 03/04/2024   Procedure: ARTHROPLASTY, KNEE, TOTAL;  Surgeon: Jerri Kay HERO,  MD;  Location: [facility] ORS;  Service: Orthopedics;  Laterality: Left;   Patient Active Problem List   Diagnosis Date Noted   Status post total left knee replacement 03/04/2024   Primary osteoarthritis of left knee 02/12/2024   Status post total replacement of right hip 10/19/2023   Pressure injury of skin of left buttock 10/13/2023   Primary osteoarthritis of right hip 09/08/2023   Cocaine dependence, binge pattern (HCC) 11/26/2015   Substance induced mood disorder (HCC) 11/26/2015   Intention tremor 11/26/2015   Alcohol  use disorder, severe, dependence (HCC) 09/04/2015   Severe recurrent major depression without psychotic features (HCC) 09/04/2015   Alcohol  dependence, uncomplicated (HCC) 09/01/2015   Alcohol -induced mood disorder (HCC) 09/01/2015   Cocaine abuse (HCC) 09/01/2015   Alcohol  abuse 04/05/2014   Essential tremor 01/04/2014   Other and unspecified hyperlipidemia 01/04/2014   B12 deficiency 01/04/2014   Melena 10/23/2012   GERD 05/27/2010   SHOULDER PAIN, LEFT 05/21/2010   ONYCHOMYCOSIS, TOENAILS 05/09/2010   KNEE PAIN, BILATERAL 06/27/2009   LEG CRAMPS 06/27/2009   Contusion 08/18/2008   LEG EDEMA, BILATERAL 01/13/2008   ERECTILE DYSFUNCTION 10/15/2007   TOBACCO ABUSE 08/16/2007   Mononeuritis of lower limb 08/16/2007   CORNS AND CALLUSES 08/16/2007   ALCOHOL  ABUSE, HX OF 08/16/2007    PCP: Sigrid Deidra Fox MD  REFERRING PROVIDER: Jule Ronal LITTIE DEVONNA  REFERRING DIAG: [phone] (ICD-10-CM) - Status post total left knee replacement  THERAPY DIAG:  Chronic pain of left knee  Muscle weakness (generalized)  Stiffness of left knee  Difficulty in walking, not elsewhere classified  Localized edema  Rationale for Evaluation and Treatment: Rehabilitation  ONSET DATE: Lt TKA 03/04/2024  SUBJECTIVE:   SUBJECTIVE STATEMENT: Pt indicated doing ok today.  Reported sick earlier this week but doing better.    PERTINENT HISTORY: Anemia, COPD, Depression, DVT  history, GERD, Hep C, High blood pressure, High cholesterol, peripheral neuropathy.   PAIN:  NPRS scale: 0/10, 1/10 at worst in last day or so.  Pain location: Lt knee  Pain description: stiffness, achy.  Sharp pains at times.  Aggravating factors: prolonged walking,standing, nighttime.  Stiffness with sitting.  Relieving factors: Ice machine. Tylenol    PRECAUTIONS: None  WEIGHT BEARING RESTRICTIONS: No  FALLS:  Has patient fallen in last 6 months? No  LIVING ENVIRONMENT: Lives with: Lives with someone.  Lives in: House/apartment Stairs: has ramp.   Has following equipment at home: FWW, cane.  Elevated toilet seat, railings in bathroom.     OCCUPATION: No work.   PLOF: Independent.  Sit and watch TV, computer.   Has riding mowing.   Doesn't drive.   Usually does groceries shopping.   PATIENT GOALS: Reduce pain, improve walking.   OBJECTIVE:   PATIENT SURVEYS:  Patient-Specific Activity Scoring Scheme  0 represents "unable to perform." 10 represents "able to perform at prior level. 0 1 2 3 4 5 6 7 8 9  10 (Date and Score)   Activity Eval  03/28/2024 Did not gather due to patient difficulty in writing.  Will ask next viist 03/31/2024    Normal walking    6  2. Mowing yard   0   3.  Standing in house unassisted  6  4. Transfers (car)  3  5.    Score  3.75   Total score = sum of the activity scores/number of activities Minimum detectable change (90%CI) for average score = 2 points Minimum detectable change (90%CI) for single activity score = 3 points  COGNITION: 03/28/2024 Overall cognitive status: WFL    SENSATION: 03/28/2024 Not tested  EDEMA:  03/28/2024 Visual Lt leg edema from knee to ankle/lower leg.   MUSCLE LENGTH: 03/28/2024 No specific testing today  POSTURE:  03/28/2024 Mild forward lean in standing, walking.  Tremors noted in UE control.   PALPATION: 03/28/2024 Mild tenderness around incision and anterior knee.   LOWER EXTREMITY  ROM:   ROM Right Eval 03/28/2024 Left Eval 03/28/2024 Left 04/08/2024  Hip flexion     Hip extension     Hip abduction     Hip adduction     Hip internal rotation     Hip external rotation     Knee flexion  110 AROM in supine heel slide AROM in supine heel slide 120  Knee extension  -10 seated LAQ AROM   -5 in seated LAQ AROM  Ankle dorsiflexion     Ankle plantarflexion     Ankle inversion     Ankle eversion      (Blank rows = not tested)  LOWER EXTREMITY MMT:  MMT Right Eval 03/28/2024 Left Eval 03/28/2024  Hip flexion 5/5 4+/5  Hip extension    Hip abduction    Hip adduction    Hip internal rotation    Hip external rotation    Knee flexion 5/5 5/5  Knee extension 5/5 4/5  Ankle dorsiflexion 5/5 5/5  Ankle plantarflexion    Ankle inversion    Ankle eversion     (Blank rows = not tested)  LOWER EXTREMITY SPECIAL TESTS:  03/28/2024 No specific testing.   FUNCTIONAL TESTS:  03/28/2024 18 inch chair transfer: unable s UE assist.  Lt SLS: unable   TUG with rollator 17 seconds  GAIT: 03/28/2024 Ambulation in clinic distances with RWW with reduced gait speed, step length with Rt leg due to lack of stance and toe off progression on Lt leg.  Lacking TKE in stance.                                                                                                                                                                         TODAY'S TREATMENT                                                                          DATE:  04/08/2024 Therex: Nustep lvl 5 6.5 mins UE/LE for knee ROM.  Incline gastroc stretch 30 sec x 3 bilaterally in standing  Seated LAQ Lt leg 4 lb 2 x 15 Supine Lt leg SAQ 3 sec hold 2 x 10  Supine Lt leg heel slide 5 sec hold x 5    TherActivity (to improve stairs, standing, walking, transfers) Leg press double leg 62 lbs x 15 slow lowering focus  Leg press single leg 31 lbs x 15 slow lowering focus, performed bilaterally     TODAY'S TREATMENT                                                                          DATE:  03/31/2024 Therex: Seated Lt leg LAQ 2 lb with end range pauses each direction 2 x 10  Seated hamstring curl Lt leg green band 2 x 10  Supine Lt knee SAQ 3 x 10 2-3 sec hold  Supine heel  slide AROM Lt leg 5 sec hold x 15  Supine TKE stretch to start vaso for extension gains. Performed to tolerance 4 mins   Manual Supine posterior Femur glides on tibia with bolster under proximal tibia g4 for extension gains.  Neuro Re-ed (muscle activation, balance control) Standing feet together stance with SBA 1 min Standing feet hip width apart church pew sway control fwd/back x 20 each way with SBA Supine quad set 5 sec hold x 10    Vaso 10 mins medium compression in elevation Lt leg 34 deg  with TKE stretch start of activity.   TODAY'S TREATMENT                                                                          DATE:  03/28/2024 Therex:    HEP instruction/performance c cues for techniques, handout provided.  Trial set performed of each for comprehension and symptom assessment.  See below for exercise list Supine TKE 3 mins trial.   Manual Supine Lt knee flexion c distraction/IR mobilization c movement in elevation.   Self Care Education on edema reduction importance with emphasis on elevation, ice use, ankle pumps for reducing swelling within LE.   Education given during vaso time.   Vaso 10 mins medium compression in elevation Lt leg 34 deg   PATIENT EDUCATION:  Education details: HEP, POC Person educated: Patient Education method: Programmer, Multimedia, Demonstration, Verbal cues, and Handouts Education comprehension: verbalized understanding, returned demonstration, and verbal cues required  HOME EXERCISE PROGRAM: Access Code: [email or web address] URL: [email or web address] Date: 03/28/2024 Prepared by: Ozell Silvan  Exercises - Supine Heel Slide  - 3-5 x daily - 7 x weekly  - 1 sets - 10 reps - 5 hold - Supine Heel Slide with Strap  - 3-5 x daily - 7 x weekly - 1 sets - 10 reps - 5 hold - Seated Long Arc Quad  - 3-5 x daily - 7 x weekly - 1 sets - 5-10 reps - 2 hold - Seated Quad Set (Mirrored)  - 3-5 x daily - 7 x weekly - 1 sets - 10 reps - 5 hold - Quad Setting and Stretching  - 3-5 x daily - 7 x weekly - 1 sets - 10 reps - 5 hold - Supine Knee Extension Mobilization with Weight (Mirrored)  - 4-5 x daily - 7 x weekly - 1 sets - 1 reps - to tolerance up to 5 mins hold  ASSESSMENT:  CLINICAL IMPRESSION: Improvement in knee extension active range /TKE compared to eval.  Continued skilled PT services indicated to help improve strength and balance control for improved stability in ambulation/progressive mobility.   OBJECTIVE IMPAIRMENTS: Abnormal gait, decreased activity tolerance, decreased balance, decreased coordination, decreased endurance, decreased mobility, difficulty walking, decreased ROM, decreased strength, hypomobility, increased edema, increased fascial restrictions, impaired perceived functional ability, impaired flexibility, improper body mechanics, postural dysfunction, and pain.   ACTIVITY LIMITATIONS: carrying, lifting, bending, sitting, standing, squatting, sleeping, stairs, transfers, reach over head, and locomotion level  PARTICIPATION LIMITATIONS: meal prep, cleaning, laundry, interpersonal relationship, shopping, community activity, and yard work  PERSONAL FACTORS: Anemia, COPD, Depression, DVT history, GERD, Hep C, High blood pressure, High cholesterol, peripheral neuropathy. are also affecting patient's functional outcome.   REHAB POTENTIAL: Good  CLINICAL DECISION MAKING: Evolving/moderate complexity  EVALUATION COMPLEXITY: Moderate   GOALS: Goals reviewed with patient? Yes  SHORT TERM GOALS: (target date for Short term goals are 3 weeks 04/18/2024)   1.  Patient will  demonstrate independent use of home exercise program to  maintain progress from in clinic treatments.  Goal status: on going 03/31/2024  LONG TERM GOALS: (target dates for all long term goals are 10 weeks  06/06/2024 )   1. Patient will demonstrate/report pain at worst less than or equal to 2/10 to facilitate minimal limitation in daily activity secondary to pain symptoms.  Goal status: New   2. Patient will demonstrate independent use of home exercise program to facilitate ability to maintain/progress functional gains from skilled physical therapy services.  Goal status: New   3. Patient will demonstrate Patient specific functional scale avg > or = 8/10 to indicate reduced disability due to condition.   Goal status: New   4.  Patient will demonstrate Lt LE MMT 5/5 throughout to faciltiate usual transfers, stairs, squatting at [facility] for daily life.   Goal status: New   5.  Patient will demonstrate LRAD ambulation community distances > 500 ft.  Goal status: New   6.  Patient will demonstrate ascending/descending stairs reciprocally s UE assist for community integration, getting on /off mower.   Goal status: New   7.  Patient will demonstrate TUG with LRAD < 14 seconds to indicate reduced fall risk  Goal Status: New   PLAN:  PT FREQUENCY: 1-2x/week  PT DURATION: 10 weeks  PLANNED INTERVENTIONS: Can include 02853- PT Re-evaluation, 97110-Therapeutic exercises, 97530- Therapeutic activity, [id number]- Neuromuscular re-education, 97535- Self Care, 97140- Manual therapy, [phone]- Gait training, [phone]- Orthotic Fit/training, [phone]- Canalith repositioning, [id number]- Aquatic Therapy, [phone]- Electrical stimulation (unattended), [id number] Physical performance testing, 97016- Vasopneumatic device, [id number]- Ultrasound, [id number]- Traction (mechanical), [id number]- Ionotophoresis 4mg /ml Dexamethasone ,  [id number] - Needle insertion w/o injection 1 or 2 muscles, [id number] - Needle insertion w/o injection 3 or more muscles.   Patient/Family education, Balance training, Stair  training, Taping, Dry Needling, Joint mobilization, Joint manipulation, Spinal manipulation, Spinal mobilization, Scar mobilization, Vestibular training, Visual/preceptual remediation/compensation, DME instructions, Cryotherapy, and Moist heat.  All performed as medically necessary.  All included unless contraindicated  PLAN FOR NEXT SESSION: Static balance challenges, strengthening.    Ozell Silvan, PT, DPT, OCS, ATC 04/08/24  12:20 PM

## 2024-04-11 ENCOUNTER — Encounter: Payer: Self-pay | Admitting: Radiology

## 2024-04-11 NOTE — Therapy (Incomplete)
 OUTPATIENT PHYSICAL THERAPY TREATMENT   Patient Name: Thomas Mcneil MRN: [id number] DOB:[birth year], 73 y.o., male Today's Date: 04/11/2024  END OF SESSION:      Past Medical History:  Diagnosis Date   Anemia    Arthritis    Complication of anesthesia    slow to wake up after last surgery   COPD (chronic obstructive pulmonary disease) (HCC)    Depression    DVT (deep venous thrombosis) (HCC)    GERD (gastroesophageal reflux disease)    Hepatitis    hep c   High blood pressure    High cholesterol    MGUS (monoclonal gammopathy of unknown significance)    Neuropathy    Peripheral neuropathy    feet and hands   Past Surgical History:  Procedure Laterality Date   DIRECT LARYNGOSCOPY  12/26/2011   Procedure: DIRECT LARYNGOSCOPY;  Surgeon: Merilee Kraft, MD;  Location: [facility] OR;  Service: ENT;  Laterality: N/A;  Directy Laryngoscopy with biopsy [id number]   DIRECT LARYNGOSCOPY N/A 10/21/2012   Procedure: DIRECT LARYNGOSCOPY;  Surgeon: Merilee Kraft, MD;  Location: [facility] OR;  Service: ENT;  Laterality: N/A;   ESOPHAGOGASTRODUODENOSCOPY N/A 10/25/2012   Procedure: ESOPHAGOGASTRODUODENOSCOPY (EGD);  Surgeon: Renaye Sous, MD;  Location: [facility] ENDOSCOPY;  Service: Endoscopy;  Laterality: N/A;   TONSILLECTOMY     TOTAL HIP ARTHROPLASTY Right 10/19/2023   Procedure: ARTHROPLASTY, HIP, TOTAL, ANTERIOR APPROACH;  Surgeon: Jerri Kay HERO, MD;  Location: [facility] OR;  Service: Orthopedics;  Laterality: Right;   TOTAL KNEE ARTHROPLASTY Left 03/04/2024   Procedure: ARTHROPLASTY, KNEE, TOTAL;  Surgeon: Jerri Kay HERO, MD;  Location: [facility] ORS;  Service: Orthopedics;  Laterality: Left;   Patient Active Problem List   Diagnosis Date Noted   Status post total left knee replacement 03/04/2024   Primary osteoarthritis of left knee 02/12/2024   Status post total replacement of right hip 10/19/2023   Pressure injury of skin of left buttock 10/13/2023   Primary osteoarthritis of right hip 09/08/2023   Cocaine dependence,  binge pattern (HCC) 11/26/2015   Substance induced mood disorder (HCC) 11/26/2015   Intention tremor 11/26/2015   Alcohol  use disorder, severe, dependence (HCC) 09/04/2015   Severe recurrent major depression without psychotic features (HCC) 09/04/2015   Alcohol  dependence, uncomplicated (HCC) 09/01/2015   Alcohol -induced mood disorder (HCC) 09/01/2015   Cocaine abuse (HCC) 09/01/2015   Alcohol  abuse 04/05/2014   Essential tremor 01/04/2014   Other and unspecified hyperlipidemia 01/04/2014   B12 deficiency 01/04/2014   Melena 10/23/2012   GERD 05/27/2010   SHOULDER PAIN, LEFT 05/21/2010   ONYCHOMYCOSIS, TOENAILS 05/09/2010   KNEE PAIN, BILATERAL 06/27/2009   LEG CRAMPS 06/27/2009   Contusion 08/18/2008   LEG EDEMA, BILATERAL 01/13/2008   ERECTILE DYSFUNCTION 10/15/2007   TOBACCO ABUSE 08/16/2007   Mononeuritis of lower limb 08/16/2007   CORNS AND CALLUSES 08/16/2007   ALCOHOL  ABUSE, HX OF 08/16/2007    PCP: Sigrid Deidra Fox MD  REFERRING PROVIDER: Jule Ronal CROME, PA-C  REFERRING DIAG: [phone] (ICD-10-CM) - Status post total left knee replacement  THERAPY DIAG:  No diagnosis found.  Rationale for Evaluation and Treatment: Rehabilitation  ONSET DATE: Lt TKA 03/04/2024  SUBJECTIVE:   SUBJECTIVE STATEMENT: *** Pt indicated doing ok today.  Reported sick earlier this week but doing better.    PERTINENT HISTORY: Anemia, COPD, Depression, DVT history, GERD, Hep C, High blood pressure, High cholesterol, peripheral neuropathy.   PAIN:  NPRS scale: 0/10, 1/10 at worst in last day or so.  Pain  location: Lt knee  Pain description: stiffness, achy.  Sharp pains at times.  Aggravating factors: prolonged walking,standing, nighttime.  Stiffness with sitting.  Relieving factors: Ice machine. Tylenol    PRECAUTIONS: None  WEIGHT BEARING RESTRICTIONS: No  FALLS:  Has patient fallen in last 6 months? No  LIVING ENVIRONMENT: Lives with: Lives with someone.  Lives in:  House/apartment Stairs: has ramp.   Has following equipment at home: FWW, cane.  Elevated toilet seat, railings in bathroom.     OCCUPATION: No work.   PLOF: Independent.  Sit and watch TV, computer.   Has riding mowing.   Doesn't drive.   Usually does groceries shopping.   PATIENT GOALS: Reduce pain, improve walking.   OBJECTIVE:   PATIENT SURVEYS:  Patient-Specific Activity Scoring Scheme  0 represents "unable to perform." 10 represents "able to perform at prior level. 0 1 2 3 4 5 6 7 8 9  10 (Date and Score)   Activity Eval  03/28/2024 Did not gather due to patient difficulty in writing.  Will ask next viist 03/31/2024    Normal walking    6  2. Mowing yard   0   3.  Standing in house unassisted  6  4. Transfers (car)  3  5.    Score  3.75   Total score = sum of the activity scores/number of activities Minimum detectable change (90%CI) for average score = 2 points Minimum detectable change (90%CI) for single activity score = 3 points  COGNITION: 03/28/2024 Overall cognitive status: WFL    SENSATION: 03/28/2024 Not tested  EDEMA:  03/28/2024 Visual Lt leg edema from knee to ankle/lower leg.   MUSCLE LENGTH: 03/28/2024 No specific testing today  POSTURE:  03/28/2024 Mild forward lean in standing, walking.  Tremors noted in UE control.   PALPATION: 03/28/2024 Mild tenderness around incision and anterior knee.   LOWER EXTREMITY ROM:   ROM Right Eval 03/28/2024 Left Eval 03/28/2024 Left 04/08/2024  Hip flexion     Hip extension     Hip abduction     Hip adduction     Hip internal rotation     Hip external rotation     Knee flexion  110 AROM in supine heel slide AROM in supine heel slide 120  Knee extension  -10 seated LAQ AROM   -5 in seated LAQ AROM  Ankle dorsiflexion     Ankle plantarflexion     Ankle inversion     Ankle eversion      (Blank rows = not tested)  LOWER EXTREMITY MMT:  MMT Right Eval 03/28/2024  Left Eval 03/28/2024  Hip flexion 5/5 4+/5  Hip extension    Hip abduction    Hip adduction    Hip internal rotation    Hip external rotation    Knee flexion 5/5 5/5  Knee extension 5/5 4/5  Ankle dorsiflexion 5/5 5/5  Ankle plantarflexion    Ankle inversion    Ankle eversion     (Blank rows = not tested)  LOWER EXTREMITY SPECIAL TESTS:  03/28/2024 No specific testing.   FUNCTIONAL TESTS:  03/28/2024 18 inch chair transfer: unable s UE assist.  Lt SLS: unable   TUG with rollator 17 seconds  GAIT: 03/28/2024 Ambulation in clinic distances with RWW with reduced gait speed, step length with Rt leg due to lack of stance and toe off progression on Lt leg.  Lacking TKE in stance.  TODAY'S TREATMENT                                                                          DATE:  04/12/2024 ***  TODAY'S TREATMENT                                                                          DATE:  04/08/2024 Therex: Nustep lvl 5 6.5 mins UE/LE for knee ROM.  Incline gastroc stretch 30 sec x 3 bilaterally in standing  Seated LAQ Lt leg 4 lb 2 x 15 Supine Lt leg SAQ 3 sec hold 2 x 10  Supine Lt leg heel slide 5 sec hold x 5    TherActivity (to improve stairs, standing, walking, transfers) Leg press double leg 62 lbs x 15 slow lowering focus  Leg press single leg 31 lbs x 15 slow lowering focus, performed bilaterally    TODAY'S TREATMENT                                                                          DATE:  03/31/2024 Therex: Seated Lt leg LAQ 2 lb with end range pauses each direction 2 x 10  Seated hamstring curl Lt leg green band 2 x 10  Supine Lt knee SAQ 3 x 10 2-3 sec hold  Supine heel  slide AROM Lt leg 5 sec hold x 15  Supine TKE stretch to start vaso for extension gains. Performed to tolerance 4  mins   Manual Supine posterior Femur glides on tibia with bolster under proximal tibia g4 for extension gains.   Neuro Re-ed (muscle activation, balance control) Standing feet together stance with SBA 1 min Standing feet hip width apart church pew sway control fwd/back x 20 each way with SBA Supine quad set 5 sec hold x 10    Vaso 10 mins medium compression in elevation Lt leg 34 deg  with TKE stretch start of activity.   TODAY'S TREATMENT                                                                          DATE:  03/28/2024 Therex:    HEP instruction/performance c cues for techniques, handout provided.  Trial set performed of each for comprehension and symptom assessment.  See below for exercise list Supine TKE 3 mins trial.   Manual Supine Lt knee flexion c distraction/IR mobilization c movement in elevation.  Self Care Education on edema reduction importance with emphasis on elevation, ice use, ankle pumps for reducing swelling within LE.   Education given during vaso time.   Vaso 10 mins medium compression in elevation Lt leg 34 deg   PATIENT EDUCATION:  Education details: HEP, POC Person educated: Patient Education method: Programmer, Multimedia, Demonstration, Verbal cues, and Handouts Education comprehension: verbalized understanding, returned demonstration, and verbal cues required  HOME EXERCISE PROGRAM: Access Code: [email or web address] URL: [email or web address] Date: 03/28/2024 Prepared by: Ozell Silvan  Exercises - Supine Heel Slide  - 3-5 x daily - 7 x weekly - 1 sets - 10 reps - 5 hold - Supine Heel Slide with Strap  - 3-5 x daily - 7 x weekly - 1 sets - 10 reps - 5 hold - Seated Long Arc Quad  - 3-5 x daily - 7 x weekly - 1 sets - 5-10 reps - 2 hold - Seated Quad Set (Mirrored)  - 3-5 x daily - 7 x weekly - 1 sets - 10 reps - 5 hold - Quad Setting and Stretching  - 3-5 x daily - 7 x weekly - 1 sets - 10 reps - 5 hold - Supine Knee Extension Mobilization  with Weight (Mirrored)  - 4-5 x daily - 7 x weekly - 1 sets - 1 reps - to tolerance up to 5 mins hold  ASSESSMENT:  CLINICAL IMPRESSION: *** Improvement in knee extension active range /TKE compared to eval.  Continued skilled PT services indicated to help improve strength and balance control for improved stability in ambulation/progressive mobility.   OBJECTIVE IMPAIRMENTS: Abnormal gait, decreased activity tolerance, decreased balance, decreased coordination, decreased endurance, decreased mobility, difficulty walking, decreased ROM, decreased strength, hypomobility, increased edema, increased fascial restrictions, impaired perceived functional ability, impaired flexibility, improper body mechanics, postural dysfunction, and pain.   ACTIVITY LIMITATIONS: carrying, lifting, bending, sitting, standing, squatting, sleeping, stairs, transfers, reach over head, and locomotion level  PARTICIPATION LIMITATIONS: meal prep, cleaning, laundry, interpersonal relationship, shopping, community activity, and yard work  PERSONAL FACTORS: Anemia, COPD, Depression, DVT history, GERD, Hep C, High blood pressure, High cholesterol, peripheral neuropathy. are also affecting patient's functional outcome.   REHAB POTENTIAL: Good  CLINICAL DECISION MAKING: Evolving/moderate complexity  EVALUATION COMPLEXITY: Moderate   GOALS: Goals reviewed with patient? Yes  SHORT TERM GOALS: (target date for Short term goals are 3 weeks 04/18/2024)   1.  Patient will demonstrate independent use of home exercise program to maintain progress from in clinic treatments.  Goal status: on going 03/31/2024  LONG TERM GOALS: (target dates for all long term goals are 10 weeks  06/06/2024 )   1. Patient will demonstrate/report pain at worst less than or equal to 2/10 to facilitate minimal limitation in daily activity secondary to pain symptoms.  Goal status: New   2. Patient will demonstrate independent use of home exercise  program to facilitate ability to maintain/progress functional gains from skilled physical therapy services.  Goal status: New   3. Patient will demonstrate Patient specific functional scale avg > or = 8/10 to indicate reduced disability due to condition.   Goal status: New   4.  Patient will demonstrate Lt LE MMT 5/5 throughout to faciltiate usual transfers, stairs, squatting at [facility] for daily life.   Goal status: New   5.  Patient will demonstrate LRAD ambulation community distances > 500 ft.  Goal status: New   6.  Patient will demonstrate ascending/descending stairs reciprocally s UE assist for community integration, getting on /off  mower.   Goal status: New   7.  Patient will demonstrate TUG with LRAD < 14 seconds to indicate reduced fall risk  Goal Status: New   PLAN:  PT FREQUENCY: 1-2x/week  PT DURATION: 10 weeks  PLANNED INTERVENTIONS: Can include 02853- PT Re-evaluation, 97110-Therapeutic exercises, 97530- Therapeutic activity, [id number]- Neuromuscular re-education, 97535- Self Care, 97140- Manual therapy, [phone]- Gait training, [phone]- Orthotic Fit/training, [phone]- Canalith repositioning, [id number]- Aquatic Therapy, [phone]- Electrical stimulation (unattended), [id number] Physical performance testing, 97016- Vasopneumatic device, [id number]- Ultrasound, [id number]- Traction (mechanical), [id number]- Ionotophoresis 4mg /ml Dexamethasone ,  [id number] - Needle insertion w/o injection 1 or 2 muscles, 20561 - Needle insertion w/o injection 3 or more muscles.   Patient/Family education, Balance training, Stair training, Taping, Dry Needling, Joint mobilization, Joint manipulation, Spinal manipulation, Spinal mobilization, Scar mobilization, Vestibular training, Visual/preceptual remediation/compensation, DME instructions, Cryotherapy, and Moist heat.  All performed as medically necessary.  All included unless contraindicated  PLAN FOR NEXT SESSION: *** Static balance challenges, strengthening.    Susannah Daring,  PT, DPT 04/11/24 7:54 AM

## 2024-04-12 ENCOUNTER — Encounter

## 2024-04-14 ENCOUNTER — Encounter: Admitting: Rehabilitative and Restorative Service Providers"

## 2024-04-19 ENCOUNTER — Ambulatory Visit: Payer: Self-pay

## 2024-04-19 ENCOUNTER — Ambulatory Visit: Admitting: Rehabilitative and Restorative Service Providers"

## 2024-04-19 ENCOUNTER — Ambulatory Visit: Admitting: Physician Assistant

## 2024-04-19 DIAGNOSIS — Z96652 Presence of left artificial knee joint: Secondary | ICD-10-CM | POA: Diagnosis not present

## 2024-04-19 DIAGNOSIS — M6281 Muscle weakness (generalized): Secondary | ICD-10-CM

## 2024-04-19 DIAGNOSIS — G8929 Other chronic pain: Secondary | ICD-10-CM

## 2024-04-19 DIAGNOSIS — M25662 Stiffness of left knee, not elsewhere classified: Secondary | ICD-10-CM | POA: Diagnosis not present

## 2024-04-19 DIAGNOSIS — R262 Difficulty in walking, not elsewhere classified: Secondary | ICD-10-CM

## 2024-04-19 DIAGNOSIS — R600 Localized edema: Secondary | ICD-10-CM

## 2024-04-19 DIAGNOSIS — M25562 Pain in left knee: Secondary | ICD-10-CM

## 2024-04-19 NOTE — Therapy (Signed)
 OUTPATIENT PHYSICAL THERAPY TREATMENT   Patient Name: CODA MATHEY MRN: [id number] DOB:May 20, 1951, 73 y.o., male Today's Date: 04/19/2024  END OF SESSION:  PT End of Session - 04/19/24 0938     Visit Number 4    Number of Visits 20    Date for Recertification  06/06/24    Authorization Type [employer] $30 copay    Progress Note Due on Visit 10    PT Start Time 0930    PT Stop Time 1016    PT Time Calculation (min) 46 min    Activity Tolerance Patient tolerated treatment well    Behavior During Therapy WFL for tasks assessed/performed             Past Medical History:  Diagnosis Date   Anemia    Arthritis    Complication of anesthesia    slow to wake up after last surgery   COPD (chronic obstructive pulmonary disease) (HCC)    Depression    DVT (deep venous thrombosis) (HCC)    GERD (gastroesophageal reflux disease)    Hepatitis    hep c   High blood pressure    High cholesterol    MGUS (monoclonal gammopathy of unknown significance)    Neuropathy    Peripheral neuropathy    feet and hands   Past Surgical History:  Procedure Laterality Date   DIRECT LARYNGOSCOPY  12/26/2011   Procedure: DIRECT LARYNGOSCOPY;  Surgeon: Merilee Kraft, MD;  Location: [facility] OR;  Service: ENT;  Laterality: N/A;  Directy Laryngoscopy with biopsy [id number]   DIRECT LARYNGOSCOPY N/A 10/21/2012   Procedure: DIRECT LARYNGOSCOPY;  Surgeon: Merilee Kraft, MD;  Location: [facility] OR;  Service: ENT;  Laterality: N/A;   ESOPHAGOGASTRODUODENOSCOPY N/A 10/25/2012   Procedure: ESOPHAGOGASTRODUODENOSCOPY (EGD);  Surgeon: Renaye Sous, MD;  Location: [facility] ENDOSCOPY;  Service: Endoscopy;  Laterality: N/A;   TONSILLECTOMY     TOTAL HIP ARTHROPLASTY Right 10/19/2023   Procedure: ARTHROPLASTY, HIP, TOTAL, ANTERIOR APPROACH;  Surgeon: Jerri Kay HERO, MD;  Location: [facility] OR;  Service: Orthopedics;  Laterality: Right;   TOTAL KNEE ARTHROPLASTY Left 03/04/2024   Procedure: ARTHROPLASTY, KNEE, TOTAL;  Surgeon: Jerri Kay HERO, MD;  Location: [facility] ORS;  Service: Orthopedics;  Laterality: Left;   Patient Active Problem List   Diagnosis Date Noted   Status post total left knee replacement 03/04/2024   Primary osteoarthritis of left knee 02/12/2024   Status post total replacement of right hip 10/19/2023   Pressure injury of skin of left buttock 10/13/2023   Primary osteoarthritis of right hip 09/08/2023   Cocaine dependence, binge pattern (HCC) 11/26/2015   Substance induced mood disorder (HCC) 11/26/2015   Intention tremor 11/26/2015   Alcohol  use disorder, severe, dependence (HCC) 09/04/2015   Severe recurrent major depression without psychotic features (HCC) 09/04/2015   Alcohol  dependence, uncomplicated (HCC) 09/01/2015   Alcohol -induced mood disorder (HCC) 09/01/2015   Cocaine abuse (HCC) 09/01/2015   Alcohol  abuse 04/05/2014   Essential tremor 01/04/2014   Other and unspecified hyperlipidemia 01/04/2014   B12 deficiency 01/04/2014   Melena 10/23/2012   GERD 05/27/2010   SHOULDER PAIN, LEFT 05/21/2010   ONYCHOMYCOSIS, TOENAILS 05/09/2010   KNEE PAIN, BILATERAL 06/27/2009   LEG CRAMPS 06/27/2009   Contusion 08/18/2008   LEG EDEMA, BILATERAL 01/13/2008   ERECTILE DYSFUNCTION 10/15/2007   TOBACCO ABUSE 08/16/2007   Mononeuritis of lower limb 08/16/2007   CORNS AND CALLUSES 08/16/2007   ALCOHOL  ABUSE, HX OF 08/16/2007    PCP: Sigrid Deidra Fox MD  REFERRING PROVIDER: Jule Ronal LITTIE DEVONNA  REFERRING DIAG: [phone] (ICD-10-CM) - Status post total left knee replacement  THERAPY DIAG:  Chronic pain of left knee  Muscle weakness (generalized)  Stiffness of left knee  Difficulty in walking, not elsewhere classified  Localized edema  Rationale for Evaluation and Treatment: Rehabilitation  ONSET DATE: Lt TKA 03/04/2024  SUBJECTIVE:   SUBJECTIVE STATEMENT: Pt indicated over doing it reported for cancel last week wasn't specific to knee.  Reported stiffness in morning but no pain  today.  Reported good follow up with MD today.   PERTINENT HISTORY: Anemia, COPD, Depression, DVT history, GERD, Hep C, High blood pressure, High cholesterol, peripheral neuropathy.   PAIN:  NPRS scale: 0/10, 1/10 at worst in last day or so.  Pain location: Lt knee  Pain description: stiffness, achy.  Sharp pains at times.  Aggravating factors: prolonged walking,standing, nighttime.  Stiffness with sitting.  Relieving factors: Ice machine. Tylenol    PRECAUTIONS: None  WEIGHT BEARING RESTRICTIONS: No  FALLS:  Has patient fallen in last 6 months? No  LIVING ENVIRONMENT: Lives with: Lives with someone.  Lives in: House/apartment Stairs: has ramp.   Has following equipment at home: FWW, cane.  Elevated toilet seat, railings in bathroom.     OCCUPATION: No work.   PLOF: Independent.  Sit and watch TV, computer.   Has riding mowing.   Doesn't drive.   Usually does groceries shopping.   PATIENT GOALS: Reduce pain, improve walking.   OBJECTIVE:   PATIENT SURVEYS:  Patient-Specific Activity Scoring Scheme  0 represents "unable to perform." 10 represents "able to perform at prior level. 0 1 2 3 4 5 6 7 8 9  10 (Date and Score)   Activity Eval  03/28/2024 Did not gather due to patient difficulty in writing.  Will ask next viist 03/31/2024    Normal walking    6  2. Mowing yard   0   3.  Standing in house unassisted  6  4. Transfers (car)  3  5.    Score  3.75   Total score = sum of the activity scores/number of activities Minimum detectable change (90%CI) for average score = 2 points Minimum detectable change (90%CI) for single activity score = 3 points  COGNITION: 03/28/2024 Overall cognitive status: WFL    SENSATION: 03/28/2024 Not tested  EDEMA:  03/28/2024 Visual Lt leg edema from knee to ankle/lower leg.   MUSCLE LENGTH: 03/28/2024 No specific testing today  POSTURE:  03/28/2024 Mild forward lean in standing, walking.  Tremors noted in UE  control.   PALPATION: 03/28/2024 Mild tenderness around incision and anterior knee.   LOWER EXTREMITY ROM:   ROM Right Eval 03/28/2024 Left Eval 03/28/2024 Left 04/08/2024  Hip flexion     Hip extension     Hip abduction     Hip adduction     Hip internal rotation     Hip external rotation     Knee flexion  110 AROM in supine heel slide AROM in supine heel slide 120  Knee extension  -10 seated LAQ AROM   -5 in seated LAQ AROM  Ankle dorsiflexion     Ankle plantarflexion     Ankle inversion     Ankle eversion      (Blank rows = not tested)  LOWER EXTREMITY MMT:  MMT Right Eval 03/28/2024 Left Eval 03/28/2024  Hip flexion 5/5 4+/5  Hip extension    Hip abduction    Hip adduction  Hip internal rotation    Hip external rotation    Knee flexion 5/5 5/5  Knee extension 5/5 4/5  Ankle dorsiflexion 5/5 5/5  Ankle plantarflexion    Ankle inversion    Ankle eversion     (Blank rows = not tested)  LOWER EXTREMITY SPECIAL TESTS:  03/28/2024 No specific testing.   FUNCTIONAL TESTS:  03/28/2024 18 inch chair transfer: unable s UE assist.  Lt SLS: unable   TUG with rollator 17 seconds  GAIT: 03/28/2024 Ambulation in clinic distances with RWW with reduced gait speed, step length with Rt leg due to lack of stance and toe off progression on Lt leg.  Lacking TKE in stance.                                                                                                                                                                         TODAY'S TREATMENT                                                                          DATE:  04/19/2024 Therex: Nustep Lvl 6 UE/LE for ROM, endurance 8 mins.  Seated Lt leg LAQ 4 lb weight 2 x 15 with end range pauses each direction.  Supine SAQ black triangle wedge under knee .  Performed bilaterally concurrently 5 sec hold x 15 Extension heel prop start of vaso to tolerance - 4 mins   Neuro Re-ed Feet together stance  1 min with SBA Modified tandem stance (foot forward only ) 30 sec x 2 bilateral with SBA to min A at times.    TherActivity (to improve stairs, standing, walking, transfers) Leg press double leg 75 lbs x 15 slow lowering focus  Leg press single leg 37 lbs x 15 slow lowering focus, performed bilaterally   Vaso 10 mins medium compression in elevation Lt leg 34 deg  with TKE stretch start of activity.     TODAY'S TREATMENT                                                                          DATE:  04/08/2024 Therex: Nustep lvl 5 6.5 mins UE/LE for knee ROM.  Incline  gastroc stretch 30 sec x 3 bilaterally in standing  Seated LAQ Lt leg 4 lb 2 x 15 Supine Lt leg SAQ 3 sec hold 2 x 10  Supine Lt leg heel slide 5 sec hold x 5    TherActivity (to improve stairs, standing, walking, transfers) Leg press double leg 62 lbs x 15 slow lowering focus  Leg press single leg 31 lbs x 15 slow lowering focus, performed bilaterally    TODAY'S TREATMENT                                                                          DATE:  03/31/2024 Therex: Seated Lt leg LAQ 2 lb with end range pauses each direction 2 x 10  Seated hamstring curl Lt leg green band 2 x 10  Supine Lt knee SAQ 3 x 10 2-3 sec hold  Supine heel  slide AROM Lt leg 5 sec hold x 15  Supine TKE stretch to start vaso for extension gains. Performed to tolerance 4 mins   Manual Supine posterior Femur glides on tibia with bolster under proximal tibia g4 for extension gains.   Neuro Re-ed (muscle activation, balance control) Standing feet together stance with SBA 1 min Standing feet hip width apart church pew sway control fwd/back x 20 each way with SBA Supine quad set 5 sec hold x 10    Vaso 10 mins medium compression in elevation Lt leg 34 deg  with TKE stretch start of activity.   TODAY'S TREATMENT                                                                          DATE:  03/28/2024 Therex:    HEP  instruction/performance c cues for techniques, handout provided.  Trial set performed of each for comprehension and symptom assessment.  See below for exercise list Supine TKE 3 mins trial.   Manual Supine Lt knee flexion c distraction/IR mobilization c movement in elevation.   Self Care Education on edema reduction importance with emphasis on elevation, ice use, ankle pumps for reducing swelling within LE.   Education given during vaso time.   Vaso 10 mins medium compression in elevation Lt leg 34 deg   PATIENT EDUCATION:  Education details: HEP, POC Person educated: Patient Education method: Programmer, Multimedia, Demonstration, Verbal cues, and Handouts Education comprehension: verbalized understanding, returned demonstration, and verbal cues required  HOME EXERCISE PROGRAM: Access Code: [email or web address] URL: [email or web address] Date: 03/28/2024 Prepared by: Ozell Silvan  Exercises - Supine Heel Slide  - 3-5 x daily - 7 x weekly - 1 sets - 10 reps - 5 hold - Supine Heel Slide with Strap  - 3-5 x daily - 7 x weekly - 1 sets - 10 reps - 5 hold - Seated Long Arc Quad  - 3-5 x daily - 7 x weekly - 1 sets - 5-10 reps - 2 hold - Seated Quad Set (Mirrored)  - 3-5 x daily - 7  x weekly - 1 sets - 10 reps - 5 hold - Quad Setting and Stretching  - 3-5 x daily - 7 x weekly - 1 sets - 10 reps - 5 hold - Supine Knee Extension Mobilization with Weight (Mirrored)  - 4-5 x daily - 7 x weekly - 1 sets - 1 reps - to tolerance up to 5 mins hold  ASSESSMENT:  CLINICAL IMPRESSION: Initiated early balance activity with fair to good tolerance in static holds.  Tremors noted to impact control.  Flexion quality continued to look well.  Pt needs to continue to gain full TKE mobility and functional strength.   OBJECTIVE IMPAIRMENTS: Abnormal gait, decreased activity tolerance, decreased balance, decreased coordination, decreased endurance, decreased mobility, difficulty walking, decreased ROM, decreased  strength, hypomobility, increased edema, increased fascial restrictions, impaired perceived functional ability, impaired flexibility, improper body mechanics, postural dysfunction, and pain.   ACTIVITY LIMITATIONS: carrying, lifting, bending, sitting, standing, squatting, sleeping, stairs, transfers, reach over head, and locomotion level  PARTICIPATION LIMITATIONS: meal prep, cleaning, laundry, interpersonal relationship, shopping, community activity, and yard work  PERSONAL FACTORS: Anemia, COPD, Depression, DVT history, GERD, Hep C, High blood pressure, High cholesterol, peripheral neuropathy. are also affecting patient's functional outcome.   REHAB POTENTIAL: Good  CLINICAL DECISION MAKING: Evolving/moderate complexity  EVALUATION COMPLEXITY: Moderate   GOALS: Goals reviewed with patient? Yes  SHORT TERM GOALS: (target date for Short term goals are 3 weeks 04/18/2024)   1.  Patient will demonstrate independent use of home exercise program to maintain progress from in clinic treatments.  Goal status: Met 04/19/2024  LONG TERM GOALS: (target dates for all long term goals are 10 weeks  06/06/2024 )   1. Patient will demonstrate/report pain at worst less than or equal to 2/10 to facilitate minimal limitation in daily activity secondary to pain symptoms.  Goal status: on going 04/19/2024   2. Patient will demonstrate independent use of home exercise program to facilitate ability to maintain/progress functional gains from skilled physical therapy services.  Goal status: on going 04/19/2024   3. Patient will demonstrate Patient specific functional scale avg > or = 8/10 to indicate reduced disability due to condition.   Goal status: on going 04/19/2024   4.  Patient will demonstrate Lt LE MMT 5/5 throughout to faciltiate usual transfers, stairs, squatting at [facility] for daily life.   Goal status: on going 04/19/2024   5.  Patient will demonstrate LRAD ambulation community distances  > 500 ft.  Goal status: on going 04/19/2024   6.  Patient will demonstrate ascending/descending stairs reciprocally s UE assist for community integration, getting on /off mower.   Goal status: on going 04/19/2024   7.  Patient will demonstrate TUG with LRAD < 14 seconds to indicate reduced fall risk  Goal Status: on going 04/19/2024   PLAN:  PT FREQUENCY: 1-2x/week  PT DURATION: 10 weeks  PLANNED INTERVENTIONS: Can include 02853- PT Re-evaluation, 97110-Therapeutic exercises, 97530- Therapeutic activity, 97112- Neuromuscular re-education, 97535- Self Care, 97140- Manual therapy, [phone]- Gait training, [phone]- Orthotic Fit/training, [phone]- Canalith repositioning, [id number]- Aquatic Therapy, [phone]- Electrical stimulation (unattended), [id number] Physical performance testing, 97016- Vasopneumatic device, [id number]- Ultrasound, [id number]- Traction (mechanical), [id number]- Ionotophoresis 4mg /ml Dexamethasone ,  79439 - Needle insertion w/o injection 1 or 2 muscles, 20561 - Needle insertion w/o injection 3 or more muscles.   Patient/Family education, Balance training, Stair training, Taping, Dry Needling, Joint mobilization, Joint manipulation, Spinal manipulation, Spinal mobilization, Scar mobilization, Vestibular training, Visual/preceptual remediation/compensation, DME instructions, Cryotherapy, and Moist heat.  All performed as medically necessary.  All included unless contraindicated  PLAN FOR NEXT SESSION: Continue balance improvements, extension mobility/strength.    Ozell Silvan, PT, DPT, OCS, ATC 04/19/24  10:08 AM

## 2024-04-19 NOTE — Progress Notes (Signed)
 Post-Op Visit Note   Patient: Thomas Mcneil           Date of Birth: May 03, 1951           MRN: [id number] Visit Date: 04/19/2024 PCP: Sigrid Deidra Fox, MD   Assessment & Plan:  Chief Complaint:  Chief Complaint  Patient presents with   Left Knee - Pain   Visit Diagnoses:  1. Status post total left knee replacement     Plan: Patient is a pleasant 73 year old gentleman who comes in today 6 weeks status post left total knee replacement.  He has been doing well.  He is only taking Tylenol  for pain.  He has finished taking his aspirin for which he was taking for DVT prophylaxis.  He has been in physical therapy making great progress with range of motion.  He continues to work on balance and strengthening exercises.  Examination of his left knee reveals a fully healed surgical scar without complication.  Calf soft nontender.  Range of motion 0 to 120 degrees.  He is stable to valgus varus stress.  He is neurovascular intact distally.  At this point, he will continue with PT to work on balance and strengthening exercises.  He no longer needs anticoagulation from our standpoint.  Follow-up in 6 weeks with Dr. Jerri for recheck.  Call with concerns or questions in the meantime.  Follow-Up Instructions: Return in about 6 weeks (around 05/31/2024) for with Dr Jerri.   Orders:  Orders Placed This Encounter  Procedures   XR Knee 1-2 Views Left   No orders of the defined types were placed in this encounter.   Imaging: XR Knee 1-2 Views Left Result Date: 04/19/2024 Well-seated prosthesis without complication   PMFS History: Patient Active Problem List   Diagnosis Date Noted   Status post total left knee replacement 03/04/2024   Primary osteoarthritis of left knee 02/12/2024   Status post total replacement of right hip 10/19/2023   Pressure injury of skin of left buttock 10/13/2023   Primary osteoarthritis of right hip 09/08/2023   Cocaine dependence, binge pattern (HCC) 11/26/2015    Substance induced mood disorder (HCC) 11/26/2015   Intention tremor 11/26/2015   Alcohol  use disorder, severe, dependence (HCC) 09/04/2015   Severe recurrent major depression without psychotic features (HCC) 09/04/2015   Alcohol  dependence, uncomplicated (HCC) 09/01/2015   Alcohol -induced mood disorder (HCC) 09/01/2015   Cocaine abuse (HCC) 09/01/2015   Alcohol  abuse 04/05/2014   Essential tremor 01/04/2014   Other and unspecified hyperlipidemia 01/04/2014   B12 deficiency 01/04/2014   Melena 10/23/2012   GERD 05/27/2010   SHOULDER PAIN, LEFT 05/21/2010   ONYCHOMYCOSIS, TOENAILS 05/09/2010   KNEE PAIN, BILATERAL 06/27/2009   LEG CRAMPS 06/27/2009   Contusion 08/18/2008   LEG EDEMA, BILATERAL 01/13/2008   ERECTILE DYSFUNCTION 10/15/2007   TOBACCO ABUSE 08/16/2007   Mononeuritis of lower limb 08/16/2007   CORNS AND CALLUSES 08/16/2007   ALCOHOL  ABUSE, HX OF 08/16/2007   Past Medical History:  Diagnosis Date   Anemia    Arthritis    Complication of anesthesia    slow to wake up after last surgery   COPD (chronic obstructive pulmonary disease) (HCC)    Depression    DVT (deep venous thrombosis) (HCC)    GERD (gastroesophageal reflux disease)    Hepatitis    hep c   High blood pressure    High cholesterol    MGUS (monoclonal gammopathy of unknown significance)    Neuropathy  Peripheral neuropathy    feet and hands    Family History  Problem Relation Age of Onset   Hypertension Mother    Diverticulosis Mother    GER disease Mother    Lung disease Sister    Polymyositis Sister    Alcohol  abuse Maternal Uncle     Past Surgical History:  Procedure Laterality Date   DIRECT LARYNGOSCOPY  12/26/2011   Procedure: DIRECT LARYNGOSCOPY;  Surgeon: Merilee Kraft, MD;  Location: [facility] OR;  Service: ENT;  Laterality: N/A;  Directy Laryngoscopy with biopsy [id number]   DIRECT LARYNGOSCOPY N/A 10/21/2012   Procedure: DIRECT LARYNGOSCOPY;  Surgeon: Merilee Kraft, MD;  Location: [facility] OR;   Service: ENT;  Laterality: N/A;   ESOPHAGOGASTRODUODENOSCOPY N/A 10/25/2012   Procedure: ESOPHAGOGASTRODUODENOSCOPY (EGD);  Surgeon: Renaye Sous, MD;  Location: [facility] ENDOSCOPY;  Service: Endoscopy;  Laterality: N/A;   TONSILLECTOMY     TOTAL HIP ARTHROPLASTY Right 10/19/2023   Procedure: ARTHROPLASTY, HIP, TOTAL, ANTERIOR APPROACH;  Surgeon: Jerri Kay HERO, MD;  Location: [facility] OR;  Service: Orthopedics;  Laterality: Right;   TOTAL KNEE ARTHROPLASTY Left 03/04/2024   Procedure: ARTHROPLASTY, KNEE, TOTAL;  Surgeon: Jerri Kay HERO, MD;  Location: [facility] ORS;  Service: Orthopedics;  Laterality: Left;   Social History   Occupational History   Not on file  Tobacco Use   Smoking status: Some Days    Current packs/day: 0.25    Average packs/day: 0.2 packs/day for 46.3 years (11.6 ttl pk-yrs)    Types: Cigarettes    Start date: 06/09/2012   Smokeless tobacco: Never   Tobacco comments:    quitting, using e-cig    05/12/2022- smokes on occasion  Vaping Use   Vaping status: Former  Substance and Sexual Activity   Alcohol  use: Not Currently    Alcohol /week: 70.0 standard drinks of alcohol     Types: 70 Cans of beer per week    Comment: [place] - currently in [facility] for counseling/rehab. last drink 06/29/14   Drug use: Not Currently    Frequency: 0.2 times per week    Types: Crack cocaine    Comment: Crack quit 06/29/14   Sexual activity: Not on file

## 2024-04-21 ENCOUNTER — Ambulatory Visit: Admitting: Rehabilitative and Restorative Service Providers"

## 2024-04-21 ENCOUNTER — Encounter: Payer: Self-pay | Admitting: Rehabilitative and Restorative Service Providers"

## 2024-04-21 DIAGNOSIS — M25662 Stiffness of left knee, not elsewhere classified: Secondary | ICD-10-CM

## 2024-04-21 DIAGNOSIS — M25562 Pain in left knee: Secondary | ICD-10-CM

## 2024-04-21 DIAGNOSIS — R262 Difficulty in walking, not elsewhere classified: Secondary | ICD-10-CM | POA: Diagnosis not present

## 2024-04-21 DIAGNOSIS — G8929 Other chronic pain: Secondary | ICD-10-CM

## 2024-04-21 DIAGNOSIS — M6281 Muscle weakness (generalized): Secondary | ICD-10-CM

## 2024-04-21 DIAGNOSIS — R600 Localized edema: Secondary | ICD-10-CM

## 2024-04-21 NOTE — Therapy (Signed)
 OUTPATIENT PHYSICAL THERAPY TREATMENT   Patient Name: Thomas Mcneil MRN: [id number] DOB:1950-11-21, 73 y.o., male Today's Date: 04/21/2024  END OF SESSION:  PT End of Session - 04/21/24 1100     Visit Number 5    Number of Visits 20    Date for Recertification  06/06/24    Authorization Type [employer] $30 copay    Progress Note Due on Visit 10    PT Start Time 1056    PT Stop Time 1136    PT Time Calculation (min) 40 min    Activity Tolerance Patient tolerated treatment well    Behavior During Therapy WFL for tasks assessed/performed              Past Medical History:  Diagnosis Date   Anemia    Arthritis    Complication of anesthesia    slow to wake up after last surgery   COPD (chronic obstructive pulmonary disease) (HCC)    Depression    DVT (deep venous thrombosis) (HCC)    GERD (gastroesophageal reflux disease)    Hepatitis    hep c   High blood pressure    High cholesterol    MGUS (monoclonal gammopathy of unknown significance)    Neuropathy    Peripheral neuropathy    feet and hands   Past Surgical History:  Procedure Laterality Date   DIRECT LARYNGOSCOPY  12/26/2011   Procedure: DIRECT LARYNGOSCOPY;  Surgeon: Merilee Kraft, MD;  Location: [facility] OR;  Service: ENT;  Laterality: N/A;  Directy Laryngoscopy with biopsy [id number]   DIRECT LARYNGOSCOPY N/A 10/21/2012   Procedure: DIRECT LARYNGOSCOPY;  Surgeon: Merilee Kraft, MD;  Location: [facility] OR;  Service: ENT;  Laterality: N/A;   ESOPHAGOGASTRODUODENOSCOPY N/A 10/25/2012   Procedure: ESOPHAGOGASTRODUODENOSCOPY (EGD);  Surgeon: Renaye Sous, MD;  Location: [facility] ENDOSCOPY;  Service: Endoscopy;  Laterality: N/A;   TONSILLECTOMY     TOTAL HIP ARTHROPLASTY Right 10/19/2023   Procedure: ARTHROPLASTY, HIP, TOTAL, ANTERIOR APPROACH;  Surgeon: Jerri Kay HERO, MD;  Location: [facility] OR;  Service: Orthopedics;  Laterality: Right;   TOTAL KNEE ARTHROPLASTY Left 03/04/2024   Procedure: ARTHROPLASTY, KNEE, TOTAL;  Surgeon: Jerri Kay HERO, MD;  Location: [facility] ORS;  Service: Orthopedics;  Laterality: Left;   Patient Active Problem List   Diagnosis Date Noted   Status post total left knee replacement 03/04/2024   Primary osteoarthritis of left knee 02/12/2024   Status post total replacement of right hip 10/19/2023   Pressure injury of skin of left buttock 10/13/2023   Primary osteoarthritis of right hip 09/08/2023   Cocaine dependence, binge pattern (HCC) 11/26/2015   Substance induced mood disorder (HCC) 11/26/2015   Intention tremor 11/26/2015   Alcohol  use disorder, severe, dependence (HCC) 09/04/2015   Severe recurrent major depression without psychotic features (HCC) 09/04/2015   Alcohol  dependence, uncomplicated (HCC) 09/01/2015   Alcohol -induced mood disorder (HCC) 09/01/2015   Cocaine abuse (HCC) 09/01/2015   Alcohol  abuse 04/05/2014   Essential tremor 01/04/2014   Other and unspecified hyperlipidemia 01/04/2014   B12 deficiency 01/04/2014   Melena 10/23/2012   GERD 05/27/2010   SHOULDER PAIN, LEFT 05/21/2010   ONYCHOMYCOSIS, TOENAILS 05/09/2010   KNEE PAIN, BILATERAL 06/27/2009   LEG CRAMPS 06/27/2009   Contusion 08/18/2008   LEG EDEMA, BILATERAL 01/13/2008   ERECTILE DYSFUNCTION 10/15/2007   TOBACCO ABUSE 08/16/2007   Mononeuritis of lower limb 08/16/2007   CORNS AND CALLUSES 08/16/2007   ALCOHOL  ABUSE, HX OF 08/16/2007    PCP: Sigrid Deidra Fox  MD  REFERRING PROVIDER: Jule Ronal CROME, PA-C  REFERRING DIAG: [phone] (ICD-10-CM) - Status post total left knee replacement  THERAPY DIAG:  Chronic pain of left knee  Muscle weakness (generalized)  Stiffness of left knee  Difficulty in walking, not elsewhere classified  Localized edema  Rationale for Evaluation and Treatment: Rehabilitation  ONSET DATE: Lt TKA 03/04/2024  SUBJECTIVE:   SUBJECTIVE STATEMENT: Pt indicated feeling no complaints today in pain upon arrival.  Asked questions about riding a bike outside and how long he might  have to do therapy.   PERTINENT HISTORY: Anemia, COPD, Depression, DVT history, GERD, Hep C, High blood pressure, High cholesterol, peripheral neuropathy.   PAIN:  NPRS scale: 0/10, 1/10 at worst in last day or so.  Pain location: Lt knee  Pain description: stiffness, achy.  Sharp pains at times.  Aggravating factors: prolonged walking,standing, nighttime.  Stiffness with sitting.  Relieving factors: Ice machine. Tylenol    PRECAUTIONS: None  WEIGHT BEARING RESTRICTIONS: No  FALLS:  Has patient fallen in last 6 months? No  LIVING ENVIRONMENT: Lives with: Lives with someone.  Lives in: House/apartment Stairs: has ramp.   Has following equipment at home: FWW, cane.  Elevated toilet seat, railings in bathroom.     OCCUPATION: No work.   PLOF: Independent.  Sit and watch TV, computer.   Has riding mowing.   Doesn't drive.   Usually does groceries shopping.   PATIENT GOALS: Reduce pain, improve walking.   OBJECTIVE:   PATIENT SURVEYS:  Patient-Specific Activity Scoring Scheme  0 represents "unable to perform." 10 represents "able to perform at prior level. 0 1 2 3 4 5 6 7 8 9  10 (Date and Score)   Activity Eval  03/28/2024 Did not gather due to patient difficulty in writing.  Will ask next viist 03/31/2024    Normal walking    6  2. Mowing yard   0   3.  Standing in house unassisted  6  4. Transfers (car)  3  5.    Score  3.75   Total score = sum of the activity scores/number of activities Minimum detectable change (90%CI) for average score = 2 points Minimum detectable change (90%CI) for single activity score = 3 points  COGNITION: 03/28/2024 Overall cognitive status: WFL    SENSATION: 03/28/2024 Not tested  EDEMA:  03/28/2024 Visual Lt leg edema from knee to ankle/lower leg.   MUSCLE LENGTH: 03/28/2024 No specific testing today  POSTURE:  03/28/2024 Mild forward lean in standing, walking.  Tremors noted in UE control.    PALPATION: 03/28/2024 Mild tenderness around incision and anterior knee.   LOWER EXTREMITY ROM:   ROM Right Eval 03/28/2024 Left Eval 03/28/2024 Left 04/08/2024  Hip flexion     Hip extension     Hip abduction     Hip adduction     Hip internal rotation     Hip external rotation     Knee flexion  110 AROM in supine heel slide AROM in supine heel slide 120  Knee extension  -10 seated LAQ AROM   -5 in seated LAQ AROM  Ankle dorsiflexion     Ankle plantarflexion     Ankle inversion     Ankle eversion      (Blank rows = not tested)  LOWER EXTREMITY MMT:  MMT Right Eval 03/28/2024 Left Eval 03/28/2024  Hip flexion 5/5 4+/5  Hip extension    Hip abduction    Hip adduction    Hip internal  rotation    Hip external rotation    Knee flexion 5/5 5/5  Knee extension 5/5 4/5  Ankle dorsiflexion 5/5 5/5  Ankle plantarflexion    Ankle inversion    Ankle eversion     (Blank rows = not tested)  LOWER EXTREMITY SPECIAL TESTS:  03/28/2024 No specific testing.   FUNCTIONAL TESTS:  03/28/2024 18 inch chair transfer: unable s UE assist.  Lt SLS: unable   TUG with rollator 17 seconds  GAIT: 03/28/2024 Ambulation in clinic distances with RWW with reduced gait speed, step length with Rt leg due to lack of stance and toe off progression on Lt leg.  Lacking TKE in stance.                                                                                                                                                                         TODAY'S TREATMENT                                                                          DATE:  04/21/2024 Therex: Nustep Lvl 6 UE/LE for ROM, endurance 10 mins.    Neuro Re-ed Tandem stance in // bars 1 min x 1 bilateral with SBA with moderate HHA touch down on bars in LOB.  Forward stepping weight shift in // bars with SBA to min A at times x 5 each LE Reverse stepping weight shift in // bars with SBA to min A at times x 5 each  LE   TherActivity (to improve stairs, standing, walking, transfers) Leg press double leg 100 lbs x 15 slow lowering focus  Leg press single leg 50 lbs x 15 slow lowering focus, performed bilaterally  Sit to stand to sit transfer s UE assist x 5 throughout visit.       TODAY'S TREATMENT                                                                          DATE:  04/19/2024 Therex: Nustep Lvl 6 UE/LE for ROM, endurance 8 mins.  Seated Lt leg LAQ 4 lb weight 2 x 15 with end range pauses each direction.  Supine SAQ black triangle  wedge under knee .  Performed bilaterally concurrently 5 sec hold x 15 Extension heel prop start of vaso to tolerance - 4 mins   Neuro Re-ed Feet together stance 1 min with SBA Modified tandem stance (foot forward only ) 30 sec x 2 bilateral with SBA to min A at times.    TherActivity (to improve stairs, standing, walking, transfers) Leg press double leg 75 lbs x 15 slow lowering focus  Leg press single leg 37 lbs x 15 slow lowering focus, performed bilaterally   Vaso 10 mins medium compression in elevation Lt leg 34 deg  with TKE stretch start of activity.     TODAY'S TREATMENT                                                                          DATE:  04/08/2024 Therex: Nustep lvl 5 6.5 mins UE/LE for knee ROM.  Incline gastroc stretch 30 sec x 3 bilaterally in standing  Seated LAQ Lt leg 4 lb 2 x 15 Supine Lt leg SAQ 3 sec hold 2 x 10  Supine Lt leg heel slide 5 sec hold x 5    TherActivity (to improve stairs, standing, walking, transfers) Leg press double leg 62 lbs x 15 slow lowering focus  Leg press single leg 31 lbs x 15 slow lowering focus, performed bilaterally    TODAY'S TREATMENT                                                                          DATE:  03/31/2024 Therex: Seated Lt leg LAQ 2 lb with end range pauses each direction 2 x 10  Seated hamstring curl Lt leg green band 2 x 10  Supine Lt knee SAQ 3 x 10 2-3 sec  hold  Supine heel  slide AROM Lt leg 5 sec hold x 15  Supine TKE stretch to start vaso for extension gains. Performed to tolerance 4 mins   Manual Supine posterior Femur glides on tibia with bolster under proximal tibia g4 for extension gains.   Neuro Re-ed (muscle activation, balance control) Standing feet together stance with SBA 1 min Standing feet hip width apart church pew sway control fwd/back x 20 each way with SBA Supine quad set 5 sec hold x 10    Vaso 10 mins medium compression in elevation Lt leg 34 deg  with TKE stretch start of activity.   PATIENT EDUCATION:  Education details: HEP, POC Person educated: Patient Education method: Programmer, Multimedia, Demonstration, Verbal cues, and Handouts Education comprehension: verbalized understanding, returned demonstration, and verbal cues required  HOME EXERCISE PROGRAM: Access Code: [email or web address] URL: [email or web address] Date: 03/28/2024 Prepared by: Ozell Silvan  Exercises - Supine Heel Slide  - 3-5 x daily - 7 x weekly - 1 sets - 10 reps - 5 hold - Supine Heel Slide with Strap  - 3-5 x daily - 7 x weekly - 1 sets - 10 reps - 5 hold - Seated  Long Arc Quad  - 3-5 x daily - 7 x weekly - 1 sets - 5-10 reps - 2 hold - Seated Quad Set (Mirrored)  - 3-5 x daily - 7 x weekly - 1 sets - 10 reps - 5 hold - Quad Setting and Stretching  - 3-5 x daily - 7 x weekly - 1 sets - 10 reps - 5 hold - Supine Knee Extension Mobilization with Weight (Mirrored)  - 4-5 x daily - 7 x weekly - 1 sets - 1 reps - to tolerance up to 5 mins hold  ASSESSMENT:  CLINICAL IMPRESSION: Discussed today with patient about importance of progressing strength and control to continue to help Lt leg performance.  Flexion mobility good but gains in Lt knee extension still warranted. Pt described some concerns with cost of attendance to therapy.  Plan for 2 more next week prior to PA return visit.  May be appropriate for earlier HEP transitioning at that time  pending PT desires.   OBJECTIVE IMPAIRMENTS: Abnormal gait, decreased activity tolerance, decreased balance, decreased coordination, decreased endurance, decreased mobility, difficulty walking, decreased ROM, decreased strength, hypomobility, increased edema, increased fascial restrictions, impaired perceived functional ability, impaired flexibility, improper body mechanics, postural dysfunction, and pain.   ACTIVITY LIMITATIONS: carrying, lifting, bending, sitting, standing, squatting, sleeping, stairs, transfers, reach over head, and locomotion level  PARTICIPATION LIMITATIONS: meal prep, cleaning, laundry, interpersonal relationship, shopping, community activity, and yard work  PERSONAL FACTORS: Anemia, COPD, Depression, DVT history, GERD, Hep C, High blood pressure, High cholesterol, peripheral neuropathy. are also affecting patient's functional outcome.   REHAB POTENTIAL: Good  CLINICAL DECISION MAKING: Evolving/moderate complexity  EVALUATION COMPLEXITY: Moderate   GOALS: Goals reviewed with patient? Yes  SHORT TERM GOALS: (target date for Short term goals are 3 weeks 04/18/2024)   1.  Patient will demonstrate independent use of home exercise program to maintain progress from in clinic treatments.  Goal status: Met 04/19/2024  LONG TERM GOALS: (target dates for all long term goals are 10 weeks  06/06/2024 )   1. Patient will demonstrate/report pain at worst less than or equal to 2/10 to facilitate minimal limitation in daily activity secondary to pain symptoms.  Goal status: on going 04/19/2024   2. Patient will demonstrate independent use of home exercise program to facilitate ability to maintain/progress functional gains from skilled physical therapy services.  Goal status: on going 04/19/2024   3. Patient will demonstrate Patient specific functional scale avg > or = 8/10 to indicate reduced disability due to condition.   Goal status: on going 04/19/2024   4.  Patient  will demonstrate Lt LE MMT 5/5 throughout to faciltiate usual transfers, stairs, squatting at [facility] for daily life.   Goal status: on going 04/19/2024   5.  Patient will demonstrate LRAD ambulation community distances > 500 ft.  Goal status: on going 04/19/2024   6.  Patient will demonstrate ascending/descending stairs reciprocally s UE assist for community integration, getting on /off mower.   Goal status: on going 04/19/2024   7.  Patient will demonstrate TUG with LRAD < 14 seconds to indicate reduced fall risk  Goal Status: on going 04/19/2024   PLAN:  PT FREQUENCY: 1-2x/week  PT DURATION: 10 weeks  PLANNED INTERVENTIONS: Can include 02853- PT Re-evaluation, 97110-Therapeutic exercises, 97530- Therapeutic activity, [id number]- Neuromuscular re-education, 97535- Self Care, [id number]- Manual therapy, [id number]- Gait training, [phone]- Orthotic Fit/training, [phone]- Canalith repositioning, [id number]- Aquatic Therapy, [id number]- Electrical stimulation (unattended), [id number] Physical performance testing, 97016- Vasopneumatic device, [id number]-  Ultrasound, 02987- Traction (mechanical), [id number]- Ionotophoresis 4mg /ml Dexamethasone ,  [id number] - Needle insertion w/o injection 1 or 2 muscles, 20561 - Needle insertion w/o injection 3 or more muscles.   Patient/Family education, Balance training, Stair training, Taping, Dry Needling, Joint mobilization, Joint manipulation, Spinal manipulation, Spinal mobilization, Scar mobilization, Vestibular training, Visual/preceptual remediation/compensation, DME instructions, Cryotherapy, and Moist heat.  All performed as medically necessary.  All included unless contraindicated  PLAN FOR NEXT SESSION: Continue balance improvement   Ozell Silvan, PT, DPT, OCS, ATC 04/21/24  11:40 AM

## 2024-04-26 ENCOUNTER — Ambulatory Visit: Admitting: Rehabilitative and Restorative Service Providers"

## 2024-04-26 ENCOUNTER — Encounter: Payer: Self-pay | Admitting: Rehabilitative and Restorative Service Providers"

## 2024-04-26 DIAGNOSIS — R600 Localized edema: Secondary | ICD-10-CM

## 2024-04-26 DIAGNOSIS — R262 Difficulty in walking, not elsewhere classified: Secondary | ICD-10-CM | POA: Diagnosis not present

## 2024-04-26 DIAGNOSIS — M25562 Pain in left knee: Secondary | ICD-10-CM | POA: Diagnosis not present

## 2024-04-26 DIAGNOSIS — M6281 Muscle weakness (generalized): Secondary | ICD-10-CM | POA: Diagnosis not present

## 2024-04-26 DIAGNOSIS — G8929 Other chronic pain: Secondary | ICD-10-CM

## 2024-04-26 DIAGNOSIS — M25662 Stiffness of left knee, not elsewhere classified: Secondary | ICD-10-CM

## 2024-04-26 NOTE — Therapy (Signed)
 OUTPATIENT PHYSICAL THERAPY TREATMENT   Patient Name: Thomas Mcneil MRN: [id number] DOB:Oct 09, 1950, 73 y.o., male Today's Date: 04/26/2024  END OF SESSION:  PT End of Session - 04/26/24 0942     Visit Number 6    Number of Visits 20    Date for Recertification  06/06/24    Authorization Type [employer] $30 copay    Progress Note Due on Visit 10    PT Start Time 0935    PT Stop Time 1013    PT Time Calculation (min) 38 min    Activity Tolerance Patient tolerated treatment well    Behavior During Therapy WFL for tasks assessed/performed               Past Medical History:  Diagnosis Date   Anemia    Arthritis    Complication of anesthesia    slow to wake up after last surgery   COPD (chronic obstructive pulmonary disease) (HCC)    Depression    DVT (deep venous thrombosis) (HCC)    GERD (gastroesophageal reflux disease)    Hepatitis    hep c   High blood pressure    High cholesterol    MGUS (monoclonal gammopathy of unknown significance)    Neuropathy    Peripheral neuropathy    feet and hands   Past Surgical History:  Procedure Laterality Date   DIRECT LARYNGOSCOPY  12/26/2011   Procedure: DIRECT LARYNGOSCOPY;  Surgeon: Merilee Kraft, MD;  Location: [facility] OR;  Service: ENT;  Laterality: N/A;  Directy Laryngoscopy with biopsy [id number]   DIRECT LARYNGOSCOPY N/A 10/21/2012   Procedure: DIRECT LARYNGOSCOPY;  Surgeon: Merilee Kraft, MD;  Location: [facility] OR;  Service: ENT;  Laterality: N/A;   ESOPHAGOGASTRODUODENOSCOPY N/A 10/25/2012   Procedure: ESOPHAGOGASTRODUODENOSCOPY (EGD);  Surgeon: Renaye Sous, MD;  Location: [facility] ENDOSCOPY;  Service: Endoscopy;  Laterality: N/A;   TONSILLECTOMY     TOTAL HIP ARTHROPLASTY Right 10/19/2023   Procedure: ARTHROPLASTY, HIP, TOTAL, ANTERIOR APPROACH;  Surgeon: Jerri Kay HERO, MD;  Location: [facility] OR;  Service: Orthopedics;  Laterality: Right;   TOTAL KNEE ARTHROPLASTY Left 03/04/2024   Procedure: ARTHROPLASTY, KNEE, TOTAL;  Surgeon: Jerri Kay HERO, MD;  Location: [facility] ORS;  Service: Orthopedics;  Laterality: Left;   Patient Active Problem List   Diagnosis Date Noted   Status post total left knee replacement 03/04/2024   Primary osteoarthritis of left knee 02/12/2024   Status post total replacement of right hip 10/19/2023   Pressure injury of skin of left buttock 10/13/2023   Primary osteoarthritis of right hip 09/08/2023   Cocaine dependence, binge pattern (HCC) 11/26/2015   Substance induced mood disorder (HCC) 11/26/2015   Intention tremor 11/26/2015   Alcohol  use disorder, severe, dependence (HCC) 09/04/2015   Severe recurrent major depression without psychotic features (HCC) 09/04/2015   Alcohol  dependence, uncomplicated (HCC) 09/01/2015   Alcohol -induced mood disorder (HCC) 09/01/2015   Cocaine abuse (HCC) 09/01/2015   Alcohol  abuse 04/05/2014   Essential tremor 01/04/2014   Other and unspecified hyperlipidemia 01/04/2014   B12 deficiency 01/04/2014   Melena 10/23/2012   GERD 05/27/2010   SHOULDER PAIN, LEFT 05/21/2010   ONYCHOMYCOSIS, TOENAILS 05/09/2010   KNEE PAIN, BILATERAL 06/27/2009   LEG CRAMPS 06/27/2009   Contusion 08/18/2008   LEG EDEMA, BILATERAL 01/13/2008   ERECTILE DYSFUNCTION 10/15/2007   TOBACCO ABUSE 08/16/2007   Mononeuritis of lower limb 08/16/2007   CORNS AND CALLUSES 08/16/2007   ALCOHOL  ABUSE, HX OF 08/16/2007    PCP: Sigrid Kays,  [place] MD  REFERRING PROVIDER: Jule Ronal CROME, PA-C  REFERRING DIAG: [phone] (ICD-10-CM) - Status post total left knee replacement  THERAPY DIAG:  Chronic pain of left knee  Muscle weakness (generalized)  Stiffness of left knee  Difficulty in walking, not elsewhere classified  Localized edema  Rationale for Evaluation and Treatment: Rehabilitation  ONSET DATE: Lt TKA 03/04/2024  SUBJECTIVE:   SUBJECTIVE STATEMENT: Pt indicated no specific pain complaints upon arrival.  Reported some weakness after last visit for a short period of time  due to strengthening.   PERTINENT HISTORY: Anemia, COPD, Depression, DVT history, GERD, Hep C, High blood pressure, High cholesterol, peripheral neuropathy.   PAIN:  NPRS scale: 0/10, 1/10 at worst in last day or so.  Pain location: Lt knee  Pain description: stiffness, achy.  Sharp pains at times.  Aggravating factors: prolonged walking,standing, nighttime.  Stiffness with sitting.  Relieving factors: Ice machine. Tylenol    PRECAUTIONS: None  WEIGHT BEARING RESTRICTIONS: No  FALLS:  Has patient fallen in last 6 months? No  LIVING ENVIRONMENT: Lives with: Lives with someone.  Lives in: House/apartment Stairs: has ramp.   Has following equipment at home: FWW, cane.  Elevated toilet seat, railings in bathroom.     OCCUPATION: No work.   PLOF: Independent.  Sit and watch TV, computer.   Has riding mowing.   Doesn't drive.   Usually does groceries shopping.   PATIENT GOALS: Reduce pain, improve walking.   OBJECTIVE:   PATIENT SURVEYS:  Patient-Specific Activity Scoring Scheme  0 represents "unable to perform." 10 represents "able to perform at prior level. 0 1 2 3 4 5 6 7 8 9  10 (Date and Score)   Activity Eval  03/28/2024 Did not gather due to patient difficulty in writing.  Will ask next viist 03/31/2024    Normal walking    6  2. Mowing yard   0   3.  Standing in house unassisted  6  4. Transfers (car)  3  5.    Score  3.75   Total score = sum of the activity scores/number of activities Minimum detectable change (90%CI) for average score = 2 points Minimum detectable change (90%CI) for single activity score = 3 points  COGNITION: 03/28/2024 Overall cognitive status: WFL    SENSATION: 03/28/2024 Not tested  EDEMA:  03/28/2024 Visual Lt leg edema from knee to ankle/lower leg.   MUSCLE LENGTH: 03/28/2024 No specific testing today  POSTURE:  03/28/2024 Mild forward lean in standing, walking.  Tremors noted in UE control.    PALPATION: 03/28/2024 Mild tenderness around incision and anterior knee.   LOWER EXTREMITY ROM:   ROM Right Eval 03/28/2024 Left Eval 03/28/2024 Left 04/08/2024  Hip flexion     Hip extension     Hip abduction     Hip adduction     Hip internal rotation     Hip external rotation     Knee flexion  110 AROM in supine heel slide AROM in supine heel slide 120  Knee extension  -10 seated LAQ AROM   -5 in seated LAQ AROM  Ankle dorsiflexion     Ankle plantarflexion     Ankle inversion     Ankle eversion      (Blank rows = not tested)  LOWER EXTREMITY MMT:  MMT Right Eval 03/28/2024 Left Eval 03/28/2024  Hip flexion 5/5 4+/5  Hip extension    Hip abduction    Hip adduction    Hip internal rotation  Hip external rotation    Knee flexion 5/5 5/5  Knee extension 5/5 4/5  Ankle dorsiflexion 5/5 5/5  Ankle plantarflexion    Ankle inversion    Ankle eversion     (Blank rows = not tested)  LOWER EXTREMITY SPECIAL TESTS:  03/28/2024 No specific testing.   FUNCTIONAL TESTS:  03/28/2024 18 inch chair transfer: unable s UE assist.  Lt SLS: unable   TUG with rollator 17 seconds  GAIT: 03/28/2024 Ambulation in clinic distances with RWW with reduced gait speed, step length with Rt leg due to lack of stance and toe off progression on Lt leg.  Lacking TKE in stance.                                                                                                                                                                         TODAY'S TREATMENT                                                                          DATE:  04/26/2024 Therex: UBE LE only lvl 3.5 for ROM 6 mins (unsure of seat positioning) Incline gastroc stretch 30 sec x 3 bilaterally   Neuro Re-ed Tandem stance in // bars 1 min x 1 bilateral with SBA with moderate HHA touch down on bars in LOB.  Feet together stance 1 min with SBA Lateral stepping over 6 inch hurdle x 5 each way with  occasional HHA on bar and close SBA to min A at times.    TherActivity (to improve stairs, standing, walking, transfers) Step up forward 6 inch step x 15 WB on Lt leg with hands lightly on bar Lateral step up/down 4 inch step x 15 WB on Lt leg with slow lowering focus, bilateral hands lightly on bar.   TODAY'S TREATMENT                                                                          DATE:  04/21/2024 Therex: Nustep Lvl 6 UE/LE for ROM, endurance 10 mins.  Neuro Re-ed Tandem stance in // bars 1 min x 1 bilateral with SBA with moderate HHA touch down on bars in LOB.  Forward stepping weight shift in // bars with SBA to min A at times x 5 each LE Reverse stepping weight shift in // bars with SBA to min A at times x 5 each LE   TherActivity (to improve stairs, standing, walking, transfers) Leg press double leg 100 lbs x 15 slow lowering focus  Leg press single leg 50 lbs x 15 slow lowering focus, performed bilaterally  Sit to stand to sit transfer s UE assist x 5 throughout visit.    TODAY'S TREATMENT                                                                          DATE:  04/19/2024 Therex: Nustep Lvl 6 UE/LE for ROM, endurance 8 mins.  Seated Lt leg LAQ 4 lb weight 2 x 15 with end range pauses each direction.  Supine SAQ black triangle wedge under knee .  Performed bilaterally concurrently 5 sec hold x 15 Extension heel prop start of vaso to tolerance - 4 mins   Neuro Re-ed Feet together stance 1 min with SBA Modified tandem stance (foot forward only ) 30 sec x 2 bilateral with SBA to min A at times.    TherActivity (to improve stairs, standing, walking, transfers) Leg press double leg 75 lbs x 15 slow lowering focus  Leg press single leg 37 lbs x 15 slow lowering focus, performed bilaterally   Vaso 10 mins medium compression in elevation Lt leg 34 deg  with TKE stretch start of activity.     TODAY'S TREATMENT                                                                           DATE:  04/08/2024 Therex: Nustep lvl 5 6.5 mins UE/LE for knee ROM.  Incline gastroc stretch 30 sec x 3 bilaterally in standing  Seated LAQ Lt leg 4 lb 2 x 15 Supine Lt leg SAQ 3 sec hold 2 x 10  Supine Lt leg heel slide 5 sec hold x 5    TherActivity (to improve stairs, standing, walking, transfers) Leg press double leg 62 lbs x 15 slow lowering focus  Leg press single leg 31 lbs x 15 slow lowering focus, performed bilaterally     PATIENT EDUCATION:  Education details: HEP, POC Person educated: Patient Education method: Programmer, Multimedia, Demonstration, Verbal cues, and Handouts Education comprehension: verbalized understanding, returned demonstration, and verbal cues required  HOME EXERCISE PROGRAM: Access Code: [email or web address] URL: [email or web address] Date: 03/28/2024 Prepared by: Ozell Silvan  Exercises - Supine Heel Slide  - 3-5 x daily - 7 x weekly - 1 sets - 10 reps - 5 hold - Supine Heel Slide with Strap  - 3-5 x daily - 7 x weekly - 1 sets - 10 reps - 5 hold - Seated Long Arc Quad  - 3-5 x daily - 7 x weekly - 1 sets - 5-10 reps - 2 hold - Seated Quad Set (Mirrored)  -  3-5 x daily - 7 x weekly - 1 sets - 10 reps - 5 hold - Quad Setting and Stretching  - 3-5 x daily - 7 x weekly - 1 sets - 10 reps - 5 hold - Supine Knee Extension Mobilization with Weight (Mirrored)  - 4-5 x daily - 7 x weekly - 1 sets - 1 reps - to tolerance up to 5 mins hold  ASSESSMENT:  CLINICAL IMPRESSION: Improved ambulation stability noted with short distances with SBA with no assistive device (< 10 ft).  Continued improvement in WB acceptance in surgery leg.   OBJECTIVE IMPAIRMENTS: Abnormal gait, decreased activity tolerance, decreased balance, decreased coordination, decreased endurance, decreased mobility, difficulty walking, decreased ROM, decreased strength, hypomobility, increased edema, increased fascial restrictions, impaired perceived functional ability,  impaired flexibility, improper body mechanics, postural dysfunction, and pain.   ACTIVITY LIMITATIONS: carrying, lifting, bending, sitting, standing, squatting, sleeping, stairs, transfers, reach over head, and locomotion level  PARTICIPATION LIMITATIONS: meal prep, cleaning, laundry, interpersonal relationship, shopping, community activity, and yard work  PERSONAL FACTORS: Anemia, COPD, Depression, DVT history, GERD, Hep C, High blood pressure, High cholesterol, peripheral neuropathy. are also affecting patient's functional outcome.   REHAB POTENTIAL: Good  CLINICAL DECISION MAKING: Evolving/moderate complexity  EVALUATION COMPLEXITY: Moderate   GOALS: Goals reviewed with patient? Yes  SHORT TERM GOALS: (target date for Short term goals are 3 weeks 04/18/2024)   1.  Patient will demonstrate independent use of home exercise program to maintain progress from in clinic treatments.  Goal status: Met 04/19/2024  LONG TERM GOALS: (target dates for all long term goals are 10 weeks  06/06/2024 )   1. Patient will demonstrate/report pain at worst less than or equal to 2/10 to facilitate minimal limitation in daily activity secondary to pain symptoms.  Goal status: on going 04/19/2024   2. Patient will demonstrate independent use of home exercise program to facilitate ability to maintain/progress functional gains from skilled physical therapy services.  Goal status: on going 04/19/2024   3. Patient will demonstrate Patient specific functional scale avg > or = 8/10 to indicate reduced disability due to condition.   Goal status: on going 04/19/2024   4.  Patient will demonstrate Lt LE MMT 5/5 throughout to faciltiate usual transfers, stairs, squatting at [facility] for daily life.   Goal status: on going 04/19/2024   5.  Patient will demonstrate LRAD ambulation community distances > 500 ft.  Goal status: on going 04/19/2024   6.  Patient will demonstrate ascending/descending stairs  reciprocally s UE assist for community integration, getting on /off mower.   Goal status: on going 04/19/2024   7.  Patient will demonstrate TUG with LRAD < 14 seconds to indicate reduced fall risk  Goal Status: on going 04/19/2024   PLAN:  PT FREQUENCY: 1-2x/week  PT DURATION: 10 weeks  PLANNED INTERVENTIONS: Can include 02853- PT Re-evaluation, 97110-Therapeutic exercises, 97530- Therapeutic activity, 97112- Neuromuscular re-education, 97535- Self Care, 97140- Manual therapy, [phone]- Gait training, [phone]- Orthotic Fit/training, [phone]- Canalith repositioning, [id number]- Aquatic Therapy, [phone]- Electrical stimulation (unattended), [id number] Physical performance testing, 97016- Vasopneumatic device, [id number]- Ultrasound, [id number]- Traction (mechanical), [id number]- Ionotophoresis 4mg /ml Dexamethasone ,  [id number] - Needle insertion w/o injection 1 or 2 muscles, 20561 - Needle insertion w/o injection 3 or more muscles.   Patient/Family education, Balance training, Stair training, Taping, Dry Needling, Joint mobilization, Joint manipulation, Spinal manipulation, Spinal mobilization, Scar mobilization, Vestibular training, Visual/preceptual remediation/compensation, DME instructions, Cryotherapy, and Moist heat.  All performed as medically necessary.  All included  unless contraindicated  PLAN FOR NEXT SESSION: cane trial in clinic.   Ozell Silvan, PT, DPT, OCS, ATC 04/26/24  10:14 AM

## 2024-04-28 ENCOUNTER — Encounter: Payer: Self-pay | Admitting: Rehabilitative and Restorative Service Providers"

## 2024-04-28 ENCOUNTER — Ambulatory Visit: Admitting: Rehabilitative and Restorative Service Providers"

## 2024-04-28 DIAGNOSIS — M6281 Muscle weakness (generalized): Secondary | ICD-10-CM | POA: Diagnosis not present

## 2024-04-28 DIAGNOSIS — M25662 Stiffness of left knee, not elsewhere classified: Secondary | ICD-10-CM | POA: Diagnosis not present

## 2024-04-28 DIAGNOSIS — R262 Difficulty in walking, not elsewhere classified: Secondary | ICD-10-CM | POA: Diagnosis not present

## 2024-04-28 DIAGNOSIS — M25562 Pain in left knee: Secondary | ICD-10-CM

## 2024-04-28 DIAGNOSIS — R600 Localized edema: Secondary | ICD-10-CM

## 2024-04-28 DIAGNOSIS — G8929 Other chronic pain: Secondary | ICD-10-CM

## 2024-04-28 NOTE — Therapy (Addendum)
 " OUTPATIENT PHYSICAL THERAPY TREATMENT/ PROGRESS NOTE / DISCHARGE   Patient Name: Thomas Mcneil MRN: [id number] DOB:10-18-1950, 73 y.o., male Today's Date: 04/28/2024  Progress Note Reporting Period 03/28/2024 to 04/28/2024  See note below for Objective Data and Assessment of Progress/Goals.      END OF SESSION:  PT End of Session - 04/28/24 1048     Visit Number 7    Number of Visits 20    Date for Recertification  06/06/24    Authorization Type [employer] $30 copay    Progress Note Due on Visit 10    PT Start Time 1017    PT Stop Time 1055    PT Time Calculation (min) 38 min    Activity Tolerance Patient tolerated treatment well    Behavior During Therapy WFL for tasks assessed/performed                Past Medical History:  Diagnosis Date   Anemia    Arthritis    Complication of anesthesia    slow to wake up after last surgery   COPD (chronic obstructive pulmonary disease) (HCC)    Depression    DVT (deep venous thrombosis) (HCC)    GERD (gastroesophageal reflux disease)    Hepatitis    hep c   High blood pressure    High cholesterol    MGUS (monoclonal gammopathy of unknown significance)    Neuropathy    Peripheral neuropathy    feet and hands   Past Surgical History:  Procedure Laterality Date   DIRECT LARYNGOSCOPY  12/26/2011   Procedure: DIRECT LARYNGOSCOPY;  Surgeon: Merilee Kraft, MD;  Location: [facility] OR;  Service: ENT;  Laterality: N/A;  Directy Laryngoscopy with biopsy [id number]   DIRECT LARYNGOSCOPY N/A 10/21/2012   Procedure: DIRECT LARYNGOSCOPY;  Surgeon: Merilee Kraft, MD;  Location: [facility] OR;  Service: ENT;  Laterality: N/A;   ESOPHAGOGASTRODUODENOSCOPY N/A 10/25/2012   Procedure: ESOPHAGOGASTRODUODENOSCOPY (EGD);  Surgeon: Renaye Sous, MD;  Location: [facility] ENDOSCOPY;  Service: Endoscopy;  Laterality: N/A;   TONSILLECTOMY     TOTAL HIP ARTHROPLASTY Right 10/19/2023   Procedure: ARTHROPLASTY, HIP, TOTAL, ANTERIOR APPROACH;  Surgeon: Jerri Kay HERO,  MD;  Location: [facility] OR;  Service: Orthopedics;  Laterality: Right;   TOTAL KNEE ARTHROPLASTY Left 03/04/2024   Procedure: ARTHROPLASTY, KNEE, TOTAL;  Surgeon: Jerri Kay HERO, MD;  Location: [facility] ORS;  Service: Orthopedics;  Laterality: Left;   Patient Active Problem List   Diagnosis Date Noted   Status post total left knee replacement 03/04/2024   Primary osteoarthritis of left knee 02/12/2024   Status post total replacement of right hip 10/19/2023   Pressure injury of skin of left buttock 10/13/2023   Primary osteoarthritis of right hip 09/08/2023   Cocaine dependence, binge pattern (HCC) 11/26/2015   Substance induced mood disorder (HCC) 11/26/2015   Intention tremor 11/26/2015   Alcohol  use disorder, severe, dependence (HCC) 09/04/2015   Severe recurrent major depression without psychotic features (HCC) 09/04/2015   Alcohol  dependence, uncomplicated (HCC) 09/01/2015   Alcohol -induced mood disorder (HCC) 09/01/2015   Cocaine abuse (HCC) 09/01/2015   Alcohol  abuse 04/05/2014   Essential tremor 01/04/2014   Other and unspecified hyperlipidemia 01/04/2014   B12 deficiency 01/04/2014   Melena 10/23/2012   GERD 05/27/2010   SHOULDER PAIN, LEFT 05/21/2010   ONYCHOMYCOSIS, TOENAILS 05/09/2010   KNEE PAIN, BILATERAL 06/27/2009   LEG CRAMPS 06/27/2009   Contusion 08/18/2008   LEG EDEMA, BILATERAL 01/13/2008   ERECTILE DYSFUNCTION 10/15/2007  TOBACCO ABUSE 08/16/2007   Mononeuritis of lower limb 08/16/2007   CORNS AND CALLUSES 08/16/2007   ALCOHOL  ABUSE, HX OF 08/16/2007    PCP: Sigrid Kays, Sula MD  REFERRING PROVIDER: Jule Ronal LITTIE DEVONNA  REFERRING DIAG: [phone] (ICD-10-CM) - Status post total left knee replacement  THERAPY DIAG:  Chronic pain of left knee  Muscle weakness (generalized)  Stiffness of left knee  Difficulty in walking, not elsewhere classified  Localized edema  Rationale for Evaluation and Treatment: Rehabilitation  ONSET DATE: Lt TKA  03/04/2024  SUBJECTIVE:   SUBJECTIVE STATEMENT: Pt indicated no pain complaints.  Reported tightness with sitting longer.  Pt indicated about 85% back to normal with knee progression at this time.   PERTINENT HISTORY: Anemia, COPD, Depression, DVT history, GERD, Hep C, High blood pressure, High cholesterol, peripheral neuropathy.   PAIN:  NPRS scale: 0/10, 1/10 at worst in last day or so.  Pain location: Lt knee  Pain description: stiffness, achy.  Sharp pains at times.  Aggravating factors: prolonged walking,standing, nighttime.  Stiffness with sitting.  Relieving factors: Ice machine. Tylenol    PRECAUTIONS: None  WEIGHT BEARING RESTRICTIONS: No  FALLS:  Has patient fallen in last 6 months? No  LIVING ENVIRONMENT: Lives with: Lives with someone.  Lives in: House/apartment Stairs: has ramp.   Has following equipment at home: FWW, cane.  Elevated toilet seat, railings in bathroom.     OCCUPATION: No work.   PLOF: Independent.  Sit and watch TV, computer.   Has riding mowing.   Doesn't drive.   Usually does groceries shopping.   PATIENT GOALS: Reduce pain, improve walking.   OBJECTIVE:   PATIENT SURVEYS:  Patient-Specific Activity Scoring Scheme  0 represents unable to perform. 10 represents able to perform at prior level. 0 1 2 3 4 5 6 7 8 9  10 (Date and Score)   Activity Eval  03/28/2024 Did not gather due to patient difficulty in writing.  Will ask next viist 03/31/2024    Normal walking    6  2. Mowing yard   0   3.  Standing in house unassisted  6  4. Transfers (car)  3  5.    Score  3.75   Total score = sum of the activity scores/number of activities Minimum detectable change (90%CI) for average score = 2 points Minimum detectable change (90%CI) for single activity score = 3 points  COGNITION: 03/28/2024 Overall cognitive status: WFL    SENSATION: 03/28/2024 Not tested  EDEMA:  03/28/2024 Visual Lt leg edema from knee to ankle/lower  leg.   MUSCLE LENGTH: 03/28/2024 No specific testing today  POSTURE:  03/28/2024 Mild forward lean in standing, walking.  Tremors noted in UE control.   PALPATION: 03/28/2024 Mild tenderness around incision and anterior knee.   LOWER EXTREMITY ROM:   ROM Right Eval 03/28/2024 Left Eval 03/28/2024 Left 04/08/2024 Left 04/28/2024  Hip flexion      Hip extension      Hip abduction      Hip adduction      Hip internal rotation      Hip external rotation      Knee flexion  110 AROM in supine heel slide AROM in supine heel slide 120   Knee extension  -10 seated LAQ AROM   -5 in seated LAQ AROM -2 in seated LAQ  Ankle dorsiflexion      Ankle plantarflexion      Ankle inversion      Ankle eversion       (  Blank rows = not tested)  LOWER EXTREMITY MMT:  MMT Right Eval 03/28/2024 Left Eval 03/28/2024 Left 04/28/2024  Hip flexion 5/5 4+/5 5/5  Hip extension     Hip abduction     Hip adduction     Hip internal rotation     Hip external rotation     Knee flexion 5/5 5/5 5/5  Knee extension 5/5 4/5 5/5  Ankle dorsiflexion 5/5 5/5 5/5  Ankle plantarflexion     Ankle inversion     Ankle eversion      (Blank rows = not tested)  LOWER EXTREMITY SPECIAL TESTS:  03/28/2024 No specific testing.   FUNCTIONAL TESTS:  04/28/2024: 18 inch chair transfer s UE assist on 1st attempt.   03/28/2024 18 inch chair transfer: unable s UE assist.  Lt SLS: unable   TUG with rollator 17 seconds  GAIT: 04/28/2024: Able to perform SPC use in Rt UE CGA ambulation household distances.   03/28/2024 Ambulation in clinic distances with RWW with reduced gait speed, step length with Rt leg due to lack of stance and toe off progression on Lt leg.  Lacking TKE in stance.                                                                                                                                                                         TODAY'S TREATMENT                                                                           DATE:  04/28/2024 Therex: UBE LE only lvl 3.5 for ROM 6 mins , seat 14 (unsure of seat positioning) Incline gastroc stretch 30 sec x 3 bilateral  Review of HEP for continued gains.    Gait Training SPC use in Rt UE in clinic level surfaces 50 ft x 3 with CGA to min A at times.  Early cues for sequencing.  Incline/decline up/down x 1 each with cues for placement of weight and control.    TherActivity 6 inch step up forward with hands on bars lightly 2 x 10 bilaterally  18 inch chair transfer x 1 without UE assist.     TODAY'S TREATMENT  DATE:  04/26/2024 Therex: UBE LE only lvl 3.5 for ROM 6 mins (unsure of seat positioning) Incline gastroc stretch 30 sec x 3 bilaterally   Neuro Re-ed Tandem stance in // bars 1 min x 1 bilateral with SBA with moderate HHA touch down on bars in LOB.  Feet together stance 1 min with SBA Lateral stepping over 6 inch hurdle x 5 each way with occasional HHA on bar and close SBA to min A at times.    TherActivity (to improve stairs, standing, walking, transfers) Step up forward 6 inch step x 15 WB on Lt leg with hands lightly on bar Lateral step up/down 4 inch step x 15 WB on Lt leg with slow lowering focus, bilateral hands lightly on bar.   TODAY'S TREATMENT                                                                          DATE:  04/21/2024 Therex: Nustep Lvl 6 UE/LE for ROM, endurance 10 mins.  Neuro Re-ed Tandem stance in // bars 1 min x 1 bilateral with SBA with moderate HHA touch down on bars in LOB.  Forward stepping weight shift in // bars with SBA to min A at times x 5 each LE Reverse stepping weight shift in // bars with SBA to min A at times x 5 each LE   TherActivity (to improve stairs, standing, walking, transfers) Leg press double leg 100 lbs x 15 slow lowering focus  Leg press single leg 50 lbs x 15 slow  lowering focus, performed bilaterally  Sit to stand to sit transfer s UE assist x 5 throughout visit.    TODAY'S TREATMENT                                                                          DATE:  04/19/2024 Therex: Nustep Lvl 6 UE/LE for ROM, endurance 8 mins.  Seated Lt leg LAQ 4 lb weight 2 x 15 with end range pauses each direction.  Supine SAQ black triangle wedge under knee .  Performed bilaterally concurrently 5 sec hold x 15 Extension heel prop start of vaso to tolerance - 4 mins   Neuro Re-ed Feet together stance 1 min with SBA Modified tandem stance (foot forward only ) 30 sec x 2 bilateral with SBA to min A at times.    TherActivity (to improve stairs, standing, walking, transfers) Leg press double leg 75 lbs x 15 slow lowering focus  Leg press single leg 37 lbs x 15 slow lowering focus, performed bilaterally   Vaso 10 mins medium compression in elevation Lt leg 34 deg  with TKE stretch start of activity.   PATIENT EDUCATION:  Education details: HEP, POC Person educated: Patient Education method: Programmer, Multimedia, Demonstration, Verbal cues, and Handouts Education comprehension: verbalized understanding, returned demonstration, and verbal cues required  HOME EXERCISE PROGRAM: Access Code: [email or web address] URL: [email or web address] Date: 03/28/2024 Prepared by: Ozell Silvan  Exercises - Supine  Heel Slide  - 3-5 x daily - 7 x weekly - 1 sets - 10 reps - 5 hold - Supine Heel Slide with Strap  - 3-5 x daily - 7 x weekly - 1 sets - 10 reps - 5 hold - Seated Long Arc Quad  - 3-5 x daily - 7 x weekly - 1 sets - 5-10 reps - 2 hold - Seated Quad Set (Mirrored)  - 3-5 x daily - 7 x weekly - 1 sets - 10 reps - 5 hold - Quad Setting and Stretching  - 3-5 x daily - 7 x weekly - 1 sets - 10 reps - 5 hold - Supine Knee Extension Mobilization with Weight (Mirrored)  - 4-5 x daily - 7 x weekly - 1 sets - 1 reps - to tolerance up to 5 mins hold  ASSESSMENT:  CLINICAL  IMPRESSION: The patient has attended 7 visits over the course of treatment cycle.  Patient has reported overall improvement at 85%.  See objective data above for updated information regarding current presentation.  Lt knee presentation is great with meeting goals related to mobility, strength.  Ambulation impacted from previous medical condition so walker/rollator safest at this time.  Pt indicated desire to trial HEP at this time due to improvements.      OBJECTIVE IMPAIRMENTS: Abnormal gait, decreased activity tolerance, decreased balance, decreased coordination, decreased endurance, decreased mobility, difficulty walking, decreased ROM, decreased strength, hypomobility, increased edema, increased fascial restrictions, impaired perceived functional ability, impaired flexibility, improper body mechanics, postural dysfunction, and pain.   ACTIVITY LIMITATIONS: carrying, lifting, bending, sitting, standing, squatting, sleeping, stairs, transfers, reach over head, and locomotion level  PARTICIPATION LIMITATIONS: meal prep, cleaning, laundry, interpersonal relationship, shopping, community activity, and yard work  PERSONAL FACTORS: Anemia, COPD, Depression, DVT history, GERD, Hep C, High blood pressure, High cholesterol, peripheral neuropathy. are also affecting patient's functional outcome.   REHAB POTENTIAL: Good  CLINICAL DECISION MAKING: Evolving/moderate complexity  EVALUATION COMPLEXITY: Moderate   GOALS: Goals reviewed with patient? Yes  SHORT TERM GOALS: (target date for Short term goals are 3 weeks 04/18/2024)   1.  Patient will demonstrate independent use of home exercise program to maintain progress from in clinic treatments.  Goal status: Met 04/19/2024  LONG TERM GOALS: (target dates for all long term goals are 10 weeks  06/06/2024 )   1. Patient will demonstrate/report pain at worst less than or equal to 2/10 to facilitate minimal limitation in daily activity secondary to  pain symptoms.  Goal status: Met 04/28/2024   2. Patient will demonstrate independent use of home exercise program to facilitate ability to maintain/progress functional gains from skilled physical therapy services.  Goal status:Met 04/28/2024   3. Patient will demonstrate Patient specific functional scale avg > or = 8/10 to indicate reduced disability due to condition.   Goal status: on going 04/19/2024   4.  Patient will demonstrate Lt LE MMT 5/5 throughout to faciltiate usual transfers, stairs, squatting at [facility] for daily life.   Goal status: Met 04/28/2024   5.  Patient will demonstrate LRAD ambulation community distances > 500 ft.  Goal status: Met 04/28/2024 - able with walker   6.  Patient will demonstrate ascending/descending stairs reciprocally s UE assist for community integration, getting on /off mower.   Goal status: partially Met 04/28/2024 - UE required   7.  Patient will demonstrate TUG with LRAD < 14 seconds to indicate reduced fall risk  Goal Status: on going 04/19/2024   PLAN:  PT FREQUENCY: 1-2x/week  PT DURATION: 10 weeks  PLANNED INTERVENTIONS: Can include 02853- PT Re-evaluation, 97110-Therapeutic exercises, 97530- Therapeutic activity, [id number]- Neuromuscular re-education, 97535- Self Care, 97140- Manual therapy, [phone]- Gait training, [phone]- Orthotic Fit/training, [phone]- Canalith repositioning, [id number]- Aquatic Therapy, [phone]- Electrical stimulation (unattended), [id number] Physical performance testing, 97016- Vasopneumatic device, [id number]- Ultrasound, [id number]- Traction (mechanical), [id number]- Ionotophoresis 4mg /ml Dexamethasone ,  79439 - Needle insertion w/o injection 1 or 2 muscles, 20561 - Needle insertion w/o injection 3 or more muscles.   Patient/Family education, Balance training, Stair training, Taping, Dry Needling, Joint mobilization, Joint manipulation, Spinal manipulation, Spinal mobilization, Scar mobilization, Vestibular training, Visual/preceptual  remediation/compensation, DME instructions, Cryotherapy, and Moist heat.  All performed as medically necessary.  All included unless contraindicated  PLAN FOR NEXT SESSION: Return to MD for follow up, trial HEP per Pt request due to financial cost, progress.    Ozell Silvan, PT, DPT, OCS, ATC 04/28/24  11:05 AM   PHYSICAL THERAPY DISCHARGE SUMMARY  Visits from Start of Care: 7  Current functional level related to goals / functional outcomes: See note   Remaining deficits: See note   Education / Equipment: HEP  Patient goals were partially met. Patient is being discharged due to not returning since the last visit.    Ozell Silvan, PT, DPT, OCS, ATC 07/13/24  11:06 AM      "

## 2024-05-10 ENCOUNTER — Ambulatory Visit: Admitting: Physician Assistant

## 2024-05-31 ENCOUNTER — Ambulatory Visit: Admitting: Orthopaedic Surgery

## 2024-05-31 DIAGNOSIS — Z96652 Presence of left artificial knee joint: Secondary | ICD-10-CM

## 2024-05-31 NOTE — Progress Notes (Signed)
 "  Post-Op Visit Note   Patient: Thomas Mcneil           Date of Birth: 30-Aug-1950           MRN: [id number] Visit Date: 05/31/2024 PCP: Sigrid Deidra Fox, MD   Assessment & Plan:  Chief Complaint:  Chief Complaint  Patient presents with   Left Knee - Pain   Visit Diagnoses:  1. Status post total left knee replacement     Plan: History of Present Illness Thomas Mcneil is a 73 year old male who presents for three month follow-up after left total knee arthroplasty.  He is three months status post left total knee arthroplasty with recovery better than expected.  He has persistent swelling of the left leg with associated pain and is concerned about fluid accumulation. He previously used a medication for this but does not recall which one.  Physical Exam MUSCULOSKELETAL: Left knee with great flexibility.  Fully healed surgical scar.  Assessment and Plan Status post total left knee replacement Three months post-surgery with well-healed site and excellent flexibility. No acute complications. Satisfied with outcome. - Scheduled follow-up in three months.  Chronic lymphedema of the lower extremity Chronic lymphedema in both legs with increased swelling and discomfort in left leg, likely exacerbated by knee arthroplasty. Poor circulation noted. Risk for ulceration or serous drainage without management. - Prescribed daily use of compression socks. - Advised to obtain two pairs of compression socks to alternate use. - Provided education on daily compression to prevent lymphedema progression and complications.  Follow-Up Instructions: Return in about 3 months (around 08/29/2024) for with lindsey.   Orders:  No orders of the defined types were placed in this encounter.  No orders of the defined types were placed in this encounter.   Imaging: No results found.  PMFS History: Patient Active Problem List   Diagnosis Date Noted   Status post total left knee replacement  03/04/2024   Primary osteoarthritis of left knee 02/12/2024   Status post total replacement of right hip 10/19/2023   Pressure injury of skin of left buttock 10/13/2023   Primary osteoarthritis of right hip 09/08/2023   Cocaine dependence, binge pattern (HCC) 11/26/2015   Substance induced mood disorder (HCC) 11/26/2015   Intention tremor 11/26/2015   Alcohol  use disorder, severe, dependence (HCC) 09/04/2015   Severe recurrent major depression without psychotic features (HCC) 09/04/2015   Alcohol  dependence, uncomplicated (HCC) 09/01/2015   Alcohol -induced mood disorder (HCC) 09/01/2015   Cocaine abuse (HCC) 09/01/2015   Alcohol  abuse 04/05/2014   Essential tremor 01/04/2014   Other and unspecified hyperlipidemia 01/04/2014   B12 deficiency 01/04/2014   Melena 10/23/2012   GERD 05/27/2010   SHOULDER PAIN, LEFT 05/21/2010   ONYCHOMYCOSIS, TOENAILS 05/09/2010   KNEE PAIN, BILATERAL 06/27/2009   LEG CRAMPS 06/27/2009   Contusion 08/18/2008   LEG EDEMA, BILATERAL 01/13/2008   ERECTILE DYSFUNCTION 10/15/2007   TOBACCO ABUSE 08/16/2007   Mononeuritis of lower limb 08/16/2007   CORNS AND CALLUSES 08/16/2007   ALCOHOL  ABUSE, HX OF 08/16/2007   Past Medical History:  Diagnosis Date   Anemia    Arthritis    Complication of anesthesia    slow to wake up after last surgery   COPD (chronic obstructive pulmonary disease) (HCC)    Depression    DVT (deep venous thrombosis) (HCC)    GERD (gastroesophageal reflux disease)    Hepatitis    hep c   High blood pressure    High cholesterol  MGUS (monoclonal gammopathy of unknown significance)    Neuropathy    Peripheral neuropathy    feet and hands    Family History  Problem Relation Age of Onset   Hypertension Mother    Diverticulosis Mother    GER disease Mother    Lung disease Sister    Polymyositis Sister    Alcohol  abuse Maternal Uncle     Past Surgical History:  Procedure Laterality Date   DIRECT LARYNGOSCOPY  12/26/2011    Procedure: DIRECT LARYNGOSCOPY;  Surgeon: Merilee Kraft, MD;  Location: [facility] OR;  Service: ENT;  Laterality: N/A;  Directy Laryngoscopy with biopsy [id number]   DIRECT LARYNGOSCOPY N/A 10/21/2012   Procedure: DIRECT LARYNGOSCOPY;  Surgeon: Merilee Kraft, MD;  Location: [facility] OR;  Service: ENT;  Laterality: N/A;   ESOPHAGOGASTRODUODENOSCOPY N/A 10/25/2012   Procedure: ESOPHAGOGASTRODUODENOSCOPY (EGD);  Surgeon: Renaye Sous, MD;  Location: [facility] ENDOSCOPY;  Service: Endoscopy;  Laterality: N/A;   TONSILLECTOMY     TOTAL HIP ARTHROPLASTY Right 10/19/2023   Procedure: ARTHROPLASTY, HIP, TOTAL, ANTERIOR APPROACH;  Surgeon: Jerri Kay HERO, MD;  Location: [facility] OR;  Service: Orthopedics;  Laterality: Right;   TOTAL KNEE ARTHROPLASTY Left 03/04/2024   Procedure: ARTHROPLASTY, KNEE, TOTAL;  Surgeon: Jerri Kay HERO, MD;  Location: [facility] ORS;  Service: Orthopedics;  Laterality: Left;   Social History   Occupational History   Not on file  Tobacco Use   Smoking status: Some Days    Current packs/day: 0.25    Average packs/day: 0.3 packs/day for 46.4 years (11.6 ttl pk-yrs)    Types: Cigarettes    Start date: 06/09/2012   Smokeless tobacco: Never   Tobacco comments:    quitting, using e-cig    05/12/2022- smokes on occasion  Vaping Use   Vaping status: Former  Substance and Sexual Activity   Alcohol  use: Not Currently    Alcohol /week: 70.0 standard drinks of alcohol     Types: 70 Cans of beer per week    Comment: [place] - currently in [facility] for counseling/rehab. last drink 06/29/14   Drug use: Not Currently    Frequency: 0.2 times per week    Types: Crack cocaine    Comment: Crack quit 06/29/14   Sexual activity: Not on file     "

## 2024-08-30 ENCOUNTER — Ambulatory Visit: Admitting: Physician Assistant
